# Patient Record
Sex: Female | Born: 1955 | Race: White | Hispanic: No | Marital: Married | Smoking: Never smoker
Health system: Southern US, Community
[De-identification: ages and names within clinical notes are randomized; demographics above are authoritative.]

## PROBLEM LIST (undated history)

## (undated) DIAGNOSIS — M1990 Unspecified osteoarthritis, unspecified site: Secondary | ICD-10-CM

---
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
## 1978-08-08 HISTORY — PX: TONSILLECTOMY: SUR1361

## 1988-08-08 HISTORY — PX: DILATION AND CURETTAGE OF UTERUS: SHX78

## 2008-01-07 ENCOUNTER — Other Ambulatory Visit: Admission: RE | Admit: 2008-01-07 | Discharge: 2008-01-07 | Payer: Self-pay | Admitting: Family Medicine

## 2008-01-07 ENCOUNTER — Encounter: Payer: Self-pay | Admitting: Family Medicine

## 2008-01-07 ENCOUNTER — Ambulatory Visit: Payer: Self-pay | Admitting: Family Medicine

## 2008-01-07 DIAGNOSIS — M48061 Spinal stenosis, lumbar region without neurogenic claudication: Secondary | ICD-10-CM | POA: Insufficient documentation

## 2008-01-07 DIAGNOSIS — M545 Low back pain: Secondary | ICD-10-CM

## 2008-01-07 DIAGNOSIS — M5136 Other intervertebral disc degeneration, lumbar region: Secondary | ICD-10-CM | POA: Insufficient documentation

## 2008-01-10 ENCOUNTER — Encounter: Admission: RE | Admit: 2008-01-10 | Discharge: 2008-01-10 | Payer: Self-pay | Admitting: Family Medicine

## 2008-01-16 ENCOUNTER — Telehealth: Payer: Self-pay | Admitting: Family Medicine

## 2008-01-24 ENCOUNTER — Encounter: Payer: Self-pay | Admitting: Family Medicine

## 2008-01-24 LAB — CONVERTED CEMR LAB
ALT: <8 units/L (ref 0–35)
CO2: 28 meq/L (ref 19–32)
Cholesterol: 161 mg/dL (ref 0–200)
Creatinine, Ser: 0.68 mg/dL (ref 0.40–1.20)
Total Bilirubin: 0.6 mg/dL (ref 0.3–1.2)
Total CHOL/HDL Ratio: 2.3
Triglycerides: 78 mg/dL (ref <150)
VLDL: 16 mg/dL (ref 0–40)

## 2008-01-28 ENCOUNTER — Encounter: Payer: Self-pay | Admitting: Family Medicine

## 2008-12-18 ENCOUNTER — Ambulatory Visit: Payer: Self-pay | Admitting: Family Medicine

## 2009-02-18 ENCOUNTER — Ambulatory Visit: Payer: Self-pay | Admitting: Family Medicine

## 2009-02-18 ENCOUNTER — Encounter: Admission: RE | Admit: 2009-02-18 | Discharge: 2009-02-18 | Payer: Self-pay | Admitting: Family Medicine

## 2009-02-18 ENCOUNTER — Other Ambulatory Visit: Admission: RE | Admit: 2009-02-18 | Discharge: 2009-02-18 | Payer: Self-pay | Admitting: Family Medicine

## 2009-02-18 ENCOUNTER — Encounter: Payer: Self-pay | Admitting: Family Medicine

## 2009-02-19 ENCOUNTER — Encounter: Payer: Self-pay | Admitting: Family Medicine

## 2009-02-22 LAB — CONVERTED CEMR LAB
Alkaline Phosphatase: 77 units/L (ref 39–117)
BUN: 21 mg/dL (ref 6–23)
Creatinine, Ser: 0.72 mg/dL (ref 0.40–1.20)
Glucose, Bld: 95 mg/dL (ref 70–99)
HDL: 65 mg/dL (ref >39)
LDL Cholesterol: 100 mg/dL — ABNORMAL HIGH (ref 0–99)
Sodium: 143 meq/L (ref 135–145)
Total Bilirubin: 0.4 mg/dL (ref 0.3–1.2)
Total CHOL/HDL Ratio: 2.8
Triglycerides: 97 mg/dL (ref <150)
VLDL: 19 mg/dL (ref 0–40)

## 2009-02-23 ENCOUNTER — Encounter: Admission: RE | Admit: 2009-02-23 | Discharge: 2009-02-23 | Payer: Self-pay | Admitting: Family Medicine

## 2009-03-17 ENCOUNTER — Encounter: Payer: Self-pay | Admitting: Family Medicine

## 2009-03-18 ENCOUNTER — Ambulatory Visit: Payer: Self-pay | Admitting: Obstetrics & Gynecology

## 2009-03-19 ENCOUNTER — Encounter: Admission: RE | Admit: 2009-03-19 | Discharge: 2009-03-19 | Payer: Self-pay | Admitting: Obstetrics & Gynecology

## 2009-03-25 ENCOUNTER — Ambulatory Visit: Payer: Self-pay | Admitting: Obstetrics & Gynecology

## 2009-07-12 ENCOUNTER — Ambulatory Visit: Payer: Self-pay | Admitting: Family Medicine

## 2010-03-09 ENCOUNTER — Ambulatory Visit: Payer: Self-pay | Admitting: Obstetrics & Gynecology

## 2010-03-16 ENCOUNTER — Encounter: Admission: RE | Admit: 2010-03-16 | Discharge: 2010-03-16 | Payer: Self-pay | Admitting: Obstetrics & Gynecology

## 2010-05-27 ENCOUNTER — Encounter: Payer: Self-pay | Admitting: Family Medicine

## 2010-06-01 ENCOUNTER — Ambulatory Visit: Payer: Self-pay | Admitting: Family Medicine

## 2010-06-01 ENCOUNTER — Encounter: Admission: RE | Admit: 2010-06-01 | Discharge: 2010-06-01 | Payer: Self-pay | Admitting: Family Medicine

## 2010-06-02 ENCOUNTER — Encounter: Payer: Self-pay | Admitting: Family Medicine

## 2010-06-03 LAB — CONVERTED CEMR LAB
AST: 18 units/L (ref 0–37)
Albumin: 4.4 g/dL (ref 3.5–5.2)
Alkaline Phosphatase: 83 units/L (ref 39–117)
HDL: 56 mg/dL (ref >39)
LDL Cholesterol: 100 mg/dL — ABNORMAL HIGH (ref 0–99)
MCHC: 32 g/dL (ref 30.0–36.0)
Potassium: 4.6 meq/L (ref 3.5–5.3)
RDW: 13.6 % (ref 11.5–15.5)
Sodium: 141 meq/L (ref 135–145)
TSH: 1.154 microintl units/mL (ref 0.350–4.500)
Total Bilirubin: 0.5 mg/dL (ref 0.3–1.2)
Total Protein: 6.6 g/dL (ref 6.0–8.3)
VLDL: 27 mg/dL (ref 0–40)

## 2010-08-12 ENCOUNTER — Ambulatory Visit
Admission: RE | Admit: 2010-08-12 | Discharge: 2010-08-12 | Payer: Self-pay | Source: Home / Self Care | Attending: Family Medicine | Admitting: Family Medicine

## 2010-08-12 DIAGNOSIS — R519 Headache, unspecified: Secondary | ICD-10-CM | POA: Insufficient documentation

## 2010-08-12 DIAGNOSIS — R51 Headache: Secondary | ICD-10-CM | POA: Insufficient documentation

## 2010-09-07 NOTE — Assessment & Plan Note (Signed)
Summary: CPE   Vital Signs:  Patient profile:   55 year old female Height:      64.5 inches Weight:      185 pounds BMI:     31.38 Pulse rate:   98 / minute BP sitting:   126 / 81  (right arm) Cuff size:   regular  Vitals Entered By: Avon Gully CMA, Duncan Dull) (June 01, 2010 2:05 PM) CC: CPE   CC:  CPE.  Current Medications (verified): 1)  One A Day Vitamin .... Once A Day 2)  Alieve .... Once A Day 3)  Calcium + Vitamin D .... Once A Day 4)  Fish Oil .... Once A Day  Allergies (verified): No Known Drug Allergies  Comments:  Nurse/Medical Assistant: The patient's medications and allergies were reviewed with the patient and were updated in the Medication and Allergy Lists. Avon Gully CMA, Duncan Dull) (June 01, 2010 2:05 PM)  Past History:  Past Medical History: Last updated: 12/18/2008 None Unremarkable  Past Surgical History: Last updated: 01/07/2008 Appendectomy 1968 Tonsillectomy 1981 D & C miscarriage 1990  Family History: Reviewed history from 12/18/2008 and no changes required. 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  Married to [employer] with teenage daughter.   Never Smoked Alcohol use-yes Drug use-no Caffeine - occassionally but not daily No regular exercise.   Review of Systems       CP intermittant for the last 8 month.  Get a sense of struggling to breath (like a panic attack) and then feels nervous. Never had panic attacks. Chest will feel uncomfortable but not painful. Has happened twice. Lasted maybe 10 min. Once happened at night and woke her up and once while hiking.  Feels she is overwight and out of shape.   Physical Exam  General:  Well-developed,well-nourished,in no acute distress; alert,appropriate and cooperative throughout examination Head:  Normocephalic and atraumatic without obvious abnormalities.  No apparent alopecia or balding. Eyes:  No corneal or conjunctival inflammation noted. EOMI. Perrla.  Ears:  External ear exam shows no significant lesions or deformities.  Otoscopic examination reveals clear canals, tympanic membranes are intact bilaterally without bulging, retraction, inflammation or discharge. Hearing is grossly normal bilaterally. Nose:  External nasal examination shows no deformity or inflammation. Nasal mucosa are pink and moist without lesions or exudates. Mouth:  Oral mucosa and oropharynx without lesions or exudates.  Teeth in good repair. Neck:  No deformities, masses, or tenderness noted. Chest Wall:  No deformities, masses, or tenderness noted. Lungs:  Normal respiratory effort, chest expands symmetrically. Lungs are clear to auscultation, no crackles or wheezes. Heart:  Normal rate and regular rhythm. S1 and S2 normal without gallop, murmur, click, rub or other extra sounds. Abdomen:  Bowel sounds positive,abdomen soft and non-tender without masses, organomegaly or hernias noted. Msk:  No deformity or scoliosis noted of thoracic or lumbar spine.   Pulses:  R and L carotid,radial,dorsalis pedis and posterior tibial pulses are full and equal bilaterally Extremities:  No clubbing, cyanosis, edema, or deformity noted with normal full range of motion of all joints.   Neurologic:  No cranial nerve deficits noted. Station and gait are normal. DTRs are symmetrical throughout. Sensory, motor and coordinative functions appear intact. Skin:  no rashes.   Cervical Nodes:  No lymphadenopathy noted Psych:  Cognition and judgment appear intact. Alert and cooperative with normal attention span and concentration.  No apparent delusions, illusions, hallucinations   Impression & Recommendations:  Problem # 1:  PHYSICAL EXAMINATION (ICD-V70.0) Exam is normal today. Encouraged more regular exercise Due for screening labs.  Her mammo and pelvic exam are upt to date.   Orders: T-Comprehensive Metabolic Panel [id number]) T-Lipid Profile [id number]) T-TSH [phone])  Problem # 2:  CHEST PAIN, ATYPICAL (ICD-786.59) EKG is very reassuring. Rate of 63 bpm, no acute changes. Likely noncardiac. Does sound more consistant with axiety. Very low risk for heart disease.   Call if occurs again.  Orders: T-Comprehensive Metabolic Panel [id number]) T-Lipid Profile [phone]) T-TSH [id number]) EKG w/ Interpretation (93000) T-CBC No Diff ([id number])  Complete Medication List: 1)  One A Day Vitamin  .... Once a day 2)  Alieve  .... Once a day 3)  Calcium + Vitamin D  .... Once a day 4)  Fish Oil  .... Once a day  Other Orders: T-Dual DXA Bone Density/ Axial ([id number])  Patient Instructions: 1)  It is important that you exercise reguarly at least 20 minutes 5 times a week. If you develop chest pain, have severe difficulty breathing, or feel very tired, stop exercising immediately and seek medical attention.  2)  Continue your daily calcium.   Contraindications/Deferment of Procedures/Staging:    Test/Procedure: FLU VAX    Reason for deferment: patient declined    Orders Added: 1)  Est. Patient age 62-64 [60] 2)  Est. Patient Level II [99212] 3)  T-Comprehensive Metabolic Panel [[id number]] 4)  T-Lipid Profile [[id number]] 5)  T-TSH [[id number]] 6)  EKG w/ Interpretation [93000] 7)  T-Dual DXA Bone Density/ Axial [77080] 8)  T-CBC No Diff [[id number]]    Flex Sig Next Due:  Not Indicated Hemoccult Next Due:  Not Indicated Last Mammogram:  ASSESSMENT: Negative - BI-RADS 1^MM DIGITAL SCREENING (03/16/2010 1:24:00 PM) Mammogram Result Date:  03/16/2010 Mammogram Result:  normal Mammogram Next Due:  1 yr

## 2010-09-09 NOTE — Assessment & Plan Note (Signed)
Summary: Headache, nausea   Vital Signs:  Patient profile:   55 year old female Height:      64.5 inches Weight:      186 pounds Temp:     97.4 degrees F oral Pulse rate:   55 / minute BP sitting:   124 / 79  (right arm) Cuff size:   regular  Vitals Entered By: Avon Gully CMA, Duncan Dull) (August 12, 2010 2:31 PM) CC: HA on left side since tues, neck pain on left side , dizziness, nausea, Headache   Primary Care Provider:  Nani Gasser MD  CC:  HA on left side since tues, neck pain on left side , dizziness, nausea, and Headache.  History of Present Illness: HA on left side since tues, neck pain on left side ,dizziness, nausea. NO vomiting. Pain is more pressure.  No nasal congestion. Pain is over the left eye and ear and into her neck. NO injury.  No fever.  Hx of migraines but hasn't had one in about 25 years.  Yesterday had vertigo, today is some better. Started when she tried ot get out of bed.  Took Advil yesteray and it didn't help. Hx of meneires years ago.  No hearing loss.   Current Medications (verified): 1)  One A Day Vitamin .... Once A Day 2)  Alieve .... Once A Day 3)  Calcium-Vitamin D 500-200 Mg-Unit Tabs (Calcium Carbonate-Vitamin D) .... Take 1 Tablet By Mouth Two Times A Day 4)  Fish Oil .... Once A Day  Allergies (verified): No Known Drug Allergies  Comments:  Nurse/Medical Assistant: The patient's medications and allergies were reviewed with the patient and were updated in the Medication and Allergy Lists. 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  Married to [employer] with  teenage daughter.   Never Smoked Alcohol use-yes Drug use-no Caffeine - occassionally but not daily No regular exercise.   Physical Exam  General:  Well-developed,well-nourished,in no acute distress; alert,appropriate and cooperative throughout examination Head:  Normocephalic and atraumatic without obvious abnormalities. No apparent alopecia or balding. Eyes:  No corneal or conjunctival inflammation noted. EOMI. Perrla. Ears:  External ear exam shows no significant lesions or deformities.  Otoscopic examination reveals clear canals, tympanic membranes are intact bilaterally without bulging, retraction, inflammation or discharge. Hearing is grossly normal bilaterally. Nose:  External nasal examination shows no deformity or inflammation.  Mouth:  Oral mucosa and oropharynx without lesions or exudates.  Teeth in good repair. Neck:  No deformities, masses, or tenderness noted. Lungs:  Normal respiratory effort, chest expands symmetrically. Lungs are clear to auscultation, no crackles or wheezes. Heart:  Normal rate and regular rhythm. S1 and S2 normal without gallop, murmur, click, rub or other extra sounds. Skin:  no rashes.   Cervical Nodes:  No lymphadenopathy noted Psych:  Cognition and judgment appear intact. Alert and cooperative with normal attention span and concentration. No apparent delusions, illusions, hallucinations   Impression & Recommendations:  Problem # 1:  HEADACHE (ICD-784.0) Alsmost sounds like a migraine except has the vertigo with it. Not menieres as she has not had hearing loss with it. Also not just BPV.  Neg  Diks-Hallpike and her vertigo is actually better. I will given her an antibiotic and tx her for sinusitis. If not better by next week please call the office and let me know. If needs somthing stronger for pain then let me know.  Ear appears normal but if starts draining or fever then let me know.  Consider vertiginous migraine.   Problem # 2:  VERTIGO  (ICD-780.4) Patient to call to be seen if no improvement in 10-14 days, sooner if worse.   Complete Medication List: 1)  One A Day Vitamin  .... Once a day 2)  Alieve  .... Once a day 3)  Calcium-vitamin D 500-200 Mg-unit Tabs (Calcium carbonate-vitamin d) .... Take 1 tablet by mouth two times a day 4)  Fish Oil  .... Once a day 5)  Amoxicillin 875 Mg Tabs (Amoxicillin) .... Take 1 tablet by mouth two times a day for 10 day  Patient Instructions: 1)  Possibly vestibulitis 2)  Call if not better in 1-2 weeks.  Prescriptions: AMOXICILLIN 875 MG TABS (AMOXICILLIN) Take 1 tablet by mouth two times a day for 10 day  #20 x 0   Entered and Authorized by:   Nani Gasser MD   Signed by:   Nani Gasser MD on 08/12/2010   Method used:   Electronically to        [facility] [phone]* (retail)       [address] [place], [place]  [address]       Ph: [phone]       Fax: [phone]   RxID:   [id number]    Orders Added: 1)  Est. Patient Level IV [27253]

## 2010-12-21 NOTE — Assessment & Plan Note (Signed)
Becky Bowers, Becky Bowers              ACCOUNT NO.:  [id number]   MEDICAL RECORD NO.:  [id number]          PATIENT TYPE:  POB   LOCATION:  [facility]         FACILITY:  [facility]   PHYSICIAN:  Allie Bossier, MD        DATE OF BIRTH:  [birth year]   DATE OF SERVICE:                                  CLINIC NOTE   HISTORY OF PRESENT ILLNESS:  Becky Bowers is a 55 year old married white  G2, P1, A1.  She has a 31 year old daughter.  She comes in today for  annual exam.  She has no GYN complaints.   PAST MEDICAL HISTORY:  None.   MEDICATIONS:  None.   REVIEW OF SYSTEMS:  She is married for the last 23 years and she and her  husband (who has had 2 heart attacks) no longer have sex.  She says this  is not an issue for their relationship.  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  She was menopausal at age 17 and is  asymptomatic.  Her last mammogram was July of last year and her last Pap  smear was July of last year at which time endometrial cells were seen  and she had a negative endometrial biopsy done here in August 2010.   PAST SURGICAL HISTORY:  She had a D and C after her miscarriage.  She  has had an appendectomy and tonsillectomy.   FAMILY HISTORY:  Negative for breast and colon malignancy but a paternal  grandmother did have colon cancer.   PHYSICAL EXAMINATION:  GENERAL/VITAL SIGNS:  Well-nourished, well-  hydrated pleasant white female, height 5 feet 6 inches, weight 183,  blood pressure 123/78, pulse 67.  HEENT:  Normal.  HEART:  Regular rate and rhythm.  LUNGS:  Clear to auscultation bilaterally.  Breasts exam normal  bilaterally.  ABDOMEN:  No palpable hepatosplenomegaly.  EXTERNAL GENITALIA:  No lesions.  Cervix parous, no lesions.  Uterus  normal size and shape, anteverted.  Adnexa nontender.  No masses.   ASSESSMENT/PLAN:  Annual exam.  Recommended self-breast and self-vulvar  exams monthly.  I am scheduling a mammogram after a Pap smear today.  I  have also recommended weight loss and she agrees.      Allie Bossier, MD    MCD/MEDQ  D:  03/09/2010  T:  03/10/2010  Job:  [id number]

## 2011-03-29 ENCOUNTER — Other Ambulatory Visit: Payer: Self-pay | Admitting: Family Medicine

## 2011-03-29 ENCOUNTER — Other Ambulatory Visit: Payer: Self-pay | Admitting: Obstetrics & Gynecology

## 2011-03-29 DIAGNOSIS — Z1231 Encounter for screening mammogram for malignant neoplasm of breast: Secondary | ICD-10-CM

## 2011-04-06 ENCOUNTER — Ambulatory Visit: Payer: Self-pay

## 2011-04-19 ENCOUNTER — Ambulatory Visit
Admission: RE | Admit: 2011-04-19 | Discharge: 2011-04-19 | Disposition: A | Discharge status: Home / Self Care | Payer: BC Managed Care – PPO | Source: Ambulatory Visit | Attending: Family Medicine | Admitting: Family Medicine

## 2011-04-19 DIAGNOSIS — Z1231 Encounter for screening mammogram for malignant neoplasm of breast: Secondary | ICD-10-CM

## 2011-04-25 ENCOUNTER — Telehealth: Payer: Self-pay | Admitting: *Deleted

## 2011-04-25 NOTE — Telephone Encounter (Signed)
Message copied by Lanae Crumbly on Mon Apr 25, 2011  9:04 AM ------      Message from: Nani Gasser D      Created: Thu Apr 21, 2011  5:05 PM       Please call patient. Normal mammogram.  Repeat in 1 year.

## 2011-04-25 NOTE — Telephone Encounter (Signed)
Pt notified of reults. KJ LPN

## 2011-05-04 ENCOUNTER — Ambulatory Visit (INDEPENDENT_AMBULATORY_CARE_PROVIDER_SITE_OTHER): Payer: BC Managed Care – PPO | Admitting: Obstetrics & Gynecology

## 2011-05-04 ENCOUNTER — Encounter: Payer: Self-pay | Admitting: Obstetrics & Gynecology

## 2011-05-04 VITALS — BP 110/66 | HR 53 | Ht 66.0 in | Wt 135.0 lb

## 2011-05-04 DIAGNOSIS — K629 Disease of anus and rectum, unspecified: Secondary | ICD-10-CM

## 2011-05-04 DIAGNOSIS — Z113 Encounter for screening for infections with a predominantly sexual mode of transmission: Secondary | ICD-10-CM

## 2011-05-04 DIAGNOSIS — Z1272 Encounter for screening for malignant neoplasm of vagina: Secondary | ICD-10-CM

## 2011-05-04 DIAGNOSIS — K6289 Other specified diseases of anus and rectum: Secondary | ICD-10-CM

## 2011-05-04 DIAGNOSIS — Z0000 Encounter for general adult medical examination without abnormal findings: Secondary | ICD-10-CM

## 2011-05-04 NOTE — Progress Notes (Signed)
  Subjective:    Patient ID: Becky Bowers, female    DOB: 1956/05/29, 55 y.o.   MRN: [id number]  HPI    Review of Systems     Objective:   Physical Exam        Assessment & Plan:    Subjective:    Becky Bowers is a 55 y.o. female who presents for annual exam. She complains that a "painful bump"arrived on her perineum this past week. The patient is sexually active. GYN screening history: last pap: was normal. The patient is not taking hormone replacement therapy. Patient denies post-menopausal vaginal bleeding.. The patient wears seatbelts: yes. The patient participates in regular exercise: yes. Has the patient ever been transfused or tattooed?: no. The patient reports that there is not domestic violence in her life.   Menstrual History: OB History    Grav Para Term Preterm Abortions TAB SAB Ect Mult Living                  Menarche age: 69 No LMP recorded. Patient is postmenopausal.    The following portions of the patient's history were reviewed and updated as appropriate: allergies, current medications, past family history, past medical history, past social history, past surgical history and problem list.  Review of Systems A comprehensive review of systems was negative.    Objective:    BP 110/66  Pulse 53  Ht 5\' 6"  (1.676 m)  Wt 135 lb (61.236 kg)  BMI 21.79 kg/m2  General Appearance:    Alert, cooperative, no distress, appears stated age  Head:    Normocephalic, without obvious abnormality, atraumatic  Eyes:    PERRL, conjunctiva/corneas clear, EOM's intact, fundi    benign, both eyes  Ears:    Normal TM's and external ear canals, both ears  Nose:   Nares normal, septum midline, mucosa normal, no drainage    or sinus tenderness  Throat:   Lips, mucosa, and tongue normal; teeth and gums normal  Neck:   Supple, symmetrical, trachea midline, no adenopathy;    thyroid:  no enlargement/tenderness/nodules; no carotid   bruit or JVD  Back:     Symmetric,  no curvature, ROM normal, no CVA tenderness  Lungs:     Clear to auscultation bilaterally, respirations unlabored  Chest Wall:    No tenderness or deformity   Heart:    Regular rate and rhythm, S1 and S2 normal, no murmur, rub   or gallop  Breast Exam:    No tenderness, masses, or nipple abnormality  Abdomen:     Soft, non-tender, bowel sounds active all four quadrants,    no masses, no organomegaly  Genitalia:    Normal female without lesion, discharge or tenderness, moderate atrophy, NSSA, NT, mobile uterus, no adnexal masses  Rectal:    There is a 1 cm area of erythema/ulceration that is tender, located in the anterior perianal area  Extremities:   Extremities normal, atraumatic, no cyanosis or edema  Pulses:   2+ and symmetric all extremities  Skin:   Skin color, texture, turgor normal, no rashes or lesions  Lymph nodes:   Cervical, supraclavicular, and axillary nodes normal  Neurologic:   CNII-XII intact, normal strength, sensation and reflexes    throughout      Assessment:    Normal gyn exam   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  Check viral titres

## 2011-05-05 LAB — HSV 1 ANTIBODY, IGG: HSV 1 Glycoprotein G Ab, IgG: 36.77 IV — ABNORMAL HIGH

## 2011-05-09 ENCOUNTER — Telehealth: Payer: Self-pay | Admitting: *Deleted

## 2011-05-09 MED ORDER — VALACYCLOVIR HCL 1 G PO TABS: 1000.0000 mg | ORAL_TABLET | Freq: Every day | ORAL | Status: DC

## 2011-05-09 NOTE — Telephone Encounter (Signed)
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  Per VO Dr Marice Potter Valtrex 1 GM po x 7 days.  This Rx was escribed to [facility] in [place].

## 2011-06-09 ENCOUNTER — Encounter: Payer: Self-pay | Admitting: Family Medicine

## 2011-06-14 ENCOUNTER — Ambulatory Visit
Admission: RE | Admit: 2011-06-14 | Discharge: 2011-06-14 | Disposition: A | Discharge status: Home / Self Care | Payer: BC Managed Care – PPO | Source: Ambulatory Visit | Attending: Family Medicine | Admitting: Family Medicine

## 2011-06-14 ENCOUNTER — Ambulatory Visit (INDEPENDENT_AMBULATORY_CARE_PROVIDER_SITE_OTHER): Payer: BC Managed Care – PPO | Admitting: Family Medicine

## 2011-06-14 ENCOUNTER — Encounter: Payer: Self-pay | Admitting: Family Medicine

## 2011-06-14 VITALS — BP 88/56 | HR 60 | Wt 137.0 lb

## 2011-06-14 DIAGNOSIS — M545 Low back pain, unspecified: Secondary | ICD-10-CM

## 2011-06-14 DIAGNOSIS — Z0000 Encounter for general adult medical examination without abnormal findings: Secondary | ICD-10-CM

## 2011-06-14 DIAGNOSIS — M25559 Pain in unspecified hip: Secondary | ICD-10-CM

## 2011-06-14 DIAGNOSIS — M25551 Pain in right hip: Secondary | ICD-10-CM

## 2011-06-14 NOTE — Progress Notes (Signed)
Subjective:     Becky Bowers is a 55 y.o. female and is here for a comprehensive physical exam. The patient reports no problems. Occ right hp pain near the hip crease and will occ feel a catch when she works out. That is more recent. Overall pain has been there for years. Has been seeing a chiropracter for this and her low back for the last 2 year.  Has seen her gyn and had her pap and mammo already.   History   Social History  . Marital Status: Married    Spouse Name: N/A    Number of Children: 1  . Years of Education: N/A   Occupational History  . Retired Economist    Social History Main Topics  . Smoking status: Never Smoker   . Smokeless tobacco: Never Used  . Alcohol Use: 1.2 oz/week    2 Glasses of wine per week  . Drug Use: No  . 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   Other Topics Concern  . Not on file   Social History Narrative   No caffeine. 5 das per week exercising.    Health Maintenance  Topic Date Due  . Colonoscopy  12/27/2006  . Influenza Vaccine  05/08/2012  . Mammogram  04/18/2013  . Pap Smear  05/03/2014  . Tetanus/tdap  02/19/2019    The following portions of the patient's history were reviewed and updated as appropriate: allergies, current medications, past family history, past medical history, past social history, past surgical history and problem list.  Review of Systems A comprehensive review of systems was negative.   Objective:    BP 88/56  Pulse 60  Wt 137 lb (62.143 kg) General appearance: alert, cooperative and appears stated age Head: Normocephalic, without obvious abnormality, atraumatic Eyes: conjunctivae/corneas clear. PERRL, EOM's intact. Fundi benign. Ears: normal TM's and external ear canals both ears Nose: Nares normal. Septum midline. Mucosa normal. No drainage or sinus tenderness. Throat: lips, mucosa, and tongue  normal; teeth and gums normal Neck: no adenopathy, no carotid bruit, no JVD, supple, symmetrical, trachea midline and thyroid not enlarged, symmetric, no tenderness/mass/nodules Back: symmetric, no curvature. ROM normal. No CVA tenderness. Lungs: clear to auscultation bilaterally Heart: regular rate and rhythm, S1, S2 normal, no murmur, click, rub or gallop Abdomen: soft, non-tender; bowel sounds normal; no masses,  no organomegaly Pelvic: not performed.  Extremities: extremities normal, atraumatic, no cyanosis or edema Pulses: 2+ and symmetric Skin: Skin color, texture, turgor normal. No rashes or lesions Lymph nodes: Cervical, supraclavicular, and axillary nodes normal. Neurologic: Grossly normal  MSK: right hip with NROM, strength 5/5. Pain with internation rotation in her low back but not in the hip.     Assessment:    Healthy female exam.   Plan:     See After Visit Summary for Counseling Recommendations  Start a regular exercise program and make sure you are eating a healthy diet Try to eat 4 servings of dairy a day or take a calcium supplement (500mg  twice a day). Your vaccines are up to date.  Will call with lab results.   Right Hip Pain-Will get xray to see degree of arthritis. She has hit a standstill with chiropractic care and is not really getting any better. Consider refer to ortho for further eval and possible injection. Thought some of her hip pain may be coming from her back.  Low Back Pain - Will get xray as well.

## 2011-06-14 NOTE — Patient Instructions (Signed)
Start a regular exercise program and make sure you are eating a healthy diet Try to eat 4 servings of dairy a day or take a calcium supplement (500mg twice a day). Your vaccines are up to date.   

## 2011-06-16 LAB — COMPLETE METABOLIC PANEL WITH GFR
ALT: <8 U/L (ref 0–35)
AST: 18 U/L (ref 0–37)
Alkaline Phosphatase: 81 U/L (ref 39–117)
CO2: 30 mEq/L (ref 19–32)
Creat: 0.74 mg/dL (ref 0.50–1.10)
GFR, Est African American: >89 mL/min (ref >89)
Sodium: 142 mEq/L (ref 135–145)
Total Bilirubin: 0.5 mg/dL (ref 0.3–1.2)
Total Protein: 6.7 g/dL (ref 6.0–8.3)

## 2011-06-16 LAB — LIPID PANEL
HDL: 63 mg/dL (ref >39)
LDL Cholesterol: 96 mg/dL (ref 0–99)
VLDL: 11 mg/dL (ref 0–40)

## 2011-06-21 ENCOUNTER — Telehealth: Payer: Self-pay | Admitting: *Deleted

## 2011-06-21 DIAGNOSIS — M25551 Pain in right hip: Secondary | ICD-10-CM

## 2011-06-21 NOTE — Telephone Encounter (Signed)
Order place for hip pain.

## 2011-06-21 NOTE — Telephone Encounter (Signed)
Pt needs ortho referral

## 2011-07-11 ENCOUNTER — Ambulatory Visit: Payer: BC Managed Care – PPO | Attending: Sports Medicine | Admitting: Physical Therapy

## 2011-07-11 DIAGNOSIS — M545 Low back pain, unspecified: Secondary | ICD-10-CM | POA: Insufficient documentation

## 2011-07-11 DIAGNOSIS — M6281 Muscle weakness (generalized): Secondary | ICD-10-CM | POA: Insufficient documentation

## 2011-07-11 DIAGNOSIS — IMO0001 Reserved for inherently not codable concepts without codable children: Secondary | ICD-10-CM | POA: Insufficient documentation

## 2011-07-13 ENCOUNTER — Ambulatory Visit: Payer: BC Managed Care – PPO | Admitting: Physical Therapy

## 2011-07-15 ENCOUNTER — Ambulatory Visit: Payer: BC Managed Care – PPO | Admitting: Physical Therapy

## 2011-07-19 ENCOUNTER — Ambulatory Visit: Payer: BC Managed Care – PPO | Admitting: Physical Therapy

## 2011-07-21 ENCOUNTER — Ambulatory Visit: Payer: BC Managed Care – PPO | Admitting: Physical Therapy

## 2011-07-26 ENCOUNTER — Ambulatory Visit: Payer: BC Managed Care – PPO | Admitting: Physical Therapy

## 2011-07-28 ENCOUNTER — Ambulatory Visit: Payer: BC Managed Care – PPO | Admitting: Physical Therapy

## 2011-08-03 ENCOUNTER — Ambulatory Visit: Payer: BC Managed Care – PPO | Admitting: Physical Therapy

## 2011-08-05 ENCOUNTER — Ambulatory Visit: Payer: BC Managed Care – PPO | Admitting: Physical Therapy

## 2011-08-10 ENCOUNTER — Encounter: Payer: BC Managed Care – PPO | Admitting: Physical Therapy

## 2011-08-11 ENCOUNTER — Ambulatory Visit: Payer: BC Managed Care – PPO | Attending: Sports Medicine | Admitting: Physical Therapy

## 2011-08-11 DIAGNOSIS — IMO0001 Reserved for inherently not codable concepts without codable children: Secondary | ICD-10-CM | POA: Insufficient documentation

## 2011-08-11 DIAGNOSIS — M545 Low back pain, unspecified: Secondary | ICD-10-CM | POA: Insufficient documentation

## 2011-08-11 DIAGNOSIS — M6281 Muscle weakness (generalized): Secondary | ICD-10-CM | POA: Insufficient documentation

## 2011-09-05 ENCOUNTER — Other Ambulatory Visit: Payer: Self-pay

## 2011-09-05 MED ORDER — VALACYCLOVIR HCL 1 G PO TABS: 1000.0000 mg | ORAL_TABLET | Freq: Every day | ORAL | Status: DC

## 2011-09-05 NOTE — Telephone Encounter (Signed)
Pt called with request of RF on Valtrex be sent to [facility] [address].

## 2012-05-17 ENCOUNTER — Other Ambulatory Visit: Payer: Self-pay | Admitting: Family Medicine

## 2012-05-17 DIAGNOSIS — Z1231 Encounter for screening mammogram for malignant neoplasm of breast: Secondary | ICD-10-CM

## 2012-06-14 ENCOUNTER — Ambulatory Visit (INDEPENDENT_AMBULATORY_CARE_PROVIDER_SITE_OTHER): Payer: BC Managed Care – PPO | Admitting: Obstetrics & Gynecology

## 2012-06-14 ENCOUNTER — Encounter: Payer: Self-pay | Admitting: Obstetrics & Gynecology

## 2012-06-14 ENCOUNTER — Ambulatory Visit (INDEPENDENT_AMBULATORY_CARE_PROVIDER_SITE_OTHER): Payer: BC Managed Care – PPO | Admitting: Family Medicine

## 2012-06-14 ENCOUNTER — Ambulatory Visit (INDEPENDENT_AMBULATORY_CARE_PROVIDER_SITE_OTHER): Payer: BC Managed Care – PPO

## 2012-06-14 ENCOUNTER — Encounter: Payer: Self-pay | Admitting: Family Medicine

## 2012-06-14 VITALS — BP 104/58 | HR 53 | Ht 65.0 in | Wt 127.0 lb

## 2012-06-14 VITALS — BP 109/64 | HR 54 | Temp 97.1°F | Resp 16 | Ht 66.0 in | Wt 126.0 lb

## 2012-06-14 DIAGNOSIS — Z1231 Encounter for screening mammogram for malignant neoplasm of breast: Secondary | ICD-10-CM

## 2012-06-14 DIAGNOSIS — Z0000 Encounter for general adult medical examination without abnormal findings: Secondary | ICD-10-CM

## 2012-06-14 DIAGNOSIS — Z01419 Encounter for gynecological examination (general) (routine) without abnormal findings: Secondary | ICD-10-CM

## 2012-06-14 DIAGNOSIS — Z124 Encounter for screening for malignant neoplasm of cervix: Secondary | ICD-10-CM

## 2012-06-14 DIAGNOSIS — N952 Postmenopausal atrophic vaginitis: Secondary | ICD-10-CM

## 2012-06-14 DIAGNOSIS — Z1151 Encounter for screening for human papillomavirus (HPV): Secondary | ICD-10-CM

## 2012-06-14 MED ORDER — ESTRADIOL 10 MCG VA TABS: ORAL_TABLET | VAGINAL | Status: DC

## 2012-06-14 NOTE — Patient Instructions (Signed)
Keep up a regular exercise program and make sure you are eating a healthy diet Try to eat 4 servings of dairy a day, or if you are lactose intolerant take a calcium with vitamin D daily.  Your vaccines are up to date.   

## 2012-06-14 NOTE — Progress Notes (Signed)
Subjective:    Becky Bowers is a 57 y.o. female who presents for an annual exam. She recently returned from a cruise and had more sex than usual. She would like treatment for vaginal atrophy. The patient is sexually active. GYN screening history: last pap: was normal. The patient wears seatbelts: yes. The patient participates in regular exercise: yes. (5 days per week at [employer]) Has the patient ever been transfused or tattooed?: no. The patient reports that there is not domestic violence in her life.   Menstrual History: OB History    Grav Para Term Preterm Abortions TAB SAB Ect Mult Living                  Menarche age: 72 No LMP recorded. Patient is postmenopausal.    The following portions of the patient's history were reviewed and updated as appropriate: allergies, current medications, past family history, past medical history, past social history, past surgical history and problem list.  Review of Systems A comprehensive review of systems was negative. She has been married for 25 years.   Objective:    BP 109/64  Pulse 54  Temp 97.1 F (36.2 C) (Oral)  Resp 16  Ht 5\' 6"  (1.676 m)  Wt 126 lb (57.153 kg)  BMI 20.34 kg/m2  General Appearance:    Alert, cooperative, no distress, appears stated age  Head:    Normocephalic, without obvious abnormality, atraumatic  Eyes:    PERRL, conjunctiva/corneas clear, EOM's intact, fundi    benign, both eyes  Ears:    Normal TM's and external ear canals, both ears  Nose:   Nares normal, septum midline, mucosa normal, no drainage    or sinus tenderness  Throat:   Lips, mucosa, and tongue normal; teeth and gums normal  Neck:   Supple, symmetrical, trachea midline, no adenopathy;    thyroid:  no enlargement/tenderness/nodules; no carotid   bruit or JVD  Back:     Symmetric, no curvature, ROM normal, no CVA tenderness  Lungs:     Clear to auscultation bilaterally, respirations unlabored  Chest Wall:    No tenderness or deformity     Heart:    Regular rate and rhythm, S1 and S2 normal, no murmur, rub   or gallop  Breast Exam:    No tenderness, masses, or nipple abnormality  Abdomen:     Soft, non-tender, bowel sounds active all four quadrants,    no masses, no organomegaly  Genitalia:    Normal female without lesion, discharge or tenderness, moderate to severe vulvovaginal atrophy, stenotic cervix, NSSA, NT, mobile, non-enlarged adnexa     Extremities:   Extremities normal, atraumatic, no cyanosis or edema  Pulses:   2+ and symmetric all extremities  Skin:   Skin color, texture, turgor normal, no rashes or lesions  Lymph nodes:   Cervical, supraclavicular, and axillary nodes normal  Neurologic:   CNII-XII intact, normal strength, sensation and reflexes    throughout  .    Assessment:    Healthy female exam.    Plan:     Mammogram. Pap smear. Vagifem

## 2012-06-14 NOTE — Progress Notes (Signed)
  Subjective:     Becky Bowers is a 56 y.o. female and is here for a comprehensive physical exam. The patient reports no problems.  History   Social History  . Marital Status: Married    Spouse Name: Ramon Dredge.     Number of Children: 1  . Years of Education: N/A   Occupational History  . Retired Economist    Social History Main Topics  . Smoking status: Never Smoker   . Smokeless tobacco: Never Used  . Alcohol Use: 1.2 oz/week    2 Glasses of wine per week  . Drug Use: No  . 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   Other Topics Concern  . Not on file   Social History Narrative   1  Caffeine drink per day. 5 days per week exercising.    Health Maintenance  Topic Date Due  . Influenza Vaccine  04/08/2012  . Colonoscopy  02/02/2014  . Pap Smear  05/03/2014  . Mammogram  06/14/2014  . Tetanus/tdap  01/11/2021    The following portions of the patient's history were reviewed and updated as appropriate: allergies, current medications, past family history, past medical history, past social history, past surgical history and problem list.  Review of Systems A comprehensive review of systems was negative.   Objective:    BP 104/58  Pulse 53  Ht 5\' 5"  (1.651 m)  Wt 127 lb (57.607 kg)  BMI 21.13 kg/m2 General appearance: alert, cooperative and appears stated age Head: Normocephalic, without obvious abnormality, atraumatic Eyes: fonj clear, EOMi, PEERLA Ears: normal TM's and external ear canals both ears Nose: Nares normal. Septum midline. Mucosa normal. No drainage or sinus tenderness. Throat: lips, mucosa, and tongue normal; teeth and gums normal Neck: no adenopathy, no carotid bruit, no JVD, supple, symmetrical, trachea midline and thyroid not enlarged, symmetric, no tenderness/mass/nodules Back: symmetric, no curvature. ROM normal. No CVA tenderness. Lungs:  clear to auscultation bilaterally Heart: regular rate and rhythm, S1, S2 normal, no murmur, click, rub or gallop Abdomen: soft, non-tender; bowel sounds normal; no masses,  no organomegaly Extremities: extremities normal, atraumatic, no cyanosis or edema Pulses: 2+ and symmetric Skin: Skin color, texture, turgor normal. No rashes or lesions Lymph nodes: Cervical adenopathy: none Neurologic: Alert and oriented X 3, normal strength and tone. Normal symmetric reflexes. Normal coordination and gait    Assessment:    Healthy female exam.      Plan:     See After Visit Summary for Counseling Recommendations  Keep up a regular exercise program and make sure you are eating a healthy diet Try to eat 4 servings of dairy a day, or if you are lactose intolerant take a calcium with vitamin D daily.  Your vaccines are up to date.  Eye exam is up-to-date. Her lenses are up-to-date. She has her mammogram scheduled for today at 1:00. She has her pelvic exam scheduled today at 2:00. We did have a discussion about calcium and vitamin D.

## 2012-06-15 LAB — CBC
HCT: 39.2 % (ref 36.0–46.0)
Hemoglobin: 13 g/dL (ref 12.0–15.0)
MCHC: 33.2 g/dL (ref 30.0–36.0)
MCV: 88.1 fL (ref 78.0–100.0)
RDW: 14.2 % (ref 11.5–15.5)
WBC: 5.1 10*3/uL (ref 4.0–10.5)

## 2012-06-15 LAB — COMPLETE METABOLIC PANEL WITH GFR
CO2: 29 mEq/L (ref 19–32)
Creat: 0.82 mg/dL (ref 0.50–1.10)
GFR, Est African American: >89 mL/min
GFR, Est Non African American: 81 mL/min
Glucose, Bld: 79 mg/dL (ref 70–99)
Total Bilirubin: 0.8 mg/dL (ref 0.3–1.2)

## 2012-06-15 LAB — LIPID PANEL
HDL: 84 mg/dL (ref >39)
Triglycerides: 68 mg/dL (ref <150)

## 2012-06-15 NOTE — Progress Notes (Signed)
Quick Note:  All labs are normal. ______ 

## 2012-09-10 ENCOUNTER — Other Ambulatory Visit: Payer: Self-pay | Admitting: *Deleted

## 2012-09-10 DIAGNOSIS — B009 Herpesviral infection, unspecified: Secondary | ICD-10-CM

## 2012-09-10 MED ORDER — VALACYCLOVIR HCL 1 G PO TABS: 1000.0000 mg | ORAL_TABLET | Freq: Every day | ORAL | Status: DC

## 2012-09-10 NOTE — Telephone Encounter (Signed)
Pt called requesting RF on Valtrex that Dr Marice Potter had given her 10/12.  This authorization was sent to [facility] per Dr Marice Potter.

## 2013-06-10 ENCOUNTER — Other Ambulatory Visit: Payer: Self-pay | Admitting: Family Medicine

## 2013-06-10 DIAGNOSIS — Z1231 Encounter for screening mammogram for malignant neoplasm of breast: Secondary | ICD-10-CM

## 2013-06-20 ENCOUNTER — Ambulatory Visit (INDEPENDENT_AMBULATORY_CARE_PROVIDER_SITE_OTHER): Payer: BC Managed Care – PPO | Admitting: Family Medicine

## 2013-06-20 ENCOUNTER — Ambulatory Visit (INDEPENDENT_AMBULATORY_CARE_PROVIDER_SITE_OTHER): Payer: BC Managed Care – PPO | Admitting: Obstetrics & Gynecology

## 2013-06-20 ENCOUNTER — Encounter: Payer: Self-pay | Admitting: Family Medicine

## 2013-06-20 ENCOUNTER — Encounter: Payer: Self-pay | Admitting: Obstetrics & Gynecology

## 2013-06-20 ENCOUNTER — Ambulatory Visit (INDEPENDENT_AMBULATORY_CARE_PROVIDER_SITE_OTHER): Payer: BC Managed Care – PPO

## 2013-06-20 VITALS — BP 99/57 | HR 69 | Temp 97.8°F | Ht 63.0 in | Wt 127.0 lb

## 2013-06-20 VITALS — BP 102/62 | HR 68 | Resp 16 | Ht 63.0 in | Wt 126.0 lb

## 2013-06-20 DIAGNOSIS — Z1151 Encounter for screening for human papillomavirus (HPV): Secondary | ICD-10-CM

## 2013-06-20 DIAGNOSIS — B009 Herpesviral infection, unspecified: Secondary | ICD-10-CM

## 2013-06-20 DIAGNOSIS — M79609 Pain in unspecified limb: Secondary | ICD-10-CM

## 2013-06-20 DIAGNOSIS — Z1231 Encounter for screening mammogram for malignant neoplasm of breast: Secondary | ICD-10-CM

## 2013-06-20 DIAGNOSIS — Z01419 Encounter for gynecological examination (general) (routine) without abnormal findings: Secondary | ICD-10-CM

## 2013-06-20 DIAGNOSIS — Z0000 Encounter for general adult medical examination without abnormal findings: Secondary | ICD-10-CM

## 2013-06-20 DIAGNOSIS — Z124 Encounter for screening for malignant neoplasm of cervix: Secondary | ICD-10-CM

## 2013-06-20 DIAGNOSIS — M79671 Pain in right foot: Secondary | ICD-10-CM

## 2013-06-20 DIAGNOSIS — R252 Cramp and spasm: Secondary | ICD-10-CM

## 2013-06-20 DIAGNOSIS — R29890 Loss of height: Secondary | ICD-10-CM

## 2013-06-20 MED ORDER — ESTRADIOL 10 MCG VA TABS: ORAL_TABLET | VAGINAL | Status: DC

## 2013-06-20 MED ORDER — VALACYCLOVIR HCL 1 G PO TABS: 1000.0000 mg | ORAL_TABLET | Freq: Every day | ORAL | Status: DC

## 2013-06-20 NOTE — Progress Notes (Signed)
Subjective:    Becky Bowers is a 57 y.o. female who presents for an annual exam. She tells me that she has lost 3 inches of height. She needs a refill of her meds. The patient is sexually active. GYN screening history: last pap: was normal. The patient wears seatbelts: yes. The patient participates in regular exercise: yes. ([employer] 5 days per week) Has the patient ever been transfused or tattooed?: no. The patient reports that there is not domestic violence in her life.   Menstrual History: OB History   Grav Para Term Preterm Abortions TAB SAB Ect Mult Living                  Menarche age: 25 Coitarche: 11   No LMP recorded. Patient is postmenopausal.    The following portions of the patient's history were reviewed and updated as appropriate: allergies, current medications, past family history, past medical history, past social history, past surgical history and problem list.  Review of Systems A comprehensive review of systems was negative. She is a retired Education officer, museum. She had a mammogram today and already had the flu vaccine.   Objective:    BP 102/62  Pulse 68  Resp 16  Ht 5\' 3"  (1.6 m)  Wt 126 lb (57.153 kg)  BMI 22.33 kg/m2  General Appearance:    Alert, cooperative, no distress, appears stated age  Head:    Normocephalic, without obvious abnormality, atraumatic  Eyes:    PERRL, conjunctiva/corneas clear, EOM's intact, fundi    benign, both eyes  Ears:    Normal TM's and external ear canals, both ears  Nose:   Nares normal, septum midline, mucosa normal, no drainage    or sinus tenderness  Throat:   Lips, mucosa, and tongue normal; teeth and gums normal  Neck:   Supple, symmetrical, trachea midline, no adenopathy;    thyroid:  no enlargement/tenderness/nodules; no carotid   bruit or JVD  Back:     Symmetric, no curvature, ROM normal, no CVA tenderness  Lungs:     Clear to auscultation bilaterally, respirations unlabored  Chest Wall:    No  tenderness or deformity   Heart:    Regular rate and rhythm, S1 and S2 normal, no murmur, rub   or gallop  Breast Exam:    No tenderness, masses, or nipple abnormality  Abdomen:     Soft, non-tender, bowel sounds active all four quadrants,    no masses, no organomegaly  Genitalia:    Normal female without lesion, discharge or tenderness, NSSA, NT, normal adnexal exam     Extremities:   Extremities normal, atraumatic, no cyanosis or edema  Pulses:   2+ and symmetric all extremities  Skin:   Skin color, texture, turgor normal, no rashes or lesions  Lymph nodes:   Cervical, supraclavicular, and axillary nodes normal  Neurologic:   CNII-XII intact, normal strength, sensation and reflexes    throughout  .    Assessment:    Healthy female exam.  Height loss   Plan:     Breast self exam technique reviewed and patient encouraged to perform self-exam monthly. Thin prep Pap smear. with cotesting Schedule DEXA

## 2013-06-20 NOTE — Progress Notes (Signed)
Subjective:     Becky Bowers is a 57 y.o. female and is here for a comprehensive physical exam. The patient reports pain in right foot. Please see below.  History   Social History  . Marital Status: Married    Spouse Name: Ramon Dredge.     Number of Children: 1  . Years of Education: N/A   Occupational History  . Retired Economist    Social History Main Topics  . Smoking status: Never Smoker   . Smokeless tobacco: Never Used  . Alcohol Use: 1.2 oz/week    2 Glasses of wine per week  . Drug Use: No  . 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   Other Topics Concern  . Not on file   Social History Narrative   1  Caffeine drink per day. 5 days per week exercising.    Health Maintenance  Topic Date Due  . Influenza Vaccine  03/08/2013  . Colonoscopy  02/02/2014  . Mammogram  06/14/2014  . Pap Smear  06/15/2015  . Tetanus/tdap  01/11/2021    The following portions of the patient's history were reviewed and updated as appropriate: allergies, current medications, past family history, past medical history, past social history, past surgical history and problem list.  Review of Systems A comprehensive review of systems was negative.   Objective:    BP 99/57  Pulse 69  Temp(Src) 97.8 F (36.6 C)  Ht 5\' 3"  (1.6 m)  Wt 127 lb (57.607 kg)  BMI 22.50 kg/m2 General appearance: alert and oriented Head: Normocephalic, without obvious abnormality, atraumatic Eyes: conj clear, EOMI, PEERLA Ears: normal TM's and external ear canals both ears Nose: Nares normal. Septum midline. Mucosa normal. No drainage or sinus tenderness. Throat: lips, mucosa, and tongue normal; teeth and gums normal Neck: no adenopathy, no carotid bruit, no JVD, supple, symmetrical, trachea midline and thyroid not enlarged, symmetric, no tenderness/mass/nodules Back: symmetric, no curvature.  ROM normal. No CVA tenderness. Lungs: clear to auscultation bilaterally Heart: regular rate and rhythm, S1, S2 normal, no murmur, click, rub or gallop Abdomen: soft, non-tender; bowel sounds normal; no masses,  no organomegaly Extremities: extremities normal, atraumatic, no cyanosis or edema Pulses: 2+ and symmetric Skin: Skin color, texture, turgor normal. No rashes or lesions Lymph nodes: Cervical adenopathy: normal and Supraclavicular adenopathy: normal Neurologic: Alert and oriented X 3, normal strength and tone. Normal symmetric reflexes. Normal coordination and gait  Right Foot: no pain over the ball of the foot.  Ankle with NROM.  DP pulse 2+.     Assessment:    Healthy female exam.     Plan:     See After Visit Summary for Counseling Recommendations   Keep up a regular exercise program and make sure you are eating a healthy diet Try to eat 4 servings of dairy a day, or if you are lactose intolerant take a calcium with vitamin D daily.  Your vaccines are up to date.   Pain, right foot- ball of foot.  She has had pain at the ball of the foot for several weeks at this point. No injury or trauma. 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 She says is very sore in the mornings when she first gets out of bed but still hurts all  day. She says she has a sensation of a lump being underneath her toes but says does not actually feel a palpable lump. Activity does make it worse. Better with rest. She's not taking medications for. Discussed getting her metatarsal pads to wear for the next couple weeks to see if this makes a difference. I think hoping to recreate her distal arch would be helpful. If she's not improving then would like her to see my partner Dr. Rodney Langton, or sports medicine physician for further evaluation and treatment.  Leg cramps-she's also been experiencing  leg cramps. She wants and what could be potentially causing this. She feels overall she took a diet and gets enough potassium regularly. We discussed that oftentimes activity level change can trigger this. She we're can also trigger leg cramps. Wearing very flat shoes will sometimes cause cramps. We will certainly check her potassium and magnesium on her blood work. Ms. likely mechanical. Recommend gentle stretches throughout the day to help the muscle tissue.

## 2013-06-20 NOTE — Patient Instructions (Signed)
Keep up a regular exercise program and make sure you are eating a healthy diet Try to eat 4 servings of dairy a day, or if you are lactose intolerant take a calcium with vitamin D daily.  Your vaccines are up to date.   

## 2013-06-21 LAB — COMPLETE METABOLIC PANEL WITH GFR
AST: 23 U/L (ref 0–37)
Albumin: 4.3 g/dL (ref 3.5–5.2)
Alkaline Phosphatase: 85 U/L (ref 39–117)
Chloride: 102 mEq/L (ref 96–112)
Creat: 0.82 mg/dL (ref 0.50–1.10)
GFR, Est African American: >89 mL/min
Glucose, Bld: 90 mg/dL (ref 70–99)
Potassium: 4.5 mEq/L (ref 3.5–5.3)
Sodium: 138 mEq/L (ref 135–145)
Total Protein: 6.6 g/dL (ref 6.0–8.3)

## 2013-06-21 LAB — LIPID PANEL
Cholesterol: 176 mg/dL (ref 0–200)
HDL: 73 mg/dL (ref >39)
Total CHOL/HDL Ratio: 2.4 Ratio
VLDL: 14 mg/dL (ref 0–40)

## 2013-06-21 LAB — MAGNESIUM: Magnesium: 2.2 mg/dL (ref 1.5–2.5)

## 2013-06-24 NOTE — Progress Notes (Signed)
Quick Note:  All labs are normal. ______ 

## 2013-07-02 ENCOUNTER — Ambulatory Visit (INDEPENDENT_AMBULATORY_CARE_PROVIDER_SITE_OTHER): Payer: BC Managed Care – PPO

## 2013-07-02 DIAGNOSIS — M899 Disorder of bone, unspecified: Secondary | ICD-10-CM

## 2013-07-02 DIAGNOSIS — R29890 Loss of height: Secondary | ICD-10-CM

## 2013-07-08 ENCOUNTER — Encounter: Payer: Self-pay | Admitting: *Deleted

## 2013-07-18 ENCOUNTER — Ambulatory Visit (INDEPENDENT_AMBULATORY_CARE_PROVIDER_SITE_OTHER): Payer: BC Managed Care – PPO | Admitting: Obstetrics & Gynecology

## 2013-07-18 ENCOUNTER — Encounter: Payer: Self-pay | Admitting: Obstetrics & Gynecology

## 2013-07-18 VITALS — BP 98/63 | HR 78 | Resp 16 | Ht 63.0 in | Wt 126.0 lb

## 2013-07-18 DIAGNOSIS — M8580 Other specified disorders of bone density and structure, unspecified site: Secondary | ICD-10-CM

## 2013-07-18 DIAGNOSIS — M948X9 Other specified disorders of cartilage, unspecified sites: Secondary | ICD-10-CM

## 2013-07-18 MED ORDER — IBANDRONATE SODIUM 150 MG PO TABS: 150.0000 mg | ORAL_TABLET | ORAL | Status: DC

## 2013-07-18 NOTE — Progress Notes (Signed)
   Subjective:    Patient ID: Becky Bowers, female    DOB: 12-28-1955, 57 y.o.   MRN: [id number]  HPI  She is here to discuss low bone density. She already does weight bearing exercise at 4 times per week. She takes ca with Vit d daily.  Review of Systems     Objective:   Physical Exam        Assessment & Plan:  As above.  We discussed general categories of meds inc biphosponates and combination HRT. She opts for Boniva 150 mg monthly I rec'd increase ca/vit d to BID

## 2013-11-05 ENCOUNTER — Other Ambulatory Visit: Payer: Self-pay | Admitting: Cardiology

## 2014-05-20 ENCOUNTER — Other Ambulatory Visit: Payer: Self-pay | Admitting: Obstetrics & Gynecology

## 2014-05-20 DIAGNOSIS — Z1231 Encounter for screening mammogram for malignant neoplasm of breast: Secondary | ICD-10-CM

## 2014-06-23 ENCOUNTER — Ambulatory Visit: Payer: BC Managed Care – PPO | Admitting: Obstetrics & Gynecology

## 2014-06-25 ENCOUNTER — Ambulatory Visit (INDEPENDENT_AMBULATORY_CARE_PROVIDER_SITE_OTHER): Payer: BC Managed Care – PPO | Admitting: Obstetrics & Gynecology

## 2014-06-25 ENCOUNTER — Other Ambulatory Visit: Payer: Self-pay | Admitting: *Deleted

## 2014-06-25 ENCOUNTER — Encounter: Payer: Self-pay | Admitting: Obstetrics & Gynecology

## 2014-06-25 ENCOUNTER — Ambulatory Visit (INDEPENDENT_AMBULATORY_CARE_PROVIDER_SITE_OTHER): Payer: BC Managed Care – PPO | Admitting: Family Medicine

## 2014-06-25 ENCOUNTER — Ambulatory Visit (INDEPENDENT_AMBULATORY_CARE_PROVIDER_SITE_OTHER): Payer: BC Managed Care – PPO

## 2014-06-25 ENCOUNTER — Encounter: Payer: Self-pay | Admitting: Family Medicine

## 2014-06-25 VITALS — BP 123/72 | HR 51 | Resp 16 | Ht 63.0 in | Wt 126.0 lb

## 2014-06-25 VITALS — BP 106/65 | HR 50 | Temp 98.0°F | Ht 63.0 in | Wt 126.0 lb

## 2014-06-25 DIAGNOSIS — Z1151 Encounter for screening for human papillomavirus (HPV): Secondary | ICD-10-CM

## 2014-06-25 DIAGNOSIS — Z1211 Encounter for screening for malignant neoplasm of colon: Secondary | ICD-10-CM | POA: Diagnosis not present

## 2014-06-25 DIAGNOSIS — Z124 Encounter for screening for malignant neoplasm of cervix: Secondary | ICD-10-CM

## 2014-06-25 DIAGNOSIS — Z0000 Encounter for general adult medical examination without abnormal findings: Secondary | ICD-10-CM

## 2014-06-25 DIAGNOSIS — M25511 Pain in right shoulder: Secondary | ICD-10-CM

## 2014-06-25 DIAGNOSIS — M19011 Primary osteoarthritis, right shoulder: Secondary | ICD-10-CM

## 2014-06-25 DIAGNOSIS — Z1231 Encounter for screening mammogram for malignant neoplasm of breast: Secondary | ICD-10-CM

## 2014-06-25 LAB — LIPID PANEL
Cholesterol: 183 mg/dL (ref 0–200)
HDL: 74 mg/dL (ref >39)
LDL CALC: 91 mg/dL (ref 0–99)
TRIGLYCERIDES: 89 mg/dL (ref <150)
Total CHOL/HDL Ratio: 2.5 Ratio
VLDL: 18 mg/dL (ref 0–40)

## 2014-06-25 LAB — COMPLETE METABOLIC PANEL WITH GFR
ALT: 8 U/L (ref 0–35)
AST: 25 U/L (ref 0–37)
Albumin: 4.6 g/dL (ref 3.5–5.2)
Alkaline Phosphatase: 43 U/L (ref 39–117)
BUN: 16 mg/dL (ref 6–23)
CALCIUM: 9.4 mg/dL (ref 8.4–10.5)
CHLORIDE: 103 meq/L (ref 96–112)
CO2: 27 mEq/L (ref 19–32)
CREATININE: 0.84 mg/dL (ref 0.50–1.10)
GFR, Est African American: 89 mL/min
GFR, Est Non African American: 77 mL/min
Glucose, Bld: 95 mg/dL (ref 70–99)
Potassium: 4.4 mEq/L (ref 3.5–5.3)
Sodium: 141 mEq/L (ref 135–145)
Total Bilirubin: 0.6 mg/dL (ref 0.2–1.2)
Total Protein: 6.9 g/dL (ref 6.0–8.3)

## 2014-06-25 MED ORDER — ESTRADIOL 10 MCG VA TABS: ORAL_TABLET | VAGINAL | Status: DC

## 2014-06-25 MED ORDER — IBANDRONATE SODIUM 150 MG PO TABS: 150.0000 mg | ORAL_TABLET | ORAL | Status: DC

## 2014-06-25 NOTE — Progress Notes (Signed)
Subjective:     Becky Bowers is a 58 y.o. female and is here for a comprehensive physical exam. The patient reports no problems.she has her mammograms scheduled for later today and has an appointment scheduled with Dr. Marice Potterove, her OB/GYN for her pelvic and breast exam.  Right shoulder has been bothering for about 2 months.  No recent trauma.  Did dislocated the shoulder 20 years ago.  Had backed off at the gym. Can sleep on that shoulder. Pain on the outside off the shoulder and radiates down toward the elbow.   History   Social History  . Marital Status: Married    Spouse Name: Ramon Dredgedward.     Number of Children: 1  . Years of Education: N/A   Occupational History  . Retired EconomistBand Teacher    Social History Main Topics  . Smoking status: Never Smoker   . Smokeless tobacco: Never Used  . Alcohol Use: 1.2 - 2.4 oz/week    2-4 Glasses of wine per week  . Drug Use: No  . 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   Other Topics Concern  . Not on file   Social History Narrative   1  Caffeine drink per day. 5 days per week exercising.    Health Maintenance  Topic Date Due  . COLONOSCOPY  02/02/2014  . INFLUENZA VACCINE  03/08/2014  . MAMMOGRAM  06/21/2015  . PAP SMEAR  06/20/2016  . TETANUS/TDAP  01/11/2021    The following portions of the patient's history were reviewed and updated as appropriate: allergies, current medications, past family history, past medical history, past social history, past surgical history and problem list.  Review of Systems A comprehensive review of systems was negative.   Objective:    BP 106/65 mmHg  Pulse 50  Temp(Src) 98 F (36.7 C)  Ht 5\' 3"  (1.6 m)  Wt 126 lb (57.153 kg)  BMI 22.33 kg/m2  SpO2 99% General appearance: alert, cooperative and appears older than stated age Head: Normocephalic, without obvious abnormality,  atraumatic Eyes: conj clear, EOMI, PEERLA Ears: normal TM's and external ear canals both ears Nose: Nares normal. Septum midline. Mucosa normal. No drainage or sinus tenderness. Throat: lips, mucosa, and tongue normal; teeth and gums normal Neck: no adenopathy, no carotid bruit, no JVD, supple, symmetrical, trachea midline and thyroid not enlarged, symmetric, no tenderness/mass/nodules Back: symmetric, no curvature. ROM normal. No CVA tenderness. Lungs: clear to auscultation bilaterally Heart: regular rate and rhythm, S1, S2 normal, no murmur, click, rub or gallop Abdomen: soft, non-tender; bowel sounds normal; no masses,  no organomegaly Extremities: extremities normal, atraumatic, no cyanosis or edema Pulses: 2+ and symmetric Skin: Skin color, texture, turgor normal. No rashes or lesions Lymph nodes: Cervical adenopathy: nl and Axillary adenopathy: nl Neurologic: Alert and oriented X 3, normal strength and tone. Normal symmetric reflexes. Normal coordination and gait     Right shoulder with normal range of motion. Nontender on exam. Negative entity can test. No significant discomfort with internal or external rotation. Strength is 5 out of 5 at the shoulder, elbow and wrist. Assessment:    Healthy female exam.     Plan:     See After Visit Summary for Counseling Recommendations  complete physical examination Keep up a regular exercise program and make sure you are eating a healthy diet Try to eat 4 servings of dairy a day, or  if you are lactose intolerant take a calcium with vitamin D daily.  Your vaccines are up to date.   Right shoulder pain-will get x-ray today. Based on exam today less likely to be bursitis or rotator cuff injury. Suspect arthritis with her prior dislocation 20 years ago. Given a handout with stretches and recommend conservative therapy. We will get an x-ray to evaluate for arthritis per her preference.We will call with results once available.

## 2014-06-25 NOTE — Progress Notes (Signed)
Subjective:    Becky Bowers is a 58 y.o. MW P1 34(57 yo daughter) female who presents for an annual exam. The patient has no complaints today. The patient is sexually active. GYN screening history: last pap: was normal. The patient wears seatbelts: yes. The patient participates in regular exercise: yes. Has the patient ever been transfused or tattooed?: no. The patient reports that there is not domestic violence in her life.   Menstrual History: OB History    No data available      Menarche age: [age]  No LMP recorded. Patient is postmenopausal.    The following portions of the patient's history were reviewed and updated as appropriate: allergies, current medications, past family history, past medical history, past social history, past surgical history and problem list.  Review of Systems A comprehensive review of systems was negative. Retired Runner, broadcasting/film/videoteacher. Married for 27 years, denies dyspareunia. She will get colonoscopy this year. Had mammogram today, had flu vaccine.   Objective:    BP 123/72 mmHg  Pulse 51  Resp 16  Ht 5\' 3"  (1.6 m)  Wt 126 lb (57.153 kg)  BMI 22.33 kg/m2  General Appearance:    Alert, cooperative, no distress, appears stated age  Head:    Normocephalic, without obvious abnormality, atraumatic  Eyes:    PERRL, conjunctiva/corneas clear, EOM's intact, fundi    benign, both eyes  Ears:    Normal TM's and external ear canals, both ears  Nose:   Nares normal, septum midline, mucosa normal, no drainage    or sinus tenderness  Throat:   Lips, mucosa, and tongue normal; teeth and gums normal  Neck:   Supple, symmetrical, trachea midline, no adenopathy;    thyroid:  no enlargement/tenderness/nodules; no carotid   bruit or JVD  Back:     Symmetric, no curvature, ROM normal, no CVA tenderness  Lungs:     Clear to auscultation bilaterally, respirations unlabored  Chest Wall:    No tenderness or deformity   Heart:    Regular rate and rhythm, S1 and S2 normal, no murmur,  rub   or gallop  Breast Exam:    No tenderness, masses, or nipple abnormality  Abdomen:     Soft, non-tender, bowel sounds active all four quadrants,    no masses, no organomegaly  Genitalia:    Normal female without lesion, discharge or tenderness     Extremities:   Extremities normal, atraumatic, no cyanosis or edema  Pulses:   2+ and symmetric all extremities  Skin:   Skin color, texture, turgor normal, no rashes or lesions  Lymph nodes:   Cervical, supraclavicular, and axillary nodes normal  Neurologic:   CNII-XII intact, normal strength, sensation and reflexes    throughout  .    Assessment:    Healthy female exam.    Plan:     Breast self exam technique reviewed and patient encouraged to perform self-exam monthly. Mammogram. Thin prep Pap smear. with cotesting

## 2014-06-25 NOTE — Progress Notes (Signed)
Quick Note:  All labs are normal. ______ 

## 2014-06-25 NOTE — Patient Instructions (Signed)
complete physical examination Keep up a regular exercise program and make sure you are eating a healthy diet Try to eat 4 servings of dairy a day, or if you are lactose intolerant take a calcium with vitamin D daily.  Your vaccines are up to date.   

## 2014-06-26 LAB — CYTOLOGY - PAP

## 2014-06-26 LAB — TSH: TSH: 0.684 u[IU]/mL (ref 0.350–4.500)

## 2014-08-13 IMAGING — DX DG DXA BONE DENSITY STUDY HL7
3 series · 3 of 3 positions shown · non-contrast
Comparison: [DATE]% decrease in bone density over the lumbar spine
compared to 06/01/2010 which is statistically significant.

CLINICAL DATA: Postmenopausal osteoporosis screening.

EXAM:
DUAL X-RAY ABSORPTIOMETRY (DXA) FOR BONE MINERAL DENSITY

[view not recorded (1 of 3)]
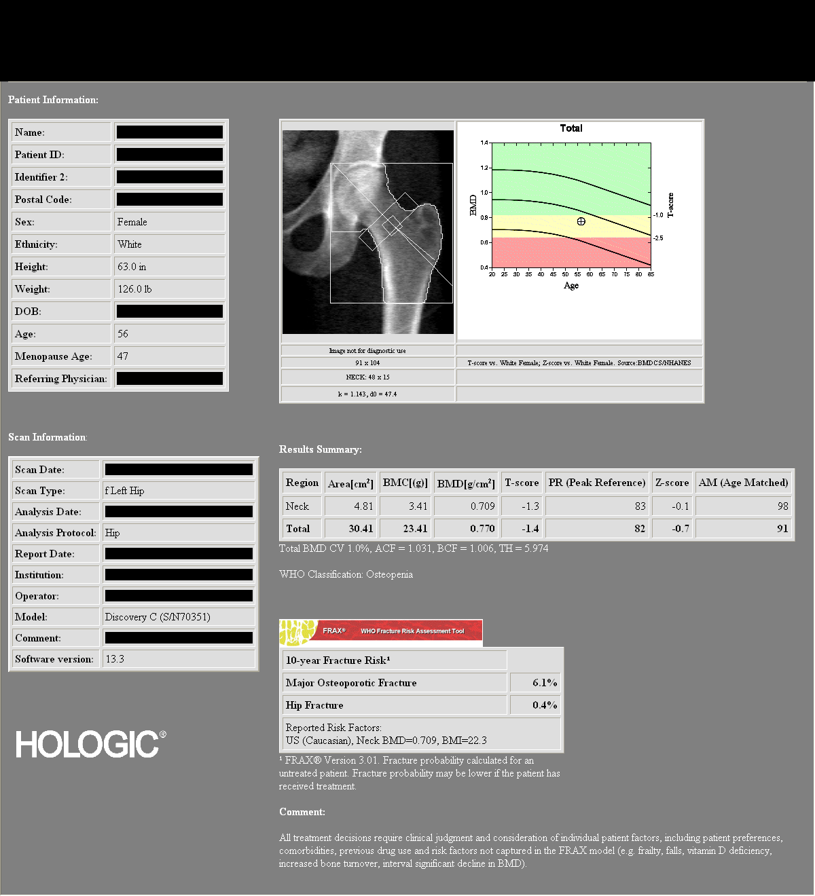

[view not recorded (2 of 3)]
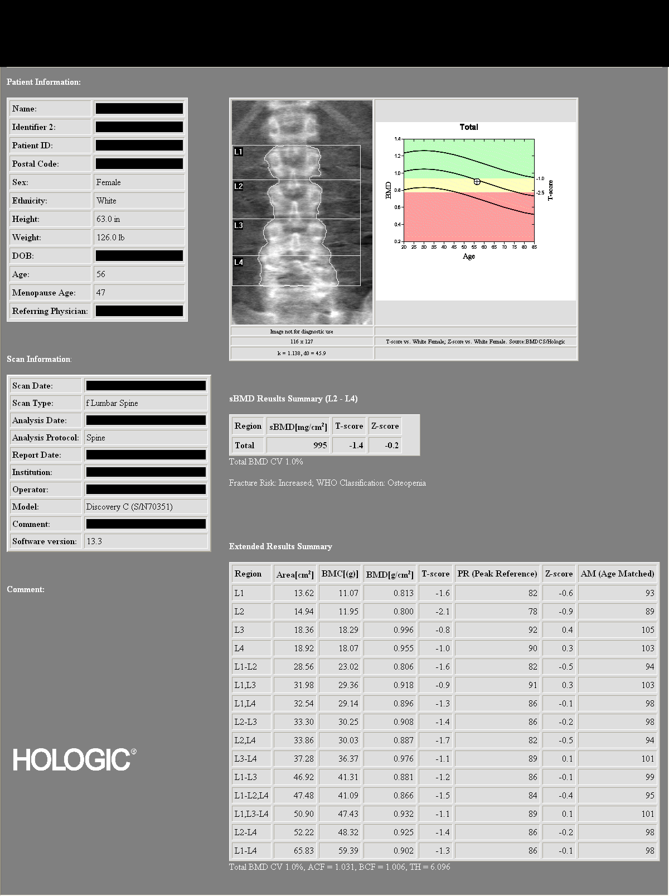

[view not recorded (3 of 3)]
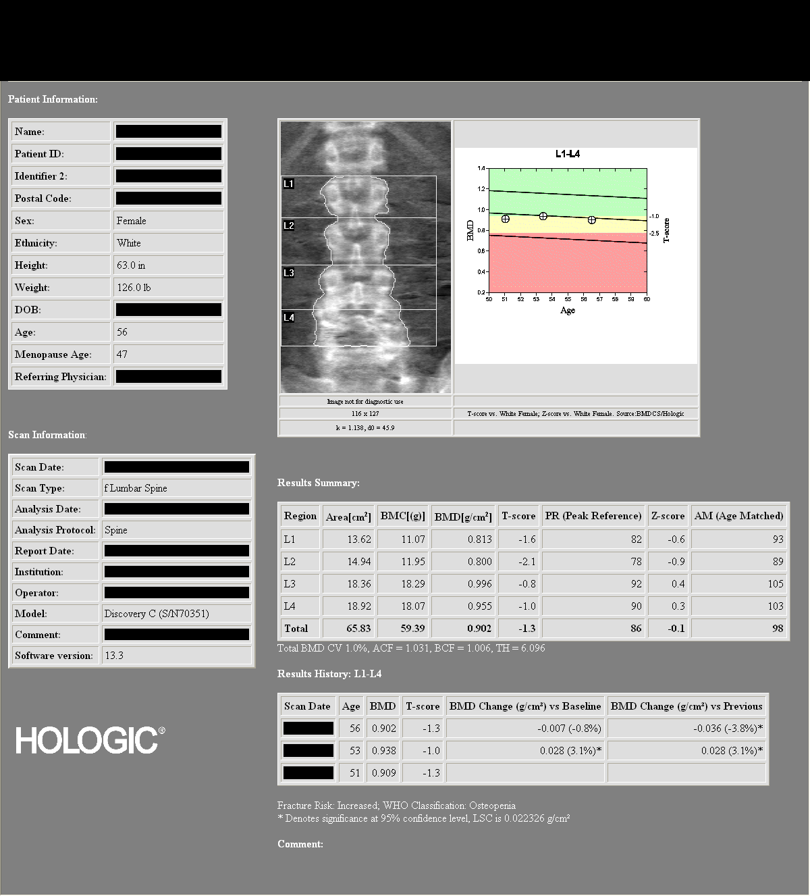

[3 of 3 positions shown; findings below may reference images not displayed]

12.7%
decrease in bone density over the total left hip compared to 0766
which is statistically significant.
FINDINGS: AP LUMBAR SPINE:  L1 and L2

Bone Mineral Density (BMD):  0.806 g/cm2

Young Adult T Score:  -1.6

Z Score:  -0.5

Left FEMUR total

Bone Mineral Density (BMD):  0.770 g/cm2

Young Adult T Score: -1.4

Z Score:  -0.7

ASSESSMENT: Patient's diagnostic category is LOW BONE MASS by WHO
Criteria.

FRACTURE RISK:  INCREASED

FRAX:

Based on the World Health Organization FRAX model, the 10 year
probability of a major osteoporotic fracture is 6.1%. The 10 year
probability of a hip fracture is 0.4%.

RECOMMENDATIONS:
Effective therapies are available in the form of bisphosphonates,
selective estrogen receptor modulators, biologic agents, and hormone
replacement therapy (for women). All patients should ensure an
adequate intake of dietary calcium (1200mg daily) and vitamin D (800
Rodrick Jumper) unless contraindicated.

All treatment decisions require clinical judgement and consideration
of individual patient factors, including patient preferences,
co-morbidities, previous drug use, risk factors not captured in the
FRAX model (e.g., frailty, falls, vitamin D deficiency, increased
bone turnover, interval significant decline in bone density) and
possible under-or over-estimation of fracture risk by FRAX.

The National Osteoporosis Foundation recommends that FDA-approved
medical

therapies be considered in postmenopausal women and mean age 50 or
older with a:

Effective therapies are available in the form of bisphosphonates,
selective estrogen receptor modulators, biologic agents, and hormone
replacement therapy (for women). All patients should ensure an
adequate intake of dietary calcium (1200mg daily) and vitamin D (800
Rodrick Jumper) unless contraindicated.

All treatment decisions require clinical judgement and consideration
of individual patient factors, including patient preferences,
co-morbidities, previous drug use, risk factors not captured in the
FRAX model (e.g., frailty, falls, vitamin D deficiency, increased
bone turnover, interval significant decline in bone density) and
possible under-or over-estimation of fracture risk by FRAX.

The National Osteoporosis Foundation recommends that FDA-approved
medical

therapies be considered in postmenopausal women and mean age 50 or
older with a:

1. Hip or vertebral (clinical or morphometric) fracture.

2. T-score of -2.5 or lower at the spine or hip.

3. Ten-year fracture probability by FRAX of 3% or greater for hip
fracture or 20% or greater for major osteoporotic fracture.

FOLLOW UP:  People with diagnosed cases of osteoporosis or at high
risk for fracture should have regular bone mineral density tests.
For patients eligible for Medicare, routine testing is allowed once
every 2 years. The testing frequency can be increased to one year
for patients who have rapidly progressing disease, those who are
receiving or discontinuing medical therapy to restore bone mass, or
have additional risk factors.

World Health Organization (WHO) Criteria:

Normal: T scores from +1.0 to -1.0

Low Bone Mass (Osteopenia): T scores between -1.0 and -2.5

Osteoporosis: T scores -2.5 and below

Comparison to Reference Population:

T score is the key measure used in the diagnosis of osteoporosis and
relative risk determination for fracture. It provides a value for
bone mass relative to the mean bone mass of a young adult reference
population expressed in terms of standard deviation (SD).

Z score is the age-matched score showing the patient's values
compared to a population matched for age, sex, and race. This is
also expressed in terms of standard deviation. The patient may have
values that compare favorably to the age-matched values and still be
at increased risk for fracture.

## 2014-08-15 LAB — HM COLONOSCOPY

## 2014-08-25 ENCOUNTER — Encounter: Payer: Self-pay | Admitting: Family Medicine

## 2014-09-16 ENCOUNTER — Other Ambulatory Visit: Payer: Self-pay | Admitting: *Deleted

## 2014-09-16 DIAGNOSIS — B009 Herpesviral infection, unspecified: Secondary | ICD-10-CM

## 2014-09-16 MED ORDER — VALACYCLOVIR HCL 1 G PO TABS: 1000.0000 mg | ORAL_TABLET | ORAL | Status: DC | PRN

## 2014-09-16 NOTE — Telephone Encounter (Signed)
Pharmacy sent over authorization for Valtrex per VO Dr Marice Potterove.

## 2014-11-06 ENCOUNTER — Encounter: Payer: Self-pay | Admitting: Family Medicine

## 2014-11-18 ENCOUNTER — Encounter: Payer: Self-pay | Admitting: Family Medicine

## 2015-02-02 ENCOUNTER — Encounter: Payer: Self-pay | Admitting: Emergency Medicine

## 2015-02-02 ENCOUNTER — Emergency Department
Admission: EM | Admit: 2015-02-02 | Discharge: 2015-02-02 | Disposition: A | Discharge status: Home / Self Care | Payer: BC Managed Care – PPO | Source: Home / Self Care | Attending: Emergency Medicine | Admitting: Emergency Medicine

## 2015-02-02 DIAGNOSIS — J0120 Acute ethmoidal sinusitis, unspecified: Secondary | ICD-10-CM

## 2015-02-02 MED ORDER — AMOXICILLIN 500 MG PO CAPS: 500.0000 mg | ORAL_CAPSULE | Freq: Three times a day (TID) | ORAL | Status: DC

## 2015-02-02 NOTE — ED Provider Notes (Signed)
CSN: [id number]     Arrival date & time 02/02/15  6440 History   First MD Initiated Contact with Patient 02/02/15 [phone]     Chief Complaint  Patient presents with  . URI   (Consider location/radiation/quality/duration/timing/severity/associated sxs/prior Treatment) Patient is a 59 y.o. female presenting with cough. The history is provided by the patient. No language interpreter was used.  Cough Cough characteristics:  Productive Sputum characteristics:  Nondescript Severity:  Moderate Onset quality:  Gradual Duration:  1 week Timing:  Constant Progression:  Worsening Chronicity:  New Smoker: no   Context: upper respiratory infection   Relieved by:  Nothing Ineffective treatments:  None tried Associated symptoms: ear fullness, ear pain, rhinorrhea, sinus congestion and sore throat   Associated symptoms: no fever     History reviewed. No pertinent past medical history. Past Surgical History  Procedure Laterality Date  . Appendectomy  1968  . Tonsillectomy  1980  . Dilation and curettage of uterus  1990   Family History  Problem Relation Age of Onset  . Diabetes Father   . Hypertension Father   . Colon cancer Paternal Grandmother   . Alzheimer's disease Paternal Grandmother   . Squamous cell carcinoma Father     skin    History  Substance Use Topics  . Smoking status: Never Smoker   . Smokeless tobacco: Never Used  . Alcohol Use: 1.2 - 2.4 oz/week    2-4 Glasses of wine per week   OB History    No data available     Review of Systems  Constitutional: Negative for fever.  HENT: Positive for ear pain, rhinorrhea and sore throat.   Respiratory: Positive for cough.   All other systems reviewed and are negative.   Allergies  Review of patient's allergies indicates not on file.  Home Medications   Prior to Admission medications   Medication Sig Start Date End Date Taking? Authorizing Provider  ammonium lactate (LAC-HYDRIN) 12 % lotion Apply 1 application  topically as needed for dry skin.    Historical Provider, MD  Azelaic Acid (FINACEA) 15 % cream Apply topically at bedtime as needed. After skin is thoroughly washed and patted dry, gently but thoroughly massage a thin film of azelaic acid cream into the affected area twice daily, in the morning and evening.    Historical Provider, MD  Calcium Carbonate-Vitamin D (CALCIUM + D) 600-200 MG-UNIT TABS Take 1 tablet by mouth 2 (two) times daily.     Historical Provider, MD  Estradiol 10 MCG TABS vaginal tablet 1 tablet qhs for a week and then 2 times per week 06/25/14   Allie Bossier, MD  ibandronate (BONIVA) 150 MG tablet Take 1 tablet (150 mg total) by mouth every 30 (thirty) days. Take in the morning with a full glass of water, on an empty stomach, and do not take anything else by mouth or lie down for the next 30 min. 06/25/14   Allie Bossier, MD  minocycline (MINOCIN,DYNACIN) 50 MG capsule Take 1 capsule by mouth daily.  04/09/14   Historical Provider, MD  Multiple Vitamins-Minerals (ONE-A-DAY WOMENS 50 PLUS PO) Take 1 capsule by mouth.    Historical Provider, MD  Omega-3 Fatty Acids (FISH OIL) 1000 MG CAPS Take 1 capsule by mouth daily.      Historical Provider, MD  Pediatric Multivitamins-Iron (FLINTSTONES PLUS IRON PO) Take by mouth.    Historical Provider, MD  valACYclovir (VALTREX) 1000 MG tablet Take 1 tablet (1,000 mg total) by mouth  as needed. 09/16/14   Allie BossierMyra C Dove, MD   BP 118/75 mmHg  Pulse 63  Temp(Src) 98.3 F (36.8 C) (Oral)  Ht 5\' 3"  (1.6 m)  Wt 119 lb (53.978 kg)  BMI 21.09 kg/m2  SpO2 99% Physical Exam  Constitutional: She is oriented to person, place, and time. She appears well-developed and well-nourished.  HENT:  Head: Normocephalic.  Right Ear: External ear normal.  Left Ear: External ear normal.  Nose: Nose normal.  Mouth/Throat: Oropharynx is clear and moist.  Tender maxillary sinus area  Eyes: Conjunctivae and EOM are normal. Pupils are equal, round, and reactive to light.   Neck: Normal range of motion.  Cardiovascular: Normal rate and regular rhythm.   Pulmonary/Chest: Effort normal.  Abdominal: Soft. She exhibits no distension.  Musculoskeletal: Normal range of motion.  Neurological: She is alert and oriented to person, place, and time.  Psychiatric: She has a normal mood and affect.  Nursing note and vitals reviewed.   ED Course  Procedures (including critical care time) Labs Review Labs Reviewed - No data to display  Imaging Review No results found.   MDM   1. Acute ethmoidal sinusitis, recurrence not specified    amoxicilian See your physicain for recheck if symptoms persist AVS    Becky AreasLeslie K Sofia, PA-C 02/02/15 1301

## 2015-02-02 NOTE — Discharge Instructions (Signed)

## 2015-02-02 NOTE — ED Notes (Signed)
1 Week, dry cough, sore throat, ear pain, chills, congestion

## 2015-02-12 ENCOUNTER — Encounter: Payer: Self-pay | Admitting: Sports Medicine

## 2015-02-12 ENCOUNTER — Telehealth: Payer: Self-pay | Admitting: Family Medicine

## 2015-02-12 ENCOUNTER — Ambulatory Visit (INDEPENDENT_AMBULATORY_CARE_PROVIDER_SITE_OTHER): Payer: BC Managed Care – PPO | Admitting: Sports Medicine

## 2015-02-12 VITALS — BP 99/63 | HR 56 | Temp 97.8°F | Wt 123.0 lb

## 2015-02-12 DIAGNOSIS — H6993 Unspecified Eustachian tube disorder, bilateral: Secondary | ICD-10-CM

## 2015-02-12 DIAGNOSIS — H6983 Other specified disorders of Eustachian tube, bilateral: Secondary | ICD-10-CM | POA: Diagnosis not present

## 2015-02-12 DIAGNOSIS — H6990 Unspecified Eustachian tube disorder, unspecified ear: Secondary | ICD-10-CM | POA: Insufficient documentation

## 2015-02-12 DIAGNOSIS — H6980 Other specified disorders of Eustachian tube, unspecified ear: Secondary | ICD-10-CM | POA: Insufficient documentation

## 2015-02-12 MED ORDER — AZELASTINE-FLUTICASONE 137-50 MCG/ACT NA SUSP: NASAL | Status: DC

## 2015-02-12 MED ORDER — PHENYLEPHRINE HCL 10 MG PO TABS: 10.0000 mg | ORAL_TABLET | Freq: Three times a day (TID) | ORAL | Status: DC | PRN

## 2015-02-12 NOTE — Progress Notes (Signed)
  Subjective:    CC: Ears stopped up  HPI: This is a pleasant 59 year old female, last week she was treated for what was thought to be a sinus infection with amoxicillin, upper afterward symptoms improved however she continued to have stuffiness in her ears, bilateral, left worse than right. She tried some decongestants with did not help but she's never done a nasal steroids. She does have some tinnitus in the left ear, hearing is slightly distorted. No vertigo. Symptoms are moderate, persistent.  Past medical history, Surgical history, Family history not pertinant except as noted below, Social history, Allergies, and medications have been entered into the medical record, reviewed, and no changes needed.   Review of Systems: No fevers, chills, night sweats, weight loss, chest pain, or shortness of breath.   Objective:    General: Well Developed, well nourished, and in no acute distress.  Neuro: Alert and oriented x3, extra-ocular muscles intact, sensation grossly intact.  HEENT: Normocephalic, atraumatic, pupils equal round reactive to light, neck supple, no masses, no lymphadenopathy, thyroid nonpalpable. Oropharynx, nasopharynx, ear canals unremarkable. Skin: Warm and dry, no rashes. Cardiac: Regular rate and rhythm, no murmurs rubs or gallops, no lower extremity edema.  Respiratory: Clear to auscultation bilaterally. Not using accessory muscles, speaking in full sentences.  Impression and Recommendations:

## 2015-02-12 NOTE — Assessment & Plan Note (Signed)
Bilateral, right worse than left eustachian tube dysfunction, oral decongestants and intranasal steroids. Return if no better one to 2 weeks.

## 2015-02-12 NOTE — Patient Instructions (Signed)
Barotitis Media Barotitis media is inflammation of your middle ear. This occurs when the auditory tube (eustachian tube) leading from the back of your nose (nasopharynx) to your eardrum is blocked. This blockage may result from a cold, environmental allergies, or an upper respiratory infection. Unresolved barotitis media may lead to damage or hearing loss (barotrauma), which may become permanent. HOME CARE INSTRUCTIONS   Use medicines as recommended by your health care provider. Over-the-counter medicines will help unblock the canal and can help during times of air travel.  Do not put anything into your ears to clean or unplug them. Eardrops will not be helpful.  Do not swim, dive, or fly until your health care provider says it is all right to do so. If these activities are necessary, chewing gum with frequent, forceful swallowing may help. It is also helpful to hold your nose and gently blow to pop your ears for equalizing pressure changes. This forces air into the eustachian tube.  Only take over-the-counter or prescription medicines for pain, discomfort, or fever as directed by your health care provider.  A decongestant may be helpful in decongesting the middle ear and make pressure equalization easier. SEEK MEDICAL CARE IF:  You experience a serious form of dizziness in which you feel as if the room is spinning and you feel nauseated (vertigo).  Your symptoms only involve one ear. SEEK IMMEDIATE MEDICAL CARE IF:   You develop a severe headache, dizziness, or severe ear pain.  You have bloody or pus-like drainage from your ears.  You develop a fever.  Your problems do not improve or become worse. MAKE SURE YOU:   Understand these instructions.  Will watch your condition.  Will get help right away if you are not doing well or get worse. Document Released: 07/22/2000 Document Revised: 05/15/2013 Document Reviewed: 02/19/2013 ExitCare Patient Information 2015 [employer]. This  information is not intended to replace advice given to you by your health care provider. Make sure you discuss any questions you have with your health care provider.  

## 2015-02-12 NOTE — Telephone Encounter (Signed)
Received fax from pharmacy for prior authorization on Dymista sent through cover my meds waiting on authorization. - CF

## 2015-02-17 NOTE — Telephone Encounter (Signed)
Called [facility] about Dymista they have denied coverage due to patient has not tried Nasonex, Generic Fluticasone, Generic Flunisolide, or Generic Triamcinolone Acetonide. Case ID [id number]. - CF

## 2015-02-19 ENCOUNTER — Other Ambulatory Visit: Payer: Self-pay | Admitting: Sports Medicine

## 2015-02-19 MED ORDER — FLUTICASONE PROPIONATE 50 MCG/ACT NA SUSP: 2.0000 | Freq: Two times a day (BID) | NASAL | Status: DC

## 2015-02-19 NOTE — Telephone Encounter (Signed)
Denied Dymista coverage due to patient has not tried Nasonex, Generic Fluticasone, Generic Flunisolide, or Generic Triamcinolone Acetonide. Case ID [id number]. - CF  Pharmacy request Rx be filled separately as one Rx for Azelastine and one Rx for Fluticasone. Will route to ordering provider.

## 2015-03-03 ENCOUNTER — Ambulatory Visit (INDEPENDENT_AMBULATORY_CARE_PROVIDER_SITE_OTHER): Payer: BC Managed Care – PPO | Admitting: Sports Medicine

## 2015-03-03 VITALS — BP 110/65 | HR 47 | Ht 63.0 in | Wt 122.0 lb

## 2015-03-03 DIAGNOSIS — H6992 Unspecified Eustachian tube disorder, left ear: Secondary | ICD-10-CM

## 2015-03-03 DIAGNOSIS — H6982 Other specified disorders of Eustachian tube, left ear: Secondary | ICD-10-CM

## 2015-03-03 NOTE — Progress Notes (Signed)
  Subjective:    CC: Follow-up  HPI: Bilateral eustachian tube dysfunction: I have been treating Becky Bowers with intranasal steroids, as well as decongestants. She reports that her symptoms are probably 80% improved. Unfortunately she continues to have a bit of left-sided ear symptoms persistently.  Past medical history, Surgical history, Family history not pertinant except as noted below, Social history, Allergies, and medications have been entered into the medical record, reviewed, and no changes needed.   Review of Systems: No fevers, chills, night sweats, weight loss, chest pain, or shortness of breath.   Objective:    General: Well Developed, well nourished, and in no acute distress.  Neuro: Alert and oriented x3, extra-ocular muscles intact, sensation grossly intact.  HEENT: Normocephalic, atraumatic, pupils equal round reactive to light, neck supple, no masses, no lymphadenopathy, thyroid nonpalpable.  Oropharynx, nasopharynx unremarkable, right ear canals unremarkable, left side does show a bit of wax and possibly scab over the tympanic membrane. Skin: Warm and dry, no rashes. Cardiac: Regular rate and rhythm, no murmurs rubs or gallops, no lower extremity edema.  Respiratory: Clear to auscultation bilaterally. Not using accessory muscles, speaking in full sentences.  Impression and Recommendations:

## 2015-03-03 NOTE — Assessment & Plan Note (Addendum)
Significant improvement with Dymista, still has some symptoms on the left side. There is a bit of wax and scab over the left tympanic membrane, this was irrigated today.

## 2015-03-31 ENCOUNTER — Ambulatory Visit (INDEPENDENT_AMBULATORY_CARE_PROVIDER_SITE_OTHER): Payer: BC Managed Care – PPO | Admitting: Family Medicine

## 2015-03-31 ENCOUNTER — Encounter: Payer: Self-pay | Admitting: Family Medicine

## 2015-03-31 VITALS — BP 93/61 | HR 62 | Wt 127.0 lb

## 2015-03-31 DIAGNOSIS — H6982 Other specified disorders of Eustachian tube, left ear: Secondary | ICD-10-CM

## 2015-03-31 DIAGNOSIS — H9202 Otalgia, left ear: Secondary | ICD-10-CM | POA: Diagnosis not present

## 2015-03-31 MED ORDER — PREDNISONE 20 MG PO TABS: 40.0000 mg | ORAL_TABLET | Freq: Every day | ORAL | Status: DC

## 2015-03-31 NOTE — Patient Instructions (Signed)
Call if not better in one week.  

## 2015-03-31 NOTE — Progress Notes (Signed)
   Subjective:    Patient ID: Becky Bowers, female    DOB: 31-Jan-1956, 59 y.o.   MRN: [id number]  HPI Was seen about a month ago for eustachian tube dysfunciton.  Still has some sinus drainage.   Sudafed didn't help.  Saw Dr. Karie Schwalbe and tried to put her on dymista.  She took it for a month. Says helped initially but not sure if toward the end if was helpful.  Left ear still doesn't feel right.  No fever, chills or sweats. No ST or other sinus congetion or cough. She wanted to come back in today to get it checked out. She's very concerned because she's going to be flying to [place] and a couple weeks and says typically she has a lot of palms with her ears when she flies. Review of Systems     Objective:   Physical Exam  Constitutional: She is oriented to person, place, and time. She appears well-developed and well-nourished.  HENT:  Head: Normocephalic and atraumatic.  Right Ear: External ear normal.  Left Ear: External ear normal.  Nose: Nose normal.  Mouth/Throat: Oropharynx is clear and moist.  TMs and canals are clear.   Eyes: Conjunctivae and EOM are normal. Pupils are equal, round, and reactive to light.  Neck: Neck supple. No thyromegaly present.  Lymphadenopathy:    She has no cervical adenopathy.  Neurological: She is alert and oriented to person, place, and time.  Skin: Skin is warm and dry.  Psychiatric: She has a normal mood and affect.          Assessment & Plan:  Left otalgia - Hearing was nromal.  Tympanometry shows normal peak in the left ear. Normal tympanogram. Gave reassurance. Most consistent with eustachian tube dysfunction. She started feeling a nasal steroid and nasal and a histamine. At this point we'll go ahead and try a five-day course of prednisone. If no significant improvement in one week then we could consider ipratropium nasal spray to help dry up some of the persistent nasal/sinus drainage which really is her only other symptom versus referral to ear nose  and throat specialist. She will call and let us know.

## 2015-05-19 ENCOUNTER — Other Ambulatory Visit (HOSPITAL_COMMUNITY): Payer: Self-pay | Admitting: Obstetrics & Gynecology

## 2015-05-19 ENCOUNTER — Telehealth: Payer: Self-pay | Admitting: *Deleted

## 2015-05-19 DIAGNOSIS — Z780 Asymptomatic menopausal state: Secondary | ICD-10-CM

## 2015-05-19 DIAGNOSIS — Z1231 Encounter for screening mammogram for malignant neoplasm of breast: Secondary | ICD-10-CM

## 2015-05-19 NOTE — Telephone Encounter (Signed)
BMD scheduled per Dr Marice Potter.

## 2015-05-29 ENCOUNTER — Telehealth: Payer: Self-pay | Admitting: *Deleted

## 2015-05-29 DIAGNOSIS — B009 Herpesviral infection, unspecified: Secondary | ICD-10-CM

## 2015-05-29 MED ORDER — VALACYCLOVIR HCL 1 G PO TABS
1000.0000 mg | ORAL_TABLET | ORAL | Status: DC | PRN
Start: 2015-05-29 — End: 2016-09-29

## 2015-05-29 NOTE — Telephone Encounter (Signed)
Refilled medication

## 2015-07-06 ENCOUNTER — Ambulatory Visit (HOSPITAL_BASED_OUTPATIENT_CLINIC_OR_DEPARTMENT_OTHER)
Admission: RE | Admit: 2015-07-06 | Discharge: 2015-07-06 | Disposition: A | Discharge status: Home / Self Care | Payer: BC Managed Care – PPO | Source: Ambulatory Visit | Attending: Obstetrics & Gynecology | Admitting: Obstetrics & Gynecology

## 2015-07-06 ENCOUNTER — Encounter: Payer: Self-pay | Admitting: Family Medicine

## 2015-07-06 ENCOUNTER — Encounter: Payer: Self-pay | Admitting: Obstetrics & Gynecology

## 2015-07-06 ENCOUNTER — Ambulatory Visit (INDEPENDENT_AMBULATORY_CARE_PROVIDER_SITE_OTHER): Payer: BC Managed Care – PPO | Admitting: Obstetrics & Gynecology

## 2015-07-06 ENCOUNTER — Ambulatory Visit (INDEPENDENT_AMBULATORY_CARE_PROVIDER_SITE_OTHER): Payer: BC Managed Care – PPO | Admitting: Family Medicine

## 2015-07-06 VITALS — BP 112/67 | HR 51 | Temp 98.2°F | Resp 16 | Wt 128.4 lb

## 2015-07-06 VITALS — BP 107/63 | HR 53 | Resp 16 | Ht 63.0 in | Wt 129.0 lb

## 2015-07-06 DIAGNOSIS — Z124 Encounter for screening for malignant neoplasm of cervix: Secondary | ICD-10-CM

## 2015-07-06 DIAGNOSIS — Z0189 Encounter for other specified special examinations: Secondary | ICD-10-CM

## 2015-07-06 DIAGNOSIS — M8580 Other specified disorders of bone density and structure, unspecified site: Secondary | ICD-10-CM | POA: Diagnosis not present

## 2015-07-06 DIAGNOSIS — Z1159 Encounter for screening for other viral diseases: Secondary | ICD-10-CM | POA: Diagnosis not present

## 2015-07-06 DIAGNOSIS — Z114 Encounter for screening for human immunodeficiency virus [HIV]: Secondary | ICD-10-CM | POA: Diagnosis not present

## 2015-07-06 DIAGNOSIS — Z0000 Encounter for general adult medical examination without abnormal findings: Secondary | ICD-10-CM

## 2015-07-06 DIAGNOSIS — Z01419 Encounter for gynecological examination (general) (routine) without abnormal findings: Secondary | ICD-10-CM

## 2015-07-06 DIAGNOSIS — Z780 Asymptomatic menopausal state: Secondary | ICD-10-CM | POA: Diagnosis present

## 2015-07-06 DIAGNOSIS — Z1151 Encounter for screening for human papillomavirus (HPV): Secondary | ICD-10-CM | POA: Diagnosis not present

## 2015-07-06 MED ORDER — ESTRADIOL 10 MCG VA TABS: ORAL_TABLET | VAGINAL | Status: DC

## 2015-07-06 MED ORDER — AZELASTINE HCL 0.1 % NA SOLN: 1.0000 | Freq: Two times a day (BID) | NASAL | Status: DC

## 2015-07-06 MED ORDER — IBANDRONATE SODIUM 150 MG PO TABS: 150.0000 mg | ORAL_TABLET | ORAL | Status: DC

## 2015-07-06 MED ORDER — FLUTICASONE PROPIONATE 50 MCG/ACT NA SUSP: 1.0000 | Freq: Every day | NASAL | Status: DC

## 2015-07-06 NOTE — Progress Notes (Signed)
Subjective:    Becky Bowers is a 59 y.o. MW P1 21(58 yo daughter)  female who presents for an annual exam. The patient has no complaints today. The patient is sexually active. GYN screening history: last pap: was normal. The patient wears seatbelts: yes. The patient participates in regular exercise: yes. Has the patient ever been transfused or tattooed?: no. The patient reports that there is not domestic violence in her life.   Menstrual History: OB History    No data available      Menarche age: [age] No LMP recorded. Patient is postmenopausal.    The following portions of the patient's history were reviewed and updated as appropriate: allergies, current medications, past family history, past medical history, past social history, past surgical history and problem list.  Review of Systems Pertinent items noted in HPI and remainder of comprehensive ROS otherwise negative. Monogamous for 31 years. S/P colonoscopy 1/16. Mammogram this week. DEXA today. She is a retired Runner, broadcasting/film/videoteacher.   Objective:    BP 107/63 mmHg  Pulse 53  Resp 16  Ht 5\' 3"  (1.6 m)  Wt 129 lb (58.514 kg)  BMI 22.86 kg/m2  General Appearance:    Alert, cooperative, no distress, appears stated age  Head:    Normocephalic, without obvious abnormality, atraumatic  Eyes:    PERRL, conjunctiva/corneas clear, EOM's intact, fundi    benign, both eyes  Ears:    Normal TM's and external ear canals, both ears  Nose:   Nares normal, septum midline, mucosa normal, no drainage    or sinus tenderness  Throat:   Lips, mucosa, and tongue normal; teeth and gums normal  Neck:   Supple, symmetrical, trachea midline, no adenopathy;    thyroid:  no enlargement/tenderness/nodules; no carotid   bruit or JVD  Back:     Symmetric, no curvature, ROM normal, no CVA tenderness  Lungs:     Clear to auscultation bilaterally, respirations unlabored  Chest Wall:    No tenderness or deformity   Heart:    Regular rate and rhythm, S1 and S2 normal, no  murmur, rub   or gallop  Breast Exam:    No tenderness, masses, or nipple abnormality  Abdomen:     Soft, non-tender, bowel sounds active all four quadrants,    no masses, no organomegaly  Genitalia:    Normal female without lesion, discharge or tenderness, NSSA, NT, mobile, normal adnexal exam     Extremities:   Extremities normal, atraumatic, no cyanosis or edema  Pulses:   2+ and symmetric all extremities  Skin:   Skin color, texture, turgor normal, no rashes or lesions  Lymph nodes:   Cervical, supraclavicular, and axillary nodes normal  Neurologic:   CNII-XII intact, normal strength, sensation and reflexes    throughout  .    Assessment:    Healthy female exam.    Plan:     Breast self exam technique reviewed and patient encouraged to perform self-exam monthly. Mammogram. Thin prep Pap smear.  (She is aware of ACOG recs) Refill Boniva, generic Vagifem

## 2015-07-06 NOTE — Progress Notes (Signed)
  Subjective:     Becky Bowers is a 59 y.o. female and is here for a comprehensive physical exam. The patient reports no problems.  Social History   Social History  . Marital Status: Married    Spouse Name: Ramon Dredgedward.   . Number of Children: 1  . Years of Education: N/A   Occupational History  . Retired EconomistBand Teacher    Social History Main Topics  . Smoking status: Never Smoker   . Smokeless tobacco: Never Used  . Alcohol Use: 1.2 - 2.4 oz/week    2-4 Glasses of wine per week  . Drug Use: No  . 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   Other Topics Concern  . Not on file   Social History Narrative   1 Caffeine drink per day. 5 days per week exercising. Retired.     Health Maintenance  Topic Date Due  . Hepatitis C Screening  1955-09-20  . HIV Screening  12/27/1971  . INFLUENZA VACCINE  03/08/2016  . MAMMOGRAM  06/25/2016  . PAP SMEAR  06/25/2017  . TETANUS/TDAP  01/11/2021  . COLONOSCOPY  08/15/2024    The following portions of the patient's history were reviewed and updated as appropriate: allergies, current medications, past family history, past medical history, past social history, past surgical history and problem list.  Review of Systems Pertinent items noted in HPI and remainder of comprehensive ROS otherwise negative.   Objective:    BP 112/67 mmHg  Pulse 51  Temp(Src) 98.2 F (36.8 C)  Resp 16  Wt 128 lb 6.4 oz (58.242 kg)  SpO2 100% General appearance: alert, cooperative and appears stated age Head: Normocephalic, without obvious abnormality, atraumatic Eyes: conj clear, EOMI< PEERLA Ears: normal TM's and external ear canals both ears Nose: Nares normal. Septum midline. Mucosa normal. No drainage or sinus tenderness. Throat: lips, mucosa, and tongue normal; teeth and gums normal Neck: no  adenopathy, no carotid bruit, no JVD, supple, symmetrical, trachea midline and thyroid not enlarged, symmetric, no tenderness/mass/nodules Back: symmetric, no curvature. ROM normal. No CVA tenderness. Lungs: clear to auscultation bilaterally Heart: regular rate and rhythm, S1, S2 normal, no murmur, click, rub or gallop Abdomen: soft, non-tender; bowel sounds normal; no masses,  no organomegaly Extremities: extremities normal, atraumatic, no cyanosis or edema Pulses: 2+ and symmetric Skin: Skin color, texture, turgor normal. No rashes or lesions Lymph nodes: Cervical, supraclavicular, and axillary nodes normal. Neurologic: Alert and oriented X 3, normal strength and tone. Normal symmetric reflexes. Normal coordination and gait    Assessment:    Healthy female exam.      Plan:     See After Visit Summary for Counseling Recommendations  Keep up a regular exercise program and make sure you are eating a healthy diet Try to eat 4 servings of dairy a day, or if you are lactose intolerant take a calcium with vitamin D daily.  Your vaccines are up to date.  Due for labs.

## 2015-07-06 NOTE — Patient Instructions (Signed)
Keep up a regular exercise program and make sure you are eating a healthy diet Try to eat 4 servings of dairy a day, or if you are lactose intolerant take a calcium with vitamin D daily.  Your vaccines are up to date.   

## 2015-07-07 ENCOUNTER — Other Ambulatory Visit (HOSPITAL_BASED_OUTPATIENT_CLINIC_OR_DEPARTMENT_OTHER): Payer: BC Managed Care – PPO

## 2015-07-07 LAB — LIPID PANEL
CHOL/HDL RATIO: 2.1 ratio (ref <=5.0)
Cholesterol: 170 mg/dL (ref 125–200)
HDL: 82 mg/dL (ref >=46)
LDL CALC: 75 mg/dL (ref <130)
Triglycerides: 67 mg/dL (ref <150)
VLDL: 13 mg/dL (ref <30)

## 2015-07-07 LAB — COMPLETE METABOLIC PANEL WITH GFR
ALT: 8 U/L (ref 6–29)
AST: 24 U/L (ref 10–35)
Albumin: 4.1 g/dL (ref 3.6–5.1)
Alkaline Phosphatase: 47 U/L (ref 33–130)
BUN: 14 mg/dL (ref 7–25)
CO2: 29 mmol/L (ref 20–31)
Calcium: 9 mg/dL (ref 8.6–10.4)
Chloride: 103 mmol/L (ref 98–110)
Creat: 0.72 mg/dL (ref 0.50–1.05)
GFR, Est African American: >89 mL/min (ref >=60)
GLUCOSE: 89 mg/dL (ref 65–99)
POTASSIUM: 4 mmol/L (ref 3.5–5.3)
SODIUM: 139 mmol/L (ref 135–146)
TOTAL PROTEIN: 6.1 g/dL (ref 6.1–8.1)
Total Bilirubin: 0.7 mg/dL (ref 0.2–1.2)

## 2015-07-08 ENCOUNTER — Ambulatory Visit (INDEPENDENT_AMBULATORY_CARE_PROVIDER_SITE_OTHER): Payer: BC Managed Care – PPO

## 2015-07-08 ENCOUNTER — Other Ambulatory Visit: Payer: BC Managed Care – PPO

## 2015-07-08 DIAGNOSIS — Z1231 Encounter for screening mammogram for malignant neoplasm of breast: Secondary | ICD-10-CM

## 2015-07-08 LAB — HIV ANTIBODY (ROUTINE TESTING W REFLEX): HIV 1&2 Ab, 4th Generation: NONREACTIVE

## 2015-07-08 LAB — HEPATITIS C ANTIBODY: HCV Ab: NEGATIVE

## 2015-07-09 LAB — CYTOLOGY - PAP

## 2015-12-08 ENCOUNTER — Encounter: Payer: Self-pay | Admitting: Family Medicine

## 2015-12-08 ENCOUNTER — Ambulatory Visit (INDEPENDENT_AMBULATORY_CARE_PROVIDER_SITE_OTHER): Payer: BC Managed Care – PPO | Admitting: Family Medicine

## 2015-12-08 VITALS — BP 103/68 | HR 57 | Ht 63.0 in | Wt 126.0 lb

## 2015-12-08 DIAGNOSIS — M7071 Other bursitis of hip, right hip: Secondary | ICD-10-CM | POA: Diagnosis not present

## 2015-12-08 DIAGNOSIS — M160 Bilateral primary osteoarthritis of hip: Secondary | ICD-10-CM | POA: Insufficient documentation

## 2015-12-08 NOTE — Progress Notes (Signed)
   Subjective:    I'm seeing this patient as a consultation for:  Dr. Linford ArnoldMetheney  CC: Right hip pain  HPI: Patient notes a several week history of right lateral hip pain. Pain is worse with standing from a seated position and when she rolls over in bed at night. She denies any radiating pain weakness or numbness. She denies any injury. She walks daily and does yoga weekly. She has not really tried much medicines for this. No fevers or chills.  Past medical history, Surgical history, Family history not pertinant except as noted below, Social history, Allergies, and medications have been entered into the medical record, reviewed, and no changes needed.   Review of Systems: No headache, visual changes, nausea, vomiting, diarrhea, constipation, dizziness, abdominal pain, skin rash, fevers, chills, night sweats, weight loss, swollen lymph nodes, body aches, joint swelling, muscle aches, chest pain, shortness of breath, mood changes, visual or auditory hallucinations.   Objective:    Filed Vitals:   12/08/15 1131  BP: 103/68  Pulse: 57   General: Well Developed, well nourished, and in no acute distress.  Neuro/Psych: Alert and oriented x3, extra-ocular muscles intact, able to move all 4 extremities, sensation grossly intact. Skin: Warm and dry, no rashes noted.  Respiratory: Not using accessory muscles, speaking in full sentences, trachea midline.  Cardiovascular: Pulses palpable, no extremity edema. Abdomen: Does not appear distended. MSK: Back nontender to midline. Right Hip normal appearing normal motion bilaterally. Tender palpation overlying the greater trochanter and at the iliac crest laterally. Hip abduction strength 4/5 right Equal leg length Normal gait.  No results found for this or any previous visit (from the past 24 hour(s)). No results found.  Impression and Recommendations:   This case required medical decision making of moderate complexity.

## 2015-12-08 NOTE — Patient Instructions (Signed)
Thank you for coming in today. Attend PT.  Return in 4 weeks.   Trochanteric Bursitis You have hip pain due to trochanteric bursitis. Bursitis means that the sack near the outside of the hip is filled with fluid and inflamed. This sack is made up of protective soft tissue. The pain from trochanteric bursitis can be severe and keep you from sleep. It can radiate to the buttocks or down the outside of the thigh to the knee. The pain is almost always worse when rising from the seated or lying position and with walking. Pain can improve after you take a few steps. It happens more often in people with hip joint and lumbar spine problems, such as arthritis or previous surgery. Very rarely the trochanteric bursa can become infected, and antibiotics and/or surgery may be needed. Treatment often includes an injection of local anesthetic mixed with cortisone medicine. This medicine is injected into the area where it is most tender over the hip. Repeat injections may be necessary if the response to treatment is slow. You can apply ice packs over the tender area for 30 minutes every 2 hours for the next few days. Anti-inflammatory and/or narcotic pain medicine may also be helpful. Limit your activity for the next few days if the pain continues. See your caregiver in 5-10 days if you are not greatly improved.  SEEK IMMEDIATE MEDICAL CARE IF:  You develop severe pain, fever, or increased redness.  You have pain that radiates below the knee. EXERCISES STRETCHING EXERCISES - Trochanteric Bursitis  These exercises may help you when beginning to rehabilitate your injury. Your symptoms may resolve with or without further involvement from your physician, physical therapist, or athletic trainer. While completing these exercises, remember:   Restoring tissue flexibility helps normal motion to return to the joints. This allows healthier, less painful movement and activity.  An effective stretch should be held for at  least 30 seconds.  A stretch should never be painful. You should only feel a gentle lengthening or release in the stretched tissue. STRETCH - Iliotibial Band  On the floor or bed, lie on your side so your injured leg is on top. Bend your knee and grab your ankle.  Slowly bring your knee back so that your thigh is in line with your trunk. Keep your heel at your buttocks and gently arch your back so your head, shoulders and hips line up.  Slowly lower your leg so that your knee approaches the floor/bed until you feel a gentle stretch on the outside of your thigh. If you do not feel a stretch and your knee will not fall farther, place the heel of your opposite foot on top of your knee and pull your thigh down farther.  Hold this stretch for __________ seconds.  Repeat __________ times. Complete this exercise __________ times per day. STRETCH - Hamstrings, Supine   Lie on your back. Loop a belt or towel over the ball of your foot as shown.  Straighten your knee and slowly pull on the belt to raise your injured leg. Do not allow the knee to bend. Keep your opposite leg flat on the floor.  Raise the leg until you feel a gentle stretch behind your knee or thigh. Hold this position for __________ seconds.  Repeat __________ times. Complete this stretch __________ times per day. STRETCH - Quadriceps, Prone   Lie on your stomach on a firm surface, such as a bed or padded floor.  Bend your knee and grasp your  ankle. If you are unable to reach your ankle or pant leg, use a belt around your foot to lengthen your reach.  Gently pull your heel toward your buttocks. Your knee should not slide out to the side. You should feel a stretch in the front of your thigh and/or knee.  Hold this position for __________ seconds.  Repeat __________ times. Complete this stretch __________ times per day. STRETCHING - Hip Flexors, Lunge Half kneel with your knee on the floor and your opposite knee bent and  directly over your ankle.  Keep good posture with your head over your shoulders. Tighten your buttocks to point your tailbone downward; this will prevent your back from arching too much.  You should feel a gentle stretch in the front of your thigh and/or hip. If you do not feel any resistance, slightly slide your opposite foot forward and then slowly lunge forward so your knee once again lines up over your ankle. Be sure your tailbone remains pointed downward.  Hold this stretch for __________ seconds.  Repeat __________ times. Complete this stretch __________ times per day. STRETCH - Adductors, Lunge  While standing, spread your legs.  Lean away from your injured leg by bending your opposite knee. You may rest your hands on your thigh for balance.  You should feel a stretch in your inner thigh. Hold for __________ seconds.  Repeat __________ times. Complete this exercise __________ times per day.   This information is not intended to replace advice given to you by your health care provider. Make sure you discuss any questions you have with your health care provider.   Document Released: 09/01/2004 Document Revised: 12/09/2014 Document Reviewed: 11/06/2008 Elsevier Interactive Patient Education [employer] Elsevier Inc.

## 2015-12-08 NOTE — Assessment & Plan Note (Signed)
Patient has greater trochanteric bursitis versus insertional tendinitis in the region of the greater trochanter. Plan for physical therapy and home exercise program. Recheck in 4 weeks.

## 2015-12-10 ENCOUNTER — Ambulatory Visit (INDEPENDENT_AMBULATORY_CARE_PROVIDER_SITE_OTHER): Payer: BC Managed Care – PPO | Admitting: Physical Therapy

## 2015-12-10 ENCOUNTER — Encounter: Payer: Self-pay | Admitting: Physical Therapy

## 2015-12-10 DIAGNOSIS — M6281 Muscle weakness (generalized): Secondary | ICD-10-CM | POA: Diagnosis not present

## 2015-12-10 DIAGNOSIS — M25651 Stiffness of right hip, not elsewhere classified: Secondary | ICD-10-CM

## 2015-12-10 DIAGNOSIS — M25551 Pain in right hip: Secondary | ICD-10-CM

## 2015-12-10 NOTE — Therapy (Signed)
[facility] [address] [place], [place], [address] Phone: [phone]   Fax:  [phone]  Physical Therapy Evaluation  Patient Details  Name: Becky Bowers MRN: [id number] Date of Birth: 06-02-1956 Referring Provider: Dr Denyse Amass  Encounter Date: 12/10/2015      PT End of Session - 12/10/15 1633    Visit Number 1   Number of Visits 8   Date for PT Re-Evaluation 01/07/16   PT Start Time 1543   PT Stop Time 1633   PT Time Calculation (min) 50 min      History reviewed. No pertinent past medical history.  Past Surgical History  Procedure Laterality Date  . Appendectomy  1968  . Tonsillectomy  1980  . Dilation and curettage of uterus  1990    There were no vitals filed for this visit.       Subjective Assessment - 12/10/15 1543    Subjective Pt reports she developed hip pain about 2 weeks ago, the pain is worse first thing in the AM She had a couple nights where the pain woke her.  She participates in yoga 1-2 times a week and walks about 3 miles a day 5 times a week, resistance once a week.    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 has pain with transition to stand   How long can you walk comfortably? walks through the pain, increased pain with light jog   Diagnostic tests none   Patient Stated Goals get pain as minimal as possible    Currently in Pain? No/denies  mainly with transitiing to stand            [facility] PT Assessment - 12/10/15 0001    Assessment   Medical Diagnosis Rt hip bursitis   Referring Provider Dr Denyse Amass   Onset Date/Surgical Date 11/26/15   Next MD Visit 01/06/16   Prior Therapy none   Precautions   Precautions None   Balance Screen   Has the patient fallen in the past 6 months No   Has the patient had a decrease in activity level because of a fear of falling?  No   Is the patient reluctant to leave their home because of  a fear of falling?  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    Observation/Other Assessments   Focus on Therapeutic Outcomes (FOTO)  31% limited   Posture/Postural Control   Posture/Postural Control No significant limitations   ROM / Strength   AROM / PROM / Strength AROM;Strength   AROM   AROM Assessment Site Lumbar;Hip   Right/Left Hip --  WNL   Lumbar Flexion NA due to dx   Lumbar Extension WNl   Lumbar - Right Side Bend WNL   Lumbar - Left Side Bend WNL   Lumbar - Right Rotation WNL   Lumbar - Left Rotation WNL   Strength   Strength Assessment Site Hip;Knee;Ankle;Lumbar   Right/Left Hip Right;Left   Right Hip Flexion 5/5   Right Hip Extension 4+/5  with pain in low back and buttocks.    Right Hip ABduction --  5-/5   Left Hip Flexion 5/5   Right/Left Knee  Right  Lt WNl   Right Knee Flexion 4+/5  with pain   Right Knee Extension 5/5   Right/Left Ankle --  bilat WNL   Lumbar Flexion --  TA good   Lumbar Extension --  multifidi good strong contraction of multifidi   Flexibility   Soft Tissue Assessment /Muscle Length yes   ITB tight bilat   Piriformis tight Rt    Quadratus Lumborum tight Rt    Palpation   Palpation comment tight and tender in Rt low back, QL, gluts and piriformis                    [facility] Adult PT Treatment/Exercise - 12/10/15 0001    Exercises   Exercises Knee/Hip   Knee/Hip Exercises: Stretches   Hip Flexor Stretch Both;2 reps;30 seconds  high kneel   ITB Stretch Both;2 reps;30 seconds  cross body with strap   Piriformis Stretch Both;2 reps;30 seconds   Modalities   Modalities Iontophoresis   Iontophoresis   Type of Iontophoresis Dexamethasone   Location Rt gluts    Dose 1.0cc   Time 8  hr stat patch                     PT Long Term Goals - 12/10/15 1645    PT LONG TERM GOAL #1   Title i with advanced HEP to include stretching ( 12/31/15)   Time 4   Period Weeks   Status New   PT LONG TERM GOAL #2   Title report =/> 75% reduction of pain with transitioning sit to stand ( 12/31/15)    Time 4   Period Weeks   Status New   PT LONG TERM GOAL #3   Title improve FOTO =/< 20% limited ( 12/31/15)    Time 4   Period Weeks   Status New   PT LONG TERM GOAL #4   Title demo strong contraction of TA muscle and Rt hip extension 5/5 ( 12/31/15)    Time 4   Period Weeks   Status New               Plan - 12/10/15 1639    Clinical Impression Statement 60 yo female with 2 wk h/o Rt hip pain, she also has a h/o low back pain.  She is osteopenic in her back and penia in her Lt hip. She has tightness in the Rt hip complex and low back, with some weakness in the hip and abdominal muscles.    Rehab Potential Good   PT Frequency 2x / week   PT Duration 4 weeks   PT Treatment/Interventions Ultrasound;Neuromuscular re-education;Patient/family education;Cryotherapy;Electrical Stimulation;Moist Heat;Therapeutic exercise;Manual techniques;Taping;Iontophoresis 4mg /ml Dexamethasone;Dry needling   PT Next Visit Plan TDN if pt is open to it, abdominal strengthening   Consulted and Agree with Plan of Care Patient      Patient will benefit from skilled therapeutic intervention in order to improve the following deficits and impairments:  Decreased strength, Pain, Increased muscle spasms, Hypomobility  Visit Diagnosis: Pain in right hip - Plan: PT plan of care cert/re-cert  Stiffness of right hip, not elsewhere classified - Plan: PT plan of care cert/re-cert  Muscle weakness (generalized) - Plan: PT plan of care cert/re-cert     Problem List Patient Active Problem List   Diagnosis Date Noted  . Bursitis of right hip 12/08/2015  . Eustachian tube dysfunction  02/12/2015  . HEADACHE 08/12/2010  . LUMBAGO 01/07/2008  Roderic Scarce PT 12/10/2015, 4:48 PM  [facility] [address] [place], [place], [address] Phone: [phone]   Fax:  [phone]  Name: Becky Bowers MRN: [id number] Date of Birth: [birth year]

## 2015-12-10 NOTE — Patient Instructions (Addendum)
Piriformis Stretch, Supine      [place] [phone]    Lie supine, one ankle crossed onto opposite knee. Holding bottom leg behind knee, gently pull legs toward chest until stretch is felt in buttock of top leg. Hold _10 breaths or 30-45__ seconds. For deeper stretch gently push top knee away from body.  Repeat _2__ times per session. Do _1__ sessions per day.  Outer Hip Stretch: Reclined IT Band Stretch (Strap)   ( stretch out strap-  [employer])     Strap around opposite foot, pull across only as far as possible with shoulders on mat. Hold for __10__ breaths, 30-45 sec. Repeat 2 times on each leg.   Hip Flexor Stretch: Proposal Pose can take hand on the same side that knee is down and reach around the outside of opposite knee for deeper stretch.     Maintain pelvic tilt, lift pubic bone toward navel. Engage posterior hip muscles (firm glute muscles of leg in back position). To increase stretch, maintain balance and ease hips forward. Hold for __10__ breaths, 30-45 sec. Repeat _2___ times each leg.  Copyright  VHI. All rights reserved.     Trigger Point Dry Needling  . What is Trigger Point Dry Needling (DN)? o DN is a physical therapy technique used to treat muscle pain and dysfunction. Specifically, DN helps deactivate muscle trigger points (muscle knots).  o A thin filiform needle is used to penetrate the skin and stimulate the underlying trigger point. The goal is for a local twitch response (LTR) to occur and for the trigger point to relax. No medication of any kind is injected during the procedure.   . What Does Trigger Point Dry Needling Feel Like?  o The procedure feels different for each individual patient. Some patients report that they do not actually feel the needle enter the skin and overall the process is not painful. Very mild bleeding may occur. However, many patients feel a deep cramping in the muscle in which the needle was inserted. This is the local twitch  response.   [name]. How Will I feel after the treatment? o Soreness is normal, and the onset of soreness may not occur for a few hours. Typically this soreness does not last longer than two days.  o Bruising is uncommon, however; ice can be used to decrease any possible bruising.  o In rare cases feeling tired or nauseous after the treatment is normal. In addition, your symptoms may get worse before they get better, this period will typically not last longer than 24 hours.   . What Can I do After My Treatment? o Increase your hydration by drinking more water for the next 24 hours. o You may place ice or heat on the areas treated that have become sore, however, do not use heat on inflamed or bruised areas. Heat often brings more relief post needling. o You can continue your regular activities, but vigorous activity is not recommended initially after the treatment for 24 hours. o DN is best combined with other physical therapy such as strengthening, stretching, and other therapies.

## 2015-12-15 ENCOUNTER — Ambulatory Visit (INDEPENDENT_AMBULATORY_CARE_PROVIDER_SITE_OTHER): Payer: BC Managed Care – PPO | Admitting: Physical Therapy

## 2015-12-15 ENCOUNTER — Encounter: Payer: Self-pay | Admitting: Physical Therapy

## 2015-12-15 DIAGNOSIS — M25551 Pain in right hip: Secondary | ICD-10-CM

## 2015-12-15 DIAGNOSIS — M6281 Muscle weakness (generalized): Secondary | ICD-10-CM

## 2015-12-15 DIAGNOSIS — M25651 Stiffness of right hip, not elsewhere classified: Secondary | ICD-10-CM | POA: Diagnosis not present

## 2015-12-15 NOTE — Patient Instructions (Signed)
  Abdominal Bracing With Pelvic Floor (Hook-Lying)   With neutral spine, tighten pelvic floor and abdominals. Hold 5 seconds. Repeat __10_ times. Do _1__ times a day. Can use buzzing sound to help.   Knee to Chest: Transverse Plane Stability   Bring one knee up, then return. Be sure pelvis does not roll side to side. Keep pelvis still. Lift knee __10_ times each leg. Restabilize pelvis. Repeat with other leg. Do _1-2__ sets, _1__ times per day.  Hip External Rotation: Transverse Plane Stability   One knee bent, one leg straight. Slowly roll bent knee out. Be sure pelvis does not rotate. Do _10__ times. Restabilize pelvis. Repeat with other leg. Do _1-2__ sets, _1__ times per day.  Heel Slide: 4-10 Inches - Transverse Plane Stability   Slide heel 4 inches down. Be sure pelvis does not rotate. Do _10__ times. Restabilize pelvis. Repeat with other leg. Do __1_ sets, _1__ times per day.   [facility]  [address] [place], [place] [address]  [phone] (office) [phone] (fax)

## 2015-12-15 NOTE — Therapy (Signed)
[place], [place], [address] Phone: [phone]   Fax:  [phone]  Physical Therapy Treatment  Patient Details  Name: Becky Bowers Bowers MRN: [id number] Date of Birth: 06-07-1956 Referring Provider: Dr Becky Bowers Bowers  Encounter Date: 12/15/2015      PT End of Session - 12/15/15 0802    Visit Number 2   Number of Visits 8   Date for PT Re-Evaluation 01/07/16   PT Start Time 0802   PT Stop Time 0910   PT Time Calculation (min) 68 min   Activity Tolerance Patient tolerated treatment well      History reviewed. No pertinent past medical history.  Past Surgical History  Procedure Laterality Date  . Appendectomy  1968  . Tonsillectomy  1980  . Dilation and curettage of uterus  1990    There were no vitals filed for this visit.      Subjective Assessment - 12/15/15 0802    Subjective Pt reports the patch helped on Friday, Saturday the pain returned.  Not sure if the stretches have helped, maybe to soon. Went to yoga and the instructor was concerned about some of the poses.    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 No/denies            [facility] PT Assessment - 12/15/15 0001    Assessment   Medical Diagnosis Rt hip bursitis   Referring Provider Dr Becky Bowers Bowers   Onset Date/Surgical Date 11/26/15   Flexibility   ITB continued tightness   Piriformis better after needling   Quadratus Lumborum better after needling                     [facility] Adult PT Treatment/Exercise - 12/15/15 0001    Self-Care   Self-Care Other Self-Care Comments  reviewed poses in her yoga class and discussed safe poses    Exercises   Exercises Lumbar   Lumbar Exercises: Stretches   Lower Trunk Rotation 5 reps   ITB Stretch 30 seconds  cross body with strap   Piriformis Stretch 30 seconds   Lumbar Exercises: Supine   Ab Set 10 reps  5 sec holds with kegels   Clam 10  reps   Heel Slides 10 reps   Bent Knee Raise 10 reps   Modalities   Modalities Electrical Stimulation;Iontophoresis;Moist Heat   Moist Heat Therapy   Number Minutes Moist Heat 15 Minutes   Moist Heat Location Lumbar Spine   Electrical Stimulation   Electrical Stimulation Location lumbar   Electrical Stimulation Action IFC    Electrical Stimulation Parameters to tolerance   Electrical Stimulation Goals Pain;Tone   Iontophoresis   Type of Iontophoresis Dexamethasone   Location Rt gluts    Dose 1.0cc   Time 8 hr stat patch   Manual Therapy   Manual Therapy Soft tissue mobilization   Soft tissue mobilization R QL, lumbar parsapinals and gluts, TPR          Trigger Point Dry Needling - 12/15/15 0820    Consent Given? Yes   Education Handout Provided Yes   Muscles Treated Upper Body Longissimus;Quadratus Lumborum;Piriformis  glut med   Longissimus Response Palpable increased muscle length;Twitch response elicited              PT Education - 12/15/15 0815    Education provided Yes   Education Details TA work    [employer]) Educated Patient  Methods Explanation;Handout   Comprehension Returned demonstration;Verbalized understanding             PT Long Term Goals - 12/15/15 9179    PT LONG TERM GOAL #1   Title i with advanced HEP to include stretching ( 12/31/15)   Time 4   Status On-going   PT LONG TERM GOAL #2   Title report =/> 75% reduction of pain with transitioning sit to stand ( 12/31/15)    Time 4   Period Weeks   Status On-going   PT LONG TERM GOAL #3   Title improve FOTO =/< 20% limited ( 12/31/15)    Time 4   Period Weeks   Status On-going   PT LONG TERM GOAL #4   Title demo strong contraction of TA muscle and Rt hip extension 5/5 ( 12/31/15)    Time 4   Period Weeks   Status On-going               Plan - 12/15/15 1505    Clinical Impression Statement This is Becky Bowers's second visit, no goals met as of today.  She tolerated new HEP well  and had good questions about her yoga poses. good response to TDN   Rehab Potential Good   PT Frequency 2x / week   PT Duration 4 weeks   PT Treatment/Interventions Ultrasound;Neuromuscular re-education;Patient/family education;Cryotherapy;Electrical Stimulation;Moist Heat;Therapeutic exercise;Manual techniques;Taping;Iontophoresis 27m/ml Dexamethasone;Dry needling   PT Next Visit Plan assess reponse to TDN, abdominal stab ex.    Consulted and Agree with Plan of Care Patient      Patient will benefit from skilled therapeutic intervention in order to improve the following deficits and impairments:  Decreased strength, Pain, Increased muscle spasms, Hypomobility  Visit Diagnosis: Muscle weakness (generalized)  Stiffness of right hip, not elsewhere classified  Pain in right hip     Problem List Patient Active Problem List   Diagnosis Date Noted  . Bursitis of right hip 12/08/2015  . Eustachian tube dysfunction 02/12/2015  . HEADACHE 08/12/2010  . LUMBAGO 01/07/2008    Becky Bowers PinchPT 12/15/2015, 8:49 AM  [facility] [address]Phone: [phone]  Fax:  [phone] Name: Becky Bowers BARTEEMRN: [id number] of Birth: 5May 12, 1957

## 2015-12-17 ENCOUNTER — Ambulatory Visit (INDEPENDENT_AMBULATORY_CARE_PROVIDER_SITE_OTHER): Payer: BC Managed Care – PPO | Admitting: Physical Therapy

## 2015-12-17 DIAGNOSIS — M25551 Pain in right hip: Secondary | ICD-10-CM | POA: Diagnosis not present

## 2015-12-17 DIAGNOSIS — M6281 Muscle weakness (generalized): Secondary | ICD-10-CM

## 2015-12-17 DIAGNOSIS — M25651 Stiffness of right hip, not elsewhere classified: Secondary | ICD-10-CM

## 2015-12-17 NOTE — Therapy (Signed)
[facility] [address] [place], [place], [address] Phone: [phone]   Fax:  [phone]  Physical Therapy Treatment  Patient Details  Name: Becky Bowers MRN: [id number] Date of Birth: [birth year] Referring Provider: Dr. Denyse Amassorey  Encounter Date: 12/17/2015      PT End of Session - 12/17/15 0847    Visit Number 3   Number of Visits 8   Date for PT Re-Evaluation 01/07/16   PT Start Time 0847   PT Stop Time 0948   PT Time Calculation (min) 61 min      No past medical history on file.  Past Surgical History  Procedure Laterality Date  . Appendectomy  1968  . Tonsillectomy  1980  . Dilation and curettage of uterus  1990    There were no vitals filed for this visit.      Subjective Assessment - 12/17/15 0851    Subjective Pt reports she feels pretty good today; thinks it is due to dry needling last session and massage yesterday.    Pertinent History osteopenia in spine, osteoporosis in femur    Patient Stated Goals get pain as minimal as possible    Currently in Pain? Yes   Pain Score 1    Pain Location Hip   Pain Orientation Right   Pain Descriptors / Indicators Dull  stiffness    Pain Frequency Intermittent   Aggravating Factors  sitting still too long   Pain Relieving Factors walking             [facility] PT Assessment - 12/17/15 0001    Assessment   Medical Diagnosis Rt hip bursitis   Referring Provider Dr. Denyse Amassorey   Onset Date/Surgical Date 11/26/15   Next MD Visit 01/06/16   Prior Therapy none            [facility] Adult PT Treatment/Exercise - 12/17/15 0001    Lumbar Exercises: Stretches   Passive Hamstring Stretch 1 rep;30 seconds   Lower Trunk Rotation 5 reps   ITB Stretch 30 seconds  cross body with strap, adductor stretch x 30 sec    Piriformis Stretch 30 seconds;3 reps  each leg   Piriformis Stretch Limitations (pt shown to draw knee different angles towards chest)    Lumbar  Exercises: Aerobic   Stationary Bike NuStep L5: 4   Lumbar Exercises: Supine   Ab Set 10 reps;5 seconds   Clam 10 reps  with ab set   Heel Slides 10 reps  with ab set   Bent Knee Raise 10 reps  with ab set   Bent Knee Raise Limitations (then with added opp arm/leg)   Moist Heat Therapy   Number Minutes Moist Heat 15 Minutes   Moist Heat Location Lumbar Spine;Hip   Electrical Stimulation   Electrical Stimulation Location Rt posterior hip    Electrical Stimulation Action IFC   Electrical Stimulation Parameters  to tolerance    Electrical Stimulation Goals Pain;Tone   Iontophoresis   Type of Iontophoresis Dexamethasone   Location Rt glute med    Dose 1.0cc    Time 8 hr stat patch            PT Long Term Goals - 12/15/15 [id number]    PT LONG TERM GOAL #1   Title i with advanced HEP to include stretching ( 12/31/15)   Time 4   Status On-going   PT LONG TERM GOAL #2   Title report =/> 75% reduction of pain with  transitioning sit to stand ( 12/31/15)    Time 4   Period Weeks   Status On-going   PT LONG TERM GOAL #3   Title improve FOTO =/< 20% limited ( 12/31/15)    Time 4   Period Weeks   Status On-going   PT LONG TERM GOAL #4   Title demo strong contraction of TA muscle and Rt hip extension 5/5 ( 12/31/15)    Time 4   Period Weeks   Status On-going               Plan - 12/17/15 1610    Clinical Impression Statement Pt tolerated all exercises, reporting reduction in stiffness with ther ex.  Pt required some visual and tactile cues to improve form of supine core exercises. Progressing towards goals.    Rehab Potential Good   PT Frequency 2x / week   PT Duration 4 weeks   PT Treatment/Interventions Ultrasound;Neuromuscular re-education;Patient/family education;Cryotherapy;Electrical Stimulation;Moist Heat;Therapeutic exercise;Manual techniques;Taping;Iontophoresis /ml Dexamethasone;Dry needling   PT Next Visit Plan Continue progressive core strengthening exercise,  adding hip ext strengthening; manual therapy and modalities as indicated.    Consulted and Agree with Plan of Care Patient      Patient will benefit from skilled therapeutic intervention in order to improve the following deficits and impairments:  Decreased strength, Pain, Increased muscle spasms, Hypomobility  Visit Diagnosis: Muscle weakness (generalized)  Stiffness of right hip, not elsewhere classified  Pain in right hip     Problem List Patient Active Problem List   Diagnosis Date Noted  . Bursitis of right hip 12/08/2015  . Eustachian tube dysfunction 02/12/2015  . HEADACHE 08/12/2010  . LUMBAGO 01/07/2008   Mayer Camel, PTA 12/17/2015 9:40 AM   [facility] [address] [place], [place], [address] Phone: [phone]   Fax:  [phone]  Name: Becky Bowers MRN: [id number] Date of Birth: [birth year]

## 2015-12-22 ENCOUNTER — Encounter: Payer: Self-pay | Admitting: Physical Therapy

## 2015-12-22 ENCOUNTER — Ambulatory Visit (INDEPENDENT_AMBULATORY_CARE_PROVIDER_SITE_OTHER): Payer: BC Managed Care – PPO | Admitting: Physical Therapy

## 2015-12-22 DIAGNOSIS — M25551 Pain in right hip: Secondary | ICD-10-CM

## 2015-12-22 DIAGNOSIS — M25651 Stiffness of right hip, not elsewhere classified: Secondary | ICD-10-CM | POA: Diagnosis not present

## 2015-12-22 DIAGNOSIS — M6281 Muscle weakness (generalized): Secondary | ICD-10-CM

## 2015-12-22 NOTE — Patient Instructions (Addendum)
Toe Touch    [place] [phone]  Stretch out strap, on [employer]    Lie on back, legs folded to chest, arms by sides. Exhale, lowering leg to just touch toes to mat. Inhale, returning knee to chest. Keep abdominals flat, navel to spine. Repeat _10-20___ times, alternating legs. Do _1___ sessions per day.  Scissor -  If you feel this in your back , make smaller scissors.     Lie on back, legs straight up, hands under lower hips. Exhale, splitting legs with a pulse. Inhale, changing legs. Keep hips flat. Do not let bottom leg go below 45. Repeat _10-20___ times. Do __1__ sessions per day. NOTE: Keep navel to spine, back flat.  Single Leg Circle  - keep knee straight, non-working leg can be straight to make it harder    Lie on back, one leg bent, other leg straight up. Inhale, circling leg across body, and exhale while circling down and around to beginning. Maintain still pelvis; avoid rocking. Keep circle small. Repeat __10-20__ times clockwise, then counterclockwise. Repeat with other leg. Do __1__ sessions per day.

## 2015-12-22 NOTE — Therapy (Signed)
[place], [place], [address] Phone: [phone]   Fax:  [phone]  Physical Therapy Treatment  Patient Details  Name: Becky Bowers MRN: [id number] Date of Birth: 1955-09-29 Referring Provider: Dr Georgina Snell  Encounter Date: 12/22/2015      PT End of Session - 12/22/15 0807    Visit Number 4   Number of Visits 8   Date for PT Re-Evaluation 01/07/16   PT Start Time 0805   PT Stop Time 0903   PT Time Calculation (min) 58 min   Activity Tolerance Patient tolerated treatment well      History reviewed. No pertinent past medical history.  Past Surgical History  Procedure Laterality Date  . Appendectomy  1968  . Tonsillectomy  1980  . Dilation and curettage of uterus  1990    There were no vitals filed for this visit.      Subjective Assessment - 12/22/15 0805    Subjective Becky Bowers said she felt better until last night. She was on the floor working and leaning forward, when she attempted to stand the pain was back. Before that she wasn't having pain.    Currently in Pain? Yes   Pain Score 3    Pain Location Back   Pain Orientation Right   Pain Descriptors / Indicators Aching;Dull   Pain Type Acute pain   Pain Radiating Towards into the Rt groin.    Pain Frequency Intermittent   Aggravating Factors  sitting and leaning  forward   Pain Relieving Factors walking and moving around            [facility] PT Assessment - 12/22/15 0001    Assessment   Medical Diagnosis Rt hip bursitis   Referring Provider Dr Georgina Snell   Onset Date/Surgical Date 11/26/15   Next MD Visit 01/06/16   Strength   Right/Left Hip Right;Left   Right Hip Extension 5/5   Left Hip Extension 5/5                     [facility] Adult PT Treatment/Exercise - 12/22/15 0001    Lumbar Exercises: Stretches   ITB Stretch 30 seconds  hip abductors and adductor stretch   Lumbar Exercises: Aerobic   Stationary Bike Nustep  L4x5'   Lumbar Exercises: Supine   Ab Set 5 reps   Isometric Hip Flexion 10 reps  ispolateral arm and contralateral    Other Supine Lumbar Exercises pilates toe touches, scissors, single leg circles.    Modalities   Modalities Electrical Stimulation;Iontophoresis;Moist Heat   Moist Heat Therapy   Number Minutes Moist Heat 15 Minutes   Moist Heat Location Lumbar Spine;Hip   Electrical Stimulation   Electrical Stimulation Location Rt posterior hip    Electrical Stimulation Action IFC   Electrical Stimulation Parameters to tolerance   Electrical Stimulation Goals Pain;Tone   Iontophoresis   Type of Iontophoresis Dexamethasone   Location Rt glute med    Dose 1.0cc    Time 8 hr stat patch                 PT Education - 12/22/15 0827    Education provided Yes   Education Details pilates abdominal work   [employer]) Educated Patient   Methods Explanation;Handout   Comprehension Returned demonstration;Verbalized understanding             PT Long Term Goals - 12/22/15 0808    PT LONG TERM GOAL #1  Title i with advanced HEP to include stretching ( 12/31/15)   Status Achieved   PT LONG TERM GOAL #2   Title report =/> 75% reduction of pain with transitioning sit to stand ( 12/31/15)    Time 4   Period Weeks   Status On-going  was 95% until last night sitting on the floor, now 70-75%   PT LONG TERM GOAL #3   Title improve FOTO =/< 20% limited ( 12/31/15)    Time 4   Period Weeks   Status On-going   PT LONG TERM GOAL #4   Title demo strong contraction of TA muscle and Rt hip extension 5/5 ( 12/31/15)    Time 4   Period Weeks   Status Partially Met  hip extension 5/5               Plan - 12/22/15 0828    Clinical Impression Statement Becky Bowers has been doing very well, even with her small set back last night.  She has met one goal, partailly met another and progressing the others.  Her hips are stronger and her abdominal muscles are improving as well however  fatigue quickly.    Rehab Potential Good   PT Frequency 2x / week   PT Duration 4 weeks   PT Treatment/Interventions Ultrasound;Neuromuscular re-education;Patient/family education;Cryotherapy;Electrical Stimulation;Moist Heat;Therapeutic exercise;Manual techniques;Taping;Iontophoresis 28m/ml Dexamethasone;Dry needling   PT Next Visit Plan Continue progressive core strengthening exercise,  manual therapy and modalities as indicated.    Consulted and Agree with Plan of Care Patient      Patient will benefit from skilled therapeutic intervention in order to improve the following deficits and impairments:  Decreased strength, Pain, Increased muscle spasms, Hypomobility  Visit Diagnosis: Pain in right hip  Stiffness of right hip, not elsewhere classified  Muscle weakness (generalized)     Problem List Patient Active Problem List   Diagnosis Date Noted  . Bursitis of right hip 12/08/2015  . Eustachian tube dysfunction 02/12/2015  . HEADACHE 08/12/2010  . LUMBAGO 01/07/2008    SJeral PinchPT 12/22/2015, 8:51 AM  [facility] [place] [address]Phone: [phone]  Fax:  [phone] Name: Becky JOSHIMRN: [id number] of Birth: [birth year]

## 2015-12-25 ENCOUNTER — Ambulatory Visit (INDEPENDENT_AMBULATORY_CARE_PROVIDER_SITE_OTHER): Payer: BC Managed Care – PPO | Admitting: Physical Therapy

## 2015-12-25 DIAGNOSIS — M6281 Muscle weakness (generalized): Secondary | ICD-10-CM

## 2015-12-25 DIAGNOSIS — M25551 Pain in right hip: Secondary | ICD-10-CM

## 2015-12-25 DIAGNOSIS — M25651 Stiffness of right hip, not elsewhere classified: Secondary | ICD-10-CM | POA: Diagnosis not present

## 2015-12-25 NOTE — Therapy (Signed)
[place], [place], [address] Phone: [phone]   Fax:  [phone]  Physical Therapy Treatment  Patient Details  Name: Becky Bowers MRN: [id number] Date of Birth: 1956/06/29 Referring Provider: Dr. Georgina Snell  Encounter Date: 12/25/2015      PT End of Session - 12/25/15 0809    Visit Number 5   Number of Visits 8   Date for PT Re-Evaluation 01/07/16   PT Start Time 0804   PT Stop Time 8003   PT Time Calculation (min) 53 min   Activity Tolerance Patient tolerated treatment well;No increased pain      No past medical history on file.  Past Surgical History  Procedure Laterality Date  . Appendectomy  1968  . Tonsillectomy  1980  . Dilation and curettage of uterus  1990    There were no vitals filed for this visit.      Subjective Assessment - 12/25/15 0809    Subjective Pt reports she is noticing new areas of discomfort since beginning therapy, ie: midback and shoulders.    Pertinent History osteopenia in spine, osteoporosis in femur    Patient Stated Goals get pain as minimal as possible    Currently in Pain? Yes   Pain Score 2    Pain Location Back   Pain Orientation Right;Lower   Aggravating Factors  leaning forward   Pain Relieving Factors walking and moving around   Multiple Pain Sites Yes   Pain Score 4   Pain Location Back   Pain Orientation Upper   Pain Descriptors / Indicators Sharp   Aggravating Factors  reaching    Pain Relieving Factors avoiding certain motions            [facility] PT Assessment - 12/25/15 0001    Assessment   Medical Diagnosis Rt hip bursitis   Referring Provider Dr. Georgina Snell   Onset Date/Surgical Date 11/26/15   Next MD Visit 01/06/16           [place] Adult PT Treatment/Exercise - 12/25/15 0001    Lumbar Exercises: Stretches   Lower Trunk Rotation 5 reps   Piriformis Stretch 30 seconds;3 reps  each leg   Lumbar Exercises: Aerobic   Stationary Bike Nustep arms and legs: L5x5'   Lumbar Exercises: Supine   Other Supine Lumbar Exercises pilates toe touches, scissors, single leg circles. tactile cues and demonstration to improve form.     Lumbar Exercises: Prone   Opposite Arm/Leg Raise Right arm/Left leg;Left arm/Right leg;10 reps - VC to slow down and engage core.   Lumbar Exercises: Quadruped   Opposite Arm/Leg Raise Right arm/Left leg;Left arm/Right leg;5 reps;5 seconds  on green therapy ball.    Knee/Hip Exercises: Seated   Other Seated Knee/Hip Exercises Seated on green therapy ball:  gentle pelvic tilts and circles in all directions (some pain with tilt to Rt);  seated marching on ball with core engaged. posterior lean with arm flexion to 90, marching with arms flexion to 90 deg x 10    Moist Heat Therapy   Number Minutes Moist Heat 15 Minutes   Moist Heat Location Lumbar Spine  thoracic spine   Electrical Stimulation   Electrical Stimulation Location Rt posterior hip and midback paraspinals   Electrical Stimulation Action pre mod to each area   Electrical Stimulation Parameters to tolerance    Electrical Stimulation Goals Pain;Tone           PT Long Term Goals - 12/22/15  0808    PT LONG TERM GOAL #1   Title i with advanced HEP to include stretching ( 12/31/15)   Status Achieved   PT LONG TERM GOAL #2   Title report =/> 75% reduction of pain with transitioning sit to stand ( 12/31/15)    Time 4   Period Weeks   Status On-going  was 95% until last night sitting on the floor, now 70-75%   PT LONG TERM GOAL #3   Title improve FOTO =/< 20% limited ( 12/31/15)    Time 4   Period Weeks   Status On-going   PT LONG TERM GOAL #4   Title demo strong contraction of TA muscle and Rt hip extension 5/5 ( 12/31/15)    Time 4   Period Weeks   Status Partially Met  hip extension 5/5               Plan - 12/25/15 1143    Clinical Impression Statement Pt required some VC and tactile cues for improved form  with core and spinal stabilization exercises today.  No increase in symptoms with ther ex.   Pain decreased at end of session with use of estim and MHP.  Progressing towards goals.    Rehab Potential Good   PT Frequency 2x / week   PT Duration 4 weeks   PT Treatment/Interventions Ultrasound;Neuromuscular re-education;Patient/family education;Cryotherapy;Electrical Stimulation;Moist Heat;Therapeutic exercise;Manual techniques;Taping;Iontophoresis 77m/ml Dexamethasone;Dry needling   PT Next Visit Plan Continue progressive core strengthening exercise,  manual therapy and modalities as indicated.    Consulted and Agree with Plan of Care Patient      Patient will benefit from skilled therapeutic intervention in order to improve the following deficits and impairments:  Decreased strength, Pain, Increased muscle spasms, Hypomobility  Visit Diagnosis: Pain in right hip  Stiffness of right hip, not elsewhere classified  Muscle weakness (generalized)     Problem List Patient Active Problem List   Diagnosis Date Noted  . Bursitis of right hip 12/08/2015  . Eustachian tube dysfunction 02/12/2015  . HEADACHE 08/12/2010  . LUMBAGO 01/07/2008   JKerin Perna PTA 12/25/2015 11:45 AM   [facility] [address]Phone: [phone]  Fax:  [phone] Name: PGABBIE MARZOMRN: [id number] of Birth: 506-07-1956

## 2015-12-28 ENCOUNTER — Ambulatory Visit (INDEPENDENT_AMBULATORY_CARE_PROVIDER_SITE_OTHER): Payer: BC Managed Care – PPO | Admitting: Physical Therapy

## 2015-12-28 DIAGNOSIS — M25551 Pain in right hip: Secondary | ICD-10-CM

## 2015-12-28 DIAGNOSIS — M25651 Stiffness of right hip, not elsewhere classified: Secondary | ICD-10-CM | POA: Diagnosis not present

## 2015-12-28 DIAGNOSIS — M6281 Muscle weakness (generalized): Secondary | ICD-10-CM | POA: Diagnosis not present

## 2015-12-28 NOTE — Therapy (Signed)
[facility] [address] [place], [place], [address] Phone: [phone]   Fax:  [phone]  Physical Therapy Treatment  Patient Details  Name: Becky Bowers MRN: [id number] Date of Birth: 08/19/1955 Referring Provider: Dr. Corey  Encounter Date: 12/28/2015      PT End of Session - 12/28/15 0806    Visit Number 6   Number of Visits 8   Date for PT Re-Evaluation 01/07/16   PT Start Time 0801   PT Stop Time 0904   PT Time Calculation (min) 63 min   Activity Tolerance Patient limited by pain      No past medical history on file.  Past Surgical History  Procedure Laterality Date  . Appendectomy  1968  . Tonsillectomy  1980  . Dilation and curettage of uterus  1990    There were no vitals filed for this visit.      Subjective Assessment - 12/28/15 0807    Subjective Pt reports her neck is now bothering her. "I think the pain is moving around".  Her hip and back are calming down.     Currently in Pain? Yes   Pain Score 1    Pain Location Back   Pain Orientation Right;Lower   Pain Descriptors / Indicators Dull   Pain Radiating Towards into Rt hip    Aggravating Factors  jogging?   Pain Relieving Factors walking and moving around    Multiple Pain Sites Yes   Pain Score 5   Pain Location Shoulder   Pain Orientation Left;Upper   Pain Descriptors / Indicators Sharp   Aggravating Factors  reaching    Pain Relieving Factors avoiding certain motions             [facility] PT Assessment - 12/28/15 0001    Assessment   Medical Diagnosis Rt hip bursitis   Referring Provider Dr. Corey   Onset Date/Surgical Date 11/26/15   Next MD Visit 01/06/16          [facility] Adult PT Treatment/Exercise - 12/28/15 0001    Lumbar Exercises: Aerobic   Stationary Bike Nustep arms and legs: L4 x 5'   Lumbar Exercises: Supine   Isometric Hip Flexion 10 reps  Contralateral holding 3-5 sec each side.    Other Supine Lumbar  Exercises Hooklying on 1/2 foam roller:  snow angels x 10 reps; then shoulder flexion (alternating) to 180 deg, then opp arm/ hip flexion x 10 with core engaged.  D2 shoulder flexion with red band and core engaged x 5 reps each side x 2 sets;  shoulder horiz abd with red band x 10 reps    Moist Heat Therapy   Number Minutes Moist Heat 15 Minutes   Moist Heat Location Lumbar Spine;Cervical  thoracic spine   Electrical Stimulation   Electrical Stimulation Location Rt posterior hip and Lt midback paraspinals   Electrical Stimulation Action pre mod to each area   Electrical Stimulation Parameters to tolerance    Electrical Stimulation Goals Tone;Pain   Manual Therapy   Manual Therapy Soft tissue mobilization   Soft tissue mobilization Rt QL, glute, Lt/Rt thoracic paraspinals - TPR and cross fiber friction            PT Long Term Goals - 12/22/15 0808    PT LONG TERM GOAL #1   Title i with advanced HEP to include stretching ( 12/31/15)   Status Achieved   PT LONG TERM GOAL #2   Title report =/>   75% reduction of pain with transitioning sit to stand ( 12/31/15)    Time 4   Period Weeks   Status On-going  50% reduction reported on 12/28/15   PT LONG TERM GOAL #3   Title improve FOTO =/< 20% limited ( 12/31/15)    Time 4   Period Weeks   Status On-going   PT LONG TERM GOAL #4   Title demo strong contraction of TA muscle and Rt hip extension 5/5 ( 12/31/15)    Time 4   Period Weeks   Status Partially Met  hip extension 5/5               Plan - 12/28/15 8338    Clinical Impression Statement Pt presents with decreased hip pain, but increased upper back/neck pain. This limited positions for ther ex due to pain.  Held off prone positions as this may have flared up her back pain last session.  Pt reported decreased pain with use of estim and MHP at end of session.   Pt progressing towards established goals.    Rehab Potential Good   PT Frequency 2x / week   PT Duration 4 weeks   PT  Treatment/Interventions Ultrasound;Neuromuscular re-education;Patient/family education;Cryotherapy;Electrical Stimulation;Moist Heat;Therapeutic exercise;Manual techniques;Taping;Iontophoresis 8m/ml Dexamethasone;Dry needling   PT Next Visit Plan Continue progressive core strengthening exercise,  manual therapy and modalities as indicated.    Consulted and Agree with Plan of Care Patient      Patient will benefit from skilled therapeutic intervention in order to improve the following deficits and impairments:  Decreased strength, Pain, Increased muscle spasms, Hypomobility  Visit Diagnosis: Pain in right hip  Stiffness of right hip, not elsewhere classified  Muscle weakness (generalized)     Problem List Patient Active Problem List   Diagnosis Date Noted  . Bursitis of right hip 12/08/2015  . Eustachian tube dysfunction 02/12/2015  . HEADACHE 08/12/2010  . LUMBAGO 01/07/2008   JKerin Perna PTA 12/28/2015 9:16 AM  [facility] [place] [address]Phone: [phone]  Fax:  [phone] Name: PTASMINE HIPWELLMRN: [id number] of Birth: 529-Nov-1957

## 2015-12-31 ENCOUNTER — Ambulatory Visit (INDEPENDENT_AMBULATORY_CARE_PROVIDER_SITE_OTHER): Payer: BC Managed Care – PPO | Admitting: Physical Therapy

## 2015-12-31 DIAGNOSIS — M25551 Pain in right hip: Secondary | ICD-10-CM | POA: Diagnosis not present

## 2015-12-31 DIAGNOSIS — M6281 Muscle weakness (generalized): Secondary | ICD-10-CM

## 2015-12-31 DIAGNOSIS — M25651 Stiffness of right hip, not elsewhere classified: Secondary | ICD-10-CM

## 2015-12-31 NOTE — Patient Instructions (Signed)
Over Head Pull: Narrow Grip     [place] [id number], perform 4-5 times a week    On back, knees bent, feet flat, band across thighs, elbows straight but relaxed. Pull hands apart (start). Keeping elbows straight, bring arms up and over head, hands toward floor. Keep pull steady on band. Hold momentarily. Return slowly, keeping pull steady, back to start. Repeat _2-3x10__ times. Band color __red____   Side Pull: Double Arm   On back, knees bent, feet flat. Arms perpendicular to body, shoulder level, elbows straight but relaxed. Pull arms out to sides, elbows straight. Resistance band comes across collarbones, hands toward floor. Hold momentarily. Slowly return to starting position. Repeat 2-3x10___ times. Band color __red___   Elisabeth CaraSash   On back, knees bent, feet flat, left hand on left hip, right hand above left. Pull right arm DIAGONALLY (hip to shoulder) across chest. Bring right arm along head toward floor. Hold momentarily. Slowly return to starting position. Repeat _2-3x10__ times. Do with left arm. Band color __red____   Shoulder Rotation: Double Arm   On back, knees bent, feet flat, elbows tucked at sides, bent 90, hands palms up. Pull hands apart and down toward floor, keeping elbows near sides. Hold momentarily. Slowly return to starting position. Repeat _2-3x10__ times. Band color __red____

## 2015-12-31 NOTE — Therapy (Signed)
[place], [place], [address] Phone: [phone]   Fax:  [phone]  Physical Therapy Treatment  Patient Details  Name: Becky Bowers MRN: [id number] Date of Birth: Apr 06, 1956 Referring Provider: Dr. Georgina Snell  Encounter Date: 12/31/2015      PT End of Session - 12/31/15 0805    Visit Number 7   Number of Visits 8   Date for PT Re-Evaluation 01/07/16   PT Start Time 0805   PT Stop Time 0905   PT Time Calculation (min) 60 min   Activity Tolerance Patient tolerated treatment well      No past medical history on file.  Past Surgical History  Procedure Laterality Date  . Appendectomy  1968  . Tonsillectomy  1980  . Dilation and curettage of uterus  1990    There were no vitals filed for this visit.      Subjective Assessment - 12/31/15 0806    Subjective Becky Bowers reports that the hip is pretty much all gone 0/10, the low back is 0.50/10, the Rt shoulder is gone and the Lt shoulder is sore now in shoulder blade area. She is going out of town today for a long weekend.  Bought a home TENs unit and wishes to learn about this.    Currently in Pain? Yes   Pain Score 2    Pain Location Shoulder   Pain Orientation Left   Pain Descriptors / Indicators Aching   Pain Type Acute pain   Pain Frequency Intermittent   Aggravating Factors  reaching overhead and transitionint to lying down   Pain Relieving Factors TENs                         [facility] Adult PT Treatment/Exercise - 12/31/15 0001    Self-Care   Self-Care --  home TENs   Lumbar Exercises: Aerobic   Stationary Bike Nustep arms and legs: L4 x 5'   Lumbar Exercises: Prone   Other Prone Lumbar Exercises 2x10 reps OH pull, horizontal abduct, SASH, ER , red band for upper back.    Modalities   Modalities Electrical Stimulation;Moist Heat   Moist Heat Therapy   Number Minutes Moist Heat 15 Minutes   Moist Heat Location --   Designer, jewellery Location Lt thoracic /scapular area                PT Education - 12/31/15 [phone]    Education provided Yes   Education Details HEP and home TENs unit use   Person(s) Educated Patient   Methods Explanation;Demonstration;Handout   Comprehension Returned demonstration;Verbalized understanding             PT Long Term Goals - 12/31/15 0809    PT LONG TERM GOAL #1   Title i with advanced HEP to include stretching ( 12/31/15)   Status New   PT LONG TERM GOAL #2   Title report =/> 75% reduction of pain with transitioning sit to stand ( 12/31/15)    Status Achieved   PT LONG TERM GOAL #3   Title improve FOTO =/< 20% limited ( 12/31/15)    Status On-going   PT LONG TERM GOAL #4   Title demo strong contraction of TA muscle and Rt hip extension 5/5 ( 12/31/15)    Status Partially Met               Plan -  12/31/15 1253    Clinical Impression Statement Becky Bowers states her hip and low back pain are pretty much resolved.  She is worried as she has a 7 hr car ride this weekend to her daughters graduation.  Making great progress to her goals.    Rehab Potential Good   PT Frequency 2x / week   PT Duration 4 weeks   PT Treatment/Interventions Ultrasound;Neuromuscular re-education;Patient/family education;Cryotherapy;Electrical Stimulation;Moist Heat;Therapeutic exercise;Manual techniques;Taping;Iontophoresis 68m/ml Dexamethasone;Dry needling   PT Next Visit Plan reassess       Patient will benefit from skilled therapeutic intervention in order to improve the following deficits and impairments:  Decreased strength, Pain, Increased muscle spasms, Hypomobility  Visit Diagnosis: Muscle weakness (generalized)  Stiffness of right hip, not elsewhere classified  Pain in right hip     Problem List Patient Active Problem List   Diagnosis Date Noted  . Bursitis of right hip 12/08/2015  . Eustachian tube dysfunction  02/12/2015  . HEADACHE 08/12/2010  . LUMBAGO 01/07/2008    SJeral PinchPT 12/31/2015, 12:55 PM  [facility] [address]Phone: [phone]  Fax:  [phone] Name: PKARELYN BRISBYMRN: [id number] of Birth: 503-28-1957

## 2016-01-06 ENCOUNTER — Encounter: Payer: Self-pay | Admitting: Physical Therapy

## 2016-01-06 ENCOUNTER — Encounter: Payer: Self-pay | Admitting: Family Medicine

## 2016-01-06 ENCOUNTER — Ambulatory Visit (INDEPENDENT_AMBULATORY_CARE_PROVIDER_SITE_OTHER): Payer: BC Managed Care – PPO | Admitting: Physical Therapy

## 2016-01-06 ENCOUNTER — Ambulatory Visit (INDEPENDENT_AMBULATORY_CARE_PROVIDER_SITE_OTHER): Payer: BC Managed Care – PPO | Admitting: Family Medicine

## 2016-01-06 VITALS — BP 111/70 | HR 54 | Wt 127.0 lb

## 2016-01-06 DIAGNOSIS — M7071 Other bursitis of hip, right hip: Secondary | ICD-10-CM | POA: Diagnosis not present

## 2016-01-06 DIAGNOSIS — M6281 Muscle weakness (generalized): Secondary | ICD-10-CM | POA: Diagnosis not present

## 2016-01-06 DIAGNOSIS — M25551 Pain in right hip: Secondary | ICD-10-CM | POA: Diagnosis not present

## 2016-01-06 DIAGNOSIS — M25651 Stiffness of right hip, not elsewhere classified: Secondary | ICD-10-CM

## 2016-01-06 NOTE — Progress Notes (Signed)
       Becky Bowers is a 60 y.o. female [facility] today for follow-up trochanteric bursitis. Patient was recently seen on May 2 for right lateral hip pain thought to be due to trochanteric bursitis versus insertional tendinitis. She's had multiple visits with physical therapy and is feeling much better.    No past medical history on file. Past Surgical History  Procedure Laterality Date  . Appendectomy  1968  . Tonsillectomy  1980  . Dilation and curettage of uterus  1990   Social History  Substance Use Topics  . Smoking status: Never Smoker   . Smokeless tobacco: Never Used  . Alcohol Use: 1.2 - 2.4 oz/week    2-4 Glasses of wine per week   family history includes Alzheimer's disease in her paternal grandmother; Colon cancer in her paternal grandmother; Diabetes in her father; Hypertension in her father; Squamous cell carcinoma in her father.  ROS as above:  Medications: Current Outpatient Prescriptions  Medication Sig Dispense Refill  . azelastine (ASTELIN) 0.1 % nasal spray Place 1 spray into both nostrils 2 (two) times daily. Use in each nostril as directed 30 mL 12  . Calcium Carbonate-Vitamin D (CALCIUM + D) 600-200 MG-UNIT TABS Take 1 tablet by mouth 2 (two) times daily.     . Estradiol 10 MCG TABS vaginal tablet 1 tablet qhs for a week and then 2 times per week 30 tablet 12  . fluticasone (FLONASE) 50 MCG/ACT nasal spray Place 1-2 sprays into both nostrils daily. 16 g 11  . ibandronate (BONIVA) 150 MG tablet Take 1 tablet (150 mg total) by mouth every 30 (thirty) days. Take in the morning with a full glass of water, on an empty stomach, and do not take anything else by mouth or lie down for the next 30 min. 3 tablet 6  . Omega-3 Fatty Acids (FISH OIL) 1000 MG CAPS Take 1 capsule by mouth daily.      . Pediatric Multivitamins-Iron  (FLINTSTONES PLUS IRON PO) Take by mouth.    . valACYclovir (VALTREX) 1000 MG tablet Take 1 tablet (1,000 mg total) by mouth as needed. 7 tablet 3   No current facility-administered medications for this visit.   No Known Allergies   Exam:  BP 111/70 mmHg  Pulse 54  Wt 127 lb (57.607 kg) Gen: Well NAD MSK: Minimally tender right greater trochanter. Normal hip motion. Hip abduction strength is 4+/5 bilaterally. Normal gait.  No results found for this or any previous visit (from the past 24 hour(s)). No results found.    Assessment and Plan: 60 y.o. female with nearly resolved greater trochanteric bursitis of the right hip. Continue home physical therapy exercises. Return as needed. I will be happy to reauthorize physical therapy as patient requests.  Discussed warning signs or symptoms. Please see discharge instructions. Patient expresses understanding.

## 2016-01-06 NOTE — Patient Instructions (Signed)
Thank you for coming in today. Return as needed.  Request a PT referral if the symptoms return.

## 2016-01-06 NOTE — Therapy (Signed)
[place], [place], [address] Phone: [phone]   Fax:  [phone]  Physical Therapy Treatment  Patient Details  Name: Becky Bowers MRN: [id number] Date of Birth: 1956-06-12 Referring Provider: Dr Georgina Snell  Encounter Date: 01/06/2016      PT End of Session - 01/06/16 0801    Visit Number 8   Number of Visits 8   Date for PT Re-Evaluation 01/07/16   PT Start Time 0803   PT Stop Time 7989   PT Time Calculation (min) 54 min   Activity Tolerance Patient tolerated treatment well      History reviewed. No pertinent past medical history.  Past Surgical History  Procedure Laterality Date  . Appendectomy  1968  . Tonsillectomy  1980  . Dilation and curettage of uterus  1990    There were no vitals filed for this visit.      Subjective Assessment - 01/06/16 0805    Subjective Doing well, no problems with what she came in with initially.  Did stiffen up with the long drive over the weekend. Once she starts moving she feels much better.    Currently in Pain? Yes   Pain Score 2    Pain Location Thoracic   Pain Orientation Left   Pain Descriptors / Indicators Aching   Pain Type Acute pain   Aggravating Factors  reaching over head.   Pain Relieving Factors TENs and exercise            [facility] PT Assessment - 01/06/16 0001    Assessment   Medical Diagnosis Rt hip bursitis   Referring Provider Dr Georgina Snell   Onset Date/Surgical Date 11/26/15   Next MD Visit 01/06/16   Observation/Other Assessments   Focus on Therapeutic Outcomes (FOTO)  22% limited                     [facility] Adult PT Treatment/Exercise - 01/06/16 0001    Lumbar Exercises: Stretches   Passive Hamstring Stretch 2 reps;30 seconds  each leg with strap   Quadruped Mid Back Stretch 5 reps   ITB Stretch 2 reps;30 seconds  cross body with strap   Lumbar Exercises: Aerobic   Stationary Bike Nustep arms and legs: L5  x 5'   Lumbar Exercises: Supine   Bridge 10 reps;Non-compliant  with 3 reps shoulder horiz abd, green band   Lumbar Exercises: Sidelying   Other Sidelying Lumbar Exercises upper back rotations with green band. 2x10   Lumbar Exercises: Prone   Other Prone Lumbar Exercises upper body lifts with arms reaching overhead, 10 reps lift, each time 3 arms   Modalities   Modalities Electrical Stimulation;Moist Heat   Moist Heat Therapy   Number Minutes Moist Heat 15 Minutes   Moist Heat Location --  thoracic   Electrical Stimulation   Electrical Stimulation Location Lt thoracic /scapular area   Printmaker Action IFC    Electrical Stimulation Parameters to tolerance   Electrical Stimulation Goals Pain                PT Education - 01/06/16 0827    Education provided Yes   Education Details HEP progression using physioball   Person(s) Educated Patient   Methods Explanation;Demonstration   Comprehension Returned demonstration;Verbalized understanding             PT Long Term Goals - 01/06/16 0800    PT LONG TERM GOAL #1   Title i  with advanced HEP to include stretching ( 12/31/15)   Status Achieved   PT LONG TERM GOAL #2   Title report =/> 75% reduction of pain with transitioning sit to stand ( 12/31/15)    Status Achieved   PT LONG TERM GOAL #3   Title improve FOTO =/< 20% limited ( 12/31/15)    Status Not Met  scored 22%, 31% at initial eval.    PT LONG TERM GOAL #4   Title demo strong contraction of TA muscle and Rt hip extension 5/5 ( 12/31/15)    Status Achieved               Plan - 01/06/16 0828    Clinical Impression Statement Becky Bowers continues to do very well, she is ready for discharge to HEP for her low back and hip. She has some discomfort in her upper back area however hopefully this will resolve with HEP    Rehab Potential Good   PT Next Visit Plan D/C to HEP    Consulted and Agree with Plan of Care Patient      Patient will benefit from  skilled therapeutic intervention in order to improve the following deficits and impairments:  Decreased strength, Pain, Increased muscle spasms, Hypomobility  Visit Diagnosis: Muscle weakness (generalized)  Stiffness of right hip, not elsewhere classified  Pain in right hip     Problem List Patient Active Problem List   Diagnosis Date Noted  . Bursitis of right hip 12/08/2015  . Eustachian tube dysfunction 02/12/2015  . HEADACHE 08/12/2010  . LUMBAGO 01/07/2008    Becky Bowers,Becky Bowers 01/06/2016, 8:41 AM  [facility] [place], [place], [address] Phone: [phone]   Fax:  [phone]  Name: Becky Bowers MRN: [id number] Date of Birth: 1955/10/20    PHYSICAL THERAPY DISCHARGE SUMMARY  Visits from Start of Care: 8  Current functional level related to goals / functional outcomes: See above   Remaining deficits: None form what she was referred to [place] for.  Currently has some upper back Rt thoracic pain.  She has been issued there ex to work on this.    Education / Equipment: HEP  Plan: Patient agrees to discharge.  Patient goals were partially met. Patient is being discharged due to being pleased with the current functional level.  ?????     Jeral Pinch, PT 01/06/2016 8:41 AM

## 2016-06-13 ENCOUNTER — Other Ambulatory Visit (HOSPITAL_COMMUNITY): Payer: Self-pay | Admitting: Obstetrics & Gynecology

## 2016-06-13 DIAGNOSIS — Z1231 Encounter for screening mammogram for malignant neoplasm of breast: Secondary | ICD-10-CM

## 2016-07-06 ENCOUNTER — Encounter: Payer: Self-pay | Admitting: Family Medicine

## 2016-07-06 ENCOUNTER — Ambulatory Visit (INDEPENDENT_AMBULATORY_CARE_PROVIDER_SITE_OTHER): Payer: BC Managed Care – PPO | Admitting: Family Medicine

## 2016-07-06 VITALS — BP 97/59 | HR 68 | Ht 63.0 in | Wt 126.0 lb

## 2016-07-06 DIAGNOSIS — Z0000 Encounter for general adult medical examination without abnormal findings: Secondary | ICD-10-CM

## 2016-07-06 DIAGNOSIS — J309 Allergic rhinitis, unspecified: Secondary | ICD-10-CM | POA: Diagnosis not present

## 2016-07-06 LAB — COMPLETE METABOLIC PANEL WITH GFR
ALBUMIN: 4.4 g/dL (ref 3.6–5.1)
ALK PHOS: 51 U/L (ref 33–130)
ALT: 9 U/L (ref 6–29)
AST: 28 U/L (ref 10–35)
BILIRUBIN TOTAL: 0.7 mg/dL (ref 0.2–1.2)
BUN: 12 mg/dL (ref 7–25)
CO2: 29 mmol/L (ref 20–31)
Calcium: 9.7 mg/dL (ref 8.6–10.4)
Chloride: 103 mmol/L (ref 98–110)
Creat: 0.8 mg/dL (ref 0.50–1.05)
GFR, EST NON AFRICAN AMERICAN: 81 mL/min (ref >=60)
GFR, Est African American: >89 mL/min (ref >=60)
GLUCOSE: 95 mg/dL (ref 65–99)
Potassium: 4.9 mmol/L (ref 3.5–5.3)
SODIUM: 141 mmol/L (ref 135–146)
TOTAL PROTEIN: 6.8 g/dL (ref 6.1–8.1)

## 2016-07-06 LAB — LIPID PANEL
Cholesterol: 179 mg/dL (ref <200)
HDL: 82 mg/dL (ref >50)
LDL CALC: 82 mg/dL (ref <100)
Total CHOL/HDL Ratio: 2.2 Ratio (ref <5.0)
Triglycerides: 75 mg/dL (ref <150)
VLDL: 15 mg/dL (ref <30)

## 2016-07-06 MED ORDER — AZELASTINE HCL 0.1 % NA SOLN: 1.0000 | Freq: Two times a day (BID) | NASAL | 12 refills | Status: DC

## 2016-07-06 MED ORDER — FLUTICASONE PROPIONATE 50 MCG/ACT NA SUSP: 1.0000 | Freq: Every day | NASAL | 12 refills | Status: DC

## 2016-07-06 NOTE — Patient Instructions (Signed)
Keep up a regular exercise program and make sure you are eating a healthy diet Try to eat 4 servings of dairy a day, or if you are lactose intolerant take a calcium with vitamin D daily.  Your vaccines are up to date.   

## 2016-07-06 NOTE — Progress Notes (Signed)
Subjective:     Becky Bowers is a 60 y.o. female and is here for a comprehensive physical exam. The patient reports no problems.  Social History   Social History  . Marital status: Married    Spouse name: Ramon Dredgedward.   . Number of children: 1  . Years of education: N/A   Occupational History  . Retired [employer] Teacher [employer]   Social History Main Topics  . Smoking status: Never Smoker  . Smokeless tobacco: Never Used  . Alcohol use 1.2 - 2.4 oz/week    2 - 4 Glasses of wine per week  . Drug use: No  . 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   Other Topics Concern  . Not on file   Social History Narrative   1 Caffeine drink per day. 5 days per week exercising. Retired.     Health Maintenance  Topic Date Due  . INFLUENZA VACCINE  03/08/2016  . MAMMOGRAM  07/07/2017  . PAP SMEAR  07/05/2018  . TETANUS/TDAP  01/11/2021  . COLONOSCOPY  08/15/2024  . Hepatitis C Screening  Completed  . HIV Screening  Completed    The following portions of the patient's history were reviewed and updated as appropriate: allergies, current medications, past family history, past medical history, past social history, past surgical history and problem list.  Review of Systems A comprehensive review of systems was negative.   Objective:    BP (!) 97/59   Pulse 68   Ht 5\' 3"  (1.6 m)   Wt 126 lb (57.2 kg)   SpO2 100%   BMI 22.32 kg/m  General appearance: alert, cooperative and appears stated age Head: Normocephalic, without obvious abnormality, atraumatic Eyes: conj clear, EOMi, PEERLA Ears: normal TM's and external ear canals both ears Nose: Nares normal. Septum midline. Mucosa normal. No drainage or sinus tenderness. Throat: lips, mucosa, and tongue normal; teeth and gums normal Neck: no adenopathy, no carotid bruit, no  JVD, supple, symmetrical, trachea midline and thyroid not enlarged, symmetric, no tenderness/mass/nodules Back: symmetric, no curvature. ROM normal. No CVA tenderness. Lungs: clear to auscultation bilaterally Heart: regular rate and rhythm, S1, S2 normal, no murmur, click, rub or gallop Abdomen: soft, non-tender; bowel sounds normal; no masses,  no organomegaly Extremities: extremities normal, atraumatic, no cyanosis or edema Pulses: 2+ and symmetric Skin: Skin color, texture, turgor normal. No rashes or lesions Lymph nodes: Cervical, supraclavicular, and axillary nodes normal. Neurologic: Alert and oriented X 3, normal strength and tone. Normal symmetric reflexes. Normal coordination and gait    Assessment:    Healthy female exam.      Plan:     See After Visit Summary for Counseling Recommendations   Keep up a regular exercise program and make sure you are eating a healthy diet Try to eat 4 servings of dairy a day, or if you are lactose intolerant take a calcium with vitamin D daily.  Your vaccines are up to date.   Seborrheic keratosis-likely benign. Certainly we could remove with cryotherapy or a shave biopsy. Will come back and schedule appointment.

## 2016-07-07 ENCOUNTER — Encounter: Payer: Self-pay | Admitting: Obstetrics & Gynecology

## 2016-07-07 ENCOUNTER — Ambulatory Visit (INDEPENDENT_AMBULATORY_CARE_PROVIDER_SITE_OTHER): Payer: BC Managed Care – PPO | Admitting: Obstetrics & Gynecology

## 2016-07-07 ENCOUNTER — Other Ambulatory Visit: Payer: Self-pay | Admitting: Obstetrics & Gynecology

## 2016-07-07 VITALS — BP 104/67 | HR 60 | Ht 63.0 in | Wt 128.0 lb

## 2016-07-07 DIAGNOSIS — Z124 Encounter for screening for malignant neoplasm of cervix: Secondary | ICD-10-CM | POA: Diagnosis not present

## 2016-07-07 DIAGNOSIS — Z1151 Encounter for screening for human papillomavirus (HPV): Secondary | ICD-10-CM

## 2016-07-07 DIAGNOSIS — Z01419 Encounter for gynecological examination (general) (routine) without abnormal findings: Secondary | ICD-10-CM

## 2016-07-07 MED ORDER — ESTRADIOL 10 MCG VA TABS: ORAL_TABLET | VAGINAL | 12 refills | Status: DC

## 2016-07-07 MED ORDER — IBANDRONATE SODIUM 150 MG PO TABS: 150.0000 mg | ORAL_TABLET | ORAL | 6 refills | Status: DC

## 2016-07-07 NOTE — Progress Notes (Signed)
Subjective:    Becky Bowers is a 60 y.o. MW female who presents for an annual exam. The patient has no complaints today. The patient is sexually active. GYN screening history: last pap: was normal. The patient wears seatbelts: yes. The patient participates in regular exercise: yes. Has the patient ever been transfused or tattooed?: no. The patient reports that there is not domestic violence in her life.   Menstrual History: OB History    Gravida Para Term Preterm AB Living   2 1     1 1    SAB TAB Ectopic Multiple Live Births   1              Menarche age: [age] No LMP recorded. Patient is postmenopausal.    The following portions of the patient's history were reviewed and updated as appropriate: allergies, current medications, past family history, past medical history, past social history, past surgical history and problem list.  Review of Systems Pertinent items are noted in HPI.   Retired Runner, broadcasting/film/videoteacher Denies dyspareunia FH- no breast/gyn cancer + colon cancer - pgm Mammogram next week Colonoscopy about 2 years ago   Objective:    BP 104/67   Pulse 60   Ht 5\' 3"  (1.6 m)   Wt 128 lb (58.1 kg)   BMI 22.67 kg/m   General Appearance:    Alert, cooperative, no distress, appears stated age  Head:    Normocephalic, without obvious abnormality, atraumatic  Eyes:    PERRL, conjunctiva/corneas clear, EOM's intact, fundi    benign, both eyes  Ears:    Normal TM's and external ear canals, both ears  Nose:   Nares normal, septum midline, mucosa normal, no drainage    or sinus tenderness  Throat:   Lips, mucosa, and tongue normal; teeth and gums normal  Neck:   Supple, symmetrical, trachea midline, no adenopathy;    thyroid:  no enlargement/tenderness/nodules; no carotid   bruit or JVD  Back:     Symmetric, no curvature, ROM normal, no CVA tenderness  Lungs:     Clear to auscultation bilaterally, respirations unlabored  Chest Wall:    No tenderness or deformity   Heart:    Regular  rate and rhythm, S1 and S2 normal, no murmur, rub   or gallop  Breast Exam:    No tenderness, masses, or nipple abnormality  Abdomen:     Soft, non-tender, bowel sounds active all four quadrants,    no masses, no organomegaly  Genitalia:    Normal female without lesion, discharge or tenderness, NSSA, NT, mobile, no adnexal masses     Extremities:   Extremities normal, atraumatic, no cyanosis or edema  Pulses:   2+ and symmetric all extremities  Skin:   Skin color, texture, turgor normal, no rashes or lesions  Lymph nodes:   Cervical, supraclavicular, and axillary nodes normal  Neurologic:   CNII-XII intact, normal strength, sensation and reflexes    throughout   .    Assessment:    Healthy female exam.    Plan:     Thin prep Pap smear. with cotesting (discussed ACOG recs) Continue healthy living  Refill generic vagifem and boniva

## 2016-07-08 LAB — VITAMIN D 25 HYDROXY (VIT D DEFICIENCY, FRACTURES): VIT D 25 HYDROXY: 61 ng/mL (ref 30–100)

## 2016-07-08 LAB — TSH: TSH: 0.64 m[IU]/L

## 2016-07-11 LAB — CYTOLOGY - PAP
Diagnosis: NEGATIVE
HPV: NOT DETECTED

## 2016-07-12 ENCOUNTER — Ambulatory Visit (INDEPENDENT_AMBULATORY_CARE_PROVIDER_SITE_OTHER): Payer: BC Managed Care – PPO

## 2016-07-12 DIAGNOSIS — Z1231 Encounter for screening mammogram for malignant neoplasm of breast: Secondary | ICD-10-CM | POA: Diagnosis not present

## 2016-08-18 IMAGING — MG MM DIGITAL SCREENING
5 series · 5 of 5 positions shown · non-contrast
Comparison: Previous exam(s).

CLINICAL DATA: Screening.

EXAM:
DIGITAL SCREENING BILATERAL MAMMOGRAM WITH CAD

[R CC]
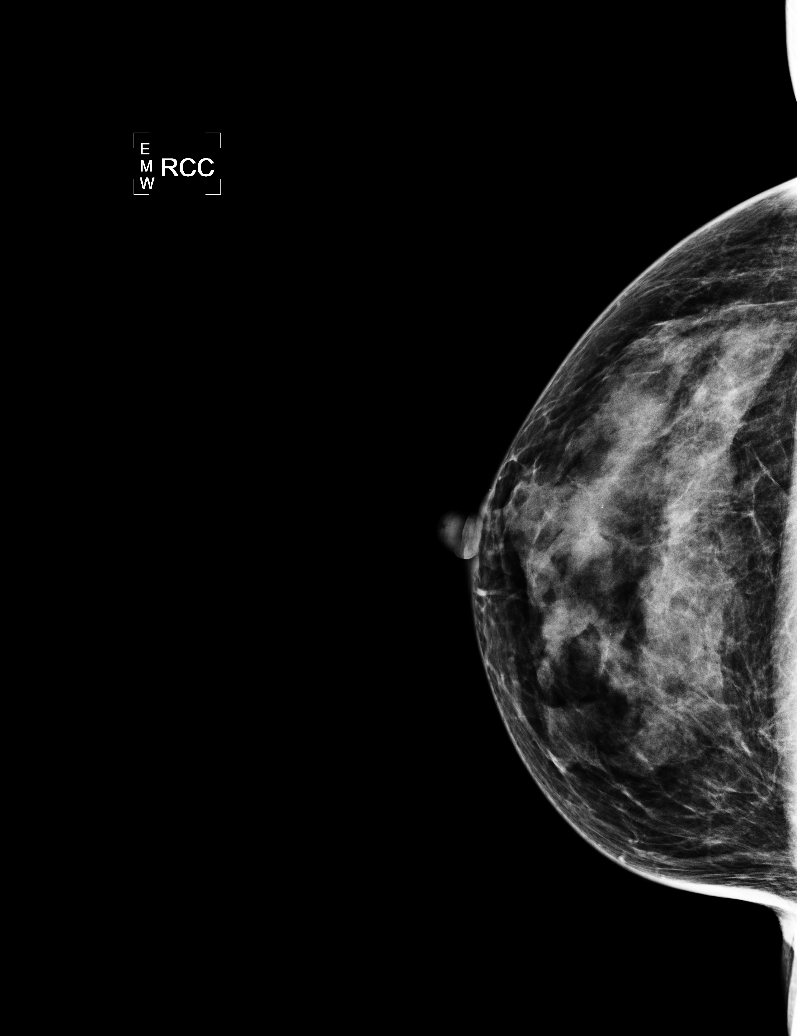

[L CC]
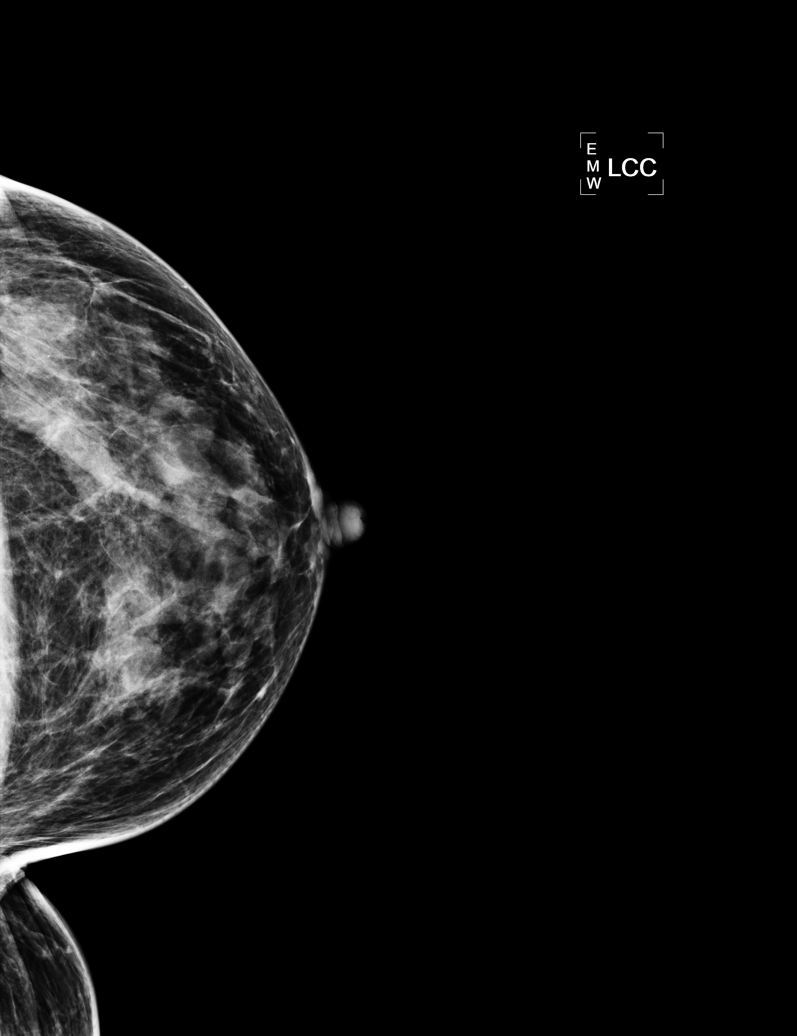

[L MLO]
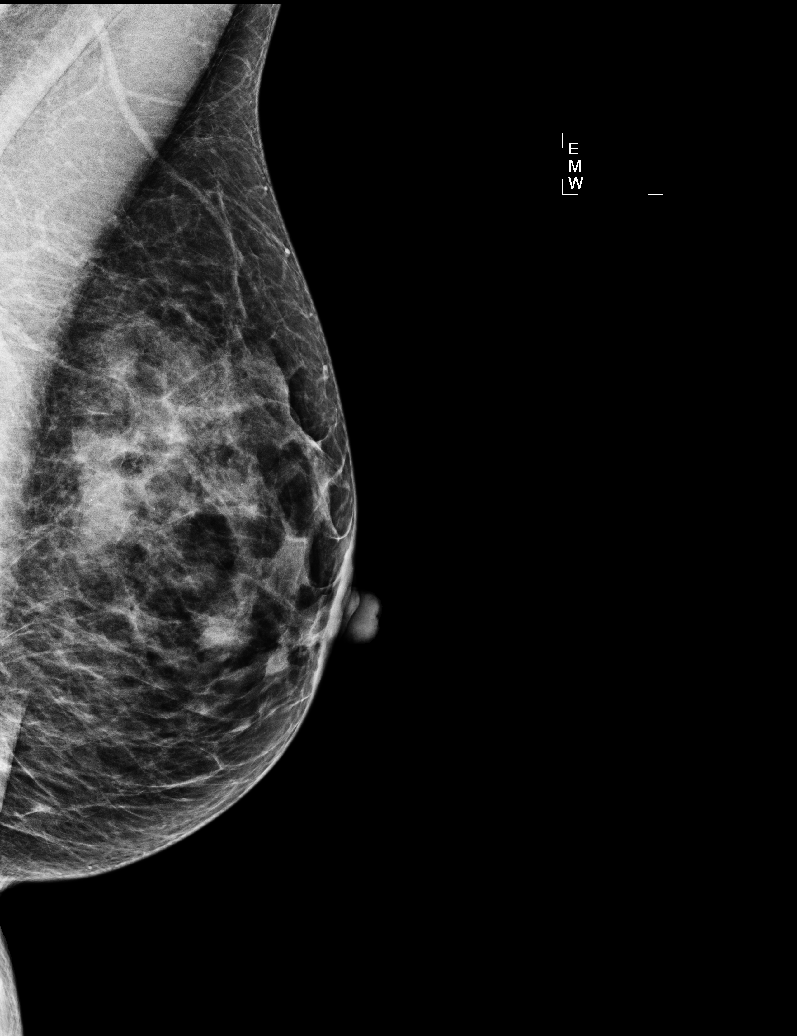

[R MLO]
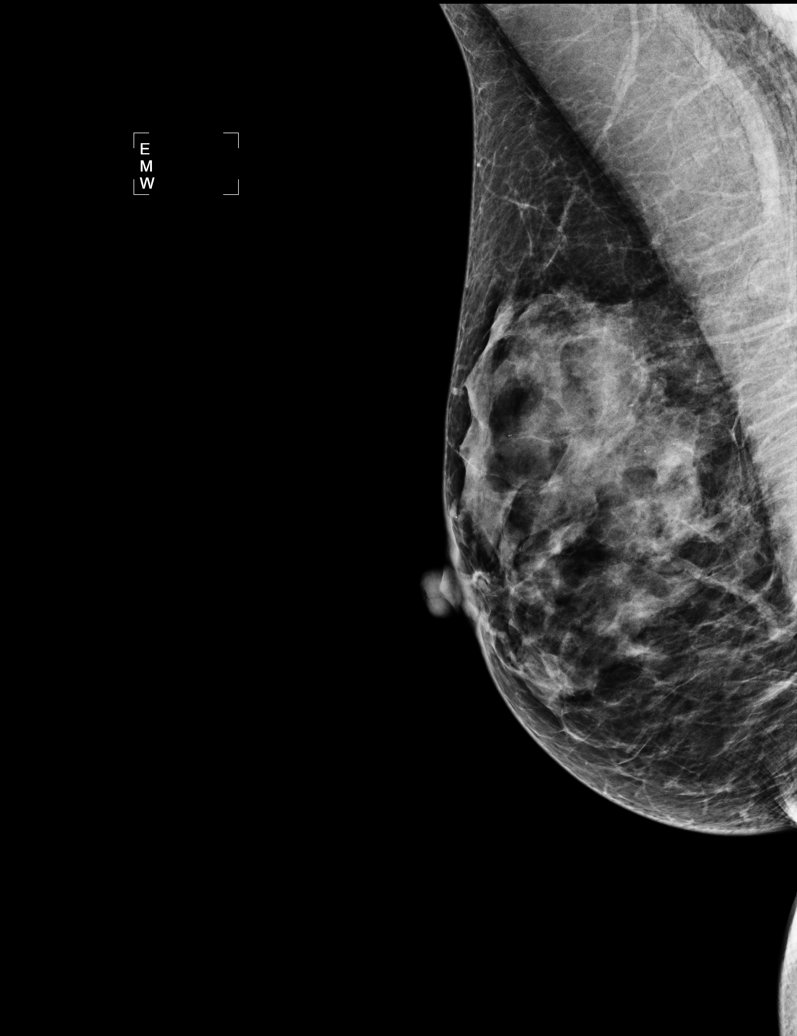

[L XCCL]
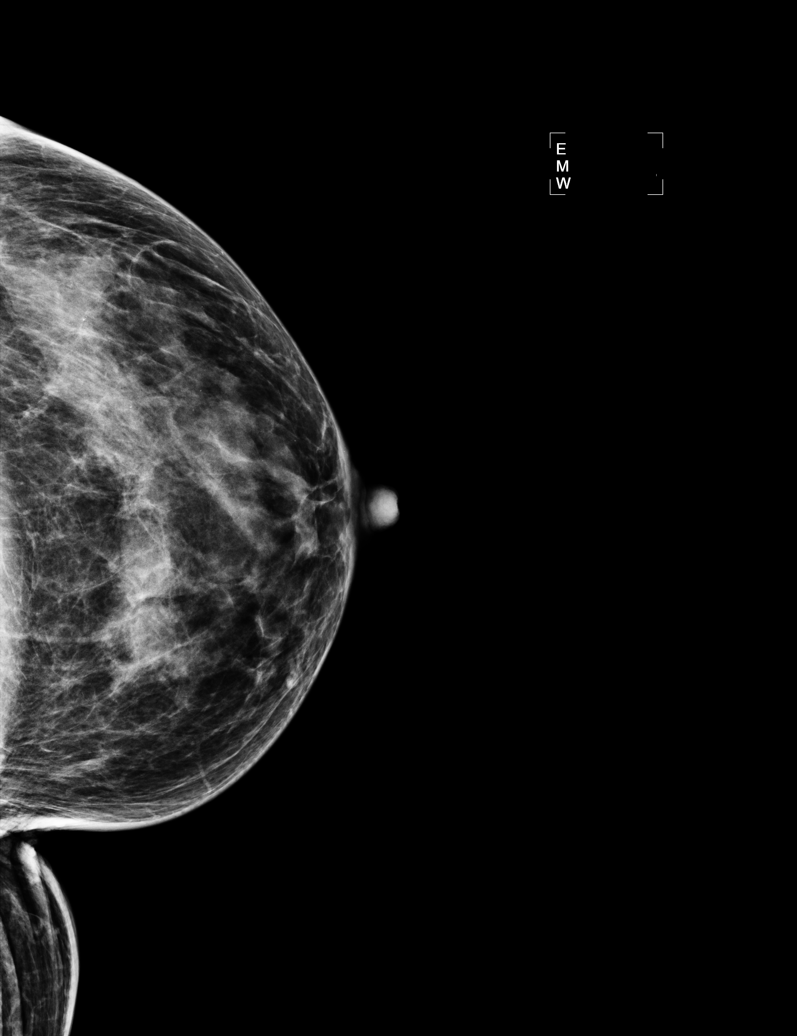

[5 of 5 positions shown; findings below may reference images not displayed]

ACR Breast Density Category c: The breast tissue is heterogeneously
dense, which may obscure small masses.
FINDINGS: There are no findings suspicious for malignancy. Images were
processed with CAD.
IMPRESSION: No mammographic evidence of malignancy. A result letter of this
screening mammogram will be mailed directly to the patient.

RECOMMENDATION:
Screening mammogram in one year. (Code:YJ-2-FEZ)

BI-RADS CATEGORY  1: Negative.

## 2016-09-29 ENCOUNTER — Other Ambulatory Visit: Payer: Self-pay | Admitting: *Deleted

## 2016-09-29 DIAGNOSIS — B009 Herpesviral infection, unspecified: Secondary | ICD-10-CM

## 2016-09-29 MED ORDER — VALACYCLOVIR HCL 1 G PO TABS
1000.0000 mg | ORAL_TABLET | ORAL | 3 refills | Status: DC | PRN
Start: 2016-09-29 — End: 2016-09-29

## 2016-09-29 MED ORDER — VALACYCLOVIR HCL 1 G PO TABS: 1000.0000 mg | ORAL_TABLET | ORAL | 3 refills | Status: DC | PRN

## 2016-09-29 NOTE — Telephone Encounter (Signed)
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  She was recently in for her annual so RF was granted and [facility] [place] per Dr Marice Potterove.

## 2016-10-24 ENCOUNTER — Ambulatory Visit (INDEPENDENT_AMBULATORY_CARE_PROVIDER_SITE_OTHER): Payer: BC Managed Care – PPO | Admitting: Family Medicine

## 2016-10-24 ENCOUNTER — Encounter: Payer: Self-pay | Admitting: Family Medicine

## 2016-10-24 VITALS — BP 115/68 | HR 56 | Ht 63.0 in | Wt 126.0 lb

## 2016-10-24 DIAGNOSIS — M1811 Unilateral primary osteoarthritis of first carpometacarpal joint, right hand: Secondary | ICD-10-CM | POA: Diagnosis not present

## 2016-10-24 DIAGNOSIS — J301 Allergic rhinitis due to pollen: Secondary | ICD-10-CM | POA: Diagnosis not present

## 2016-10-24 DIAGNOSIS — J309 Allergic rhinitis, unspecified: Secondary | ICD-10-CM

## 2016-10-24 MED ORDER — AZELASTINE HCL 0.1 % NA SOLN: 1.0000 | Freq: Two times a day (BID) | NASAL | 12 refills | Status: DC

## 2016-10-24 MED ORDER — FLUTICASONE PROPIONATE 50 MCG/ACT NA SUSP: 1.0000 | Freq: Every day | NASAL | 12 refills | Status: DC

## 2016-10-24 NOTE — Patient Instructions (Signed)
Call if want to do the xray later on.

## 2016-10-24 NOTE — Progress Notes (Signed)
Subjective:    Patient ID: Becky RamusPamela C Bowers, female    DOB: 05/14/1956, 61 y.o.   MRN: [id number]  HPI 61 yo female - Right wrist and hand pain - R hand and wrist. she reports that this has gotten worse over the past mo. no injury or trauma, she states that the pain will go from her hand up to her forearm midway.She says it's definitely worse when she is more active. And then other days it doesn't bother her at all. She has not had any swelling in her hands. No family history of autoimmune disorder.  Allergic rhinitis-she does need a refill on her Astelin and her Flonase today. Spring allergies have started and she wants to keep on her regimen.   Review of Systems  BP 115/68   Pulse (!) 56   Ht 5\' 3"  (1.6 m)   Wt 126 lb (57.2 kg)   SpO2 100%   BMI 22.32 kg/m     No Known Allergies  No past medical history on file.  Past Surgical History:  Procedure Laterality Date  . APPENDECTOMY  1968  . DILATION AND CURETTAGE OF UTERUS  1990  . TONSILLECTOMY  1980    Social History   Social History  . Marital status: Married    Spouse name: Ramon Dredgedward.   . Number of children: 1  . Years of education: N/A   Occupational History  . Retired [employer] Teacher [employer]   Social History Main Topics  . Smoking status: Never Smoker  . Smokeless tobacco: Never Used  . Alcohol use 1.2 - 2.4 oz/week    2 - 4 Glasses of wine per week     Comment: Occ  . Drug use: No  . 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   Other Topics Concern  . Not on file   Social History Narrative   1 Caffeine drink per day. 5 days per week exercising. Retired.      Family History  Problem Relation Age of Onset  . Diabetes Father   . Hypertension Father   . Squamous cell carcinoma Father     skin   . Colon cancer Paternal Grandmother   .  Alzheimer's disease Paternal Grandmother     Outpatient Encounter Prescriptions as of 10/24/2016  Medication Sig  . azelastine (ASTELIN) 0.1 % nasal spray Place 1 spray into both nostrils 2 (two) times daily. Use in each nostril as directed  . Calcium Carbonate-Vitamin D (CALCIUM + D) 600-200 MG-UNIT TABS Take 1 tablet by mouth 2 (two) times daily.   . Estradiol 10 MCG TABS vaginal tablet 1 tablet qhs for a week and then 2 times per week  . fluticasone (FLONASE) 50 MCG/ACT nasal spray Place 1-2 sprays into both nostrils daily.  [name]. ibandronate (BONIVA) 150 MG tablet Take 1 tablet (150 mg total) by mouth every 30 (thirty) days. Take in the morning with a full glass of water, on an empty stomach, and do not take anything else by mouth or lie down for the next 30 min.  [name]. L-Lysine 1000 MG TABS Take 1 tablet by mouth daily.  . Multiple Vitamins-Minerals (ONE-A-DAY WOMENS 50+ ADVANTAGE PO) Take 1 capsule by mouth daily.  . Omega-3 Fatty Acids (FISH OIL) 1000 MG CAPS Take 1 capsule by mouth daily.    . Pediatric Multivitamins-Iron (FLINTSTONES PLUS  IRON PO) Take by mouth.  . valACYclovir (VALTREX) 1000 MG tablet Take 1 tablet (1,000 mg total) by mouth as needed.  . [DISCONTINUED] azelastine (ASTELIN) 0.1 % nasal spray Place 1 spray into both nostrils 2 (two) times daily. Use in each nostril as directed  . [DISCONTINUED] fluticasone (FLONASE) 50 MCG/ACT nasal spray Place 1-2 sprays into both nostrils daily.   No facility-administered encounter medications on file as of 10/24/2016.           Objective:   Physical Exam  Constitutional: She is oriented to person, place, and time. She appears well-developed and well-nourished.  HENT:  Head: Normocephalic and atraumatic.  Eyes: Conjunctivae and EOM are normal.  Cardiovascular: Normal rate.   Pulmonary/Chest: Effort normal.  Musculoskeletal:  Right wrist and hand with normal range of motion. She has no redness swelling or bogginess of the joints on  exam. She does have some tenderness at the base of the thumb close to the wrist. Strength is 5 out of 5 in all directions. No significant deformity of the joints.  Neurological: She is alert and oriented to person, place, and time.  Skin: Skin is dry. No pallor.  Psychiatric: She has a normal mood and affect. Her behavior is normal.  Vitals reviewed.      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 Discussed working on some hand stretches to strengthen with a stress ball. She can use Tylenol or Aleve as needed for pain control. And continues heat or ice for pain relief. There really is no other further or specific treatments. Certainly if she feels like it's getting worse or she is noticing actual swelling of the joint and can get an x-ray and possible lab work. Next  Allergic rhinitis-refilled medications today.

## 2017-05-30 ENCOUNTER — Other Ambulatory Visit (HOSPITAL_COMMUNITY): Payer: Self-pay | Admitting: Obstetrics & Gynecology

## 2017-05-30 DIAGNOSIS — Z1231 Encounter for screening mammogram for malignant neoplasm of breast: Secondary | ICD-10-CM

## 2017-07-12 ENCOUNTER — Encounter: Payer: Self-pay | Admitting: Obstetrics & Gynecology

## 2017-07-12 ENCOUNTER — Ambulatory Visit (INDEPENDENT_AMBULATORY_CARE_PROVIDER_SITE_OTHER): Payer: BC Managed Care – PPO | Admitting: Obstetrics & Gynecology

## 2017-07-12 VITALS — BP 107/71 | HR 58 | Ht 64.0 in | Wt 124.0 lb

## 2017-07-12 DIAGNOSIS — Z124 Encounter for screening for malignant neoplasm of cervix: Secondary | ICD-10-CM | POA: Diagnosis not present

## 2017-07-12 DIAGNOSIS — B009 Herpesviral infection, unspecified: Secondary | ICD-10-CM

## 2017-07-12 DIAGNOSIS — Z1151 Encounter for screening for human papillomavirus (HPV): Secondary | ICD-10-CM | POA: Diagnosis not present

## 2017-07-12 DIAGNOSIS — Z01419 Encounter for gynecological examination (general) (routine) without abnormal findings: Secondary | ICD-10-CM

## 2017-07-12 MED ORDER — IBANDRONATE SODIUM 150 MG PO TABS: 150.0000 mg | ORAL_TABLET | ORAL | 6 refills | Status: DC

## 2017-07-12 MED ORDER — ESTRADIOL 10 MCG VA TABS: ORAL_TABLET | VAGINAL | 12 refills | Status: DC

## 2017-07-12 MED ORDER — PRAMOXINE HCL 1 % RE FOAM: 1.0000 "application " | Freq: Three times a day (TID) | RECTAL | 0 refills | Status: DC | PRN

## 2017-07-12 MED ORDER — VALACYCLOVIR HCL 1 G PO TABS: 1000.0000 mg | ORAL_TABLET | ORAL | 3 refills | Status: DC | PRN

## 2017-07-12 NOTE — Progress Notes (Signed)
Subjective:    Becky Bowers is a 61 y.o. married White P1 (60 yo [place], [place]  female who presents for an annual exam. The patient has no complaints today. The patient is sexually active. GYN screening history: last pap: was normal. The patient wears seatbelts: yes. The patient participates in regular exercise: yes. Has the patient ever been transfused or tattooed?: no. The patient reports that there is not domestic violence in her life.   Menstrual History: OB History    Gravida Para Term Preterm AB Living   2 1     1 1    SAB TAB Ectopic Multiple Live Births   1              Menarche age: [age] No LMP recorded. Patient is postmenopausal.    The following portions of the patient's history were reviewed and updated as appropriate: allergies, current medications, past family history, past medical history, past social history, past surgical history and problem list.  Review of Systems Pertinent items are noted in HPI.   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, broadcasting/film/videoteacher + colon cancer in paternal GM No gyn or breast cancer Mammogram tomorrow Colonoscopy UTD   Objective:    BP 107/71   Pulse (!) 58   Ht 5\' 4"  (1.626 m)   Wt 124 lb (56.2 kg)   BMI 21.28 kg/m   General Appearance:    Alert, cooperative, no distress, appears stated age  Head:    Normocephalic, without obvious abnormality, atraumatic  Eyes:    PERRL, conjunctiva/corneas clear, EOM's intact, fundi    benign, both eyes  Ears:    Normal TM's and external ear canals, both ears  Nose:   Nares normal, septum midline, mucosa normal, no drainage    or sinus tenderness  Throat:   Lips, mucosa, and tongue normal; teeth and gums normal  Neck:   Supple, symmetrical, trachea midline, no adenopathy;    thyroid:  no enlargement/tenderness/nodules; no carotid   bruit or JVD  Back:     Symmetric, no curvature, ROM normal, no CVA tenderness  Lungs:     Clear to auscultation bilaterally, respirations unlabored  Chest Wall:     No tenderness or deformity   Heart:    Regular rate and rhythm, S1 and S2 normal, no murmur, rub   or gallop  Breast Exam:    No tenderness, masses, or nipple abnormality  Abdomen:     Soft, non-tender, bowel sounds active all four quadrants,    no masses, no organomegaly  Genitalia:    Normal female without lesion, discharge or tenderness, normal size and shape, non-tender, normal adnexal exam  Thrombosed hemorrhoid noted.     Extremities:   Extremities normal, atraumatic, no cyanosis or edema  Pulses:   2+ and symmetric all extremities  Skin:   Skin color, texture, turgor normal, no rashes or lesions  Lymph nodes:   Cervical, supraclavicular, and axillary nodes normal  Neurologic:   CNII-XII intact, normal strength, sensation and reflexes    throughout  .    Assessment:    Healthy female exam.   hemorrhoid   Plan:     Thin prep Pap smear. with cotesting Proctofoam prescribed, gen surg as necessary

## 2017-07-13 ENCOUNTER — Encounter: Payer: Self-pay | Admitting: Family Medicine

## 2017-07-13 ENCOUNTER — Ambulatory Visit (INDEPENDENT_AMBULATORY_CARE_PROVIDER_SITE_OTHER): Payer: BC Managed Care – PPO

## 2017-07-13 ENCOUNTER — Ambulatory Visit (INDEPENDENT_AMBULATORY_CARE_PROVIDER_SITE_OTHER): Payer: BC Managed Care – PPO | Admitting: Family Medicine

## 2017-07-13 ENCOUNTER — Other Ambulatory Visit (HOSPITAL_COMMUNITY): Payer: Self-pay | Admitting: Obstetrics & Gynecology

## 2017-07-13 VITALS — BP 117/58 | HR 50 | Ht 63.0 in | Wt 124.0 lb

## 2017-07-13 DIAGNOSIS — Z1231 Encounter for screening mammogram for malignant neoplasm of breast: Secondary | ICD-10-CM | POA: Diagnosis not present

## 2017-07-13 DIAGNOSIS — Z0000 Encounter for general adult medical examination without abnormal findings: Secondary | ICD-10-CM

## 2017-07-13 DIAGNOSIS — Z23 Encounter for immunization: Secondary | ICD-10-CM

## 2017-07-13 NOTE — Progress Notes (Signed)
Subjective:     Becky Bowers is a 61 y.o. female and is here for a comprehensive physical exam. The patient reports no problems.  He just had her GYN exam with her OB/GYN yesterday.  Had her mammogram completed yesterday.  Social History   Socioeconomic History  . Marital status: Married    Spouse name: Ramon Dredgedward.   . Number of children: 1  . Years of education: Not on file  . Highest education level: Not on file  Social Needs  . Financial resource strain: Not on file  . Food insecurity - worry: Not on file  . Food insecurity - inability: Not on file  . Transportation needs - medical: Not on file  . Transportation needs - non-medical: Not on file  Occupational History  . Occupation: Retired Designer, television/film setBand Teacher    Employer: [employer]  Tobacco Use  . Smoking status: Never Smoker  . Smokeless tobacco: Never Used  Substance and Sexual Activity  . Alcohol use: Yes    Alcohol/week: 1.2 - 2.4 oz    Types: 2 - 4 Glasses of wine per week    Comment: Occ  . Drug use: No  . 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  Other Topics Concern  . Not on file  Social History Narrative   1 Caffeine drink per day. 5 days per week exercising. Retired.     Health Maintenance  Topic Date Due  . MAMMOGRAM  07/12/2018  . PAP SMEAR  07/08/2019  . TETANUS/TDAP  02/05/2021  . COLONOSCOPY  08/15/2024  . INFLUENZA VACCINE  Completed  . Hepatitis C Screening  Completed  . HIV Screening  Completed    The following portions of the patient's history were reviewed and updated as appropriate: allergies, current medications, past family history, past medical history, past social history, past surgical history and problem list.  Review of Systems   A comprehensive review of systems was negative.   Objective:    BP (!) 117/58   Pulse (!) 50    Ht 5\' 3"  (1.6 m)   Wt 124 lb (56.2 kg)   SpO2 100%   BMI 21.97 kg/m  General appearance: alert, cooperative and appears stated age Head: Normocephalic, without obvious abnormality, atraumatic Eyes: conj claer, EOMi, PEERLA Ears: normal TM's and external ear canals both ears Nose: Nares normal. Septum midline. Mucosa normal. No drainage or sinus tenderness. Throat: lips, mucosa, and tongue normal; teeth and gums normal Neck: no adenopathy, no carotid bruit, no JVD, supple, symmetrical, trachea midline and thyroid not enlarged, symmetric, no tenderness/mass/nodules Back: symmetric, no curvature. ROM normal. No CVA tenderness. Lungs: clear to auscultation bilaterally Heart: regular rate and rhythm, S1, S2 normal, no murmur, click, rub or gallop Abdomen: soft, non-tender; bowel sounds normal; no masses,  no organomegaly Extremities: extremities normal, atraumatic, no cyanosis or edema Pulses: 2+ and symmetric Skin: Skin color, texture, turgor normal. No rashes or lesions Lymph nodes: Cervical adenopathy: nl and Axillary adenopathy: nl Neurologic: Alert and oriented X 3, normal strength and tone. Normal symmetric reflexes. Normal coordination and gait    Assessment:    Healthy female exam.      Plan:     See After Visit Summary for Counseling Recommendations   Keep up a regular exercise program and make sure you are eating a healthy diet Try to eat 4 servings of  dairy a day, or if you are lactose intolerant take a calcium with vitamin D daily.  Your vaccines are up to date.

## 2017-07-13 NOTE — Addendum Note (Signed)
Addended by: Deno EtienneBARKLEY, Ocia Simek L on: 07/13/2017 11:36 AM   Modules accepted: Orders

## 2017-07-13 NOTE — Patient Instructions (Addendum)

## 2017-07-14 LAB — COMPLETE METABOLIC PANEL WITH GFR
AG Ratio: 1.8 (calc) (ref 1.0–2.5)
ALBUMIN MSPROF: 4.2 g/dL (ref 3.6–5.1)
ALKALINE PHOSPHATASE (APISO): 52 U/L (ref 33–130)
ALT: 8 U/L (ref 6–29)
AST: 24 U/L (ref 10–35)
BILIRUBIN TOTAL: 0.5 mg/dL (ref 0.2–1.2)
BUN: 14 mg/dL (ref 7–25)
CHLORIDE: 103 mmol/L (ref 98–110)
CO2: 27 mmol/L (ref 20–32)
Calcium: 9.3 mg/dL (ref 8.6–10.4)
Creat: 0.77 mg/dL (ref 0.50–0.99)
GFR, Est African American: 97 mL/min/{1.73_m2} (ref >=60)
GFR, Est Non African American: 84 mL/min/{1.73_m2} (ref >=60)
GLUCOSE: 114 mg/dL — AB (ref 65–99)
Globulin: 2.3 g/dL (calc) (ref 1.9–3.7)
Potassium: 4.5 mmol/L (ref 3.5–5.3)
Sodium: 139 mmol/L (ref 135–146)
Total Protein: 6.5 g/dL (ref 6.1–8.1)

## 2017-07-14 LAB — LIPID PANEL
CHOLESTEROL: 177 mg/dL (ref <200)
HDL: 82 mg/dL (ref >50)
LDL CHOLESTEROL (CALC): 78 mg/dL
Non-HDL Cholesterol (Calc): 95 mg/dL (calc) (ref <130)
Total CHOL/HDL Ratio: 2.2 (calc) (ref <5.0)
Triglycerides: 83 mg/dL (ref <150)

## 2017-07-14 LAB — CYTOLOGY - PAP
Diagnosis: NEGATIVE
HPV: NOT DETECTED

## 2017-07-14 LAB — CBC
HEMATOCRIT: 39.1 % (ref 35.0–45.0)
HEMOGLOBIN: 13.1 g/dL (ref 11.7–15.5)
MCH: 29.8 pg (ref 27.0–33.0)
MCHC: 33.5 g/dL (ref 32.0–36.0)
MCV: 89.1 fL (ref 80.0–100.0)
MPV: 11.7 fL (ref 7.5–12.5)
Platelets: 184 10*3/uL (ref 140–400)
RBC: 4.39 10*6/uL (ref 3.80–5.10)
RDW: 12.2 % (ref 11.0–15.0)
WBC: 7.2 10*3/uL (ref 3.8–10.8)

## 2017-07-14 LAB — TSH: TSH: 0.67 mIU/L (ref 0.40–4.50)

## 2017-08-21 ENCOUNTER — Ambulatory Visit (INDEPENDENT_AMBULATORY_CARE_PROVIDER_SITE_OTHER): Payer: BC Managed Care – PPO | Admitting: Family Medicine

## 2017-08-21 ENCOUNTER — Encounter: Payer: Self-pay | Admitting: Family Medicine

## 2017-08-21 ENCOUNTER — Ambulatory Visit (INDEPENDENT_AMBULATORY_CARE_PROVIDER_SITE_OTHER): Payer: BC Managed Care – PPO

## 2017-08-21 VITALS — BP 101/50 | HR 57 | Ht 63.0 in | Wt 128.0 lb

## 2017-08-21 DIAGNOSIS — M778 Other enthesopathies, not elsewhere classified: Secondary | ICD-10-CM

## 2017-08-21 DIAGNOSIS — M79641 Pain in right hand: Secondary | ICD-10-CM

## 2017-08-21 DIAGNOSIS — M2540 Effusion, unspecified joint: Secondary | ICD-10-CM

## 2017-08-21 DIAGNOSIS — M533 Sacrococcygeal disorders, not elsewhere classified: Secondary | ICD-10-CM | POA: Diagnosis not present

## 2017-08-21 DIAGNOSIS — M2550 Pain in unspecified joint: Secondary | ICD-10-CM

## 2017-08-21 DIAGNOSIS — M7702 Medial epicondylitis, left elbow: Secondary | ICD-10-CM

## 2017-08-21 DIAGNOSIS — M7072 Other bursitis of hip, left hip: Secondary | ICD-10-CM | POA: Diagnosis not present

## 2017-08-21 NOTE — Patient Instructions (Signed)
If not improving consider consultation with our sports medicine doc here.

## 2017-08-21 NOTE — Progress Notes (Signed)
Subjective:    Patient ID: Becky Bowers, female    DOB: [birth year], 62 y.o.   MRN: [id number]  HPI 62 year old female comes in today complaining of joint pain specifically in her wrist, elbow and tailbone.  10 months ago she cames in c/o right wrist and hand.  Now feels the pain is moving into her right finger.  She walks daily and does pilates. No family hx of autoimmune d/o. She reports she has noticed some swelling over these joint. Takes IBU only at night.       She is also have pain in her left elbow for about 1 month. She says it is mostly in the inner left elbow.   No trauma or injury.    She is having pain in her tailbone x 2-3 weeks. She denies any trauma or fall. Also have pain in the left buttock over the bone. She has been trying to keep pressure off the area.     Review of Systems  BP (!) 101/50   Pulse (!) 57   Ht 5\' 3"  (1.6 m)   Wt 128 lb (58.1 kg)   SpO2 100%   BMI 22.67 kg/m     No Known Allergies  History reviewed. No pertinent past medical history.  Past Surgical History:  Procedure Laterality Date  . APPENDECTOMY  1968  . DILATION AND CURETTAGE OF UTERUS  1990  . TONSILLECTOMY  1980    Social History   Socioeconomic History  . Marital status: Married    Spouse name: Ramon Dredge.   . Number of children: 1  . Years of education: Not on file  . Highest education level: Not on file  Social Needs  . Financial resource strain: Not on file  . Food insecurity - worry: Not on file  . Food insecurity - inability: Not on file  . Transportation needs - medical: Not on file  . Transportation needs - non-medical: Not on file  Occupational History  . Occupation: Retired Designer, television/film set: [employer]  Tobacco Use  . Smoking status: Never Smoker  . Smokeless tobacco: Never Used  Substance and Sexual Activity  . Alcohol use: Yes    Alcohol/week: 1.2 - 2.4 oz    Types: 2 - 4 Glasses of wine per week    Comment: Occ  . Drug use: No  . 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  Other Topics Concern  . Not on file  Social History Narrative   1 Caffeine drink per day. 5 days per week exercising. Retired.      Family History  Problem Relation Age of Onset  . Diabetes Father   . Hypertension Father   . Squamous cell carcinoma Father        skin   . Colon cancer Paternal Grandmother   . Alzheimer's disease Paternal Grandmother     Outpatient Encounter Medications as of 08/21/2017  Medication Sig  . azelastine (ASTELIN) 0.1 % nasal spray Place 1 spray into both nostrils 2 (two) times daily. Use in each nostril as directed  . Calcium Carbonate-Vitamin D (CALCIUM + D) 600-200 MG-UNIT TABS Take 1 tablet by mouth 2 (two) times daily.   . Estradiol 10 MCG TABS vaginal tablet 1 tablet per vagina daily  . fluticasone (FLONASE) 50 MCG/ACT nasal spray Place 1-2 sprays into both nostrils  daily.  . ibandronate (BONIVA) 150 MG tablet Take 1 tablet (150 mg total) by mouth every 30 (thirty) days. Take in the morning with a full glass of water, on an empty stomach, and do not take anything else by mouth or lie down for the next 30 min.  [name]. L-Lysine 1000 MG TABS Take 1 tablet by mouth daily.  . Multiple Vitamins-Minerals (ONE-A-DAY WOMENS 50+ ADVANTAGE PO) Take 1 capsule by mouth daily.  . Omega-3 Fatty Acids (FISH OIL) 1000 MG CAPS Take 1 capsule by mouth daily.    . Pediatric Multivitamins-Iron (FLINTSTONES PLUS IRON PO) Take by mouth.  . valACYclovir (VALTREX) 1000 MG tablet Take 1 tablet (1,000 mg total) by mouth as needed.   No facility-administered encounter medications on file as of 08/21/2017.          Objective:   Physical Exam  Musculoskeletal:  There is some swelling over the [facility] of the thumb and some swelling over the PIP on the index finger.  NROM. No redness.  Tender  over the medial left epicondyle. Pain with pronation against resistance.  Tender over the tailbone and over the left ischial bursa.  Left hip with normal flexion, extension. Normal internal and external rotation.         Assessment & Plan:  Right hand/thumb pain and pain over the PIP and MCP on the right hand - eval for RA vs OA.  Labs ordered and xray ordered.    Left medial epicondylitis -continue anti-inflammatory.  Trial of exercises to do on her own at home.  Plus trying to rest the joint as much as possible.  She may need to change certain positions while doing her Pilates exercises.  Tailbone pain- recommend donut cushion.    Left ischial bursitis - recommend  a donut cushion to help take pressure off the area. Consider injection if not improving.

## 2017-08-22 ENCOUNTER — Encounter: Payer: Self-pay | Admitting: Family Medicine

## 2017-08-22 LAB — CBC
HCT: 36.4 % (ref 35.0–45.0)
Hemoglobin: 12.4 g/dL (ref 11.7–15.5)
MCH: 30.2 pg (ref 27.0–33.0)
MCHC: 34.1 g/dL (ref 32.0–36.0)
MCV: 88.8 fL (ref 80.0–100.0)
MPV: 12.1 fL (ref 7.5–12.5)
Platelets: 246 Thousand/uL (ref 140–400)
RBC: 4.1 Million/uL (ref 3.80–5.10)
RDW: 12 % (ref 11.0–15.0)
WBC: 6.5 Thousand/uL (ref 3.8–10.8)

## 2017-08-22 LAB — CYCLIC CITRUL PEPTIDE ANTIBODY, IGG

## 2017-08-22 LAB — RHEUMATOID FACTOR: RHEUMATOID FACTOR: 16 [IU]/mL — AB (ref <14)

## 2017-08-22 LAB — ANA: Anti Nuclear Antibody(ANA): NEGATIVE

## 2017-08-22 LAB — TSH: TSH: 0.55 m[IU]/L (ref 0.40–4.50)

## 2017-08-25 ENCOUNTER — Other Ambulatory Visit: Payer: Self-pay | Admitting: Family Medicine

## 2017-08-25 MED ORDER — CELECOXIB 200 MG PO CAPS: 200.0000 mg | ORAL_CAPSULE | Freq: Two times a day (BID) | ORAL | 2 refills | Status: DC | PRN

## 2017-10-02 ENCOUNTER — Ambulatory Visit: Payer: BC Managed Care – PPO

## 2017-10-23 ENCOUNTER — Ambulatory Visit (INDEPENDENT_AMBULATORY_CARE_PROVIDER_SITE_OTHER): Payer: BC Managed Care – PPO | Admitting: Family Medicine

## 2017-10-23 VITALS — BP 125/62 | HR 51 | Temp 97.6°F | Resp 16 | Wt 128.3 lb

## 2017-10-23 DIAGNOSIS — Z23 Encounter for immunization: Secondary | ICD-10-CM | POA: Diagnosis not present

## 2017-10-23 NOTE — Progress Notes (Signed)
HPI: Patient is here for second Shingrix vaccination. Patient stated she did feel aching, sleepy and tired after receiving the first vaccination. Advised those are common side affects of the vaccine.   Assessment and Plan: Patient tolerated vaccine/injection - Left deltoid - well without complications. VIS sheet given to patient as well.

## 2017-10-23 NOTE — Progress Notes (Signed)
Agree with documentation as above.   Karion Cudd, MD  

## 2017-11-24 ENCOUNTER — Other Ambulatory Visit: Payer: Self-pay | Admitting: Family Medicine

## 2017-11-24 ENCOUNTER — Encounter: Payer: Self-pay | Admitting: Family Medicine

## 2017-11-24 DIAGNOSIS — J309 Allergic rhinitis, unspecified: Secondary | ICD-10-CM

## 2017-11-28 MED ORDER — AZELASTINE-FLUTICASONE 137-50 MCG/ACT NA SUSP: 1.0000 | Freq: Two times a day (BID) | NASAL | 99 refills | Status: DC

## 2017-12-01 ENCOUNTER — Other Ambulatory Visit: Payer: Self-pay | Admitting: Family Medicine

## 2018-03-19 ENCOUNTER — Ambulatory Visit (INDEPENDENT_AMBULATORY_CARE_PROVIDER_SITE_OTHER): Payer: BC Managed Care – PPO | Admitting: Sports Medicine

## 2018-03-19 ENCOUNTER — Encounter: Payer: Self-pay | Admitting: Sports Medicine

## 2018-03-19 ENCOUNTER — Ambulatory Visit (INDEPENDENT_AMBULATORY_CARE_PROVIDER_SITE_OTHER): Payer: BC Managed Care – PPO

## 2018-03-19 DIAGNOSIS — M79671 Pain in right foot: Secondary | ICD-10-CM | POA: Diagnosis not present

## 2018-03-19 DIAGNOSIS — R200 Anesthesia of skin: Secondary | ICD-10-CM

## 2018-03-19 MED ORDER — MELOXICAM 15 MG PO TABS: ORAL_TABLET | ORAL | 3 refills | Status: DC

## 2018-03-19 NOTE — Patient Instructions (Signed)
Morton Neuralgia Morton neuralgia is a type of foot pain in the area closest to your toes. This area is sometimes called the ball of your foot. Morton neuralgia occurs when a branch of a nerve in your foot (digital nerve) becomes compressed. When this happens over a long period of time, the nerve can thicken (neuroma) and cause pain. This usually occurs between the third and fourth toe. Morton neuralgia can come and go but may get worse over time. What are the causes? Your digital nerve can become compressed and stretched at a point where it passes under a thick band of tissue that connects your toes (intermetatarsal ligament). Morton neuralgia can be caused by mild repetitive damage in this area. This type of damage can result from:  Activities such as running or jumping.  Wearing shoes that are too tight.  What increases the risk? You may be at risk for Morton neuralgia if you:  Are female.  Wear high heels.  Wear shoes that are narrow or tight.  Participate in activities that stretch your toes. These include: ? Running. ? Ballet. ? Long-distance walking.  What are the signs or symptoms? The first symptom of Morton neuralgia is pain that spreads from the ball of your foot to your toes. It may feel like you are walking on a marble. Pain usually gets worse with walking and goes away at night. Other symptoms may include numbness and cramping of your toes. How is this diagnosed? Your health care provider will do a physical exam. When doing the exam, your health care provider may:  Squeeze your foot just behind your toe.  Ask you to move your toes to check for pain.  You may also have tests on your foot to confirm the diagnosis. These may include:  An X-ray.  An MRI.  How is this treated? Treatment for Morton neuralgia may be as simple as changing the kind of shoes you wear. Other treatments may include:  Wearing a supportive pad (orthosis) under the front of your foot. This  lifts your toe bones and takes pressure off the nerve.  Getting injections of numbing medicine and anti-inflammatory medicine (steroid) in the nerve.  Having surgery to remove part of the thickened nerve.  Follow these instructions at home:  Take medicine only as directed by your health care provider.  Wear soft-soled shoes with a wide toe area.  Stop activities that may be causing pain.  Elevate your foot when resting.  Massage your foot.  Apply ice to the injured area: ? Put ice in a plastic bag. ? Place a towel between your skin and the bag. ? Leave the ice on for 20 minutes, 2-3 times a day.  Keep all follow-up visits as directed by your health care provider. This is important. Contact a health care provider if:  Home care instructions are not helping you get better.  Your symptoms change or get worse. This information is not intended to replace advice given to you by your health care provider. Make sure you discuss any questions you have with your health care provider. Document Released: 10/31/2000 Document Revised: 12/31/2015 Document Reviewed: 09/25/2013 Elsevier Interactive Patient Education  2018 Elsevier Inc.  

## 2018-03-19 NOTE — Assessment & Plan Note (Addendum)
Suspect Morton's neuroma, between the third and fourth toes. Meloxicam, x-rays, return to see me for custom molded orthotics with a metatarsal pad. If this fails we will do an injection into the 3/4 intermetatarsal bursa/neuroma. If this fails we will then do an MRI with IV contrast.

## 2018-03-19 NOTE — Progress Notes (Signed)
Subjective:    I'm seeing this patient as a consultation for: Dr. Nani Gasseratherine Metheney  CC: Right foot numbness and tingling  HPI: For weeks this pleasant 62 year old female has been doing a lot of walking, she started to note increasing numbness between the third and fourth toes on the right foot.  Mild pain between the third and fourth metatarsal heads.  Symptoms are moderate, persistent, no radiation elsewhere, no trauma, nothing radiating from the back down the leg.  I reviewed the past medical history, family history, social history, surgical history, and allergies today and no changes were needed.  Please see the problem list section below in epic for further details.  Past Medical History: No past medical history on file. Past Surgical History: Past Surgical History:  Procedure Laterality Date  . APPENDECTOMY  1968  . DILATION AND CURETTAGE OF UTERUS  1990  . TONSILLECTOMY  1980   Social History: Social History   Socioeconomic History  . Marital status: Married    Spouse name: Ramon Dredgedward.   . Number of children: 1  . Years of education: Not on file  . Highest education level: Not on file  Occupational History  . Occupation: Retired Designer, television/film setBand Teacher    Employer: [employer]  Social Needs  . Financial resource strain: Not on file  . Food insecurity:    Worry: Not on file    Inability: Not on file  . Transportation needs:    Medical: Not on file    Non-medical: Not on file  Tobacco Use  . Smoking status: Never Smoker  . Smokeless tobacco: Never Used  Substance and Sexual Activity  . Alcohol use: Yes    Alcohol/week: 2.0 - 4.0 standard drinks    Types: 2 - 4 Glasses of wine per week    Comment: Occ  . Drug use: No  . 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  Lifestyle  . Physical activity:    Days per  week: Not on file    Minutes per session: Not on file  . Stress: Not on file  Relationships  . Social connections:    Talks on phone: Not on file    Gets together: Not on file    Attends religious service: Not on file    Active member of club or organization: Not on file    Attends meetings of clubs or organizations: Not on file    Relationship status: Not on file  Other Topics Concern  . Not on file  Social History Narrative   1 Caffeine drink per day. 5 days per week exercising. Retired.     Family History: Family History  Problem Relation Age of Onset  . Diabetes Father   . Hypertension Father   . Squamous cell carcinoma Father        skin   . Colon cancer Paternal Grandmother   . Alzheimer's disease Paternal Grandmother    Allergies: No Known Allergies Medications: See med rec.  Review of Systems: No headache, visual changes, nausea, vomiting, diarrhea, constipation, dizziness, abdominal pain, skin rash, fevers, chills, night sweats, weight loss, swollen lymph nodes, body aches, joint swelling, muscle aches, chest pain, shortness of breath, mood changes, visual or auditory hallucinations.   Objective:   General: Well Developed, well nourished, and in no acute distress.  Neuro:  Extra-ocular muscles intact, able to move all  4 extremities, sensation grossly intact.  Deep tendon reflexes tested were normal. Psych: Alert and oriented, mood congruent with affect. ENT:  Ears and nose appear unremarkable.  Hearing grossly normal. Neck: Unremarkable overall appearance, trachea midline.  No visible thyroid enlargement. Eyes: Conjunctivae and lids appear unremarkable.  Pupils equal and round. Skin: Warm and dry, no rashes noted.  Cardiovascular: Pulses palpable, no extremity edema. Right foot:  Foot inspection and palpation reveals breakdown of the transverse arch and a drop of MT heads. Abnormal callus is present between the second, third, and fourth metatarsal heads. Hammer  toes are present. Tender to palpation between the third and fourth metatarsal heads with reproduction of pain on squeezing these 2 metatarsal heads together.  Impression and Recommendations:   This case required medical decision making of moderate complexity.  Numbness of right third and fourth toes Suspect Morton's neuroma, between the third and fourth toes. Meloxicam, x-rays, return to see me for custom molded orthotics with a metatarsal pad. If this fails we will do an injection into the 3/4 intermetatarsal bursa/neuroma. If this fails we will then do an MRI with IV contrast.  ___________________________________________ Ihor Austinhomas J. Benjamin Stainhekkekandam, M.D., ABFM., CAQSM. Primary Care and Sports Medicine [facility]  Adjunct Instructor of Family Medicine  [employer]

## 2018-03-27 ENCOUNTER — Ambulatory Visit (INDEPENDENT_AMBULATORY_CARE_PROVIDER_SITE_OTHER): Payer: BC Managed Care – PPO | Admitting: Sports Medicine

## 2018-03-27 ENCOUNTER — Encounter: Payer: Self-pay | Admitting: Sports Medicine

## 2018-03-27 DIAGNOSIS — R200 Anesthesia of skin: Secondary | ICD-10-CM | POA: Diagnosis not present

## 2018-03-27 NOTE — Progress Notes (Signed)
    Patient was fitted for a : standard, cushioned, semi-rigid orthotic. The orthotic was heated and afterward the patient stood on the orthotic blank positioned on the orthotic stand. The patient was positioned in subtalar neutral position and 10 degrees of ankle dorsiflexion in a weight bearing stance. After completion of molding, a stable base was applied to the orthotic blank. The blank was ground to a stable position for weight bearing. Size: 8 Base: [facility] Additional Posting and Padding: Right-sided metatarsal pad, small between the third and fourth metatarsal heads The patient ambulated these, and they were very comfortable.  I spent 40 minutes with this patient, greater than 50% was face-to-face time counseling regarding the below diagnosis.  ___________________________________________ Ihor Austinhomas J. Benjamin Stainhekkekandam, M.D., ABFM., CAQSM. Primary Care and Sports Medicine [facility]  Adjunct Instructor of Family Medicine  [employer]

## 2018-03-27 NOTE — Assessment & Plan Note (Signed)
Suspect Morton's neuroma between third and fourth toes. Locks came has not made much of a difference. Custom orthotics today with a metatarsal pad between the right third and fourth metatarsal heads. If this fails we will do a 3/4 intermetatarsal bursa injection around the neuroma. If still no improvement we will do an MRI with IV contrast. Return in 1 month.

## 2018-04-24 ENCOUNTER — Encounter: Payer: Self-pay | Admitting: Sports Medicine

## 2018-04-24 ENCOUNTER — Ambulatory Visit: Payer: BC Managed Care – PPO | Admitting: Sports Medicine

## 2018-04-24 DIAGNOSIS — R200 Anesthesia of skin: Secondary | ICD-10-CM

## 2018-04-24 DIAGNOSIS — M1811 Unilateral primary osteoarthritis of first carpometacarpal joint, right hand: Secondary | ICD-10-CM

## 2018-04-24 NOTE — Assessment & Plan Note (Signed)
Suspect Morton's neuroma between third and fourth toes. Meloxicam did not help, custom orthotics with a metatarsal pad did not help. 3/4 intermetatarsal bursa injection today, return in a month, MR with IV contrast if no better.

## 2018-04-24 NOTE — Progress Notes (Signed)
Subjective:    CC: Right foot pain, hand pain  HPI: This is a pleasant 62 year old female, we have been treating her for 3/4 intermetatarsal bursitis versus Morton's neuroma.  At this point she is failed NSAIDs, activity modification, custom orthotics with metatarsal pad.  Symptoms are moderate, intermittent.  Right hand pain: Pain at the base of the thumb, failed NSAIDs, activity modification, localized without radiation, moderate, persistent.  Has never had injections.  I reviewed the past medical history, family history, social history, surgical history, and allergies today and no changes were needed.  Please see the problem list section below in epic for further details.  Past Medical History: No past medical history on file. Past Surgical History: Past Surgical History:  Procedure Laterality Date  . APPENDECTOMY  1968  . DILATION AND CURETTAGE OF UTERUS  1990  . TONSILLECTOMY  1980   Social History: Social History   Socioeconomic History  . Marital status: Married    Spouse name: Ramon Dredgedward.   . Number of children: 1  . Years of education: Not on file  . Highest education level: Not on file  Occupational History  . Occupation: Retired Designer, television/film setBand Teacher    Employer: [employer]  Social Needs  . Financial resource strain: Not on file  . Food insecurity:    Worry: Not on file    Inability: Not on file  . Transportation needs:    Medical: Not on file    Non-medical: Not on file  Tobacco Use  . Smoking status: Never Smoker  . Smokeless tobacco: Never Used  Substance and Sexual Activity  . Alcohol use: Yes    Alcohol/week: 2.0 - 4.0 standard drinks    Types: 2 - 4 Glasses of wine per week    Comment: Occ  . Drug use: No  . 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  Lifestyle  . Physical activity:    Days per  week: Not on file    Minutes per session: Not on file  . Stress: Not on file  Relationships  . Social connections:    Talks on phone: Not on file    Gets together: Not on file    Attends religious service: Not on file    Active member of club or organization: Not on file    Attends meetings of clubs or organizations: Not on file    Relationship status: Not on file  Other Topics Concern  . Not on file  Social History Narrative   1 Caffeine drink per day. 5 days per week exercising. Retired.     Family History: Family History  Problem Relation Age of Onset  . Diabetes Father   . Hypertension Father   . Squamous cell carcinoma Father        skin   . Colon cancer Paternal Grandmother   . Alzheimer's disease Paternal Grandmother    Allergies: No Known Allergies Medications: See med rec.  Review of Systems: No fevers, chills, night sweats, weight loss, chest pain, or shortness of breath.   Objective:    General: Well Developed, well nourished, and in no acute distress.  Neuro: Alert and oriented x3, extra-ocular muscles intact, sensation grossly intact.  HEENT: Normocephalic, atraumatic, pupils equal round reactive to light, neck supple, no masses, no lymphadenopathy, thyroid nonpalpable.  Skin: Warm and dry, no rashes. Cardiac: Regular rate and rhythm,  no murmurs rubs or gallops, no lower extremity edema.  Respiratory: Clear to auscultation bilaterally. Not using accessory muscles, speaking in full sentences. Right foot: No visible erythema or swelling. Range of motion is full in all directions. Strength is 5/5 in all directions. No hallux valgus. No pes cavus or pes planus. No abnormal callus noted. No pain over the navicular prominence, or base of fifth metatarsal. No tenderness to palpation of the calcaneal insertion of plantar fascia. No pain at the Achilles insertion. No pain over the calcaneal bursa. No pain of the retrocalcaneal bursa. No tenderness to palpation  over the tarsals, metatarsals, or phalanges. No hallux rigidus or limitus. No tenderness palpation over interphalangeal joints. Pain with compression of the third and fourth metatarsal heads with tenderness between the third and fourth metatarsals as well as the heads. Neurovascularly intact distally. Right hand: Tender to palpation at the thumb basal joint.  Procedure: Real-time Ultrasound Guided Injection of right third/fourth intermetatarsal bursa Device: GE Logiq E  Verbal informed consent obtained.  Time-out conducted.  Noted no overlying erythema, induration, or other signs of local infection.  Skin prepped in a sterile fashion.  Local anesthesia: Topical Ethyl chloride.  With sterile technique and under real time ultrasound guidance: 25-gauge needle advanced between the metatarsal heads, I then injected 1/2 cc lidocaine, 1 cc kenalog 40 Completed without difficulty  Pain immediately resolved suggesting accurate placement of the medication.  Advised to call if fevers/chills, erythema, induration, drainage, or persistent bleeding.  Images permanently stored and available for review in the ultrasound unit.  Impression: Technically successful ultrasound guided injection.  Procedure: Real-time Ultrasound Guided Injection of right trapeziometacarpal joint Device: GE Logiq E  Verbal informed consent obtained.  Time-out conducted.  Noted no overlying erythema, induration, or other signs of local infection.  Skin prepped in a sterile fashion.  Local anesthesia: Topical Ethyl chloride.  With sterile technique and under real time ultrasound guidance: 1/2 cc kenalog 40, 1/2 cc lidocaine injected easily. Completed without difficulty  Pain immediately resolved suggesting accurate placement of the medication.  Advised to call if fevers/chills, erythema, induration, drainage, or persistent bleeding.  Images permanently stored and available for review in the ultrasound unit.  Impression:  Technically successful ultrasound guided injection.  Impression and Recommendations:    Numbness of right third and fourth toes Suspect Morton's neuroma between third and fourth toes. Meloxicam did not help, custom orthotics with a metatarsal pad did not help. 3/4 intermetatarsal bursa injection today, return in a month, MR with IV contrast if no better.  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 ___________________________________________ Ihor Austin. Benjamin Stain, M.D., ABFM., CAQSM. Primary Care and Sports Medicine [facility]  Adjunct Instructor of Family Medicine  [employer]

## 2018-04-24 NOTE — Assessment & Plan Note (Signed)
Failed conservative measures including NSAIDs, first CMC injection today, return in 1 month.

## 2018-04-25 ENCOUNTER — Encounter: Payer: Self-pay | Admitting: Sports Medicine

## 2018-05-22 ENCOUNTER — Ambulatory Visit (INDEPENDENT_AMBULATORY_CARE_PROVIDER_SITE_OTHER): Payer: BC Managed Care – PPO | Admitting: Sports Medicine

## 2018-05-22 ENCOUNTER — Encounter: Payer: Self-pay | Admitting: Sports Medicine

## 2018-05-22 DIAGNOSIS — M1811 Unilateral primary osteoarthritis of first carpometacarpal joint, right hand: Secondary | ICD-10-CM | POA: Diagnosis not present

## 2018-05-22 DIAGNOSIS — R200 Anesthesia of skin: Secondary | ICD-10-CM

## 2018-05-22 NOTE — Assessment & Plan Note (Signed)
3/4 intermetatarsal bursitis, pain-free after the injection at the last visit with persistent numbness. I did explain to her that the numbness could last for several months. She will wear her orthotics as much as possible. I did place a metatarsal pad in her right shoe. Return to see me in a couple of months. She feels as though she can live with it now.

## 2018-05-22 NOTE — Progress Notes (Signed)
Subjective:    CC: Follow-up  HPI: Right trapeziometacarpal osteoarthritis: Now pain-free after injection of the last visit.  Right foot pain: With numbness between the third and fourth toes, initially suspected 3/4 intermetatarsal bursitis which was injected under ultrasound guidance.  Her pain is now gone, she still has a bit of numbness to the third and fourth toes.  She does wear her orthotics occasionally with a metatarsal pad, but was unable to fit it in her current shoes.  I reviewed the past medical history, family history, social history, surgical history, and allergies today and no changes were needed.  Please see the problem list section below in epic for further details.  Past Medical History: No past medical history on file. Past Surgical History: Past Surgical History:  Procedure Laterality Date  . APPENDECTOMY  1968  . DILATION AND CURETTAGE OF UTERUS  1990  . TONSILLECTOMY  1980   Social History: Social History   Socioeconomic History  . Marital status: Married    Spouse name: Ramon Dredge.   . Number of children: 1  . Years of education: Not on file  . Highest education level: Not on file  Occupational History  . Occupation: Retired Designer, television/film set: [employer]  Social Needs  . Financial resource strain: Not on file  . Food insecurity:    Worry: Not on file    Inability: Not on file  . Transportation needs:    Medical: Not on file    Non-medical: Not on file  Tobacco Use  . Smoking status: Never Smoker  . Smokeless tobacco: Never Used  Substance and Sexual Activity  . Alcohol use: Yes    Alcohol/week: 2.0 - 4.0 standard drinks    Types: 2 - 4 Glasses of wine per week    Comment: Occ  . Drug use: No  . 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  Lifestyle  . Physical activity:    Days  per week: Not on file    Minutes per session: Not on file  . Stress: Not on file  Relationships  . Social connections:    Talks on phone: Not on file    Gets together: Not on file    Attends religious service: Not on file    Active member of club or organization: Not on file    Attends meetings of clubs or organizations: Not on file    Relationship status: Not on file  Other Topics Concern  . Not on file  Social History Narrative   1 Caffeine drink per day. 5 days per week exercising. Retired.     Family History: Family History  Problem Relation Age of Onset  . Diabetes Father   . Hypertension Father   . Squamous cell carcinoma Father        skin   . Colon cancer Paternal Grandmother   . Alzheimer's disease Paternal Grandmother    Allergies: No Known Allergies Medications: See med rec.  Review of Systems: No fevers, chills, night sweats, weight loss, chest pain, or shortness of breath.   Objective:    General: Well Developed, well nourished, and in no acute distress.  Neuro: Alert and oriented x3, extra-ocular muscles intact, sensation grossly intact.  HEENT: Normocephalic, atraumatic, pupils equal round reactive to light, neck supple, no masses, no lymphadenopathy, thyroid nonpalpable.  Skin: Warm and dry,  no rashes. Cardiac: Regular rate and rhythm, no murmurs rubs or gallops, no lower extremity edema.  Respiratory: Clear to auscultation bilaterally. Not using accessory muscles, speaking in full sentences. Right foot:  Inspection and palpation reveals breakdown of the transverse arch and a drop of MT heads. Abnormal callus is present between the second, third, and fourth metatarsal heads. Hammer toes are present. TTP under the metatarsal heads.  Metatarsal pad placed in her right shoe in the sock liner.  She ambulated and this was comfortable.  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 I did explain to her that the numbness could last for several months. She will wear her orthotics as much as possible. I did place a metatarsal pad in her right shoe. Return to see me in a couple of months. She feels as though she can live with it now.  I spent 25 minutes with this patient, greater than 50% was face-to-face time counseling regarding the above diagnoses, specifically further diagnosis and treatment options of metatarsalgia and Morton's neuroma ___________________________________________ Ihor Austin. Benjamin Stain, M.D., ABFM., CAQSM. Primary Care and Sports Medicine [facility]  Adjunct Professor of Family Medicine  [employer]

## 2018-05-22 NOTE — Assessment & Plan Note (Signed)
Pain-free after injection at the last visit, return as needed

## 2018-06-01 ENCOUNTER — Other Ambulatory Visit (HOSPITAL_COMMUNITY): Payer: Self-pay | Admitting: Obstetrics & Gynecology

## 2018-06-01 DIAGNOSIS — Z1231 Encounter for screening mammogram for malignant neoplasm of breast: Secondary | ICD-10-CM

## 2018-07-19 ENCOUNTER — Encounter: Payer: Self-pay | Admitting: Family Medicine

## 2018-07-19 ENCOUNTER — Ambulatory Visit (INDEPENDENT_AMBULATORY_CARE_PROVIDER_SITE_OTHER): Payer: BC Managed Care – PPO

## 2018-07-19 ENCOUNTER — Encounter: Payer: Self-pay | Admitting: Obstetrics & Gynecology

## 2018-07-19 ENCOUNTER — Ambulatory Visit (INDEPENDENT_AMBULATORY_CARE_PROVIDER_SITE_OTHER): Payer: BC Managed Care – PPO | Admitting: Obstetrics & Gynecology

## 2018-07-19 ENCOUNTER — Ambulatory Visit (INDEPENDENT_AMBULATORY_CARE_PROVIDER_SITE_OTHER): Payer: BC Managed Care – PPO | Admitting: Family Medicine

## 2018-07-19 VITALS — BP 131/60 | HR 64 | Temp 97.6°F | Ht 64.5 in | Wt 119.0 lb

## 2018-07-19 VITALS — BP 115/65 | HR 69 | Ht 64.0 in | Wt 119.0 lb

## 2018-07-19 DIAGNOSIS — Z01419 Encounter for gynecological examination (general) (routine) without abnormal findings: Secondary | ICD-10-CM

## 2018-07-19 DIAGNOSIS — Z0000 Encounter for general adult medical examination without abnormal findings: Secondary | ICD-10-CM

## 2018-07-19 DIAGNOSIS — Z808 Family history of malignant neoplasm of other organs or systems: Secondary | ICD-10-CM

## 2018-07-19 DIAGNOSIS — Z1231 Encounter for screening mammogram for malignant neoplasm of breast: Secondary | ICD-10-CM

## 2018-07-19 NOTE — Patient Instructions (Signed)

## 2018-07-19 NOTE — Progress Notes (Signed)
Subjective:     Becky Bowers is a 62 y.o. female and is here for a comprehensive physical exam. The patient reports no problems.  Her father was just diagnosed with metastatic melanoma. She is upset by this news. She just found out today.    Social History   Socioeconomic History  . Marital status: Married    Spouse name: Ramon Dredge.   . Number of children: 1  . Years of education: Not on file  . Highest education level: Not on file  Occupational History  . Occupation: Retired Designer, television/film set: [employer]  Social Needs  . Financial resource strain: Not on file  . Food insecurity:    Worry: Not on file    Inability: Not on file  . Transportation needs:    Medical: Not on file    Non-medical: Not on file  Tobacco Use  . Smoking status: Never Smoker  . Smokeless tobacco: Never Used  Substance and Sexual Activity  . Alcohol use: Yes    Alcohol/week: 2.0 - 4.0 standard drinks    Types: 2 - 4 Glasses of wine per week    Comment: Occ  . Drug use: No  . 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  Lifestyle  . Physical activity:    Days per week: Not on file    Minutes per session: Not on file  . Stress: Not on file  Relationships  . Social connections:    Talks on phone: Not on file    Gets together: Not on file    Attends religious service: Not on file    Active member of club or organization: Not on file    Attends meetings of clubs or organizations: Not on file    Relationship status: Not on file  . Intimate partner violence:    Fear of current or ex partner: Not on file    Emotionally abused: Not on file    Physically abused: Not on file    Forced sexual activity: Not on file  Other Topics Concern  . Not on file  Social History Narrative   1 Caffeine drink per day. 5 days per week exercising.  Retired.     Health Maintenance  Topic Date Due  . MAMMOGRAM  07/14/2019  . PAP SMEAR-Modifier  07/12/2020  . TETANUS/TDAP  02/05/2021  . COLONOSCOPY  08/15/2024  . INFLUENZA VACCINE  Completed  . Hepatitis C Screening  Completed  . HIV Screening  Completed    The following portions of the patient's history were reviewed and updated as appropriate: allergies, current medications, past family history, past medical history, past social history, past surgical history and problem list.  Review of Systems A comprehensive review of systems was negative.   Objective:    BP 131/60   Pulse 64   Temp 97.6 F (36.4 C) (Oral)   Ht 5' 4.5" (1.638 m)   Wt 119 lb (54 kg)   SpO2 100%   BMI 20.11 kg/m  General appearance: alert, cooperative and appears stated age Head: Normocephalic, without obvious abnormality, atraumatic Eyes: conj clear, EOMI, PEERLA Ears: normal TM's and external ear canals both ears Nose: Nares normal. Septum midline. Mucosa normal. No drainage or sinus tenderness. Throat: lips, mucosa, and tongue normal; teeth and gums normal Neck: no adenopathy, no carotid bruit, no JVD, supple, symmetrical,  trachea midline and thyroid not enlarged, symmetric, no tenderness/mass/nodules Back: symmetric, no curvature. ROM normal. No CVA tenderness. Lungs: clear to auscultation bilaterally Heart: regular rate and rhythm, S1, S2 normal, no murmur, click, rub or gallop Abdomen: soft, non-tender; bowel sounds normal; no masses,  no organomegaly Extremities: extremities normal, atraumatic, no cyanosis or edema Pulses: 2+ and symmetric Skin: Skin color, texture, turgor normal. No rashes or lesions Lymph nodes: Cervical, supraclavicular, and axillary nodes normal. Neurologic: Alert and oriented X 3, normal strength and tone. Normal symmetric reflexes. Normal coordination and gait    Assessment:    Healthy female exam.     Plan:     See After Visit Summary for Counseling  Recommendations   Keep up a regular exercise program and make sure you are eating a healthy diet Try to eat 4 servings of dairy a day, or if you are lactose intolerant take a calcium with vitamin D daily.  Your vaccines are up to date.  Labs ordered.    Family history of melanoma-did encourage her to seek yearly consultation with dermatology.

## 2018-07-19 NOTE — Progress Notes (Signed)
Subjective:    Becky Bowers is a 62 y.o. married P1 67(61 yo daughter) female who presents for an annual exam. The patient has no complaints today. The patient is sexually active. GYN screening history: last pap: was normal. The patient wears seatbelts: yes. The patient participates in regular exercise: yes. Has the patient ever been transfused or tattooed?: no. The patient reports that there is not domestic violence in her life.   Menstrual History: OB History    Gravida  2   Para  1   Term      Preterm      AB  1   Living  1     SAB  1   TAB      Ectopic      Multiple      Live Births              Menarche age: [age] No LMP recorded. Patient is postmenopausal.    The following portions of the patient's history were reviewed and updated as appropriate: allergies, current medications, past family history, past medical history, past social history, past surgical history and problem list.  Review of Systems Pertinent items are noted in HPI.   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, melanoma in her father No gyn or breast cancer Mammogram today Colonoscopy UTD   Objective:    BP 115/65   Pulse 69   Ht 5\' 4"  (1.626 m)   Wt 119 lb (54 kg)   BMI 20.43 kg/m   General Appearance:    Alert, cooperative, no distress, appears stated age  Head:    Normocephalic, without obvious abnormality, atraumatic  Eyes:    PERRL, conjunctiva/corneas clear, EOM's intact, fundi    benign, both eyes  Ears:    Normal TM's and external ear canals, both ears  Nose:   Nares normal, septum midline, mucosa normal, no drainage    or sinus tenderness  Throat:   Lips, mucosa, and tongue normal; teeth and gums normal  Neck:   Supple, symmetrical, trachea midline, no adenopathy;    thyroid:  no enlargement/tenderness/nodules; no carotid   bruit or JVD  Back:     Symmetric, no curvature, ROM normal, no CVA tenderness  Lungs:     Clear to auscultation  bilaterally, respirations unlabored  Chest Wall:    No tenderness or deformity   Heart:    Regular rate and rhythm, S1 and S2 normal, no murmur, rub   or gallop  Breast Exam:    No tenderness, masses, or nipple abnormality  Abdomen:     Soft, non-tender, bowel sounds active all four quadrants,    no masses, no organomegaly  Genitalia:    Normal female without lesion, discharge or tenderness, mild vulvovaginal atrophy, normal speculum exam, normal size and shape, anteverted, mobile, non-tender, non-palpable adnexa      Extremities:   Extremities normal, atraumatic, no cyanosis or edema  Pulses:   2+ and symmetric all extremities  Skin:   Skin color, texture, turgor normal, no rashes or lesions  Lymph nodes:   Cervical, supraclavicular, and axillary nodes normal  Neurologic:   CNII-XII intact, normal strength, sensation and reflexes    throughout  .    Assessment:    Healthy female exam.    Plan:   pap not due this year  Invitae testing   Rec increase Vagifem from 2 times per week to 3 times per week.

## 2018-07-23 ENCOUNTER — Other Ambulatory Visit: Payer: Self-pay | Admitting: Family Medicine

## 2018-07-24 LAB — COMPLETE METABOLIC PANEL WITH GFR
AG RATIO: 2.3 (calc) (ref 1.0–2.5)
ALBUMIN MSPROF: 4.3 g/dL (ref 3.6–5.1)
ALT: 8 U/L (ref 6–29)
AST: 24 U/L (ref 10–35)
Alkaline phosphatase (APISO): 44 U/L (ref 33–130)
BILIRUBIN TOTAL: 0.6 mg/dL (ref 0.2–1.2)
BUN: 15 mg/dL (ref 7–25)
CHLORIDE: 103 mmol/L (ref 98–110)
CO2: 32 mmol/L (ref 20–32)
Calcium: 9.5 mg/dL (ref 8.6–10.4)
Creat: 0.75 mg/dL (ref 0.50–0.99)
GFR, EST AFRICAN AMERICAN: 100 mL/min/{1.73_m2} (ref >=60)
GFR, Est Non African American: 86 mL/min/{1.73_m2} (ref >=60)
GLOBULIN: 1.9 g/dL (ref 1.9–3.7)
GLUCOSE: 103 mg/dL — AB (ref 65–99)
Potassium: 4.5 mmol/L (ref 3.5–5.3)
SODIUM: 141 mmol/L (ref 135–146)
Total Protein: 6.2 g/dL (ref 6.1–8.1)

## 2018-07-24 LAB — LIPID PANEL
CHOLESTEROL: 176 mg/dL (ref <200)
HDL: 82 mg/dL (ref >50)
LDL CHOLESTEROL (CALC): 80 mg/dL
Non-HDL Cholesterol (Calc): 94 mg/dL (calc) (ref <130)
TRIGLYCERIDES: 68 mg/dL (ref <150)
Total CHOL/HDL Ratio: 2.1 (calc) (ref <5.0)

## 2018-08-07 ENCOUNTER — Other Ambulatory Visit: Payer: Self-pay | Admitting: Obstetrics & Gynecology

## 2018-08-07 DIAGNOSIS — B009 Herpesviral infection, unspecified: Secondary | ICD-10-CM

## 2018-09-26 ENCOUNTER — Other Ambulatory Visit: Payer: Self-pay | Admitting: Obstetrics & Gynecology

## 2018-10-09 ENCOUNTER — Ambulatory Visit (INDEPENDENT_AMBULATORY_CARE_PROVIDER_SITE_OTHER): Payer: BC Managed Care – PPO | Admitting: Family Medicine

## 2018-10-09 ENCOUNTER — Telehealth: Payer: Self-pay

## 2018-10-09 ENCOUNTER — Encounter: Payer: Self-pay | Admitting: Family Medicine

## 2018-10-09 VITALS — BP 115/73 | HR 82 | Temp 98.7°F | Ht 64.0 in | Wt 119.0 lb

## 2018-10-09 DIAGNOSIS — R0789 Other chest pain: Secondary | ICD-10-CM

## 2018-10-09 DIAGNOSIS — M810 Age-related osteoporosis without current pathological fracture: Secondary | ICD-10-CM

## 2018-10-09 DIAGNOSIS — R300 Dysuria: Secondary | ICD-10-CM | POA: Diagnosis not present

## 2018-10-09 DIAGNOSIS — R21 Rash and other nonspecific skin eruption: Secondary | ICD-10-CM

## 2018-10-09 LAB — POCT URINALYSIS DIPSTICK
Bilirubin, UA: NEGATIVE
Blood, UA: NEGATIVE
GLUCOSE UA: NEGATIVE
Leukocytes, UA: NEGATIVE
Nitrite, UA: NEGATIVE
Protein, UA: NEGATIVE
Spec Grav, UA: 1.025 (ref 1.010–1.025)
Urobilinogen, UA: 0.2 E.U./dL
pH, UA: 6 (ref 5.0–8.0)

## 2018-10-09 MED ORDER — IBANDRONATE SODIUM 150 MG PO TABS: 150.0000 mg | ORAL_TABLET | ORAL | 6 refills | Status: DC

## 2018-10-09 MED ORDER — PREDNISONE 20 MG PO TABS: ORAL_TABLET | ORAL | 0 refills | Status: DC

## 2018-10-09 NOTE — Addendum Note (Signed)
Addended by: Kathie Dike on: 10/09/2018 09:58 AM   Modules accepted: Orders

## 2018-10-09 NOTE — Telephone Encounter (Signed)
Becky Bowers called about a rash head to toe. The rash is burning and itching. She wanted to know if there is anything she could take before her 11 am appointment. I did advise Benadryl as long as she is not going to drive or a non sedating allergy medication. She was also advised of Eucerin cream.

## 2018-10-09 NOTE — Telephone Encounter (Signed)
Pt needed refill of Boniva. Refill sent in per Dr.Dove

## 2018-10-09 NOTE — Progress Notes (Signed)
Acute Office Visit  Subjective:    Patient ID: Becky Bowers, female    DOB: [birth year], 63 y.o.   MRN: [id number]  Chief Complaint  Patient presents with  . Rash    HPI Patient is in today for rash that started yesterday.  There is a little red itchy patch underneath her left breast.  And by the time she woke up this morning she had a diffuse rash across her abdomen and her low back.  Some of it is flat some of it is raised is not painful but it is itchy.  She is not taking anything for it.  She actually called her this morning and we had recommended Benadryl but she said she actually did not have any at home.  She was at [facility] with her father yesterday.  3-4 days prior to the rash she felt like a lump in her chest. Felt like her food was hanging there.  In fact over the weekend she said she woke up feeling some chest discomfort that was radiating into her back to the point where she almost went to the emergency department but decided not to.  That has gradually gotten better but has not completely resolved.  She did try some Tums and it did not really make a difference.  She is also noticed some burning with urination today.  But she is not sure if that is related to the rash as the rash is affecting her groin area.  She has not had any other cold or respiratory symptoms but did notice a little drainage in the corner of her right eye when she got up this morning.  No fevers chills or sweats.  She denies any new soaps, lotion, detergents, fabric softener, medications, supplements.  She denies eating anything unusual recently.  No recent seafood intake.  No past medical history on file.  Past Surgical History:  Procedure Laterality Date  . APPENDECTOMY  1968  . DILATION AND CURETTAGE OF UTERUS  1990  . TONSILLECTOMY  1980    Family History  Problem Relation Age of Onset  . Diabetes Father   . Hypertension Father   . Squamous cell carcinoma Father        skin   .  Colon cancer Paternal Grandmother   . Alzheimer's disease Paternal Grandmother     Social History   Socioeconomic History  . Marital status: Married    Spouse name: Ramon Dredge.   . Number of children: 1  . Years of education: Not on file  . Highest education level: Not on file  Occupational History  . Occupation: Retired Designer, television/film set: [employer]  Social Needs  . Financial resource strain: Not on file  . Food insecurity:    Worry: Not on file    Inability: Not on file  . Transportation needs:    Medical: Not on file    Non-medical: Not on file  Tobacco Use  . Smoking status: Never Smoker  . Smokeless tobacco: Never Used  Substance and Sexual Activity  . Alcohol use: Yes    Alcohol/week: 2.0 - 4.0 standard drinks    Types: 2 - 4 Glasses of wine per week    Comment: Occ  . Drug use: No  . 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  Lifestyle  . Physical activity:  Days per week: Not on file    Minutes per session: Not on file  . Stress: Not on file  Relationships  . Social connections:    Talks on phone: Not on file    Gets together: Not on file    Attends religious service: Not on file    Active member of club or organization: Not on file    Attends meetings of clubs or organizations: Not on file    Relationship status: Not on file  . Intimate partner violence:    Fear of current or ex partner: Not on file    Emotionally abused: Not on file    Physically abused: Not on file    Forced sexual activity: Not on file  Other Topics Concern  . Not on file  Social History Narrative   1 Caffeine drink per day. 5 days per week exercising. Retired.      Outpatient Medications Prior to Visit  Medication Sig Dispense Refill  . azelastine (ASTELIN) 0.1 % nasal spray INSTILL 1 SPRAY INTO EACH NOSTRIL  TWICE A DAY 30 mL 0  . Azelastine-Fluticasone 137-50 MCG/ACT SUSP Place 1 Act into the nose 2 (two) times daily. 23 g PRN  . Calcium Carbonate-Vitamin D (CALCIUM + D) 600-200 MG-UNIT TABS Take 1 tablet by mouth 2 (two) times daily.     . Estradiol 10 MCG TABS vaginal tablet 1 tablet per vagina daily 30 tablet 12  . fluticasone (FLONASE) 50 MCG/ACT nasal spray INSTILL 1 TO 2 SPRAYS INTO EACH NOSTRIL DAILY 16 g 0  . ibandronate (BONIVA) 150 MG tablet Take 1 tablet (150 mg total) by mouth every 30 (thirty) days. Take in the morning with a full glass of water, on an empty stomach, and do not take anything else by mouth or lie down for the next 30 min. 3 tablet 6  . L-Lysine 1000 MG TABS Take 1 tablet by mouth daily.    . Multiple Vitamins-Minerals (ONE-A-DAY WOMENS 50+ ADVANTAGE PO) Take 1 capsule by mouth daily.    . Omega-3 Fatty Acids (FISH OIL) 1000 MG CAPS Take 1 capsule by mouth daily.      . Pediatric Multivitamins-Iron (FLINTSTONES PLUS IRON PO) Take by mouth.    . valACYclovir (VALTREX) 1000 MG tablet TAKE 1 TABLET BY MOUTH AS NEEDED 30 tablet 3   No facility-administered medications prior to visit.     No Known Allergies  ROS     Objective:    Physical Exam  Constitutional: She is oriented to person, place, and time. She appears well-developed and well-nourished.  HENT:  Head: Normocephalic and atraumatic.  Right Ear: External ear normal.  Left Ear: External ear normal.  Nose: Nose normal.  Mouth/Throat: Oropharynx is clear and moist.  TMs and canals are clear.   Eyes: Pupils are equal, round, and reactive to light. Conjunctivae and EOM are normal.  Neck: Neck supple. No thyromegaly present.  Cardiovascular: Normal rate, regular rhythm and normal heart sounds.  Pulmonary/Chest: Effort normal and breath sounds normal. She has no wheezes.  Lymphadenopathy:    She has no cervical adenopathy.  Neurological: She is alert and oriented to person, place, and time.  Skin: Skin is  warm and dry.  Erythematous maculopapular rash that is quite large over her abdomen she has a few more discrete circular lesions approximately 1/2 to 1 cm in size on her forearms.  She has several coalesced lesions on her lower back.  No vesicles.  It extends down her  legs.  Psychiatric: She has a normal mood and affect.    BP 115/73   Pulse 82   Temp 98.7 F (37.1 C) (Oral)   Ht  (1.626 m)   Wt 119 lb (54 kg)   SpO2 100%   BMI 20.43 kg/m  Wt Readings from Last 3 Encounters:  10/09/18 119 lb (54 kg)  07/19/18 119 lb (54 kg)  07/19/18 119 lb (54 kg)    There are no preventive care reminders to display for this patient.  There are no preventive care reminders to display for this patient.   Lab Results  Component Value Date   TSH 0.55 08/21/2017   Lab Results  Component Value Date   WBC 6.5 08/21/2017   HGB 12.4 08/21/2017   HCT 36.4 08/21/2017   MCV 88.8 08/21/2017   PLT 246 08/21/2017   Lab Results  Component Value Date   NA 141 07/23/2018   K 4.5 07/23/2018   CO2 32 07/23/2018   GLUCOSE 103 (H) 07/23/2018   BUN 15 07/23/2018   CREATININE 0.75 07/23/2018   BILITOT 0.6 07/23/2018   ALKPHOS 51 07/06/2016   AST 24 07/23/2018   ALT 8 07/23/2018   PROT 6.2 07/23/2018   ALBUMIN 4.4 07/06/2016   CALCIUM 9.5 07/23/2018   Lab Results  Component Value Date   CHOL 176 07/23/2018   Lab Results  Component Value Date   HDL 82 07/23/2018   Lab Results  Component Value Date   LDLCALC 80 07/23/2018   Lab Results  Component Value Date   TRIG 68 07/23/2018   Lab Results  Component Value Date   CHOLHDL 2.1 07/23/2018   No results found for: HGBA1C     Assessment & Plan:   Problem List Items Addressed This Visit    None    Visit Diagnoses    Rash    -  Primary   Relevant Orders   Antistreptolysin O titer   COMPLETE METABOLIC PANEL WITH GFR   CBC with Differential/Platelet   TSH   Atypical chest pain       Relevant Orders   Antistreptolysin O  titer   COMPLETE METABOLIC PANEL WITH GFR   CBC with Differential/Platelet   TSH   Dysuria       Relevant Orders   POCT Urinalysis Dipstick (Completed)     Rash-unclear etiology.  No specific or known triggers.  We will do some additional work-up including labs.  Will treat with a round of oral prednisone.  If not better she will let us know.  Recommend treat with oral Benadryl 25 to 50 mg every 6 hours as needed for itching.  Consider viral as well specially with some mild conjunctivitis in the right eye.  But no other respiratory symptoms or fever.  Atypical chest pain-it seems like it is actually gotten better in the last couple days since the rash broke out.  But she is still having a little discomfort that she notices after eating or drinking.  If it continues consider esophageal stricture and consider GI referral.  Dysuria-urinalysis negative so likely just discomfort from the rash itself.   Meds ordered this encounter  Medications  . predniSONE (DELTASONE) 20 MG tablet    Sig: Take 2 tablets (40 mg total) by mouth daily with breakfast for 5 days, THEN 1 tablet (20 mg total) daily with breakfast for 5 days.    Dispense:  10 tablet    Refill:  0  Beatrice Lecher, MD

## 2018-10-10 ENCOUNTER — Other Ambulatory Visit: Payer: Self-pay

## 2018-10-10 LAB — CBC WITH DIFFERENTIAL/PLATELET
Absolute Monocytes: 689 cells/uL (ref 200–950)
BASOS ABS: 19 {cells}/uL (ref 0–200)
Basophils Relative: 0.2 %
Eosinophils Absolute: 19 cells/uL (ref 15–500)
Eosinophils Relative: 0.2 %
HCT: 43.9 % (ref 35.0–45.0)
Hemoglobin: 15.1 g/dL (ref 11.7–15.5)
Lymphs Abs: 1571 cells/uL (ref 850–3900)
MCH: 31.2 pg (ref 27.0–33.0)
MCHC: 34.4 g/dL (ref 32.0–36.0)
MCV: 90.7 fL (ref 80.0–100.0)
MPV: 12.6 fL — ABNORMAL HIGH (ref 7.5–12.5)
Monocytes Relative: 7.1 %
Neutro Abs: 7401 cells/uL (ref 1500–7800)
Neutrophils Relative %: 76.3 %
Platelets: 214 10*3/uL (ref 140–400)
RBC: 4.84 10*6/uL (ref 3.80–5.10)
RDW: 12.1 % (ref 11.0–15.0)
Total Lymphocyte: 16.2 %
WBC: 9.7 10*3/uL (ref 3.8–10.8)

## 2018-10-10 LAB — ANTISTREPTOLYSIN O TITER: ASO: <50 IU/mL (ref <200)

## 2018-10-10 LAB — COMPLETE METABOLIC PANEL WITH GFR
AG Ratio: 2.5 (calc) (ref 1.0–2.5)
ALT: 8 U/L (ref 6–29)
AST: 20 U/L (ref 10–35)
Albumin: 4.8 g/dL (ref 3.6–5.1)
Alkaline phosphatase (APISO): 48 U/L (ref 37–153)
BUN: 19 mg/dL (ref 7–25)
CO2: 31 mmol/L (ref 20–32)
Calcium: 10.1 mg/dL (ref 8.6–10.4)
Chloride: 97 mmol/L — ABNORMAL LOW (ref 98–110)
Creat: 0.9 mg/dL (ref 0.50–0.99)
GFR, Est African American: 80 mL/min/{1.73_m2} (ref >=60)
GFR, Est Non African American: 69 mL/min/{1.73_m2} (ref >=60)
Globulin: 1.9 g/dL (calc) (ref 1.9–3.7)
Glucose, Bld: 114 mg/dL (ref 65–139)
Potassium: 4.7 mmol/L (ref 3.5–5.3)
Sodium: 137 mmol/L (ref 135–146)
Total Bilirubin: 0.6 mg/dL (ref 0.2–1.2)
Total Protein: 6.7 g/dL (ref 6.1–8.1)

## 2018-10-10 LAB — TSH: TSH: 0.81 m[IU]/L (ref 0.40–4.50)

## 2018-10-10 MED ORDER — PREDNISONE 20 MG PO TABS: ORAL_TABLET | ORAL | 0 refills | Status: AC

## 2018-10-10 NOTE — Telephone Encounter (Signed)
Sent a message to Dr Linford Arnold about the quantity problem.

## 2018-10-28 ENCOUNTER — Other Ambulatory Visit: Payer: Self-pay | Admitting: Family Medicine

## 2018-10-28 DIAGNOSIS — J309 Allergic rhinitis, unspecified: Secondary | ICD-10-CM

## 2018-10-29 ENCOUNTER — Other Ambulatory Visit: Payer: Self-pay

## 2018-10-29 ENCOUNTER — Telehealth: Payer: Self-pay

## 2018-10-29 DIAGNOSIS — N951 Menopausal and female climacteric states: Secondary | ICD-10-CM

## 2018-10-29 DIAGNOSIS — J309 Allergic rhinitis, unspecified: Secondary | ICD-10-CM

## 2018-10-29 MED ORDER — ESTRADIOL 10 MCG VA TABS: ORAL_TABLET | VAGINAL | 3 refills | Status: DC

## 2018-10-29 MED ORDER — FLUTICASONE PROPIONATE 50 MCG/ACT NA SUSP: 2.0000 | Freq: Every day | NASAL | 6 refills | Status: DC

## 2018-10-29 MED ORDER — AZELASTINE HCL 0.1 % NA SOLN: 2.0000 | Freq: Two times a day (BID) | NASAL | 11 refills | Status: DC

## 2018-10-29 NOTE — Telephone Encounter (Signed)
Pt needs refill of Estradiol. Due to limiting pt appt because of COVID-19 3 refills have been sent.

## 2019-01-21 ENCOUNTER — Encounter: Payer: Self-pay | Admitting: Family Medicine

## 2019-01-21 MED ORDER — CELECOXIB 200 MG PO CAPS: 200.0000 mg | ORAL_CAPSULE | Freq: Two times a day (BID) | ORAL | 0 refills | Status: DC | PRN

## 2019-02-07 ENCOUNTER — Encounter: Payer: Self-pay | Admitting: Family Medicine

## 2019-02-07 NOTE — Telephone Encounter (Signed)
Okay to provide letter saying patient is safe to participate and that it is recommended that she be able to attend sessions for therapeutic reasons.

## 2019-03-22 ENCOUNTER — Other Ambulatory Visit: Payer: Self-pay

## 2019-03-22 ENCOUNTER — Ambulatory Visit (INDEPENDENT_AMBULATORY_CARE_PROVIDER_SITE_OTHER): Payer: BC Managed Care – PPO | Admitting: Sports Medicine

## 2019-03-22 DIAGNOSIS — M1811 Unilateral primary osteoarthritis of first carpometacarpal joint, right hand: Secondary | ICD-10-CM | POA: Diagnosis not present

## 2019-03-22 NOTE — Assessment & Plan Note (Signed)
Repeat first [place] injection, she has a bit of pain in the second MCP as well, we can inject this in the future if persistently painful. Last CMC injection was in September 2019.

## 2019-03-22 NOTE — Progress Notes (Signed)
Subjective:    CC: Right hand pain  HPI:  This is quite a pleasant 63 year old female, she is starting to have recurrence of pain in her right hand, base of the thumb, she has a history of CMC osteoarthritis last injected in September 2019.  She also has a bit of discomfort at the second MCP, mild, persistent, localized without radiation.  I reviewed the past medical history, family history, social history, surgical history, and allergies today and no changes were needed.  Please see the problem list section below in epic for further details.  Past Medical History: No past medical history on file. Past Surgical History: Past Surgical History:  Procedure Laterality Date  . APPENDECTOMY  1968  . DILATION AND CURETTAGE OF UTERUS  1990  . TONSILLECTOMY  1980   Social History: Social History   Socioeconomic History  . Marital status: Married    Spouse name: Percell Miller.   . Number of children: 1  . Years of education: Not on file  . Highest education level: Not on file  Occupational History  . 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 Financial resource strain: Not on file  . Food insecurity    Worry: Not on file    Inability: Not on file  . Transportation needs    Medical: Not on file    Non-medical: Not on file  Tobacco Use  . Smoking status: Never Smoker  . Smokeless tobacco: Never Used  Substance and Sexual Activity  . Alcohol use: Yes    Alcohol/week: 2.0 - 4.0 standard drinks    Types: 2 - 4 Glasses of wine per week    Comment: Occ  . Drug use: No  . 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  Lifestyle  . Physical activity    Days per week: Not on file    Minutes per session: Not on file  . Stress: Not on file  Relationships  . Social Herbalist on phone: Not  on file    Gets together: Not on file    Attends religious service: Not on file    Active member of club or organization: Not on file    Attends meetings of clubs or organizations: Not on file    Relationship status: Not on file  Other Topics Concern  . Not on file  Social History Narrative   1 Caffeine drink per day. 5 days per week exercising. Retired.     Family History: Family History  Problem Relation Age of Onset  . Diabetes Father   . Hypertension Father   . Squamous cell carcinoma Father        skin   . Colon cancer Paternal Grandmother   . Alzheimer's disease Paternal Grandmother    Allergies: No Known Allergies Medications: See med rec.  Review of Systems: No fevers, chills, night sweats, weight loss, chest pain, or shortness of breath.   Objective:    General: Well Developed, well nourished, and in no acute distress.  Neuro: Alert and oriented x3, extra-ocular muscles intact, sensation grossly intact.  HEENT: Normocephalic, atraumatic, pupils equal round reactive to light, neck supple, no masses, no lymphadenopathy, thyroid nonpalpable.  Skin: Warm and dry, no rashes. Cardiac: Regular rate and rhythm, no murmurs rubs or gallops, no lower extremity edema.  Respiratory: Clear  to auscultation bilaterally. Not using accessory muscles, speaking in full sentences.  Procedure: Real-time Ultrasound Guided injection of the right [facility] Device: GE Logiq E  Verbal informed consent obtained.  Time-out conducted.  Noted no overlying erythema, induration, or other signs of local infection.  Skin prepped in a sterile fashion.  Local anesthesia: Topical Ethyl chloride.  With sterile technique and under real time ultrasound guidance:  1/2 cc Kenalog 40, 1/2 cc lidocaine injected easily Completed without difficulty  Pain immediately resolved suggesting accurate placement of the medication.  Advised to call if fevers/chills, erythema, induration, drainage, or persistent  bleeding.  Images permanently stored and available for review in the ultrasound unit.  Impression: Technically successful ultrasound guided injection.  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 Last CMC injection was in September 2019.   ___________________________________________ Ihor Austinhomas J. Benjamin Stainhekkekandam, M.D., ABFM., CAQSM. Primary Care and Sports Medicine [facility]  Adjunct Professor of Family Medicine  [employer]

## 2019-03-28 ENCOUNTER — Encounter: Payer: Self-pay | Admitting: *Deleted

## 2019-03-28 ENCOUNTER — Other Ambulatory Visit: Payer: Self-pay | Admitting: Obstetrics & Gynecology

## 2019-03-28 MED ORDER — ADDYI 100 MG PO TABS: 100.0000 mg | ORAL_TABLET | Freq: Every day | ORAL | 12 refills | Status: DC

## 2019-03-28 NOTE — Progress Notes (Signed)
addyi prescribed per patient request

## 2019-03-29 ENCOUNTER — Ambulatory Visit: Payer: BC Managed Care – PPO | Admitting: Sports Medicine

## 2019-04-01 ENCOUNTER — Ambulatory Visit (INDEPENDENT_AMBULATORY_CARE_PROVIDER_SITE_OTHER): Payer: BC Managed Care – PPO | Admitting: Sports Medicine

## 2019-04-01 ENCOUNTER — Other Ambulatory Visit: Payer: Self-pay

## 2019-04-01 ENCOUNTER — Encounter: Payer: Self-pay | Admitting: Sports Medicine

## 2019-04-01 DIAGNOSIS — M1811 Unilateral primary osteoarthritis of first carpometacarpal joint, right hand: Secondary | ICD-10-CM | POA: Diagnosis not present

## 2019-04-01 NOTE — Progress Notes (Signed)
Subjective:    CC: Right hand pain  HPI: Right first CMC: Pain now resolved after injection at the last visit.  Right second MCP pain: Slight improvement with systemic effect from her CMC injection but still has significant pain, moderate, persistent, localized without radiation, moderate tightness, morning stiffness.  I reviewed the past medical history, family history, social history, surgical history, and allergies today and no changes were needed.  Please see the problem list section below in epic for further details.  Past Medical History: No past medical history on file. Past Surgical History: Past Surgical History:  Procedure Laterality Date  . APPENDECTOMY  1968  . DILATION AND CURETTAGE OF UTERUS  1990  . TONSILLECTOMY  1980   Social History: Social History   Socioeconomic History  . Marital status: Married    Spouse name: Ramon Dredgedward.   . Number of children: 1  . Years of education: Not on file  . Highest education level: Not on file  Occupational History  . Occupation: Retired Designer, television/film setBand Teacher    Employer: [employer]  Social Needs  . Financial resource strain: Not on file  . Food insecurity    Worry: Not on file    Inability: Not on file  . Transportation needs    Medical: Not on file    Non-medical: Not on file  Tobacco Use  . Smoking status: Never Smoker  . Smokeless tobacco: Never Used  Substance and Sexual Activity  . Alcohol use: Yes    Alcohol/week: 2.0 - 4.0 standard drinks    Types: 2 - 4 Glasses of wine per week    Comment: Occ  . Drug use: No  . 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  Lifestyle  . Physical activity    Days per week: Not on file    Minutes per session: Not on file  . Stress: Not on file  Relationships  . Social Musicianconnections    Talks on phone: Not on file    Gets  together: Not on file    Attends religious service: Not on file    Active member of club or organization: Not on file    Attends meetings of clubs or organizations: Not on file    Relationship status: Not on file  Other Topics Concern  . Not on file  Social History Narrative   1 Caffeine drink per day. 5 days per week exercising. Retired.     Family History: Family History  Problem Relation Age of Onset  . Diabetes Father   . Hypertension Father   . Squamous cell carcinoma Father        skin   . Colon cancer Paternal Grandmother   . Alzheimer's disease Paternal Grandmother    Allergies: No Known Allergies Medications: See med rec.  Review of Systems: No fevers, chills, night sweats, weight loss, chest pain, or shortness of breath.   Objective:    General: Well Developed, well nourished, and in no acute distress.  Neuro: Alert and oriented x3, extra-ocular muscles intact, sensation grossly intact.  HEENT: Normocephalic, atraumatic, pupils equal round reactive to light, neck supple, no masses, no lymphadenopathy, thyroid nonpalpable.  Skin: Warm and dry, no rashes. Cardiac: Regular rate and rhythm, no murmurs rubs or gallops, no lower extremity edema.  Respiratory: Clear to auscultation bilaterally. Not using accessory muscles, speaking in full sentences. Right hand:  Tender to palpation at the second MCP.  Procedure: Real-time Ultrasound Guided injection of the right second MCP Device: GE Logiq E  Verbal informed consent obtained.  Time-out conducted.  Noted no overlying erythema, induration, or other signs of local infection.  Skin prepped in a sterile fashion.  Local anesthesia: Topical Ethyl chloride.  With sterile technique and under real time ultrasound guidance:  1/2 cc Kenalog 40, 1/2 cc lidocaine injected easily Completed without difficulty  Pain immediately resolved suggesting accurate placement of the medication.  Advised to call if fevers/chills, erythema,  induration, drainage, or persistent bleeding.  Images permanently stored and available for review in the ultrasound unit.  Impression: Technically successful ultrasound guided injection.  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 Return to see me in 1 month. Previous CMC injection was in September 2019.   ___________________________________________ Gwen Her. Dianah Field, M.D., ABFM., CAQSM. Primary Care and Sports Medicine [facility]  Adjunct Professor of [place] [employer] [facility] [employer] [facility] [employer]

## 2019-04-01 NOTE — Assessment & Plan Note (Signed)
Right first [facility] is doing well, second MCP is still hurting so we injected this today. Return to see me in 1 month. Previous CMC injection was in September 2019.

## 2019-04-29 ENCOUNTER — Ambulatory Visit: Payer: BC Managed Care – PPO | Admitting: Sports Medicine

## 2019-05-01 ENCOUNTER — Other Ambulatory Visit: Payer: Self-pay

## 2019-05-01 ENCOUNTER — Ambulatory Visit (INDEPENDENT_AMBULATORY_CARE_PROVIDER_SITE_OTHER): Payer: BC Managed Care – PPO | Admitting: Sports Medicine

## 2019-05-01 ENCOUNTER — Encounter: Payer: Self-pay | Admitting: Sports Medicine

## 2019-05-01 DIAGNOSIS — M1811 Unilateral primary osteoarthritis of first carpometacarpal joint, right hand: Secondary | ICD-10-CM | POA: Diagnosis not present

## 2019-05-01 NOTE — Assessment & Plan Note (Signed)
Right first [place] continues to do well, I injected her second MCP at the last visit so this is pain-free. Return as needed.

## 2019-05-01 NOTE — Progress Notes (Signed)
Subjective:    CC: Follow-up  HPI: Stephonie returns, I injected her right second metacarpal phalangeal joint at the last visit she returns today pain-free.  I reviewed the past medical history, family history, social history, surgical history, and allergies today and no changes were needed.  Please see the problem list section below in epic for further details.  Past Medical History: No past medical history on file. Past Surgical History: Past Surgical History:  Procedure Laterality Date  . APPENDECTOMY  1968  . DILATION AND CURETTAGE OF UTERUS  1990  . TONSILLECTOMY  1980   Social History: Social History   Socioeconomic History  . Marital status: Married    Spouse name: Ramon Dredge.   . Number of children: 1  . Years of education: Not on file  . Highest education level: Not on file  Occupational History  . Occupation: Retired Designer, television/film set: [employer]  Social Needs  . Financial resource strain: Not on file  . Food insecurity    Worry: Not on file    Inability: Not on file  . Transportation needs    Medical: Not on file    Non-medical: Not on file  Tobacco Use  . Smoking status: Never Smoker  . Smokeless tobacco: Never Used  Substance and Sexual Activity  . Alcohol use: Yes    Alcohol/week: 2.0 - 4.0 standard drinks    Types: 2 - 4 Glasses of wine per week    Comment: Occ  . Drug use: No  . 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  Lifestyle  . Physical activity    Days per week: Not on file    Minutes per session: Not on file  . Stress: Not on file  Relationships  . Social Musician on phone: Not on file    Gets together: Not on file    Attends religious service: Not on file    Active member of club or organization: Not on file    Attends meetings of clubs or organizations: Not  on file    Relationship status: Not on file  Other Topics Concern  . Not on file  Social History Narrative   1 Caffeine drink per day. 5 days per week exercising. Retired.     Family History: Family History  Problem Relation Age of Onset  . Diabetes Father   . Hypertension Father   . Squamous cell carcinoma Father        skin   . Colon cancer Paternal Grandmother   . Alzheimer's disease Paternal Grandmother    Allergies: No Known Allergies Medications: See med rec.  Review of Systems: No fevers, chills, night sweats, weight loss, chest pain, or shortness of breath.   Objective:    General: Well Developed, well nourished, and in no acute distress.  Neuro: Alert and oriented x3, extra-ocular muscles intact, sensation grossly intact.  HEENT: Normocephalic, atraumatic, pupils equal round reactive to light, neck supple, no masses, no lymphadenopathy, thyroid nonpalpable.  Skin: Warm and dry, no rashes. Cardiac: Regular rate and rhythm, no murmurs rubs or gallops, no lower extremity edema.  Respiratory: Clear to auscultation bilaterally. Not using accessory muscles, speaking in full sentences.  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 Return as needed.   ___________________________________________ Gwen Her. Dianah Field, M.D., ABFM., CAQSM. Primary Care and Sports Medicine [facility]  Adjunct Professor of [place] [employer] [facility] [employer]

## 2019-06-12 ENCOUNTER — Other Ambulatory Visit: Payer: Self-pay | Admitting: Obstetrics & Gynecology

## 2019-06-12 DIAGNOSIS — Z1231 Encounter for screening mammogram for malignant neoplasm of breast: Secondary | ICD-10-CM

## 2019-07-21 ENCOUNTER — Encounter: Payer: Self-pay | Admitting: Family Medicine

## 2019-07-21 DIAGNOSIS — Z0000 Encounter for general adult medical examination without abnormal findings: Secondary | ICD-10-CM

## 2019-07-24 ENCOUNTER — Other Ambulatory Visit: Payer: Self-pay

## 2019-07-24 ENCOUNTER — Ambulatory Visit (INDEPENDENT_AMBULATORY_CARE_PROVIDER_SITE_OTHER): Payer: BC Managed Care – PPO | Admitting: Obstetrics & Gynecology

## 2019-07-24 ENCOUNTER — Encounter: Payer: Self-pay | Admitting: Obstetrics & Gynecology

## 2019-07-24 ENCOUNTER — Ambulatory Visit (INDEPENDENT_AMBULATORY_CARE_PROVIDER_SITE_OTHER): Payer: BC Managed Care – PPO

## 2019-07-24 VITALS — BP 119/70 | HR 70 | Temp 98.3°F | Ht 64.0 in | Wt 121.0 lb

## 2019-07-24 DIAGNOSIS — N951 Menopausal and female climacteric states: Secondary | ICD-10-CM

## 2019-07-24 DIAGNOSIS — Z01419 Encounter for gynecological examination (general) (routine) without abnormal findings: Secondary | ICD-10-CM | POA: Diagnosis not present

## 2019-07-24 DIAGNOSIS — Z124 Encounter for screening for malignant neoplasm of cervix: Secondary | ICD-10-CM

## 2019-07-24 DIAGNOSIS — Z1151 Encounter for screening for human papillomavirus (HPV): Secondary | ICD-10-CM

## 2019-07-24 DIAGNOSIS — Z1231 Encounter for screening mammogram for malignant neoplasm of breast: Secondary | ICD-10-CM | POA: Diagnosis not present

## 2019-07-24 MED ORDER — ESTRADIOL 10 MCG VA TABS: ORAL_TABLET | VAGINAL | 6 refills | Status: DC

## 2019-07-24 NOTE — Progress Notes (Signed)
Last pap- 07/12/17- negative Had mammogram today

## 2019-07-24 NOTE — Progress Notes (Signed)
Subjective:    Becky Bowers is a 63 y.o. married P1 (32 yo daughter)  who presents for an annual exam. The patient has no complaints today. She has a decreased libido, doesn't want Addyi. The patient is sexually active. GYN screening history: last pap: was normal. The patient wears seatbelts: yes. The patient participates in regular exercise: yes. Has the patient ever been transfused or tattooed?: no. The patient reports that there is not domestic violence in her life.   Menstrual History: OB History    Gravida  2   Para  1   Term      Preterm      AB  1   Living  1     SAB  1   TAB      Ectopic      Multiple      Live Births              Menarche age: 37 No LMP recorded. Patient is postmenopausal.    The following portions of the patient's history were reviewed and updated as appropriate: allergies, current medications, past family history, past medical history, past social history, past surgical history and problem list.  Review of Systems Pertinent items are noted in HPI.   Married for 63 years Happy with estradiol FH- no breast/gyn cancer, + melanoma in father and colon cancer in paternal GM Retired Water quality scientist in middle school   Objective:    BP 119/70   Pulse 70   Temp 98.3 F (36.8 C)   Ht 5\' 4"  (1.626 m)   Wt 121 lb (54.9 kg)   BMI 20.77 kg/m   General Appearance:    Alert, cooperative, no distress, appears stated age  Head:    Normocephalic, without obvious abnormality, atraumatic  Eyes:    PERRL, conjunctiva/corneas clear, EOM's intact, fundi    benign, both eyes  Ears:    Normal TM's and external ear canals, both ears  Nose:   Nares normal, septum midline, mucosa normal, no drainage    or sinus tenderness  Throat:   Lips, mucosa, and tongue normal; teeth and gums normal  Neck:   Supple, symmetrical, trachea midline, no adenopathy;    thyroid:  no enlargement/tenderness/nodules; no carotid   bruit or JVD  Back:     Symmetric, no  curvature, ROM normal, no CVA tenderness  Lungs:     Clear to auscultation bilaterally, respirations unlabored  Chest Wall:    No tenderness or deformity   Heart:    Regular rate and rhythm, S1 and S2 normal, no murmur, rub   or gallop  Breast Exam:    No tenderness, masses, or nipple abnormality  Abdomen:     Soft, non-tender, bowel sounds active all four quadrants,    no masses, no organomegaly  Genitalia:    Normal female without lesion, discharge or tenderness     Extremities:   Extremities normal, atraumatic, no cyanosis or edema  Pulses:   2+ and symmetric all extremities  Skin:   Skin color, texture, turgor normal, no rashes or lesions  Lymph nodes:   Cervical, supraclavicular, and axillary nodes normal  Neurologic:   CNII-XII intact, normal strength, sensation and reflexes    throughout  .    Assessment:    Healthy female exam.    Plan:     Thin prep Pap smear. with cotesting Refill of estradiol Trial of test ointment

## 2019-07-25 LAB — CYTOLOGY - PAP
Comment: NEGATIVE
Diagnosis: NEGATIVE
High risk HPV: NEGATIVE

## 2019-07-26 ENCOUNTER — Other Ambulatory Visit: Payer: Self-pay | Admitting: Obstetrics & Gynecology

## 2019-07-29 ENCOUNTER — Telehealth: Payer: Self-pay

## 2019-07-29 NOTE — Telephone Encounter (Addendum)
Rx placed with [facility]. ----- Message from Emily Filbert, MD sent at 07/26/2019 11:23 AM EST ----- Can you please call into her pharmacy and prescribe testosterone ointment 2% Apply small amount 3 times per week to wrist. #5 grams and 6 refills. Thanks

## 2019-07-29 NOTE — Telephone Encounter (Addendum)
Called pt's pharmacy on file ([facility]) and they cannot fill this prescription. [facility]ist [facility]. MyChart message sent to pt to see what [facility] other [facility] she would like [facility] sent to.   ----- Message from Emily Filbert, MD sent at 07/26/2019 11:23 AM EST ----- Can you please call into her pharmacy and prescribe testosterone ointment 2% Apply small amount 3 times per week to wrist. #5 grams and 6 refills. Thanks

## 2019-08-13 ENCOUNTER — Encounter: Payer: Self-pay | Admitting: Family Medicine

## 2019-08-13 ENCOUNTER — Ambulatory Visit (INDEPENDENT_AMBULATORY_CARE_PROVIDER_SITE_OTHER): Payer: BC Managed Care – PPO | Admitting: Family Medicine

## 2019-08-13 ENCOUNTER — Other Ambulatory Visit: Payer: Self-pay

## 2019-08-13 VITALS — BP 99/52 | HR 57 | Ht 64.0 in | Wt 118.0 lb

## 2019-08-13 DIAGNOSIS — M8580 Other specified disorders of bone density and structure, unspecified site: Secondary | ICD-10-CM | POA: Diagnosis not present

## 2019-08-13 DIAGNOSIS — Z0000 Encounter for general adult medical examination without abnormal findings: Secondary | ICD-10-CM

## 2019-08-13 DIAGNOSIS — R42 Dizziness and giddiness: Secondary | ICD-10-CM

## 2019-08-13 NOTE — Progress Notes (Signed)
Subjective:     Becky Bowers is a 64 y.o. female and is here for a comprehensive physical exam. The patient reports problems - lightheaded with change in position.  She is just noticed that occasionally when she stands up quickly she will notice she feels lightheadedness.  She says it does not happen frequently, just occasionally.  She says it happened a little bit this morning when she was getting ready.  Blood pressure was actually quite low today.  Still exercising 5 to 6 days a week.  Walks 3 miles and also doing weekly Pilates classes.  Social History   Socioeconomic History  . Marital status: Married    Spouse name: Ramon Dredge.   . Number of children: 1  . Years of education: Not on file  . Highest education level: Not on file  Occupational History  . Occupation: Retired Designer, television/film set: [employer]  Tobacco Use  . Smoking status: Never Smoker  . Smokeless tobacco: Never Used  Substance and Sexual Activity  . Alcohol use: Yes    Alcohol/week: 2.0 - 4.0 standard drinks    Types: 2 - 4 Glasses of wine per week    Comment: Occ  . Drug use: No  . 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  Other Topics Concern  . Not on file  Social History Narrative   1 Caffeine drink per day. 5 days per week exercising. Retired.     Social Determinants of Health   Financial Resource Strain:   . Difficulty of Paying Living Expenses: Not on file  Food Insecurity:   . Worried About Programme researcher, broadcasting/film/video in the Last Year: Not on file  . Ran Out of Food in the Last Year: Not on file  Transportation Needs:   . Lack of Transportation (Medical): Not on file  . Lack of Transportation (Non-Medical): Not on file  Physical Activity:   . Days of Exercise per Week: Not on file  . Minutes of Exercise per Session: Not on file  Stress:    . Feeling of Stress : Not on file  Social Connections:   . Frequency of Communication with Friends and Family: Not on file  . Frequency of Social Gatherings with Friends and Family: Not on file  . Attends Religious Services: Not on file  . Active Member of Clubs or Organizations: Not on file  . Attends Banker Meetings: Not on file  . Marital Status: Not on file  Intimate Partner Violence:   . Fear of Current or Ex-Partner: Not on file  . Emotionally Abused: Not on file  . Physically Abused: Not on file  . Sexually Abused: Not on file   Health Maintenance  Topic Date Due  . TETANUS/TDAP  02/05/2021  . MAMMOGRAM  07/23/2021  . PAP SMEAR-Modifier  07/23/2022  . COLONOSCOPY  08/15/2024  . INFLUENZA VACCINE  Completed  . Hepatitis C Screening  Completed  . HIV Screening  Completed    The following portions of the patient's history were reviewed and updated as appropriate: allergies, current medications, past family history, past medical history, past social history, past surgical history and problem list.  Review of Systems A comprehensive review of systems was negative. , except + dizziness occ with position change.   Objective:    BP (!) 99/52   Pulse [name])  57   Ht 5\' 4"  (1.626 m)   Wt 118 lb (53.5 kg)   SpO2 100%   BMI 20.25 kg/m  General appearance: alert, cooperative and appears stated age Head: Normocephalic, without obvious abnormality, atraumatic Eyes: conj clear, EOMI, PEERLA Ears: normal TM's and external ear canals both ears Nose: Nares normal. Septum midline. Mucosa normal. No drainage or sinus tenderness. Throat: lips, mucosa, and tongue normal; teeth and gums normal Neck: no adenopathy, no carotid bruit, no JVD, supple, symmetrical, trachea midline and thyroid not enlarged, symmetric, no tenderness/mass/nodules Back: symmetric, no curvature. ROM normal. No CVA tenderness. Lungs: clear to auscultation bilaterally Heart: regular rate and rhythm,  S1, S2 normal, no murmur, click, rub or gallop Abdomen: soft, non-tender; bowel sounds normal; no masses,  no organomegaly Extremities: extremities normal, atraumatic, no cyanosis or edema Pulses: 2+ and symmetric Skin: Skin color, texture, turgor normal. No rashes or lesions Lymph nodes: Cervical, supraclavicular, and axillary nodes normal. Neurologic: Alert and oriented X 3, normal strength and tone. Normal symmetric reflexes. Normal coordination and gait    Assessment:    Healthy female exam.      Plan:     See After Visit Summary for Counseling Recommendations   Keep up a regular exercise program and make sure you are eating a healthy diet Try to eat 4 servings of dairy a day, or if you are lactose intolerant take a calcium with vitamin D daily.  Your vaccines are up to date.   Lightheadedness with position change-she is not currently on any blood pressure medications and tends to have low blood pressure anyway but today it is particularly low with a systolic less than 960 to though she was fasting.  We discussed maybe getting a home blood pressure cuff and trying to track it on those days where she feels lightheaded to see if maybe it is running a little bit lower.  She says she hydrates consistently drinking about 64 ounces of fluid daily.  We discussed liberalizing salt intake which can help as well.  If any point she is noticing chest pain shortness of breath or low pulse rate then to please let me know and we can work this up further.  Osteopenia-due for repeat DEXA.  Order placed today.

## 2019-08-13 NOTE — Patient Instructions (Signed)
Health Maintenance, Female Adopting a healthy lifestyle and getting preventive care are important in promoting health and wellness. Ask your health care provider about:  The right schedule for you to have regular tests and exams.  Things you can do on your own to prevent diseases and keep yourself healthy. What should I know about diet, weight, and exercise? Eat a healthy diet   Eat a diet that includes plenty of vegetables, fruits, low-fat dairy products, and lean protein.  Do not eat a lot of foods that are high in solid fats, added sugars, or sodium. Maintain a healthy weight Body mass index (BMI) is used to identify weight problems. It estimates body fat based on height and weight. Your health care provider can help determine your BMI and help you achieve or maintain a healthy weight. Get regular exercise Get regular exercise. This is one of the most important things you can do for your health. Most adults should:  Exercise for at least 150 minutes each week. The exercise should increase your heart rate and make you sweat (moderate-intensity exercise).  Do strengthening exercises at least twice a week. This is in addition to the moderate-intensity exercise.  Spend less time sitting. Even light physical activity can be beneficial. Watch cholesterol and blood lipids Have your blood tested for lipids and cholesterol at 63 years of age, then have this test every 5 years. Have your cholesterol levels checked more often if:  Your lipid or cholesterol levels are high.  You are older than 63 years of age.  You are at high risk for heart disease. What should I know about cancer screening? Depending on your health history and family history, you may need to have cancer screening at various ages. This may include screening for:  Breast cancer.  Cervical cancer.  Colorectal cancer.  Skin cancer.  Lung cancer. What should I know about heart disease, diabetes, and high blood  pressure? Blood pressure and heart disease  High blood pressure causes heart disease and increases the risk of stroke. This is more likely to develop in people who have high blood pressure readings, are of African descent, or are overweight.  Have your blood pressure checked: ? Every 3-5 years if you are 18-39 years of age. ? Every year if you are 40 years old or older. Diabetes Have regular diabetes screenings. This checks your fasting blood sugar level. Have the screening done:  Once every three years after age 40 if you are at a normal weight and have a low risk for diabetes.  More often and at a younger age if you are overweight or have a high risk for diabetes. What should I know about preventing infection? Hepatitis B If you have a higher risk for hepatitis B, you should be screened for this virus. Talk with your health care provider to find out if you are at risk for hepatitis B infection. Hepatitis C Testing is recommended for:  Everyone born from 1945 through 1965.  Anyone with known risk factors for hepatitis C. Sexually transmitted infections (STIs)  Get screened for STIs, including gonorrhea and chlamydia, if: ? You are sexually active and are younger than 63 years of age. ? You are older than 63 years of age and your health care provider tells you that you are at risk for this type of infection. ? Your sexual activity has changed since you were last screened, and you are at increased risk for chlamydia or gonorrhea. Ask your health care provider if   you are at risk.  Ask your health care provider about whether you are at high risk for HIV. Your health care provider may recommend a prescription medicine to help prevent HIV infection. If you choose to take medicine to prevent HIV, you should first get tested for HIV. You should then be tested every 3 months for as long as you are taking the medicine. Pregnancy  If you are about to stop having your period (premenopausal) and  you may become pregnant, seek counseling before you get pregnant.  Take 400 to 800 micrograms (mcg) of folic acid every day if you become pregnant.  Ask for birth control (contraception) if you want to prevent pregnancy. Osteoporosis and menopause Osteoporosis is a disease in which the bones lose minerals and strength with aging. This can result in bone fractures. If you are 65 years old or older, or if you are at risk for osteoporosis and fractures, ask your health care provider if you should:  Be screened for bone loss.  Take a calcium or vitamin D supplement to lower your risk of fractures.  Be given hormone replacement therapy (HRT) to treat symptoms of menopause. Follow these instructions at home: Lifestyle  Do not use any products that contain nicotine or tobacco, such as cigarettes, e-cigarettes, and chewing tobacco. If you need help quitting, ask your health care provider.  Do not use street drugs.  Do not share needles.  Ask your health care provider for help if you need support or information about quitting drugs. Alcohol use  Do not drink alcohol if: ? Your health care provider tells you not to drink. ? You are pregnant, may be pregnant, or are planning to become pregnant.  If you drink alcohol: ? Limit how much you use to 0-1 drink a day. ? Limit intake if you are breastfeeding.  Be aware of how much alcohol is in your drink. In the [place] one drink equals one 12 oz bottle of beer (355 mL), one 5 oz glass of wine (148 mL), or one 1 oz glass of hard liquor (44 mL). General instructions  Schedule regular health, dental, and eye exams.  Stay current with your vaccines.  Tell your health care provider if: ? You often feel depressed. ? You have ever been abused or do not feel safe at home. Summary  Adopting a healthy lifestyle and getting preventive care are important in promoting health and wellness.  Follow your health care provider's instructions about healthy  diet, exercising, and getting tested or screened for diseases.  Follow your health care provider's instructions on monitoring your cholesterol and blood pressure. This information is not intended to replace advice given to you by your health care provider. Make sure you discuss any questions you have with your health care provider. Document Revised: 07/18/2018 Document Reviewed: 07/18/2018 Elsevier Patient Education  2020 Elsevier Inc.  

## 2019-08-14 ENCOUNTER — Ambulatory Visit (INDEPENDENT_AMBULATORY_CARE_PROVIDER_SITE_OTHER): Payer: BC Managed Care – PPO

## 2019-08-14 ENCOUNTER — Other Ambulatory Visit: Payer: Self-pay

## 2019-08-14 DIAGNOSIS — M8580 Other specified disorders of bone density and structure, unspecified site: Secondary | ICD-10-CM | POA: Diagnosis not present

## 2019-08-14 LAB — LIPID PANEL
Cholesterol: 197 mg/dL (ref <200)
HDL: 87 mg/dL (ref >=50)
LDL Cholesterol (Calc): 93 mg/dL (calc)
Non-HDL Cholesterol (Calc): 110 mg/dL (calc) (ref <130)
Total CHOL/HDL Ratio: 2.3 (calc) (ref <5.0)
Triglycerides: 77 mg/dL (ref <150)

## 2019-08-14 LAB — COMPLETE METABOLIC PANEL WITH GFR
AG Ratio: 2.1 (calc) (ref 1.0–2.5)
ALT: 7 U/L (ref 6–29)
AST: 23 U/L (ref 10–35)
Albumin: 4.7 g/dL (ref 3.6–5.1)
Alkaline phosphatase (APISO): 62 U/L (ref 37–153)
BUN/Creatinine Ratio: 35 (calc) — ABNORMAL HIGH (ref 6–22)
BUN: 28 mg/dL — ABNORMAL HIGH (ref 7–25)
CO2: 27 mmol/L (ref 20–32)
Calcium: 9.5 mg/dL (ref 8.6–10.4)
Chloride: 103 mmol/L (ref 98–110)
Creat: 0.8 mg/dL (ref 0.50–0.99)
GFR, Est African American: 92 mL/min/{1.73_m2} (ref >=60)
GFR, Est Non African American: 79 mL/min/{1.73_m2} (ref >=60)
Globulin: 2.2 g/dL (calc) (ref 1.9–3.7)
Glucose, Bld: 97 mg/dL (ref 65–99)
Potassium: 4.8 mmol/L (ref 3.5–5.3)
Sodium: 139 mmol/L (ref 135–146)
Total Bilirubin: 0.3 mg/dL (ref 0.2–1.2)
Total Protein: 6.9 g/dL (ref 6.1–8.1)

## 2019-08-14 LAB — CBC
HCT: 42.1 % (ref 35.0–45.0)
Hemoglobin: 14 g/dL (ref 11.7–15.5)
MCH: 30.7 pg (ref 27.0–33.0)
MCHC: 33.3 g/dL (ref 32.0–36.0)
MCV: 92.3 fL (ref 80.0–100.0)
MPV: 11.9 fL (ref 7.5–12.5)
Platelets: 198 10*3/uL (ref 140–400)
RBC: 4.56 10*6/uL (ref 3.80–5.10)
RDW: 12 % (ref 11.0–15.0)
WBC: 5.7 10*3/uL (ref 3.8–10.8)

## 2019-08-16 LAB — HM COLONOSCOPY

## 2019-09-12 ENCOUNTER — Ambulatory Visit (INDEPENDENT_AMBULATORY_CARE_PROVIDER_SITE_OTHER): Payer: BC Managed Care – PPO | Admitting: Family Medicine

## 2019-09-12 ENCOUNTER — Encounter: Payer: Self-pay | Admitting: Family Medicine

## 2019-09-12 ENCOUNTER — Other Ambulatory Visit: Payer: Self-pay

## 2019-09-12 DIAGNOSIS — S060X9A Concussion with loss of consciousness of unspecified duration, initial encounter: Secondary | ICD-10-CM | POA: Insufficient documentation

## 2019-09-12 DIAGNOSIS — S060X0A Concussion without loss of consciousness, initial encounter: Secondary | ICD-10-CM

## 2019-09-12 DIAGNOSIS — S060XAA Concussion with loss of consciousness status unknown, initial encounter: Secondary | ICD-10-CM | POA: Insufficient documentation

## 2019-09-12 HISTORY — DX: Concussion with loss of consciousness status unknown, initial encounter: S06.0XAA

## 2019-09-12 NOTE — Progress Notes (Signed)
Becky Bowers - 64 y.o. female MRN [id number]  Date of birth: Dec 25, 1955  Subjective Chief Complaint  Patient presents with  . Nausea    hit head on corner of cabinet Monday- knot, sore, nausea intermittent, H/A until yesterday, dizziness if turns too quickly    HPI Becky Bowers is a 64 y.o. female here today for acute visit with complaint of head injury.  She reports that she was tying her shoe 4 days ago and hit her head on the corner of a cabinet when sitting up. She denies LOC.   She had mild headache that day followed by some dizziness that evening and following day.  Dizziness is worse with movement of her head.  More prominent when going from lying to seated position, really noticed at her dentist earlier today.  She does have a small know on the back of her scalp.  She has some mild nausea associated with her dizziness.  She also has felt a little more fatigued.  She denies tinnitus, vision change, syncope.   ROS:  A comprehensive ROS was completed and negative except as noted per HPI  No Known Allergies  No past medical history on file.  Past Surgical History:  Procedure Laterality Date  . APPENDECTOMY  1968  . DILATION AND CURETTAGE OF UTERUS  1990  . TONSILLECTOMY  1980    Social History   Socioeconomic History  . Marital status: Married    Spouse name: Ramon Dredge.   . Number of children: 1  . Years of education: Not on file  . Highest education level: Not on file  Occupational History  . Occupation: Retired Designer, television/film set: [employer]  Tobacco Use  . Smoking status: Never Smoker  . Smokeless tobacco: Never Used  Substance and Sexual Activity  . Alcohol use: Yes    Alcohol/week: 2.0 - 4.0 standard drinks    Types: 2 - 4 Glasses of wine per week    Comment: Occ  . Drug use: No  . 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  Other Topics Concern  . Not on file  Social History Narrative   1 Caffeine drink per day. 5 days per week exercising. Retired.     Social Determinants of Health   Financial Resource Strain:   . Difficulty of Paying Living Expenses: Not on file  Food Insecurity:   . Worried About Programme researcher, broadcasting/film/video in the Last Year: Not on file  . Ran Out of Food in the Last Year: Not on file  Transportation Needs:   . Lack of Transportation (Medical): Not on file  . Lack of Transportation (Non-Medical): Not on file  Physical Activity:   . Days of Exercise per Week: Not on file  . Minutes of Exercise per Session: Not on file  Stress:   . Feeling of Stress : Not on file  Social Connections:   . Frequency of Communication with Friends and Family: Not on file  . Frequency of Social Gatherings with Friends and Family: Not on file  . Attends Religious Services: Not on file  . Active Member of Clubs or Organizations: Not on file  . Attends Banker Meetings: Not on file  . Marital Status: Not on file    Family History  Problem Relation Age of Onset  . Diabetes Father   .  Hypertension Father   . Squamous cell carcinoma Father        skin   . Colon cancer Paternal Grandmother   . Alzheimer's disease Paternal Grandmother     Health Maintenance  Topic Date Due  . TETANUS/TDAP  02/05/2021  . MAMMOGRAM  07/23/2021  . PAP SMEAR-Modifier  07/23/2022  . COLONOSCOPY  08/15/2024  . INFLUENZA VACCINE  Completed  . Hepatitis C Screening  Completed  . HIV Screening  Completed    ----------------------------------------------------------------------------------------------------------------------------------------------------------------------------------------------------------------- Physical Exam BP 117/65   Pulse 76   Wt 123 lb (55.8 kg)   SpO2 100%   BMI 21.11 kg/m   Physical Exam Constitutional:      Appearance: Normal appearance.  HENT:      Head:     Comments: Small nodule/hematoma to posterior R scalp.  No laceration noted.  TTP.     Right Ear: Tympanic membrane and ear canal normal.     Left Ear: Tympanic membrane and ear canal normal.     Mouth/Throat:     Mouth: Mucous membranes are moist.  Cardiovascular:     Rate and Rhythm: Normal rate and regular rhythm.  Pulmonary:     Effort: Pulmonary effort is normal.     Breath sounds: Normal breath sounds.  Musculoskeletal:     Cervical back: Normal range of motion.  Skin:    General: Skin is warm and dry.  Neurological:     General: No focal deficit present.     Mental Status: She is alert and oriented to person, place, and time. Mental status is at baseline.     Cranial Nerves: No cranial nerve deficit.     Sensory: No sensory deficit.     Motor: No weakness.     Gait: Gait normal.     Comments: Negative dix hallpike. Mild dizziness with sitting up.   Psychiatric:        Mood and Affect: Mood normal.        Behavior: Behavior normal.     ------------------------------------------------------------------------------------------------------------------------------------------------------------------------------------------------------------------- Assessment and Plan  Concussion Discussed management of concussion is often supportive including relative rest, limiting time on electronic screens, maintaining adequate hydration and nutrition.    We discussed adding a Omega-3 DHA supplement which may help with recovery.  Discussed that symptoms may last from a few days to a few weeks but on average she could expect recovery within a few days to a week.   I asked that she let us know if she has persistent symptoms or develops any new or worsening symptoms.   35 minutes spent on encounter including time for previsit planning, face to face time, counseling and coordination of care and same day documentation as noted above.     This visit occurred during the SARS-CoV-2  public health emergency.  Safety protocols were in place, including screening questions prior to the visit, additional usage of staff PPE, and extensive cleaning of exam room while observing appropriate contact time as indicated for disinfecting solutions.

## 2019-09-12 NOTE — Patient Instructions (Signed)
Try adding ~1000mg  of Omega 3 DHA each day  Concussion, Adult  A concussion is a brain injury from a hard, direct hit (trauma) to the head or body. This direct hit causes the brain to shake quickly back and forth inside the skull. This can damage brain cells and cause chemical changes in the brain. A concussion may also be known as a mild traumatic brain injury (TBI). Concussions are usually not life-threatening, but the effects of a concussion can be serious. If you have a concussion, you should be very careful to avoid having a second concussion. What are the causes? This condition is caused by:  A direct hit to your head, such as: ? Running into another player during a game. ? Being hit in a fight. ? Hitting your head on a hard surface.  Sudden movement of your body that causes your brain to move back and forth inside the skull, such as in a car crash. What are the signs or symptoms? The signs of a concussion can be hard to notice. Early on, they may be missed by you, family members, and health care providers. You may look fine on the outside but may act or feel differently. Symptoms are usually temporary and most often improve in 7-10 days. Some symptoms appear right away, but other symptoms may not show up for hours or days. If your symptoms last longer than normal, you may have post-concussion syndrome. Every head injury is different. Physical symptoms  Headaches. This can include a feeling of pressure in the head or migraine-like symptoms.  Tiredness (fatigue).  Dizziness.  Problems with coordination or balance.  Vision or hearing problems.  Sensitivity to light or noise.  Nausea or vomiting.  Changes in eating or sleeping patterns.  Numbness or tingling.  Seizure. Mental and emotional symptoms  Memory problems.  Trouble concentrating, organizing, or making decisions.  Slowness in thinking, acting or reacting, speaking, or reading.  Irritability or mood changes.   Anxiety or depression. How is this diagnosed? This condition is diagnosed based on:  Your symptoms.  A description of your injury. You may also have tests, including:  Imaging tests, such as a CT scan or MRI.  Neuropsychological tests. These measure your thinking, understanding, learning, and remembering abilities. How is this treated? Treatment for this condition includes:  Stopping sports or activity if you are injured. If you hit your head or show signs of concussion: ? Do not return to sports or activities the same day. ? Get checked by a health care provider before you return to your activities.  Physical and mental rest and careful observation, usually at home. Gradually return to your normal activities.  Medicines to help with symptoms such as headaches, nausea, or difficulty sleeping. ? Avoid taking opioid pain medicine while recovering from a concussion.  Avoiding alcohol and drugs. These may slow your recovery and can put you at risk of further injury.  Referral to a concussion clinic or rehabilitation center. Recovery from a concussion can take time. How fast you recover depends on many factors. Return to activities only when:  Your symptoms are completely gone.  Your health care provider says that it is safe. Follow these instructions at home: Activity  Limit activities that require a lot of thought or concentration, such as: ? Doing homework or job-related work. ? Watching TV. ? Working on the computer or phone. ? Playing memory games and puzzles.  Rest. Rest helps your brain heal. Make sure you: ? Get plenty  of sleep. Most adults should get 7-9 hours of sleep each night. ? Rest during the day. Take naps or rest breaks when you feel tired.  Avoid physical activity like exercise until your health care provider says it is safe. Stop any activity that worsens symptoms.  Do not do high-risk activities that could cause a second concussion, such as riding a  bike or playing sports.  Ask your health care provider when you can return to your normal activities, such as school, work, athletics, and driving. Your ability to react may be slower after a brain injury. Never do these activities if you are dizzy. Your health care provider will likely give you a plan for gradually returning to activities. General instructions   Take over-the-counter and prescription medicines only as told by your health care provider. Some medicines, such as blood thinners (anticoagulants) and aspirin, may increase the risk for complications, such as bleeding.  Do not drink alcohol until your health care provider says you can.  Watch your symptoms and tell others around you to do the same. Complications sometimes occur after a concussion. Older adults with a brain injury may have a higher risk of serious complications.  Tell your work Production designer, theatre/television/film, teachers, Tax adviser, school counselor, coach, or Event organiser about your injury, symptoms, and restrictions.  Keep all follow-up visits as told by your health care provider. This is important. How is this prevented? Avoiding another brain injury is very important. In rare cases, another injury can lead to permanent brain damage, brain swelling, or death. The risk of this is greatest during the first 7-10 days after a head injury. Avoid injuries by:  Stopping activities that could lead to a second concussion, such as contact or recreational sports, until your health care provider says it is okay.  Taking these actions once you have returned to sports or activities: ? Avoiding plays or moves that can cause you to crash into another person. This is how most concussions occur. ? Following the rules and being respectful of other players. Do not engage in violent or illegal plays.  Getting regular exercise that includes strength and balance training.  Wearing a properly fitting helmet during sports, biking, or other activities.  Helmets can help protect you from serious skull and brain injuries, but they do not protect you from a concussion. Even when wearing a helmet, you should avoid being hit in the head. Contact a health care provider if:  Your symptoms get worse or they do not improve.  You have new symptoms.  You have another injury. Get help right away if:  You have severe or worsening headaches.  You have weakness or numbness in any part of your body.  You are confused.  Your coordination gets worse.  You vomit repeatedly.  You are sleepier than normal.  Your speech is slurred.  You cannot recognize people or places.  You have a seizure.  It is difficult to wake you up.  You have unusual behavior changes.  You have changes in your vision.  You lose consciousness. Summary  A concussion is a brain injury that results from a hard, direct hit (trauma) to your head or body.  You may have imaging tests and neuropsychological tests to diagnose a concussion.  Treatment for this condition includes physical and mental rest and careful observation.  Ask your health care provider when you can return to your normal activities, such as school, work, athletics, and driving.  Get help right away if you  have a severe headache, weakness on one side of the body, seizures, behavior changes, changes in vision, or if you are confused or sleepier than normal. This information is not intended to replace advice given to you by your health care provider. Make sure you discuss any questions you have with your health care provider. Document Revised: 03/15/2018 Document Reviewed: 03/15/2018 Elsevier Patient Education  2020 [employer].

## 2019-09-12 NOTE — Assessment & Plan Note (Signed)
Discussed management of concussion is often supportive including relative rest, limiting time on electronic screens, maintaining adequate hydration and nutrition.    We discussed adding a Omega-3 DHA supplement which may help with recovery.  Discussed that symptoms may last from a few days to a few weeks but on average she could expect recovery within a few days to a week.   I asked that she let us know if she has persistent symptoms or develops any new or worsening symptoms.

## 2019-10-11 ENCOUNTER — Encounter: Payer: Self-pay | Admitting: Family Medicine

## 2019-10-25 ENCOUNTER — Encounter: Payer: Self-pay | Admitting: Family Medicine

## 2019-10-25 ENCOUNTER — Other Ambulatory Visit: Payer: Self-pay | Admitting: Family Medicine

## 2019-10-28 MED ORDER — CELECOXIB 200 MG PO CAPS: 200.0000 mg | ORAL_CAPSULE | Freq: Two times a day (BID) | ORAL | 0 refills | Status: DC | PRN

## 2019-11-13 ENCOUNTER — Encounter: Payer: Self-pay | Admitting: Osteopathic Medicine

## 2019-11-13 ENCOUNTER — Ambulatory Visit (INDEPENDENT_AMBULATORY_CARE_PROVIDER_SITE_OTHER): Payer: BC Managed Care – PPO | Admitting: Osteopathic Medicine

## 2019-11-13 ENCOUNTER — Other Ambulatory Visit: Payer: Self-pay

## 2019-11-13 VITALS — BP 125/82 | HR 51 | Temp 98.1°F | Wt 124.0 lb

## 2019-11-13 DIAGNOSIS — K120 Recurrent oral aphthae: Secondary | ICD-10-CM

## 2019-11-13 MED ORDER — DEXAMETHASONE 0.5 MG/5ML PO SOLN: 0.5000 mg | Freq: Four times a day (QID) | ORAL | 0 refills | Status: DC

## 2019-11-13 NOTE — Patient Instructions (Signed)
Canker Sores  Canker sores are small, painful sores that develop inside your mouth. You can get one or more canker sores on the inside of your lips or cheeks, on your tongue, or anywhere inside your mouth. Canker sores cannot be passed from person to person (are not contagious). These sores are different from the sores that you may get on the outside of your lips (cold sores or fever blisters). What are the causes? The cause of this condition is not known. The condition may be passed down from a parent (genetic). What increases the risk? This condition is more likely to develop in:  Women.  People in their teens or 20s.  Women who are having their menstrual period.  People who are under a lot of emotional stress.  People who do not get enough iron or B vitamins.  People who do not take care of their mouth and teeth (have poor oral hygiene).  People who have an injury inside the mouth, such as after having dental work or from chewing something hard. What are the signs or symptoms? Canker sores usually start as painful red bumps. Then they turn into small white, yellow, or gray sores that have red borders. The sores may be painful, and the pain may get worse when you eat or drink. Along with the canker sore, symptoms may also include:  Fever.  Fatigue.  Swollen lymph nodes in your neck. How is this diagnosed? This condition may be diagnosed based on your symptoms and an exam of the inside of your mouth. If you get canker sores often or if they are very bad, you may have tests, such as:  Blood tests to rule out possible causes.  Swabbing a fluid sample from the sore to be tested for infection.  Removing a small tissue sample from the sore (biopsy) to test it for cancer. How is this treated? Most canker sores go away without treatment in about 1 week. Home care is usually the only treatment that you will need. Over-the-counter medicines can relieve discomfort. If you have severe  canker sores, your health care provider may prescribe:  Numbing ointment to relieve pain. ? Do not use numbing gels or products containing benzocaine in children who are 2 years of age or younger.  Vitamins.  Steroid medicines. These may be given as pills, mouth rinses, or gels.  Antibiotic mouth rinse. Follow these instructions at home:   Apply, take, or use over-the-counter and prescription medicines only as told by your health care provider. These include vitamins and ointments.  If you were prescribed an antibiotic mouth rinse, use it as told by your health care provider. Do not stop using the antibiotic even if your condition improves.  Until the sores are healed: ? Do not drink coffee or citrus juices. ? Do not eat spicy or salty foods.  Use a mild, over-the-counter mouth rinse as recommended by your health care provider.  Practice good oral hygiene by: ? Flossing your teeth every day. ? Brushing your teeth with a soft toothbrush twice each day. Contact a health care provider if:  Your symptoms do not get better after 2 weeks.  You also have a fever or swollen glands in your neck.  You get canker sores often.  You have a canker sore that is getting larger.  You cannot eat or drink due to your canker sores. Summary  Canker sores are small, painful sores that develop inside your mouth.  Canker sores usually start as painful   red bumps that turn into small white, yellow, or gray sores that have red borders.  The sores may be quite painful, and the pain may get worse when you eat or drink.  Most canker sores clear up without treatment in about 1 week. Home care is usually the only treatment that you will need. Over-the-counter medicines can relieve discomfort. This information is not intended to replace advice given to you by your health care provider. Make sure you discuss any questions you have with your health care provider. Document Revised: 07/07/2017 Document  Reviewed: 05/01/2017 Elsevier Patient Education  2020 [employer]  

## 2019-11-14 LAB — IRON,TIBC AND FERRITIN PANEL
%SAT: 32 % (calc) (ref 16–45)
Ferritin: 112 ng/mL (ref 16–288)
Iron: 102 ug/dL (ref 45–160)
TIBC: 323 mcg/dL (calc) (ref 250–450)

## 2019-11-14 LAB — B12 AND FOLATE PANEL
Folate: >24 ng/mL
Vitamin B-12: 685 pg/mL (ref 200–1100)

## 2019-11-14 NOTE — Progress Notes (Signed)
Becky Bowers is a 64 y.o. female who presents to  [facility]  today, 11/14/19, seeking care for the following: [name] Mouth sores ongoing a few weeks, better but not gone      ASSESSMENT & PLAN with other pertinent history/findings:  The encounter diagnosis was Aphthous ulcer of mouth.  On exam, not severe  Mucosa largely intact Normal dentition  Treat as below R/o more common deficiencies   Orders Placed This Encounter  Procedures  . B12 and Folate Panel  . Fe+TIBC+Fer    Meds ordered this encounter  Medications  . dexamethasone (DECADRON) 0.5 MG/5ML solution    Sig: Take 5 mLs (0.5 mg total) by mouth in the morning, at noon, in the evening, and at bedtime for 10 days. Swish and spit    Dispense:  240 mL    Refill:  0       Follow-up instructions: Return if symptoms worsen or fail to improve.                                         BP 125/82 (BP Location: Left Arm, Patient Position: Sitting, Cuff Size: Normal)   Pulse (!) 51   Temp 98.1 F (36.7 C) (Oral)   Wt 124 lb 0.6 oz (56.3 kg)   BMI 21.29 kg/m   Current Meds  Medication Sig  . AMBULATORY NON FORMULARY MEDICATION Testosterone 2% cream  . azelastine (ASTELIN) 0.1 % nasal spray Place 2 sprays into both nostrils 2 (two) times daily. Use in each nostril as directed  . Azelastine-Fluticasone 137-50 MCG/ACT SUSP Place 1 Act into the nose 2 (two) times daily.  . Calcium Carbonate-Vitamin D (CALCIUM + D) 600-200 MG-UNIT TABS Take 1 tablet by mouth 2 (two) times daily.   . celecoxib (CELEBREX) 200 MG capsule Take 1 capsule (200 mg total) by mouth 2 (two) times daily as needed.  . Estradiol 10 MCG TABS vaginal tablet 1 tablet per vagina daily  . fluticasone (FLONASE) 50 MCG/ACT nasal spray Place 2 sprays into both nostrils daily.  [name] ibandronate (BONIVA) 150 MG tablet Take 1 tablet (150 mg total) by mouth every 30  (thirty) days. Take in the morning with a full glass of water, on an empty stomach, and do not take anything else by mouth or lie down for the next 30 min.  [name] L-Lysine 1000 MG TABS Take 1 tablet by mouth daily.  . Multiple Vitamins-Minerals (ONE-A-DAY WOMENS 50+ ADVANTAGE PO) Take 1 capsule by mouth daily.  . Pediatric Multivitamins-Iron (FLINTSTONES PLUS IRON PO) Take by mouth.  . valACYclovir (VALTREX) 1000 MG tablet TAKE 1 TABLET BY MOUTH AS NEEDED    Results for orders placed or performed in visit on 11/13/19 (from the past 72 hour(s))  B12 and Folate Panel     Status: None   Collection Time: 11/13/19  3:44 PM  Result Value Ref Range   Vitamin B-12 685 200 - 1,100 pg/mL   Folate >24.0 ng/mL    Comment:                            Reference Range                            Low:           <  3.4                            Borderline:    3.4-5.4                            Normal:        >5.4 .   Fe+TIBC+Fer     Status: None   Collection Time: 11/13/19  3:44 PM  Result Value Ref Range   Iron 102 45 - 160 mcg/dL   TIBC 323 250 - 450 mcg/dL (calc)   %SAT 32 16 - 45 % (calc)   Ferritin 112 16 - 288 ng/mL    No results found.  Depression screen [facility] 2/9 08/13/2019 07/13/2017 10/24/2016  Decreased Interest 0 0 0  Down, Depressed, Hopeless 0 0 0  PHQ - 2 Score 0 0 0    No flowsheet data found.    All questions at time of visit were answered - patient instructed to contact office with any additional concerns or updates.  ER/RTC precautions were reviewed with the patient.  Please note: voice recognition software was used to produce this document, and typos may escape review. Please contact Dr. Sheppard Coil for any needed clarifications.

## 2019-12-30 ENCOUNTER — Encounter: Payer: Self-pay | Admitting: *Deleted

## 2019-12-31 ENCOUNTER — Encounter: Payer: Self-pay | Admitting: Family Medicine

## 2019-12-31 DIAGNOSIS — M810 Age-related osteoporosis without current pathological fracture: Secondary | ICD-10-CM

## 2019-12-31 MED ORDER — IBANDRONATE SODIUM 150 MG PO TABS: 150.0000 mg | ORAL_TABLET | ORAL | 4 refills | Status: DC

## 2020-02-06 ENCOUNTER — Encounter: Payer: Self-pay | Admitting: Nurse Practitioner

## 2020-02-06 ENCOUNTER — Encounter: Payer: Self-pay | Admitting: Family Medicine

## 2020-02-06 ENCOUNTER — Ambulatory Visit (INDEPENDENT_AMBULATORY_CARE_PROVIDER_SITE_OTHER): Payer: BC Managed Care – PPO | Admitting: Nurse Practitioner

## 2020-02-06 VITALS — BP 103/70 | HR 57 | Temp 98.1°F | Ht 64.0 in | Wt 125.5 lb

## 2020-02-06 DIAGNOSIS — K120 Recurrent oral aphthae: Secondary | ICD-10-CM | POA: Diagnosis not present

## 2020-02-06 DIAGNOSIS — K130 Diseases of lips: Secondary | ICD-10-CM | POA: Diagnosis not present

## 2020-02-06 MED ORDER — LIDOCAINE VISCOUS HCL 2 % MT SOLN: 15.0000 mL | OROMUCOSAL | 1 refills | Status: DC | PRN

## 2020-02-06 MED ORDER — DEXAMETHASONE 0.5 MG/5ML PO SOLN: 0.5000 mg | Freq: Four times a day (QID) | ORAL | 1 refills | Status: AC

## 2020-02-06 NOTE — Progress Notes (Signed)
Acute Office Visit  Subjective:    Patient ID: Becky Bowers, female    DOB: [birth year], 64 y.o.   MRN: [id number]  Chief Complaint  Patient presents with   Mouth Lesions    seen on 11/13/2019 for same reason, has returned, mouth is tender, woke up yesterday and her lips were swollen, couple of sores in her mouth    HPI Patient is in today for oral mucosa and tongue tenderness, ulcerations, and edema of the lips that started yesterday morning. This is a repeat of symptoms that she experienced approximately 3 months ago, however, she reports that she has sought care earlier this time and that the ulcerations are not as numerous or deep. She states that her oral mucosa and tongue feel raw. Foods, drinks, toothpaste, and mouthwash are painful when in contact with the area.   She reports that in March of this year she developed oral ulcerations, pain, and lip edema for no known reason. She was prescribed an oral steroid rinse, which cleared the symptoms. She is unsure what is causing the problem.   She denies changes to medications, food, drink, toothpaste, mouthwash, make-up, lip coatings, lotions, creams, soaps, dish detergents, or nail polish. She denies fever, chills, throat swelling, tongue swelling, rash, joint pain, or upper respiratory symptoms.   She has no history of STI and is married in a monogamous relationship. Her partner does not have similar symptoms. She endorses the only similarities in the two incidents is that she had been to the beach both times when the lesions erupted, but nothing different from other times she has been to the beach. She does report she received her COVID vaccine in April.   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 She also endorses chronic chapped lips with daily Vaseline use.   History reviewed. No pertinent past medical history.  Past Surgical History:  Procedure Laterality Date   APPENDECTOMY   1968   DILATION AND CURETTAGE OF UTERUS  1990   TONSILLECTOMY  1980    Family History  Problem Relation Age of Onset   Diabetes Father    Hypertension Father    Squamous cell carcinoma Father        skin    Colon cancer Paternal Grandmother    Alzheimer's disease Paternal Grandmother     Social History   Socioeconomic History   Marital status: Married    Spouse name: Ramon Dredge.    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  Other Topics Concern   Not on file  Social History Narrative   1 Caffeine drink per day. 5 days per week exercising. Retired.     Social Determinants of Health   Financial Resource Strain:    Difficulty of Paying Living Expenses:   Food Insecurity:  Worried About Programme researcher, broadcasting/film/video in the Last Year:    Barista in the Last Year:   Transportation Needs:    Freight forwarder (Medical):    Lack of Transportation (Non-Medical):   Physical Activity:    Days of Exercise per Week:    Minutes of Exercise per Session:   Stress:    Feeling of Stress :   Social Connections:    Frequency of Communication with Friends and Family:    Frequency of Social Gatherings with Friends and Family:    Attends Religious Services:    Active Member of Clubs or Organizations:    Attends Engineer, structural:    Marital Status:   Intimate  Partner Violence:    Fear of Current or Ex-Partner:    Emotionally Abused:    Physically Abused:    Sexually Abused:     Outpatient Medications Prior to Visit  Medication Sig Dispense Refill   azelastine (ASTELIN) 0.1 % nasal spray Place 2 sprays into both nostrils 2 (two) times daily. Use in each nostril as directed 30 mL 11   Azelastine-Fluticasone 137-50 MCG/ACT SUSP Place 1 Act into the nose 2 (two) times daily. 23 g PRN   Calcium Carbonate-Vitamin D (CALCIUM + D) 600-200 MG-UNIT TABS Take 1 tablet by mouth 2 (two) times daily.      celecoxib (CELEBREX) 200 MG capsule Take 1 capsule (200 mg total) by mouth 2 (two) times daily as needed. 180 capsule 0   Estradiol 10 MCG TABS vaginal tablet 1 tablet per vagina daily 30 tablet 6   fluticasone (FLONASE) 50 MCG/ACT nasal spray Place 2 sprays into both nostrils daily. 16 g 6   ibandronate (BONIVA) 150 MG tablet Take 1 tablet (150 mg total) by mouth every 30 (thirty) days. Take in the morning with a full glass of water, on an empty stomach, and do not take anything else by mouth or lie down for the next 30 min. 3 tablet 4   L-Lysine 1000 MG TABS Take 1 tablet by mouth daily.     Multiple Vitamins-Minerals (ONE-A-DAY WOMENS 50+ ADVANTAGE PO) Take 1 capsule by mouth daily.     valACYclovir (VALTREX) 1000 MG tablet TAKE 1 TABLET BY MOUTH AS NEEDED 30 tablet 3   AMBULATORY NON FORMULARY MEDICATION Testosterone 2% cream     Pediatric Multivitamins-Iron (FLINTSTONES PLUS IRON PO) Take by mouth.     No facility-administered medications prior to visit.    No Known Allergies    Objective:    Physical Exam Vitals and nursing note reviewed.  Constitutional:      Appearance: Normal appearance. She is normal weight.  HENT:     Head: Normocephalic.     Nose: Nose normal.     Mouth/Throat:     Lips: Lesions present.     Mouth: Mucous membranes are moist. Oral lesions present.     Dentition: Normal dentition. No dental tenderness,  gingival swelling, dental caries or dental abscesses.     Tongue: No lesions. Tongue does not deviate from midline.     Palate: No mass and lesions.     Pharynx: Oropharynx is clear. Posterior oropharyngeal erythema present. No pharyngeal swelling, oropharyngeal exudate or uvula swelling.     Tonsils: No tonsillar exudate or tonsillar abscesses.      Comments: Erythema to oral mucosa and lips. No visible ulcerations present, but areas of erythematous lesions are scattered along the oral mucosa inside the bottom lip. Angular  cheilitis present bilaterally. No visible edema to lips.  Eyes:     Extraocular Movements: Extraocular movements intact.     Conjunctiva/sclera: Conjunctivae normal.     Pupils: Pupils are equal, round, and reactive to light.  Cardiovascular:     Rate and Rhythm: Normal rate and regular rhythm.     Pulses: Normal pulses.  Pulmonary:     Effort: Pulmonary effort is normal.     Breath sounds: Normal breath sounds.  Musculoskeletal:        General: Normal range of motion.     Cervical back: Normal range of motion.  Lymphadenopathy:     Cervical: No cervical adenopathy.  Skin:    General: Skin is warm and dry.     Capillary Refill: Capillary refill takes less than 2 seconds.  Neurological:     General: No focal deficit present.     Mental Status: She is alert and oriented to person, place, and time.  Psychiatric:        Mood and Affect: Mood normal.        Behavior: Behavior normal.        Thought Content: Thought content normal.        Judgment: Judgment normal.     BP 103/70    Pulse (!) 57    Temp 98.1 F (36.7 C) (Oral)    Ht 5\' 4"  (1.626 m)    Wt 125 lb 8 oz (56.9 kg)    SpO2 99%    BMI 21.54 kg/m  Wt Readings from Last 3 Encounters:  02/06/20 125 lb 8 oz (56.9 kg)  11/13/19 124 lb 0.6 oz (56.3 kg)  09/12/19 123 lb (55.8 kg)    There are no preventive care reminders to display for this patient.  There are no preventive care reminders to display for  this patient.   Lab Results  Component Value Date   TSH 0.81 10/09/2018   Lab Results  Component Value Date   WBC 5.7 08/13/2019   HGB 14.0 08/13/2019   HCT 42.1 08/13/2019   MCV 92.3 08/13/2019   PLT 198 08/13/2019   Lab Results  Component Value Date   NA 139 08/13/2019   K 4.8 08/13/2019   CO2 27 08/13/2019   GLUCOSE 97 08/13/2019   BUN 28 (H) 08/13/2019   CREATININE 0.80 08/13/2019   BILITOT 0.3 08/13/2019   ALKPHOS 51 07/06/2016   AST 23 08/13/2019   ALT 7 08/13/2019   PROT 6.9 08/13/2019   ALBUMIN 4.4 07/06/2016   CALCIUM 9.5 08/13/2019   Lab Results  Component Value Date   CHOL 197 08/13/2019   Lab Results  Component Value Date   HDL 87 08/13/2019   Lab Results  Component Value Date   LDLCALC 93 08/13/2019   Lab Results  Component Value Date   TRIG 77 08/13/2019   Lab Results  Component Value Date   CHOLHDL 2.3 08/13/2019   No results found for: HGBA1C     Assessment & Plan:  1. Aphthous ulcer of mouth Symptoms and presentation consistent with Pattye Meda aphthous ulcerations of the oral mucosa and lips. It is unclear the etiology of this second eruption in 3 months time. Her current symptoms are not as severe as when she sought care the first time, but she reports the initial symptoms are consistent with her last outbreak. This does not appear to be an outbreak of HSV1. Considering the possibility of a form of cheilitis, but feel that further evaluation is  necessary to make a specific diagnosis. Labs performed for B12, folate, and iron panel were normal with last outbreak. She does not have any history or signs of autoimmune disorder that would lead me to believe this could be from that source. Will plan to treat today with oral dexamethasone rinse and viscous lidocaine for symptom relief. We will plan to refer to ENT for further evaluation. Discussed emergency symptoms of angioedema, shortness of breath, chest pain, or palpitations that would warrant  immediate evaluation.   PLAN: - Dexamethasone and viscous lidocaine oral rinses - Referral to ENT for evaluation -  Follow-up if symptoms worsen or fail to improve.   - dexamethasone (DECADRON) 0.5 MG/5ML solution; Take 5 mLs (0.5 mg total) by mouth in the morning, at noon, in the evening, and at bedtime for 10 days. Swish and spit  Dispense: 240 mL; Refill: 1 - lidocaine (XYLOCAINE) 2 % solution; Use as directed 15 mLs in the mouth or throat every 3 (three) hours as needed (mouth/throat pain - gargle and spit).  Dispense: 100 mL; Refill: 1   Tollie EthSara E. Ravindra Baranek, NP

## 2020-02-06 NOTE — Patient Instructions (Signed)
Oral Ulcers Oral ulcers are small sores inside the mouth or near the mouth. They may occur on or inside the lips, inside the cheeks, on the tongue, or anywhere else inside or near the mouth. They may be called canker sores or cold sores, which are two types of oral ulcers. Many oral ulcers are harmless and go away on their own. In some cases, oral ulcers may require medical care to determine the cause and proper treatment. What are the causes? Common causes of this condition include:  Infections caused by viruses, bacteria, or fungi.  Emotional stress.  Foods or chemicals that irritate the mouth.  Injury or physical irritation of the mouth.  Medicines.  Allergies.  Tobacco use. Less common causes include:  Skin disease.  A type of herpes virus infection (herpes simplexor herpes zoster).  Oral cancer. In some cases, the cause may not be known. What increases the risk? You are more likely to develop this condition if:  You wear dental braces, dentures, or retainers.  You have poor oral hygiene.  You have sensitive skin.  You have a condition that affects the entire body (systemic condition), such as an immune disorder. What are the signs or symptoms? The main symptom of this condition is having one or more oval-shaped or round ulcers that have red borders. Symptoms may vary depending on the cause. This includes:  Location of the ulcers. Ulcers may be found inside the mouth, on the gums, or on the insides of the lips or cheeks. They may also be found on the lips or on skin that is near the mouth, such as the cheeks or chin.  Pain. Ulcers can be painful and uncomfortable, or they can be painless.  Appearance of the ulcers. They may look like red blisters and be filled with fluid, or they may be white or yellow patches.  Frequency of outbreaks. Ulcers may go away permanently after one outbreak, or they may come back (recur) often or rarely. How is this diagnosed? This  condition is diagnosed with a physical exam. Your health care provider may ask you questions about your lifestyle and your medical history. You may have tests, including:  Blood tests.  Removal of a small number of cells from an ulcer to be examined under a microscope (biopsy). How is this treated? Treatment depends on the severity and cause of the condition. Oral ulcers often go away on their own in 1-2 weeks. Treatment may include medicines, such as:  Medicines to treat a viral infection (antivirals), a bacterial infection (antibiotics), or a fungal infection (antifungals).  Medicines to help control pain. This may include: ? Over-the-counter pain medicines. ? Gel, cream, or spray to numb the area (topical anesthetic) if you have severe pain. ? Other medicines to coat or numb your mouth. Follow these instructions at home: Medicines  Take or use over-the-counter and prescription medicines only as told by your health care provider.  If you were prescribed an antibiotic medicine, take it as told by your health care provider. Do not stop taking the antibiotic even if you start to feel better.  Do not use products that contain benzocaine (including numbing gels) to treat teething or mouth pain in children who are younger than 2 years. These products may cause a rare but serious blood condition. Eating and drinking  Eat a balanced diet. Do not eat: ? Spicy foods. ? Citrus, such as oranges. ? Other foods and drinks that you think may cause or irritate your ulcers.    Drink enough fluid to keep your urine pale yellow.  Avoid drinking alcohol. Lifestyle   Practice good oral hygiene: ? Gently brush your teeth with a soft toothbrush two times a day. ? Floss your teeth every day. ? Get regular dental cleanings and checkups.  Do not use any products that contain nicotine or tobacco, such as cigarettes and e-cigarettes. If you need help quitting, ask your health care provider. Managing  pain  If directed, put ice on your face in the affected area to help reduce pain. ? Put ice in a plastic bag. ? Place a towel between your skin and the bag. ? Leave the ice on for 20 minutes, 2-3 times a day.  Avoid physical or chemical irritants that may have caused the ulcers or made them worse, such as mouthwashes that contain alcohol (ethanol). If you wear dental braces, dentures, or retainers, work with your health care provider to make sure these devices are fitted correctly.  If you were prescribed a prescription mouthwash to help reduce pain in your mouth, use it as told by your health care provider. General instructions  Rinse with a salt-water mixture 3-4 times a day or as told by your health care provider. To make a salt-water mixture, completely dissolve -1 tsp (3-6 g) of salt in 1 cup (237 mL) of warm water.  Keep all follow-up visits as told by your health care provider. This is important. Contact a health care provider if:  You have: ? Pain that gets worse or does not get better with medicine. ? Four or more ulcers at one time. ? A fever. ? New ulcers that look or feel different from other ulcers you have. ? Inflammation in one eye or both eyes. ? Ulcers that do not go away after 10 days.  You develop new symptoms in your mouth, such as: ? Bleeding or crusting around your lips or gums. ? Tooth pain. ? Difficulty swallowing.  You develop symptoms on your skin or genitals, such as: ? A rash or blisters. ? Burning or itching sensations.  Your ulcers begin or get worse after you start a new medicine. Get help right away if you have:  Difficulty breathing.  Swelling in your face or neck.  Excessive bleeding from your mouth.  Severe pain. Summary  Oral ulcers may occur anywhere inside or near the mouth.  They can be caused by many things, such as infections, stress, injury or irritation, or tobacco use.  Oral ulcers can be painful or painless.  Treatment  may include medicines to relieve pain or to treat an infection (if appropriate).  Most oral ulcers go away in 1-2 weeks. This information is not intended to replace advice given to you by your health care provider. Make sure you discuss any questions you have with your health care provider. Document Revised: 12/07/2017 Document Reviewed: 12/07/2017 Elsevier Patient Education  2020 [employer]  

## 2020-02-06 NOTE — Progress Notes (Signed)
Negative for intraepithelial lesion or malignancy.  

## 2020-06-09 ENCOUNTER — Encounter: Payer: Self-pay | Admitting: Family Medicine

## 2020-06-16 ENCOUNTER — Telehealth: Payer: Self-pay | Admitting: *Deleted

## 2020-06-16 NOTE — Telephone Encounter (Signed)
Returned call from 1:52 PM. Will call patient to schedule January appointment/annual with Dr. Earlene Plater after schedule comes out.

## 2020-06-17 ENCOUNTER — Other Ambulatory Visit: Payer: Self-pay | Admitting: Obstetrics and Gynecology

## 2020-06-17 ENCOUNTER — Other Ambulatory Visit: Payer: Self-pay | Admitting: Family Medicine

## 2020-06-17 DIAGNOSIS — Z1231 Encounter for screening mammogram for malignant neoplasm of breast: Secondary | ICD-10-CM

## 2020-08-13 ENCOUNTER — Ambulatory Visit: Payer: BC Managed Care – PPO | Admitting: Obstetrics and Gynecology

## 2020-08-13 ENCOUNTER — Encounter: Payer: BC Managed Care – PPO | Admitting: Family Medicine

## 2020-08-13 ENCOUNTER — Ambulatory Visit: Payer: BC Managed Care – PPO

## 2020-08-16 ENCOUNTER — Encounter: Payer: Self-pay | Admitting: Family Medicine

## 2020-08-17 ENCOUNTER — Telehealth (INDEPENDENT_AMBULATORY_CARE_PROVIDER_SITE_OTHER): Payer: BC Managed Care – PPO | Admitting: Medical-Surgical

## 2020-08-17 ENCOUNTER — Encounter: Payer: Self-pay | Admitting: Medical-Surgical

## 2020-08-17 VITALS — BP 108/63 | HR 63 | Temp 98.8°F

## 2020-08-17 DIAGNOSIS — J0100 Acute maxillary sinusitis, unspecified: Secondary | ICD-10-CM | POA: Diagnosis not present

## 2020-08-17 DIAGNOSIS — H6983 Other specified disorders of Eustachian tube, bilateral: Secondary | ICD-10-CM | POA: Diagnosis not present

## 2020-08-17 MED ORDER — AZELASTINE-FLUTICASONE 137-50 MCG/ACT NA SUSP: 1.0000 | Freq: Two times a day (BID) | NASAL | 1 refills | Status: DC | PRN

## 2020-08-17 MED ORDER — AMOXICILLIN-POT CLAVULANATE 875-125 MG PO TABS: 1.0000 | ORAL_TABLET | Freq: Two times a day (BID) | ORAL | 0 refills | Status: DC

## 2020-08-17 NOTE — Progress Notes (Signed)
Virtual Visit via Video Note  I connected with Becky Bowers on 08/17/20 at  3:00 PM EST by a video enabled telemedicine application and verified that I am speaking with the correct person using two identifiers.   I discussed the limitations of evaluation and management by telemedicine and the availability of in person appointments. The patient expressed understanding and agreed to proceed.  Patient location: home Provider locations: office  Subjective:    CC: upper respiratory s/s  HPI: Pleasant 65 year old female presenting today with reports of upper respiratory symptoms that started 1 week ago. She originally started with head cold symptoms including dry cough, sneezing, rhinorrhea, fever, and headache. Now she reports most of those symptoms have resolved but she has congestion that has consolidated in her left maxillary sinus area and in her bilateral ears. Notes that her ears do not hurt all the time but she does have pain when she blows her nose. Notes that her ears are also popping a lot. Reports feeling like her ears have cotton in them but no significant hearing loss or tinnitus. She is having dizziness/lightheadedness with quick movements of her head but this does resolve quickly. She has tried Tylenol, Coricidin, and Zicam with little relief.  Past medical history, Surgical history, Family history not pertinant except as noted below, Social history, Allergies, and medications have been entered into the medical record, reviewed, and corrections made.   Review of Systems: See HPI for pertinent positives and negatives.   Objective:    General: Speaking clearly in complete sentences without any shortness of breath.  Alert and oriented x3.  Normal judgment. No apparent acute distress.  Impression and Recommendations:    1. Acute non-recurrent maxillary sinusitis With her maxillary tenderness, we will go ahead and treat her empirically with Augmentin twice daily x7 days. Recommend  increasing p.o. fluids, using a coolmist humidifier, saline nasal spray, and Mucinex as needed.  2. Dysfunction of both eustachian tubes Discussed eustachian tube dysfunction and middle ear effusion that can last for several weeks after an upper respiratory illness. Recommend restarting azelastine-Flonase nasal spray, refill sent.  I discussed the assessment and treatment plan with the patient. The patient was provided an opportunity to ask questions and all were answered. The patient agreed with the plan and demonstrated an understanding of the instructions.   The patient was advised to call back or seek an in-person evaluation if the symptoms worsen or if the condition fails to improve as anticipated.  20 minutes of non-face-to-face time was provided during this encounter.  Return if symptoms worsen or fail to improve.  Thayer Ohm, DNP, APRN, FNP-BC [facility]

## 2020-09-03 ENCOUNTER — Other Ambulatory Visit: Payer: Self-pay | Admitting: Family Medicine

## 2020-09-03 ENCOUNTER — Encounter: Payer: Self-pay | Admitting: Family Medicine

## 2020-09-03 ENCOUNTER — Other Ambulatory Visit: Payer: Self-pay

## 2020-09-03 ENCOUNTER — Ambulatory Visit (INDEPENDENT_AMBULATORY_CARE_PROVIDER_SITE_OTHER): Payer: BC Managed Care – PPO | Admitting: Family Medicine

## 2020-09-03 ENCOUNTER — Encounter: Payer: Self-pay | Admitting: Obstetrics and Gynecology

## 2020-09-03 ENCOUNTER — Ambulatory Visit (INDEPENDENT_AMBULATORY_CARE_PROVIDER_SITE_OTHER): Payer: BC Managed Care – PPO | Admitting: Obstetrics and Gynecology

## 2020-09-03 VITALS — BP 101/49 | HR 57 | Ht 64.0 in | Wt 123.0 lb

## 2020-09-03 VITALS — BP 101/49 | HR 54 | Ht 64.0 in | Wt 123.0 lb

## 2020-09-03 DIAGNOSIS — Z23 Encounter for immunization: Secondary | ICD-10-CM | POA: Diagnosis not present

## 2020-09-03 DIAGNOSIS — Z1231 Encounter for screening mammogram for malignant neoplasm of breast: Secondary | ICD-10-CM | POA: Diagnosis not present

## 2020-09-03 DIAGNOSIS — N951 Menopausal and female climacteric states: Secondary | ICD-10-CM

## 2020-09-03 DIAGNOSIS — Z01419 Encounter for gynecological examination (general) (routine) without abnormal findings: Secondary | ICD-10-CM | POA: Diagnosis not present

## 2020-09-03 DIAGNOSIS — B009 Herpesviral infection, unspecified: Secondary | ICD-10-CM | POA: Diagnosis not present

## 2020-09-03 DIAGNOSIS — R591 Generalized enlarged lymph nodes: Secondary | ICD-10-CM | POA: Diagnosis not present

## 2020-09-03 DIAGNOSIS — Z0000 Encounter for general adult medical examination without abnormal findings: Secondary | ICD-10-CM | POA: Diagnosis not present

## 2020-09-03 LAB — CBC
HCT: 39.7 % (ref 35.0–45.0)
Hemoglobin: 13.2 g/dL (ref 11.7–15.5)
MCH: 30.7 pg (ref 27.0–33.0)
MCHC: 33.2 g/dL (ref 32.0–36.0)
MCV: 92.3 fL (ref 80.0–100.0)
MPV: 11.8 fL (ref 7.5–12.5)
Platelets: 245 10*3/uL (ref 140–400)
RBC: 4.3 10*6/uL (ref 3.80–5.10)
RDW: 12.1 % (ref 11.0–15.0)
WBC: 5.5 10*3/uL (ref 3.8–10.8)

## 2020-09-03 LAB — COMPLETE METABOLIC PANEL WITH GFR
AG Ratio: 2.4 (calc) (ref 1.0–2.5)
ALT: 9 U/L (ref 6–29)
AST: 23 U/L (ref 10–35)
Albumin: 4.7 g/dL (ref 3.6–5.1)
Alkaline phosphatase (APISO): 58 U/L (ref 37–153)
BUN: 20 mg/dL (ref 7–25)
CO2: 31 mmol/L (ref 20–32)
Calcium: 9.7 mg/dL (ref 8.6–10.4)
Chloride: 102 mmol/L (ref 98–110)
Creat: 0.63 mg/dL (ref 0.50–0.99)
GFR, Est African American: 111 mL/min/{1.73_m2} (ref >=60)
GFR, Est Non African American: 95 mL/min/{1.73_m2} (ref >=60)
Globulin: 2 g/dL (calc) (ref 1.9–3.7)
Glucose, Bld: 88 mg/dL (ref 65–99)
Potassium: 4.4 mmol/L (ref 3.5–5.3)
Sodium: 140 mmol/L (ref 135–146)
Total Bilirubin: 0.3 mg/dL (ref 0.2–1.2)
Total Protein: 6.7 g/dL (ref 6.1–8.1)

## 2020-09-03 LAB — LIPID PANEL
Cholesterol: 197 mg/dL (ref <200)
HDL: 77 mg/dL (ref >=50)
LDL Cholesterol (Calc): 104 mg/dL (calc) — ABNORMAL HIGH
Non-HDL Cholesterol (Calc): 120 mg/dL (calc) (ref <130)
Total CHOL/HDL Ratio: 2.6 (calc) (ref <5.0)
Triglycerides: 72 mg/dL (ref <150)

## 2020-09-03 MED ORDER — VALACYCLOVIR HCL 1 G PO TABS: 1000.0000 mg | ORAL_TABLET | ORAL | 3 refills | Status: DC | PRN

## 2020-09-03 MED ORDER — ESTRADIOL 10 MCG VA TABS: ORAL_TABLET | VAGINAL | 6 refills | Status: DC

## 2020-09-03 MED ORDER — CELECOXIB 200 MG PO CAPS: 200.0000 mg | ORAL_CAPSULE | Freq: Two times a day (BID) | ORAL | 0 refills | Status: DC | PRN

## 2020-09-03 NOTE — Progress Notes (Signed)
GYNECOLOGY ANNUAL PREVENTATIVE CARE ENCOUNTER NOTE  Subjective:   Becky Bowers is a 65 y.o. G77P0011 female here for a annual gynecologic exam. Current complaints: none, needs refill on estradiol.  Otherwise doing well with no complaints.  Denies abnormal vaginal bleeding, discharge, pelvic pain, problems with intercourse or other gynecologic concerns. Declines STI screen.   Gynecologic History No LMP recorded. Patient is postmenopausal. Contraception: post menopausal status Last Pap: 07/2019. Results: normal Last mammogram: 07/2019. Results: Birads 1 DEXA:1 /2021  Obstetric History OB History  Gravida Para Term Preterm AB Living  2 1     1 1   SAB IAB Ectopic Multiple Live Births  1            # Outcome Date GA Lbr Len/2nd Weight Sex Delivery Anes PTL Lv  2 SAB           1 Para             History reviewed. No pertinent past medical history.  Past Surgical History:  Procedure Laterality Date  . APPENDECTOMY  1968  . DILATION AND CURETTAGE OF UTERUS  1990  . TONSILLECTOMY  1980    Current Outpatient Medications on File Prior to Visit  Medication Sig Dispense Refill  . Azelastine-Fluticasone 137-50 MCG/ACT SUSP Place 1 Act into the nose 2 (two) times daily as needed.    . Calcium Carbonate-Vitamin D 600-200 MG-UNIT TABS Take 1 tablet by mouth 2 (two) times daily.    . celecoxib (CELEBREX) 200 MG capsule Take 1 capsule (200 mg total) by mouth 2 (two) times daily as needed. 180 capsule 0  . Estradiol 10 MCG TABS vaginal tablet 1 tablet per vagina daily 30 tablet 6  . ibandronate (BONIVA) 150 MG tablet Take 1 tablet (150 mg total) by mouth every 30 (thirty) days. Take in the morning with a full glass of water, on an empty stomach, and do not take anything else by mouth or lie down for the next 30 min. 3 tablet 4  . Multiple Vitamins-Minerals (ONE-A-DAY WOMENS 50+ ADVANTAGE PO) Take 1 capsule by mouth daily.    triamcinolone (KENALOG) 0.1 % Apply 1 application  topically 2 (two) times daily. 30 g    No current facility-administered medications on file prior to visit.    No Known Allergies  Social History   Socioeconomic History  . Marital status: Married    Spouse name: Becky Bowers.   . Number of children: 1  . Years of education: Not on file  . Highest education level: Not on file  Occupational History  . Occupation: Retired [employer]: [employer]  Tobacco Use  . Smoking status: Never Smoker  . Smokeless tobacco: Never Used  Substance and Sexual Activity  . Alcohol use: Yes    Alcohol/week: 2.0 - 4.0 standard drinks    Types: 2 - 4 Glasses of wine per week    Comment: Occ  . Drug use: No  . 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  Other Topics Concern  . Not on file  Social History Narrative   1 Caffeine drink per day. 5 days per week exercising. Retired.     Social Determinants of Health   Financial Resource Strain: Not on file  Food Insecurity: Not on file  Transportation Needs: Not on file  Physical Activity: Not  on file  Stress: Not on file  Social Connections: Not on file  Intimate Partner Violence: Not on file    Family History  Problem Relation Age of Onset  . Diabetes Father   . Hypertension Father   . Squamous cell carcinoma Father        skin   . Colon cancer Paternal Grandmother   . Alzheimer's disease Paternal Grandmother     The following portions of the patient's history were reviewed and updated as appropriate: allergies, current medications, past family history, past medical history, past social history, past surgical history and problem list.  Review of Systems Pertinent items are noted in HPI.   Objective:  BP (!) 101/49   Pulse (!) 54   Ht 5\' 4"  (1.626 m)   Wt 123 lb (55.8 kg)   BMI 21.11 kg/m  CONSTITUTIONAL:  Well-developed, well-nourished female in no acute distress.  HENT:  Normocephalic, atraumatic, External right and left ear normal. Oropharynx is clear and moist EYES: Conjunctivae and EOM are normal. Pupils are equal, round, and reactive to light. No scleral icterus.  NECK: Normal range of motion, supple, no masses.  Normal thyroid.  SKIN: Skin is warm and dry. No rash noted. Not diaphoretic. No erythema. No pallor. NEUROLOGIC: Alert and oriented to person, place, and time. Normal reflexes, muscle tone coordination. No cranial nerve deficit noted. PSYCHIATRIC: Normal mood and affect. Normal behavior. Normal judgment and thought content. CARDIOVASCULAR: Normal heart rate noted  RESPIRATORY: Effort normal, no problems with respiration noted. BREASTS: Symmetric in size. No masses, skin changes, nipple drainage, or lymphadenopathy. ABDOMEN: Soft, no distention noted.  No tenderness, rebound or guarding.  PELVIC: Normal appearing external genitalia; normal appearing vaginal mucosa and cervix.  No abnormal discharge noted.  Normal uterine size, no other palpable masses, no uterine or adnexal tenderness. MUSCULOSKELETAL: Normal range of motion. No tenderness.  No cyanosis, clubbing, or edema.  2+ distal pulses.  Exam done with chaperone present.  Assessment and Plan:   1. HSV infection Rx given, she takes prn - valACYclovir (VALTREX) 1000 MG tablet; Take 1 tablet (1,000 mg total) by mouth as needed.  Dispense: 30 tablet; Refill: 3  2. Well woman exam Reviewed h/o pap Once she is 25, will have final pap and no need for any more - healthy female exam - Rx refill on estradiol given  3. Encounter for screening mammogram for malignant neoplasm of breast UTD  Will follow up results of pap smear and manage accordingly. Encouraged improvement in diet and exercise.  COVID vaccine UTD Declines STI screen. Mammogram scheduled 2/22 Referral for colonoscopy n/a, UTD Flu vaccine UTD DEXA done last  year  Routine preventative health maintenance measures emphasized. Please refer to After Visit Summary for other counseling recommendations.     3/22, MD, [facility] Attending Center for [facility])

## 2020-09-03 NOTE — Progress Notes (Signed)
Last pap- 07/24/19- negative Last mam- 07/24/19- normal (Scheduled for 09/24/20)

## 2020-09-03 NOTE — Progress Notes (Signed)
Subjective:     Becky Bowers is a 65 y.o. female and is here for a comprehensive physical exam. The patient reports no problems. Mammo scheduled.  Walks 4 days per week and does piliates one day a week.   Reports 2 weeks ago wokeup with sore area on the right side of her neck.  She is not sure if she has a knot in the muscle but she does palpate a knot on the right side of her neck.  No fevers chills or night sweats.  She has not been sick recently.  She denies any sore throat.  Social History   Socioeconomic History  . Marital status: Married    Spouse name: Ramon Dredge.   . Number of children: 1  . Years of education: Not on file  . Highest education level: Not on file  Occupational History  . Occupation: Retired Designer, television/film set: [employer]  Tobacco Use  . Smoking status: Never Smoker  . Smokeless tobacco: Never Used  Substance and Sexual Activity  . Alcohol use: Yes    Alcohol/week: 2.0 - 4.0 standard drinks    Types: 2 - 4 Glasses of wine per week    Comment: Occ  . Drug use: No  . 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  Other Topics Concern  . Not on file  Social History Narrative   1 Caffeine drink per day. 5 days per week exercising. Retired.     Social Determinants of Health   Financial Resource Strain: Not on file  Food Insecurity: Not on file  Transportation Needs: Not on file  Physical Activity: Not on file  Stress: Not on file  Social Connections: Not on file  Intimate Partner Violence: Not on file   Health Maintenance  Topic Date Due  . MAMMOGRAM  07/23/2021  . PAP SMEAR-Modifier  07/23/2022  . COLONOSCOPY (Pts 45-25yrs Insurance coverage will need to be confirmed)  08/15/2024  . TETANUS/TDAP  09/03/2030  . INFLUENZA VACCINE  Completed  . COVID-19 Vaccine  Completed  . Hepatitis C  Screening  Completed  . HIV Screening  Completed    The following portions of the patient's history were reviewed and updated as appropriate: allergies, current medications, past family history, past medical history, past social history, past surgical history and problem list.  Review of Systems A comprehensive review of systems was negative.   Objective:    BP (!) 101/49   Pulse (!) 57   Ht 5\' 4"  (1.626 m)   Wt 123 lb (55.8 kg)   SpO2 100%   BMI 21.11 kg/m  General appearance: alert, cooperative and appears stated age Head: Normocephalic, without obvious abnormality, atraumatic Eyes: conj clear, EOMi, PEERLA Ears: normal TM's and external ear canals both ears Nose: Nares normal. Septum midline. Mucosa normal. No drainage or sinus tenderness. Throat: lips, mucosa, and tongue normal; teeth and gums normal Neck: no adenopathy, no carotid bruit, no JVD, supple, symmetrical, trachea midline and thyroid not enlarged, symmetric, no tenderness/mass/nodules Back: symmetric, no curvature. ROM normal. No CVA tenderness. Lungs: clear to auscultation bilaterally Heart: regular rate and rhythm, S1, S2 normal, no murmur, click, rub or gallop Abdomen: soft, non-tender; bowel sounds normal; no masses,  no organomegaly Extremities: extremities normal, atraumatic, no cyanosis or edema Pulses: 2+ and symmetric Skin: Skin color, texture, turgor normal. No rashes  or lesions Lymph nodes: Cervical, supraclavicular, and axillary nodes normal. and She does have a small firm approximately 1 cm lymph node just posterior to the sternocleidomastoid muscle.  Also a small but mobile soft supraclavicular lymph node on the left, it may be half a centimeter Neurologic: Alert and oriented X 3, normal strength and tone. Normal symmetric reflexes. Normal coordination and gait    Assessment:    Healthy female exam.      Plan:     See After Visit Summary for Counseling Recommendations   Keep up a regular  exercise program and make sure you are eating a healthy diet Try to eat 4 servings of dairy a day, or if you are lactose intolerant take a calcium with vitamin D daily.  Your vaccines are up to date.  Tdap vaccine updated today.  She has her GYN scheduled for later this morning.  Mammogram is scheduled for next month.  Lymphadenopathy-we will schedule for ultrasound, will check CBC as well and then will follow as needed.  Positive family history of metastatic melanoma in her father.  She will be down in the [place] part of the state on and off helping take care of her dad.

## 2020-09-03 NOTE — Addendum Note (Signed)
Addended by: Leroy Libman on: 09/03/2020 10:33 AM   Modules accepted: Orders

## 2020-09-03 NOTE — Patient Instructions (Signed)
Health Maintenance, Female Adopting a healthy lifestyle and getting preventive care are important in promoting health and wellness. Ask your health care provider about:  The right schedule for you to have regular tests and exams.  Things you can do on your own to prevent diseases and keep yourself healthy. What should I know about diet, weight, and exercise? Eat a healthy diet  Eat a diet that includes plenty of vegetables, fruits, low-fat dairy products, and lean protein.  Do not eat a lot of foods that are high in solid fats, added sugars, or sodium.   Maintain a healthy weight Body mass index (BMI) is used to identify weight problems. It estimates body fat based on height and weight. Your health care provider can help determine your BMI and help you achieve or maintain a healthy weight. Get regular exercise Get regular exercise. This is one of the most important things you can do for your health. Most adults should:  Exercise for at least 150 minutes each week. The exercise should increase your heart rate and make you sweat (moderate-intensity exercise).  Do strengthening exercises at least twice a week. This is in addition to the moderate-intensity exercise.  Spend less time sitting. Even light physical activity can be beneficial. Watch cholesterol and blood lipids Have your blood tested for lipids and cholesterol at 64 years of age, then have this test every 5 years. Have your cholesterol levels checked more often if:  Your lipid or cholesterol levels are high.  You are older than 64 years of age.  You are at high risk for heart disease. What should I know about cancer screening? Depending on your health history and family history, you may need to have cancer screening at various ages. This may include screening for:  Breast cancer.  Cervical cancer.  Colorectal cancer.  Skin cancer.  Lung cancer. What should I know about heart disease, diabetes, and high blood  pressure? Blood pressure and heart disease  High blood pressure causes heart disease and increases the risk of stroke. This is more likely to develop in people who have high blood pressure readings, are of African descent, or are overweight.  Have your blood pressure checked: ? Every 3-5 years if you are 18-39 years of age. ? Every year if you are 40 years old or older. Diabetes Have regular diabetes screenings. This checks your fasting blood sugar level. Have the screening done:  Once every three years after age 40 if you are at a normal weight and have a low risk for diabetes.  More often and at a younger age if you are overweight or have a high risk for diabetes. What should I know about preventing infection? Hepatitis B If you have a higher risk for hepatitis B, you should be screened for this virus. Talk with your health care provider to find out if you are at risk for hepatitis B infection. Hepatitis C Testing is recommended for:  Everyone born from 1945 through 1965.  Anyone with known risk factors for hepatitis C. Sexually transmitted infections (STIs)  Get screened for STIs, including gonorrhea and chlamydia, if: ? You are sexually active and are younger than 64 years of age. ? You are older than 64 years of age and your health care provider tells you that you are at risk for this type of infection. ? Your sexual activity has changed since you were last screened, and you are at increased risk for chlamydia or gonorrhea. Ask your health care provider   if you are at risk.  Ask your health care provider about whether you are at high risk for HIV. Your health care provider may recommend a prescription medicine to help prevent HIV infection. If you choose to take medicine to prevent HIV, you should first get tested for HIV. You should then be tested every 3 months for as long as you are taking the medicine. Pregnancy  If you are about to stop having your period (premenopausal) and  you may become pregnant, seek counseling before you get pregnant.  Take 400 to 800 micrograms (mcg) of folic acid every day if you become pregnant.  Ask for birth control (contraception) if you want to prevent pregnancy. Osteoporosis and menopause Osteoporosis is a disease in which the bones lose minerals and strength with aging. This can result in bone fractures. If you are 65 years old or older, or if you are at risk for osteoporosis and fractures, ask your health care provider if you should:  Be screened for bone loss.  Take a calcium or vitamin D supplement to lower your risk of fractures.  Be given hormone replacement therapy (HRT) to treat symptoms of menopause. Follow these instructions at home: Lifestyle  Do not use any products that contain nicotine or tobacco, such as cigarettes, e-cigarettes, and chewing tobacco. If you need help quitting, ask your health care provider.  Do not use street drugs.  Do not share needles.  Ask your health care provider for help if you need support or information about quitting drugs. Alcohol use  Do not drink alcohol if: ? Your health care provider tells you not to drink. ? You are pregnant, may be pregnant, or are planning to become pregnant.  If you drink alcohol: ? Limit how much you use to 0-1 drink a day. ? Limit intake if you are breastfeeding.  Be aware of how much alcohol is in your drink. In the [place] one drink equals one 12 oz bottle of beer (355 mL), one 5 oz glass of wine (148 mL), or one 1 oz glass of hard liquor (44 mL). General instructions  Schedule regular health, dental, and eye exams.  Stay current with your vaccines.  Tell your health care provider if: ? You often feel depressed. ? You have ever been abused or do not feel safe at home. Summary  Adopting a healthy lifestyle and getting preventive care are important in promoting health and wellness.  Follow your health care provider's instructions about healthy  diet, exercising, and getting tested or screened for diseases.  Follow your health care provider's instructions on monitoring your cholesterol and blood pressure. This information is not intended to replace advice given to you by your health care provider. Make sure you discuss any questions you have with your health care provider. Document Revised: 07/18/2018 Document Reviewed: 07/18/2018 Elsevier Patient Education  2021 Elsevier Inc.  

## 2020-09-07 ENCOUNTER — Ambulatory Visit (INDEPENDENT_AMBULATORY_CARE_PROVIDER_SITE_OTHER): Payer: BC Managed Care – PPO

## 2020-09-07 ENCOUNTER — Other Ambulatory Visit: Payer: Self-pay

## 2020-09-07 DIAGNOSIS — R221 Localized swelling, mass and lump, neck: Secondary | ICD-10-CM | POA: Diagnosis not present

## 2020-09-07 DIAGNOSIS — R591 Generalized enlarged lymph nodes: Secondary | ICD-10-CM

## 2020-09-24 ENCOUNTER — Ambulatory Visit (INDEPENDENT_AMBULATORY_CARE_PROVIDER_SITE_OTHER): Payer: BC Managed Care – PPO

## 2020-09-24 ENCOUNTER — Other Ambulatory Visit: Payer: Self-pay

## 2020-09-24 DIAGNOSIS — Z1231 Encounter for screening mammogram for malignant neoplasm of breast: Secondary | ICD-10-CM | POA: Diagnosis not present

## 2020-12-02 ENCOUNTER — Other Ambulatory Visit: Payer: Self-pay | Admitting: Family Medicine

## 2021-02-07 ENCOUNTER — Encounter: Payer: Self-pay | Admitting: Emergency Medicine

## 2021-02-07 ENCOUNTER — Emergency Department
Admission: EM | Admit: 2021-02-07 | Discharge: 2021-02-07 | Disposition: A | Discharge status: Home / Self Care | Payer: BC Managed Care – PPO | Source: Home / Self Care

## 2021-02-07 ENCOUNTER — Other Ambulatory Visit: Payer: Self-pay

## 2021-02-07 DIAGNOSIS — J0100 Acute maxillary sinusitis, unspecified: Secondary | ICD-10-CM | POA: Diagnosis not present

## 2021-02-07 DIAGNOSIS — J309 Allergic rhinitis, unspecified: Secondary | ICD-10-CM | POA: Diagnosis not present

## 2021-02-07 HISTORY — DX: Unspecified osteoarthritis, unspecified site: M19.90

## 2021-02-07 MED ORDER — FEXOFENADINE HCL 180 MG PO TABS: 180.0000 mg | ORAL_TABLET | Freq: Every day | ORAL | 0 refills | Status: DC

## 2021-02-07 MED ORDER — AMOXICILLIN-POT CLAVULANATE 875-125 MG PO TABS: 1.0000 | ORAL_TABLET | Freq: Two times a day (BID) | ORAL | 0 refills | Status: AC

## 2021-02-07 NOTE — Discharge Instructions (Addendum)
Advised patient to take medication as directed with food to completion.  Advised patient may take Allegra 180 mg daily for the next 3 to 4 days, then as needed.  Encourage patient increase daily water intake while taking this medication.

## 2021-02-07 NOTE — ED Provider Notes (Signed)
[name]    CSN: [id number] Arrival date & time: 02/07/21  1324      History   Chief Complaint Chief Complaint  Patient presents with  . Ear Fullness    HPI Becky Bowers is a 65 y.o. female.   HPI 64 year old female presents with bilateral ear fullness and sinus nasal congestion for 8 days.  Reports is vaccinated for COVID-19.  Past Medical History:  Diagnosis Date  . Arthritis     Patient Active Problem List   Diagnosis Date Noted  . Concussion 09/12/2019  . 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 Numbness of right third and fourth toes 03/19/2018  . Bursitis of right hip 12/08/2015  . Headache 08/12/2010  . LUMBAGO 01/07/2008    Past Surgical History:  Procedure Laterality Date  . APPENDECTOMY  1968  . DILATION AND CURETTAGE OF UTERUS  1990  . TONSILLECTOMY  1980    OB History     Gravida  2   Para  1   Term      Preterm      AB  1   Living  1      SAB  1   IAB      Ectopic      Multiple      Live Births               Home Medications    Prior to Admission medications   Medication Sig Start Date End Date Taking? Authorizing Provider  amoxicillin-clavulanate (AUGMENTIN) 875-125 MG tablet Take 1 tablet by mouth 2 (two) times daily for 5 days. 02/07/21 02/12/21 Yes Trevor Iha, FNP  fexofenadine [facility] ALLERGY) 180 MG tablet Take 1 tablet (180 mg total) by mouth daily for 15 days. 02/07/21 02/22/21 Yes Trevor Iha, FNP  Azelastine-Fluticasone 137-50 MCG/ACT SUSP Place 1 Act into the nose 2 (two) times daily as needed.    Christen Butter, NP  Calcium Carbonate-Vitamin D 600-200 MG-UNIT TABS Take 1 tablet by mouth 2 (two) times daily.    [provider]  celecoxib (CELEBREX) 200 MG capsule TAKE 1 CAPSULE(200 MG) BY MOUTH TWICE DAILY AS NEEDED 12/02/20   Agapito Games, MD  Estradiol 10 MCG TABS vaginal tablet 1 tablet per vagina daily 09/03/20   Conan Bowens,  MD  ibandronate (BONIVA) 150 MG tablet Take 1 tablet (150 mg total) by mouth every 30 (thirty) days. Take in the morning with a full glass of water, on an empty stomach, and do not take anything else by mouth or lie down for the next 30 min. 12/31/19   Agapito Games, MD  Multiple Vitamins-Minerals (ONE-A-DAY WOMENS 50+ ADVANTAGE PO) Take 1 capsule by mouth daily.    [provider]  triamcinolone (KENALOG) 0.1 % Apply 1 application topically 2 (two) times daily. 09/03/20 09/03/21  Clarisse Gouge, MD  valACYclovir (VALTREX) 1000 MG tablet Take 1 tablet (1,000 mg total) by mouth as needed. 09/03/20   Conan Bowens, MD    Family History Family History  Problem Relation Age of Onset  . Diabetes Father   . Hypertension Father   . Squamous cell carcinoma Father        skin   . Colon cancer Paternal Grandmother   . Alzheimer's disease Paternal Grandmother     Social History Social History   Tobacco Use  . Smoking status: Never  . Smokeless tobacco: Never  Substance Use Topics  .  Alcohol use: Yes    Alcohol/week: 2.0 - 4.0 standard drinks    Types: 2 - 4 Glasses of wine per week    Comment: Occ  . Drug use: No     Allergies   Patient has no known allergies.   Review of Systems Review of Systems  HENT:  Positive for congestion, ear pain and postnasal drip.   All other systems reviewed and are negative.   Physical Exam Triage Vital Signs ED Triage Vitals  Enc Vitals Group     BP 02/07/21 1441 133/83     Pulse Rate 02/07/21 1441 (!) 58     Resp 02/07/21 1441 16     Temp 02/07/21 1441 98.5 F (36.9 C)     Temp Source 02/07/21 1441 Oral     SpO2 02/07/21 1441 99 %     Weight 02/07/21 1433 125 lb (56.7 kg)     Height 02/07/21 1433 5\' 4"  (1.626 m)     Head Circumference --      Peak Flow --      Pain Score 02/07/21 1433 0     Pain Loc --      Pain Edu? --      Excl. in GC? --    No data found.  Updated Vital Signs BP 133/83 (BP Location: Right Arm)    Pulse (!) 58   Temp 98.5 F (36.9 C) (Oral)   Resp 16   Ht 5\' 4"  (1.626 m)   Wt 125 lb (56.7 kg)   SpO2 99%   BMI 21.46 kg/m    Physical Exam Vitals and nursing note reviewed.  Constitutional:      General: She is not in acute distress.    Appearance: Normal appearance. She is normal weight. She is not ill-appearing.  HENT:     Head: Normocephalic and atraumatic.     Right Ear: Tympanic membrane and ear canal normal.     Left Ear: Tympanic membrane and ear canal normal.     Nose: Nose normal.     Mouth/Throat:     Mouth: Mucous membranes are moist.     Pharynx: Oropharynx is clear.     Comments: Moderate amount of clear drainage of posterior oropharynx noted Cardiovascular:     Rate and Rhythm: Normal rate and regular rhythm.     Pulses: Normal pulses.  Pulmonary:     Effort: Pulmonary effort is normal.     Breath sounds: Normal breath sounds.     Comments: No adventitious breath sounds noted Musculoskeletal:        General: Normal range of motion.     Cervical back: Normal range of motion and neck supple. No tenderness.  Lymphadenopathy:     Cervical: No cervical adenopathy.  Skin:    General: Skin is warm and dry.  Neurological:     General: No focal deficit present.     Mental Status: She is alert and oriented to person, place, and time.  Psychiatric:        Mood and Affect: Mood normal.        Behavior: Behavior normal.     UC Treatments / Results  Labs (all labs ordered are listed, but only abnormal results are displayed) Labs Reviewed - No data to display  EKG   Radiology No results found.  Procedures Procedures (including critical care time)  Medications Ordered in UC Medications - No data to display  Initial Impression / Assessment and Plan / UC Course  I have reviewed the triage vital signs and the nursing notes.  Pertinent labs & imaging results that were available during my care of the patient were reviewed by me and considered in my  medical decision making (see chart for details).    MDM: 1.  Subacute maxillary sinusitis-Rx'd Augmentin, 2.  Allergic rhinitis Rx'd Allegra.  Patient discharged home, hemodynamically stable. Final Clinical Impressions(s) / UC Diagnoses   Final diagnoses:  Subacute maxillary sinusitis  Allergic rhinitis, unspecified seasonality, unspecified trigger     Discharge Instructions      Advised patient to take medication as directed with food to completion.  Advised patient may take Allegra 180 mg daily for the next 3 to 4 days, then as needed.  Encourage patient increase daily water intake while taking this medication.     ED Prescriptions     Medication Sig Dispense Auth. Provider   amoxicillin-clavulanate (AUGMENTIN) 875-125 MG tablet Take 1 tablet by mouth 2 (two) times daily for 5 days. 10 tablet Trevor Iha, FNP   fexofenadine [facility] ALLERGY) 180 MG tablet Take 1 tablet (180 mg total) by mouth daily for 15 days. 15 tablet Trevor Iha, FNP      PDMP not reviewed this encounter.   Trevor Iha, FNP 02/07/21 1820

## 2021-02-07 NOTE — ED Triage Notes (Signed)
Patient here with sense of fullness in both ears past 4 days; did feel that she started with a 'cold' about 2 weeks ago; noticing some mild dizziness upon change of positions. Thinks she may have a sinus infection.  Has tested herself for covid at home 8 days ago and it was negative. Has had all covid vaccinations.

## 2021-03-10 ENCOUNTER — Encounter: Payer: Self-pay | Admitting: Family Medicine

## 2021-03-10 ENCOUNTER — Ambulatory Visit: Payer: BC Managed Care – PPO | Admitting: Sports Medicine

## 2021-03-10 DIAGNOSIS — M810 Age-related osteoporosis without current pathological fracture: Secondary | ICD-10-CM

## 2021-03-11 MED ORDER — IBANDRONATE SODIUM 150 MG PO TABS: 150.0000 mg | ORAL_TABLET | ORAL | 4 refills | Status: DC

## 2021-03-17 ENCOUNTER — Ambulatory Visit (INDEPENDENT_AMBULATORY_CARE_PROVIDER_SITE_OTHER): Payer: BC Managed Care – PPO

## 2021-03-17 ENCOUNTER — Ambulatory Visit (INDEPENDENT_AMBULATORY_CARE_PROVIDER_SITE_OTHER): Payer: BC Managed Care – PPO | Admitting: Sports Medicine

## 2021-03-17 DIAGNOSIS — M1811 Unilateral primary osteoarthritis of first carpometacarpal joint, right hand: Secondary | ICD-10-CM

## 2021-03-17 DIAGNOSIS — M2550 Pain in unspecified joint: Secondary | ICD-10-CM | POA: Diagnosis not present

## 2021-03-17 MED ORDER — MELOXICAM 15 MG PO TABS: ORAL_TABLET | ORAL | 3 refills | Status: DC

## 2021-03-17 NOTE — Assessment & Plan Note (Signed)
Multiple joint aches and pains, 3 years ago she did have a mildly positive rheumatoid factor. We are to get updated rheumatoid labs, Celebrex has exhibited tachyphylaxis with switching to meloxicam.

## 2021-03-17 NOTE — Assessment & Plan Note (Signed)
This is a very pleasant 65 year old female, known right first CMC osteoarthritis, last injection was in August 2020. This is a chronic process with exacerbation and pharmacologic intervention.

## 2021-03-17 NOTE — Progress Notes (Signed)
    Procedures performed today:    Procedure: Real-time Ultrasound Guided injection of the right first [facility] Device: Samsung HS60  Verbal informed consent obtained.  Time-out conducted.  Noted no overlying erythema, induration, or other signs of local infection.  Skin prepped in a sterile fashion.  Local anesthesia: Topical Ethyl chloride.  With sterile technique and under real time ultrasound guidance: Noted arthritic joint, 1/2 cc lidocaine, 1/2 cc kenalog 40 injected easily.   Completed without difficulty  Advised to call if fevers/chills, erythema, induration, drainage, or persistent bleeding.  Images permanently stored and available for review in PACS.  Impression: Technically successful ultrasound guided injection.  Independent interpretation of notes and tests performed by another provider:   None.  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 This is a chronic process with exacerbation and pharmacologic intervention.  Polyarthralgia Multiple joint aches and pains, 3 years ago she did have a mildly positive rheumatoid factor. We are to get updated rheumatoid labs, Celebrex has exhibited tachyphylaxis with switching to meloxicam.    ___________________________________________ Ihor Austin. Benjamin Stain, M.D., ABFM., CAQSM. Primary Care and Sports Medicine [facility]  Adjunct Instructor of Family Medicine  [employer]

## 2021-03-21 LAB — COMPREHENSIVE METABOLIC PANEL
AG Ratio: 2.3 (calc) (ref 1.0–2.5)
ALT: 8 U/L (ref 6–29)
AST: 23 U/L (ref 10–35)
Albumin: 4.6 g/dL (ref 3.6–5.1)
Alkaline phosphatase (APISO): 60 U/L (ref 37–153)
BUN: 17 mg/dL (ref 7–25)
CO2: 34 mmol/L — ABNORMAL HIGH (ref 20–32)
Calcium: 9.9 mg/dL (ref 8.6–10.4)
Chloride: 102 mmol/L (ref 98–110)
Creat: 0.75 mg/dL (ref 0.50–1.05)
Globulin: 2 g/dL (calc) (ref 1.9–3.7)
Glucose, Bld: 71 mg/dL (ref 65–99)
Potassium: 4.4 mmol/L (ref 3.5–5.3)
Sodium: 140 mmol/L (ref 135–146)
Total Bilirubin: 0.4 mg/dL (ref 0.2–1.2)
Total Protein: 6.6 g/dL (ref 6.1–8.1)

## 2021-03-21 LAB — LUPUS(12) PANEL
Anti Nuclear Antibody (ANA): NEGATIVE
C3 Complement: 85 mg/dL (ref 83–193)
C4 Complement: 25 mg/dL (ref 15–57)
ENA SM Ab Ser-aCnc: <1 AI
Rheumatoid fact SerPl-aCnc: 17 IU/mL — ABNORMAL HIGH (ref <14)
Ribosomal P Protein Ab: <1 AI
SM/RNP: <1 AI
SSA (Ro) (ENA) Antibody, IgG: <1 AI
SSB (La) (ENA) Antibody, IgG: <1 AI
Scleroderma (Scl-70) (ENA) Antibody, IgG: <1 AI
Thyroperoxidase Ab SerPl-aCnc: 1 IU/mL (ref <9)
ds DNA Ab: <1 IU/mL

## 2021-03-21 LAB — CBC WITH DIFFERENTIAL/PLATELET
Absolute Monocytes: 543 cells/uL (ref 200–950)
Basophils Absolute: 43 cells/uL (ref 0–200)
Basophils Relative: 0.7 %
Eosinophils Absolute: 92 cells/uL (ref 15–500)
Eosinophils Relative: 1.5 %
HCT: 38.8 % (ref 35.0–45.0)
Hemoglobin: 12.8 g/dL (ref 11.7–15.5)
Lymphs Abs: 1739 cells/uL (ref 850–3900)
MCH: 30.6 pg (ref 27.0–33.0)
MCHC: 33 g/dL (ref 32.0–36.0)
MCV: 92.8 fL (ref 80.0–100.0)
MPV: 12.5 fL (ref 7.5–12.5)
Monocytes Relative: 8.9 %
Neutro Abs: 3684 cells/uL (ref 1500–7800)
Neutrophils Relative %: 60.4 %
Platelets: 189 10*3/uL (ref 140–400)
RBC: 4.18 10*6/uL (ref 3.80–5.10)
RDW: 12.3 % (ref 11.0–15.0)
Total Lymphocyte: 28.5 %
WBC: 6.1 10*3/uL (ref 3.8–10.8)

## 2021-03-21 LAB — URIC ACID: Uric Acid, Serum: 3.9 mg/dL (ref 2.5–7.0)

## 2021-03-21 LAB — RHEUMATOID FACTOR (IGA, IGG, IGM)
Rheumatoid Factor (IgA): <5 U (ref <=6)
Rheumatoid Factor (IgG): 6 U (ref <=6)
Rheumatoid Factor (IgM): 20 U — ABNORMAL HIGH (ref <=6)

## 2021-03-21 LAB — SEDIMENTATION RATE: Sed Rate: 2 mm/h (ref 0–30)

## 2021-03-21 LAB — CK: Total CK: 127 U/L (ref 29–143)

## 2021-03-21 LAB — CYCLIC CITRUL PEPTIDE ANTIBODY, IGG: Cyclic Citrullin Peptide Ab: <16 UNITS

## 2021-04-14 ENCOUNTER — Encounter: Payer: Self-pay | Admitting: Family Medicine

## 2021-04-14 DIAGNOSIS — E7800 Pure hypercholesterolemia, unspecified: Secondary | ICD-10-CM | POA: Insufficient documentation

## 2021-04-28 ENCOUNTER — Encounter: Payer: Self-pay | Admitting: Family Medicine

## 2021-04-29 ENCOUNTER — Ambulatory Visit: Payer: BC Managed Care – PPO | Admitting: Sports Medicine

## 2021-05-04 ENCOUNTER — Telehealth (INDEPENDENT_AMBULATORY_CARE_PROVIDER_SITE_OTHER): Payer: BC Managed Care – PPO | Admitting: Family Medicine

## 2021-05-04 ENCOUNTER — Encounter: Payer: Self-pay | Admitting: Family Medicine

## 2021-05-04 DIAGNOSIS — U071 COVID-19: Secondary | ICD-10-CM | POA: Diagnosis not present

## 2021-05-04 DIAGNOSIS — H6983 Other specified disorders of Eustachian tube, bilateral: Secondary | ICD-10-CM

## 2021-05-04 MED ORDER — PREDNISONE 20 MG PO TABS: 40.0000 mg | ORAL_TABLET | Freq: Every day | ORAL | 0 refills | Status: AC

## 2021-05-04 NOTE — Progress Notes (Signed)
Virtual Video Visit via MyChart Note  I connected with  Becky Bowers on 05/04/21 at  8:10 AM EDT by the video enabled telemedicine application for MyChart, and verified that I am speaking with the correct person using two identifiers.   I introduced myself as a Publishing rights manager with the practice. We discussed the limitations of evaluation and management by telemedicine and the availability of in person appointments. The patient expressed understanding and agreed to proceed.  Participating parties in this visit include: The patient and the nurse practitioner listed.  The patient is: At home I am: In the office - Primary Care Becky Bowers  Subjective:    CC:  Chief Complaint  Patient presents with   Covid Positive   ear pressure    HPI: Becky Bowers is a 65 y.o. year old female presenting today via MyChart today for ear pressure and + COVID test.  Patient started feeling bad after traveling back from [place]. Last Wednesday she tested positive for COVID via home test. States she was having cold like symptoms without fevers. Reports most of her symptoms have improved except for her ear pressure. Reports both ears feel completely stopped up, and it is causing her hearing to sound muffled and affecting her equilibrium at times. She sent a MyChart message last week and was instructed to start an OTC decongestant. She reports she has not had any significant improvement after 4-5 days of this. She denies any ear pain or drainage, fevers.    Past medical history, Surgical history, Family history not pertinant except as noted below, Social history, Allergies, and medications have been entered into the medical record, reviewed, and corrections made.   Review of Systems:  All review of systems negative except what is listed in the HPI   Objective:    General:  Speaking clearly in complete sentences. Absent shortness of breath noted.   Alert and oriented x3.   Normal judgment.   Absent acute distress.   Impression and Recommendations:    1. COVID-19 - predniSONE (DELTASONE) 20 MG tablet; Take 2 tablets (40 mg total) by mouth daily with breakfast for 5 days.  Dispense: 10 tablet; Refill: 0  2. Dysfunction of both eustachian tubes - predniSONE (DELTASONE) 20 MG tablet; Take 2 tablets (40 mg total) by mouth daily with breakfast for 5 days.  Dispense: 10 tablet; Refill: 0  No improvement with 4-5 days of OTC decongestant. Ordering short burst of prednisone. Recommend she start the Dymista nasal spray BID for at least the next 5-7 days and continue OTC decongestant like Sudafed. Encouraged steam showers, humidifier use, OTC tylenol/ibuprofen if needed. If no improvement by next week, come in for exam. Patient aware of signs/symptoms requiring further/urgent evaluation.    Follow-up if symptoms worsen or fail to improve.    I discussed the assessment and treatment plan with the patient. The patient was provided an opportunity to ask questions and all were answered. The patient agreed with the plan and demonstrated an understanding of the instructions.   The patient was advised to call back or seek an in-person evaluation if the symptoms worsen or if the condition fails to improve as anticipated.  I spent 20 minutes dedicated to the care of this patient on the date of this encounter to include pre-visit chart review of prior notes and results, face-to-face time with the patient, and post-visit ordering of testing as indicated.   Clayborne Dana, NP

## 2021-05-20 ENCOUNTER — Other Ambulatory Visit: Payer: Self-pay

## 2021-05-20 ENCOUNTER — Ambulatory Visit: Payer: BC Managed Care – PPO | Admitting: Sports Medicine

## 2021-05-20 DIAGNOSIS — M1811 Unilateral primary osteoarthritis of first carpometacarpal joint, right hand: Secondary | ICD-10-CM | POA: Diagnosis not present

## 2021-05-20 NOTE — Assessment & Plan Note (Signed)
Much better after injection back in August of this year, previous injection lasted a year, rheumatoid work-up essentially negative, minimally positive rheumatoid factor but no overt synovitis and arthralgias in a distribution more consistent with osteoarthritis, meloxicam has helped. Return as needed.

## 2021-05-20 NOTE — Progress Notes (Signed)
    Procedures performed today:    None.  Independent interpretation of notes and tests performed by another provider:   None.  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 Return as needed.    ___________________________________________ Ihor Austin. Benjamin Stain, M.D., ABFM., CAQSM. Primary Care and Sports Medicine [facility]  Adjunct Instructor of Family Medicine  [employer]

## 2021-05-21 ENCOUNTER — Encounter: Payer: Self-pay | Admitting: Family Medicine

## 2021-07-20 ENCOUNTER — Encounter: Payer: Self-pay | Admitting: Family Medicine

## 2021-08-18 ENCOUNTER — Other Ambulatory Visit: Payer: Self-pay | Admitting: Radiology

## 2021-09-02 ENCOUNTER — Encounter: Payer: Self-pay | Admitting: Sports Medicine

## 2021-09-06 ENCOUNTER — Ambulatory Visit (INDEPENDENT_AMBULATORY_CARE_PROVIDER_SITE_OTHER): Payer: BC Managed Care – PPO | Admitting: Obstetrics & Gynecology

## 2021-09-06 ENCOUNTER — Encounter: Payer: Self-pay | Admitting: Family Medicine

## 2021-09-06 ENCOUNTER — Other Ambulatory Visit: Payer: Self-pay

## 2021-09-06 ENCOUNTER — Other Ambulatory Visit (HOSPITAL_COMMUNITY)
Admission: RE | Admit: 2021-09-06 | Discharge: 2021-09-06 | Disposition: A | Discharge status: Home / Self Care | Payer: BC Managed Care – PPO | Source: Ambulatory Visit | Attending: Obstetrics & Gynecology | Admitting: Obstetrics & Gynecology

## 2021-09-06 ENCOUNTER — Encounter: Payer: Self-pay | Admitting: Obstetrics & Gynecology

## 2021-09-06 ENCOUNTER — Ambulatory Visit (INDEPENDENT_AMBULATORY_CARE_PROVIDER_SITE_OTHER): Payer: BC Managed Care – PPO | Admitting: Family Medicine

## 2021-09-06 VITALS — BP 115/67 | HR 90 | Ht 64.0 in | Wt 127.0 lb

## 2021-09-06 VITALS — BP 115/67 | HR 90 | Resp 16 | Ht 64.0 in | Wt 127.0 lb

## 2021-09-06 DIAGNOSIS — Z0000 Encounter for general adult medical examination without abnormal findings: Secondary | ICD-10-CM

## 2021-09-06 DIAGNOSIS — H6983 Other specified disorders of Eustachian tube, bilateral: Secondary | ICD-10-CM | POA: Diagnosis not present

## 2021-09-06 DIAGNOSIS — Z01419 Encounter for gynecological examination (general) (routine) without abnormal findings: Secondary | ICD-10-CM | POA: Diagnosis present

## 2021-09-06 DIAGNOSIS — N951 Menopausal and female climacteric states: Secondary | ICD-10-CM

## 2021-09-06 MED ORDER — ESTRADIOL 10 MCG VA TABS: ORAL_TABLET | VAGINAL | 6 refills | Status: DC

## 2021-09-06 NOTE — Progress Notes (Signed)
Subjective:     Becky Bowers is a 66 y.o. female and is here for a comprehensive physical exam. The patient reports no problems.  She does exercise regularly.  She walks 3 miles a day 5 to 6 days/week.  She also does Pilates 1 day a week.  She has had some trouble with her ears popping and some fullness on and off ever since she had COVID and just has not completely gone away she does not feel like she is had any major hearing changes.  She has had her COVID booster in December.  She also reports that her colonoscopy is up-to-date she had a 5-year recall last time so she had a repeat in 2021.  She is able to go 10 years this time.  Social History   Socioeconomic History   Marital status: Married    Spouse name: Ramon Dredge.    Number of children: 1   Years of education: Not on file   Highest education level: Not on file  Occupational History   Occupation: Retired Designer, television/film set: [employer]  Tobacco Use   Smoking status: Never   Smokeless tobacco: Never  Vaping Use   Vaping Use: Never used  Substance and Sexual Activity   Alcohol use: Yes    Alcohol/week: 2.0 - 4.0 standard drinks    Types: 2 - 4 Glasses of wine per week    Comment: 4-6 glasses of wine a week   Drug use: No   Sexual activity: Yes    Birth control/protection: Post-menopausal    Comment: retired Runner, 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  Other Topics Concern   Not on file  Social History Narrative   1 Caffeine drink per day. 5 days per week exercising/walk 1 day yoga. Retired.     Social Determinants of Health   Financial Resource Strain: Not on file  Food Insecurity: Not on file  Transportation Needs: Not on file  Physical Activity: Not on file  Stress: Not on file  Social Connections: Not on file  Intimate Partner Violence: Not on file   Health Maintenance  Topic Date Due   COVID-19 Vaccine (4 - Booster for [employer]  series) 09/07/2021   PAP SMEAR-Modifier  07/23/2022   MAMMOGRAM  09/24/2022   COLONOSCOPY (Pts 45-53yrs Insurance coverage will need to be confirmed)  08/15/2029   TETANUS/TDAP  09/03/2030   INFLUENZA VACCINE  Completed   Hepatitis C Screening  Completed   HIV Screening  Completed   Zoster Vaccines- Shingrix  Completed   HPV VACCINES  Aged Out    The following portions of the patient's history were reviewed and updated as appropriate: allergies, current medications, past family history, past medical history, past social history, past surgical history, and problem list.  Review of Systems Pertinent items are noted in HPI.   Objective:    BP 115/67    Pulse 90    Resp 16    Ht 5\' 4"  (1.626 m)    Wt 127 lb (57.6 kg)    SpO2 100%    BMI 21.80 kg/m  General appearance: alert, cooperative, and appears stated age Head: Normocephalic, without obvious abnormality, atraumatic Eyes:  conj clear, eOMI, PEERLA Ears: normal TM's and external ear canals both ears Nose: Nares normal. Septum midline. Mucosa normal. No drainage or sinus tenderness. Throat: lips, mucosa, and tongue normal; teeth and gums normal Neck: no adenopathy, no carotid bruit, no  JVD, supple, symmetrical, trachea midline, and thyroid not enlarged, symmetric, no tenderness/mass/nodules Back: symmetric, no curvature. ROM normal. No CVA tenderness. Lungs: clear to auscultation bilaterally Heart: regular rate and rhythm, S1, S2 normal, no murmur, click, rub or gallop Abdomen: soft, non-tender; bowel sounds normal; no masses,  no organomegaly Extremities: extremities normal, atraumatic, no cyanosis or edema Pulses: 2+ and symmetric Skin: Skin color, texture, turgor normal. No rashes or lesions Lymph nodes: Cervical adenopathy: nl and Supraclavicular adenopathy: nl Neurologic: Grossly normal    Assessment:    Healthy female exam.      Plan:     See After Visit Summary for Counseling Recommendations  Keep up a regular  exercise program and make sure you are eating a healthy diet Try to eat 4 servings of dairy a day, or if you are lactose intolerant take a calcium with vitamin D daily.  Your vaccines are up to date.   Eustachian tube dysfunction-discussed trial of a nasal steroid spray for 2 weeks or an oral antihistamine or possibly another round of prednisone.  She actually has some nasal spray at home and so she will start with that first.  We could always consider ENT referral if its not improving.  No orders of the defined types were placed in this encounter.  Orders Placed This Encounter  Procedures   Lipid Panel w/reflex Direct LDL   COMPLETE METABOLIC PANEL WITH GFR   CBC   HM COLONOSCOPY    This external order was created through the Results Console.

## 2021-09-06 NOTE — Progress Notes (Signed)
GYNECOLOGY ANNUAL PREVENTATIVE CARE ENCOUNTER NOTE  History:     Becky Bowers is a 66 y.o. G50P0011 female here for a routine annual gynecologic exam.  Current complaints: no significant concerns. Desires refill of her vaginal estradiol.  Denies abnormal vaginal bleeding, discharge, pelvic pain, problems with intercourse or other gynecologic concerns.    Gynecologic History No LMP recorded. Patient is postmenopausal. Last Pap: 07/24/2019. Result was normal with negative HPV Last Mammogram: 09/24/20.  Result was normal Last Colonoscopy: 2021 as per patient.  Result was normal  Obstetric History OB History  Gravida Para Term Preterm AB Living  2 1     1 1   SAB IAB Ectopic Multiple Live Births  1            # Outcome Date GA Lbr Len/2nd Weight Sex Delivery Anes PTL Lv  2 SAB           1 Para             Past Medical History:  Diagnosis Date   Arthritis     Past Surgical History:  Procedure Laterality Date   APPENDECTOMY  1968   DILATION AND CURETTAGE OF UTERUS  1990   TONSILLECTOMY  1980    Current Outpatient Medications on File Prior to Visit  Medication Sig Dispense Refill   Azelastine-Fluticasone 137-50 MCG/ACT SUSP Place 1 Act into the nose 2 (two) times daily as needed.     Calcium Carbonate-Vitamin D 600-200 MG-UNIT TABS Take 1 tablet by mouth 2 (two) times daily.     ibandronate (BONIVA) 150 MG tablet Take 1 tablet (150 mg total) by mouth every 30 (thirty) days. Take in the morning with a full glass of water, on an empty stomach, and do not take anything else by mouth or lie down for the next 30 min. 3 tablet 4   meloxicam (MOBIC) 15 MG tablet 1/2 to 1 tab PO qAM with a meal for 2 weeks, then daily prn pain. 30 tablet 3   Multiple Vitamins-Minerals (ONE-A-DAY WOMENS 50+ ADVANTAGE PO) Take 1 capsule by mouth daily.     valACYclovir (VALTREX) 1000 MG tablet Take 1 tablet (1,000 mg total) by mouth as needed. 30 tablet 3   No current facility-administered  medications on file prior to visit.    No Known Allergies  Social History:  reports that she has never smoked. She has never used smokeless tobacco. She reports current alcohol use of about 2.0 - 4.0 standard drinks per week. She reports that she does not use drugs.  Family History  Problem Relation Age of Onset   Heart attack Mother    Diabetes Father    Hypertension Father    Squamous cell carcinoma Father        skin    Atrial fibrillation Father    Diabetes Brother    Hypertension Brother    Colon cancer Paternal Grandmother    Alzheimer's disease Paternal Grandmother     The following portions of the patient's history were reviewed and updated as appropriate: allergies, current medications, past family history, past medical history, past social history, past surgical history and problem list.  Review of Systems Pertinent items noted in HPI and remainder of comprehensive ROS otherwise negative.  Physical Exam:  BP 115/67    Pulse 90    Ht 5\' 4"  (1.626 m)    Wt 127 lb (57.6 kg)    BMI 21.80 kg/m  CONSTITUTIONAL: Well-developed, well-nourished female in no acute  distress.  HENT:  Normocephalic, atraumatic, External right and left ear normal.  EYES: Conjunctivae and EOM are normal. Pupils are equal, round, and reactive to light. No scleral icterus.  NECK: Normal range of motion, supple, no masses.  Normal thyroid.  SKIN: Skin is warm and dry. No rash noted. Not diaphoretic. No erythema. No pallor. MUSCULOSKELETAL: Normal range of motion. No tenderness.  No cyanosis, clubbing, or edema. NEUROLOGIC: Alert and oriented to person, place, and time. Normal reflexes, muscle tone coordination.  PSYCHIATRIC: Normal mood and affect. Normal behavior. Normal judgment and thought content. CARDIOVASCULAR: Normal heart rate noted, regular rhythm RESPIRATORY: Clear to auscultation bilaterally. Effort and breath sounds normal, no problems with respiration noted. BREASTS: Symmetric in size. No  masses, tenderness, skin changes, nipple drainage, or lymphadenopathy bilaterally. Performed in the presence of a chaperone. ABDOMEN: Soft, no distention noted.  No tenderness, rebound or guarding.  PELVIC: Atrophic external genitalia and urethral meatus; vaginal mucosa and cervix.  No abnormal vaginal discharge noted.  Pap smear obtained.  Normal uterine size, no other palpable masses, no uterine or adnexal tenderness.  Performed in the presence of a chaperone.   Assessment and Plan:    1. Well woman exam with routine gynecological exam - Cytology - PAP Will follow up results of pap smear and manage accordingly.  2. Hot flashes, menopausal Estradiol refilled - Estradiol 10 MCG TABS vaginal tablet; 1 tablet per vagina daily  Dispense: 30 tablet; Refill: 6  Mammogram scheduled already. Colon cancer screening is up to date. Routine preventative health maintenance measures emphasized. Please refer to After Visit Summary for other counseling recommendations.      Jaynie Collins, MD, FACOG Obstetrician & Gynecologist, [facility] [employer], [facility]

## 2021-09-07 LAB — CBC
HCT: 40.9 % (ref 35.0–45.0)
Hemoglobin: 13.7 g/dL (ref 11.7–15.5)
MCH: 30.8 pg (ref 27.0–33.0)
MCHC: 33.5 g/dL (ref 32.0–36.0)
MCV: 91.9 fL (ref 80.0–100.0)
MPV: 12.3 fL (ref 7.5–12.5)
Platelets: 176 10*3/uL (ref 140–400)
RBC: 4.45 10*6/uL (ref 3.80–5.10)
RDW: 12 % (ref 11.0–15.0)
WBC: 5.1 10*3/uL (ref 3.8–10.8)

## 2021-09-07 LAB — COMPLETE METABOLIC PANEL WITH GFR
AG Ratio: 2 (calc) (ref 1.0–2.5)
ALT: 8 U/L (ref 6–29)
AST: 23 U/L (ref 10–35)
Albumin: 4.6 g/dL (ref 3.6–5.1)
Alkaline phosphatase (APISO): 48 U/L (ref 37–153)
BUN: 24 mg/dL (ref 7–25)
CO2: 32 mmol/L (ref 20–32)
Calcium: 10.4 mg/dL (ref 8.6–10.4)
Chloride: 101 mmol/L (ref 98–110)
Creat: 0.93 mg/dL (ref 0.50–1.05)
Globulin: 2.3 g/dL (calc) (ref 1.9–3.7)
Glucose, Bld: 92 mg/dL (ref 65–99)
Potassium: 4.9 mmol/L (ref 3.5–5.3)
Sodium: 140 mmol/L (ref 135–146)
Total Bilirubin: 0.6 mg/dL (ref 0.2–1.2)
Total Protein: 6.9 g/dL (ref 6.1–8.1)
eGFR: 69 mL/min/{1.73_m2} (ref >=60)

## 2021-09-07 LAB — LIPID PANEL W/REFLEX DIRECT LDL
Cholesterol: 212 mg/dL — ABNORMAL HIGH (ref <200)
HDL: 90 mg/dL (ref >=50)
LDL Cholesterol (Calc): 104 mg/dL (calc) — ABNORMAL HIGH
Non-HDL Cholesterol (Calc): 122 mg/dL (calc) (ref <130)
Total CHOL/HDL Ratio: 2.4 (calc) (ref <5.0)
Triglycerides: 85 mg/dL (ref <150)

## 2021-09-07 LAB — CYTOLOGY - PAP
Comment: NEGATIVE
Diagnosis: NEGATIVE
High risk HPV: NEGATIVE

## 2021-09-08 NOTE — Progress Notes (Signed)
Hi Becky Bowers, LDL cholesterol is up just a bit similar to last year.  Otherwise though you have a great HDL level.  Your metabolic panel and blood count look great.

## 2021-09-10 ENCOUNTER — Encounter: Payer: Self-pay | Admitting: Family Medicine

## 2021-09-10 NOTE — Telephone Encounter (Signed)
Abstracted

## 2021-09-10 NOTE — Telephone Encounter (Signed)
Please request records for her colonoscopy per note below.

## 2021-09-29 ENCOUNTER — Ambulatory Visit (INDEPENDENT_AMBULATORY_CARE_PROVIDER_SITE_OTHER): Payer: BC Managed Care – PPO

## 2021-09-29 ENCOUNTER — Other Ambulatory Visit: Payer: Self-pay

## 2021-09-29 DIAGNOSIS — Z1231 Encounter for screening mammogram for malignant neoplasm of breast: Secondary | ICD-10-CM

## 2021-09-29 NOTE — Progress Notes (Signed)
Please call patient. Normal mammogram.  Repeat in 1 year.  

## 2021-10-06 DIAGNOSIS — Z1231 Encounter for screening mammogram for malignant neoplasm of breast: Secondary | ICD-10-CM

## 2021-12-14 ENCOUNTER — Ambulatory Visit: Payer: Medicare PPO | Admitting: Sports Medicine

## 2021-12-14 ENCOUNTER — Ambulatory Visit (INDEPENDENT_AMBULATORY_CARE_PROVIDER_SITE_OTHER): Payer: Medicare PPO

## 2021-12-14 DIAGNOSIS — M1811 Unilateral primary osteoarthritis of first carpometacarpal joint, right hand: Secondary | ICD-10-CM

## 2021-12-14 NOTE — Progress Notes (Signed)
? ? ?  Procedures performed today:   ? ?Procedure: Real-time Ultrasound Guided injection of the right first [facility] ?Device: Samsung HS60  ?Verbal informed consent obtained.  ?Time-out conducted.  ?Noted no overlying erythema, induration, or other signs of local infection.  ?Skin prepped in a sterile fashion.  ?Local anesthesia: Topical Ethyl chloride.  ?With sterile technique and under real time ultrasound guidance: Noted arthritic joint, 1/2 cc lidocaine, 1/2 cc kenalog 40 injected easily.   ?Completed without difficulty  ?Advised to call if fevers/chills, erythema, induration, drainage, or persistent bleeding.  ?Images permanently stored and available for review in PACS.  ?Impression: Technically successful ultrasound guided injection. ? ?Procedure: Real-time Ultrasound Guided injection of the right second MCP ?Device: Samsung HS60  ?Verbal informed consent obtained.  ?Time-out conducted.  ?Noted no overlying erythema, induration, or other signs of local infection.  ?Skin prepped in a sterile fashion.  ?Local anesthesia: Topical Ethyl chloride.  ?With sterile technique and under real time ultrasound guidance: Noted arthritic joint, 1/2 cc lidocaine, 1/2 cc kenalog 40 injected easily.   ?Completed without difficulty  ?Advised to call if fevers/chills, erythema, induration, drainage, or persistent bleeding.  ?Images permanently stored and available for review in PACS.  ?Impression: Technically successful ultrasound guided injection. ? ?Independent interpretation of notes and tests performed by another provider:  ? ?None. ? ?Brief History, Exam, Impression, and Recommendations:   ? ?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 ?Pain right first [place], also right second MCP, these were both injected today as she is going to [place] on Thursday. ?Last injection was in August 2022. ?Return to see me as needed. ? ?Chronic process with exacerbation and pharmacologic  intervention ? ?___________________________________________ ?Gwen Her. Dianah Field, M.D., ABFM., CAQSM. ?Primary Care and Sports Medicine ?[place] ? ?Adjunct Instructor of Family Medicine  ?[employer] ?

## 2021-12-14 NOTE — Assessment & Plan Note (Signed)
Pain right first [place], also right second MCP, these were both injected today as she is going to [place] on Thursday. ?Last injection was in August 2022. ?Return to see me as needed. ? ?

## 2022-02-03 ENCOUNTER — Encounter: Payer: Self-pay | Admitting: Family Medicine

## 2022-03-30 ENCOUNTER — Encounter: Payer: Self-pay | Admitting: General Practice

## 2022-04-15 DIAGNOSIS — F4320 Adjustment disorder, unspecified: Secondary | ICD-10-CM | POA: Diagnosis not present

## 2022-04-20 ENCOUNTER — Encounter: Payer: Self-pay | Admitting: Family Medicine

## 2022-04-29 DIAGNOSIS — F4320 Adjustment disorder, unspecified: Secondary | ICD-10-CM | POA: Diagnosis not present

## 2022-05-05 ENCOUNTER — Ambulatory Visit (INDEPENDENT_AMBULATORY_CARE_PROVIDER_SITE_OTHER): Payer: Medicare PPO | Admitting: Family Medicine

## 2022-05-05 ENCOUNTER — Encounter: Payer: Self-pay | Admitting: Family Medicine

## 2022-05-05 VITALS — BP 115/70 | HR 46 | Ht 64.0 in | Wt 129.0 lb

## 2022-05-05 DIAGNOSIS — Z0000 Encounter for general adult medical examination without abnormal findings: Secondary | ICD-10-CM

## 2022-05-05 DIAGNOSIS — M810 Age-related osteoporosis without current pathological fracture: Secondary | ICD-10-CM

## 2022-05-05 NOTE — Patient Instructions (Signed)
  Ms. Winborne , Thank you for taking time to come for your Medicare Wellness Visit. I appreciate your ongoing commitment to your health goals. Please review the following plan we discussed and let me know if I can assist you in the future.   These are the goals we discussed:  Goals      Exercise 150 min/wk Moderate Activity     Keep up the great work on exercise!!!         This is a list of the screening recommended for you and due dates:  Health Maintenance  Topic Date Due   COVID-19 Vaccine (4 - Booster for [employer] series) 05/09/2022*   Flu Shot  05/10/2022*   Mammogram  09/30/2023   Pap Smear  09/06/2024   Colon Cancer Screening  08/15/2029   Tetanus Vaccine  09/03/2030   Pneumonia Vaccine  Completed   DEXA scan (bone density measurement)  Completed   Hepatitis C Screening: USPSTF Recommendation to screen - Ages 39-79 yo.  Completed   HIV Screening  Completed   Zoster (Shingles) Vaccine  Completed   HPV Vaccine  Aged Out  *Topic was postponed. The date shown is not the original due date.

## 2022-05-05 NOTE — Progress Notes (Addendum)
Subjective:    Becky Bowers is a 66 y.o. female who presents for a Welcome to Medicare exam.   Review of Systems ROS is neg        Objective:    Today's Vitals   05/05/22 1344  BP: 115/70  Pulse: (!) 46  SpO2: 100%  Weight: 129 lb (58.5 kg)  Height: 5\' 4"  (1.626 m)  Body mass index is 22.14 kg/m.  Medications Outpatient Encounter Medications as of 05/05/2022  Medication Sig  . Azelastine-Fluticasone 137-50 MCG/ACT SUSP Place 1 Act into the nose 2 (two) times daily as needed.  . Calcium Carbonate-Vitamin D 600-200 MG-UNIT TABS Take 1 tablet by mouth 2 (two) times daily.  . Estradiol 10 MCG TABS vaginal tablet 1 tablet per vagina daily  . ibandronate (BONIVA) 150 MG tablet Take 1 tablet (150 mg total) by mouth every 30 (thirty) days. Take in the morning with a full glass of water, on an empty stomach, and do not take anything else by mouth or lie down for the next 30 min.  . meloxicam (MOBIC) 15 MG tablet 1/2 to 1 tab PO qAM with a meal for 2 weeks, then daily prn pain.  . Multiple Vitamins-Minerals (ONE-A-DAY WOMENS 50+ ADVANTAGE PO) Take 1 capsule by mouth daily.  . valACYclovir (VALTREX) 1000 MG tablet Take 1 tablet (1,000 mg total) by mouth as needed.   No facility-administered encounter medications on file as of 05/05/2022.     History: Past Medical History:  Diagnosis Date  . Arthritis    Past Surgical History:  Procedure Laterality Date  . APPENDECTOMY  1968  . DILATION AND CURETTAGE OF UTERUS  1990  . TONSILLECTOMY  1980    Family History  Problem Relation Age of Onset  . Heart attack Mother   . Diabetes Father   . Hypertension Father   . Squamous cell carcinoma Father        skin   . Atrial fibrillation Father   . Diabetes Brother   . Hypertension Brother   . Colon cancer Paternal Grandmother   . Alzheimer's disease Paternal Grandmother    Social History   Occupational History  . Occupation: Retired 05/07/2022: [employer]  Tobacco Use  . Smoking status: Never  . Smokeless tobacco: Never  Vaping Use  . Vaping Use: Never used  Substance and Sexual Activity  . Alcohol use: Yes    Alcohol/week: 2.0 - 4.0 standard drinks of alcohol    Types: 2 - 4 Glasses of wine per week    Comment: 4-6 glasses of wine a week  . Drug use: No  . 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    Tobacco Counseling Counseling given: Not Answered   Immunizations and Health Maintenance Immunization History  Administered Date(s) Administered  . Influenza Split 05/08/2012  . Influenza,inj,Quad PF,6+ Mos 05/09/2014  . Influenza-Unspecified 05/15/2015, 05/24/2016, 05/12/2017, 05/11/2018, 05/15/2019, 05/04/2020, 05/21/2021  . Janssen (J&J) SARS-COV-2 Vaccination 10/11/2019  . Moderna SARS-COV2 Booster Vaccination 07/13/2021  . PFIZER Comirnaty(Gray Top)Covid-19 Tri-Sucrose Vaccine 01/12/2021  . PFIZER(Purple Top)SARS-COV-2 Vaccination 06/04/2020  . PNEUMOCOCCAL CONJUGATE-20 02/03/2022  . Rsv, Bivalent, Protein Subunit Rsvpref,pf 02/05/2022) 04/19/2022  . Td 02/18/2009  . Tdap 02/06/2011, 09/03/2020  . Zoster Recombinat (Shingrix) 07/13/2017, 10/23/2017   There are no preventive care reminders to display for this patient.  Activities of Daily Living    05/05/2022    2:18 PM 05/05/2022    1:43 PM  In your present state of health, do you have any difficulty performing the following activities:  Hearing? 0 0  Vision? 0 0  Difficulty concentrating or making decisions? 0 0  Walking or climbing stairs? 0 0  Dressing or bathing? 0 0  Doing errands, shopping? 0 0  Preparing Food and eating ? N   Using the Toilet? N   In the past six months, have you accidently leaked urine? N   Do you have problems with loss of bowel control? N   Managing your Medications? N    Managing your Finances? N   Housekeeping or managing your Housekeeping? N     Physical Exam  Physical Exam Vitals and nursing note reviewed.  Constitutional:      Appearance: She is well-developed.  HENT:     Head: Normocephalic and atraumatic.  Cardiovascular:     Rate and Rhythm: Normal rate and regular rhythm.     Heart sounds: Normal heart sounds.  Pulmonary:     Effort: Pulmonary effort is normal.     Breath sounds: Normal breath sounds.  Skin:    General: Skin is warm and dry.  Neurological:     Mental Status: She is alert and oriented to person, place, and time.  Psychiatric:        Behavior: Behavior normal.   (optional), or other factors deemed appropriate based on the beneficiary's medical and social history and current clinical standards.  Advanced Directives:      Assessment:    This is a routine wellness examination for this patient .   Vision/Hearing screen No results found.  Dietary issues and exercise activities discussed:  Current Exercise Habits: Home exercise routine, Type of exercise: walking;yoga, Time (Minutes): 45, Frequency (Times/Week): 5, Weekly Exercise (Minutes/Week): 225, Intensity: Moderate, Exercise limited by: None identified   Goals     . Exercise 150 min/wk Moderate Activity     Keep up the great work on exercise!!!       Depression Screen    09/06/2021    2:18 PM 09/03/2020    8:40 AM 08/13/2019    9:36 AM 07/13/2017   10:45 AM  PHQ 2/9 Scores  PHQ - 2 Score 0 0 0 0     Fall Risk    05/05/2022    1:42 PM  Fall Risk   Falls in the past year? 0  Number falls in past yr: 0  Injury with Fall? 0  Risk for fall due to : No Fall Risks  Follow up Falls evaluation completed    Cognitive Function:        05/05/2022    1:45 PM  6CIT Screen  What Year? 0 points  What month? 0 points  What time? 0 points  Count back from 20 0 points  Months in reverse 0 points  Repeat phrase 0 points  Total Score 0 points    Patient  Care Team: Agapito Games, MD as PCP - General     Plan:     I have personally reviewed and noted the following in the patient's chart:   Medical and social history Use of alcohol, tobacco or illicit drugs  Current medications and supplements Functional ability and status Nutritional status Physical activity Advanced directives List of other physicians Hospitalizations, surgeries, and ER visits in previous 12 months Vitals Screenings to include cognitive, depression, and  falls Referrals and appointments Orders Placed This Encounter  Procedures  . DG Bone Density    Standing Status:   Future    Standing Expiration Date:   05/06/2023    Order Specific Question:   Reason for Exam (SYMPTOM  OR DIAGNOSIS REQUIRED)    Answer:   post meno    Order Specific Question:   Preferred imaging location?    Answer:   [facility]  . EKG 12-Lead   EKG shows rate of 45 bpm, sinus bradycardia.  In looking back her pulse often is a little on the low end in the 50s or 60s.  She does have an old EKG on file.  She does have a flipped T wave in lead V2 that was not present previously but is normal in V3.  No acute findings.  In addition, I have reviewed and discussed with patient certain preventive protocols, quality metrics, and best practice recommendations. A written personalized care plan for preventive services as well as general preventive health recommendations were provided to patient.     Beatrice Lecher, MD 05/05/2022

## 2022-05-09 ENCOUNTER — Encounter: Payer: Self-pay | Admitting: Obstetrics & Gynecology

## 2022-05-09 ENCOUNTER — Encounter: Payer: Self-pay | Admitting: Family Medicine

## 2022-05-13 DIAGNOSIS — F4320 Adjustment disorder, unspecified: Secondary | ICD-10-CM | POA: Diagnosis not present

## 2022-05-26 ENCOUNTER — Encounter: Payer: Self-pay | Admitting: Obstetrics and Gynecology

## 2022-05-26 ENCOUNTER — Telehealth: Payer: Medicare PPO | Admitting: Obstetrics and Gynecology

## 2022-05-26 DIAGNOSIS — N951 Menopausal and female climacteric states: Secondary | ICD-10-CM | POA: Diagnosis not present

## 2022-05-26 MED ORDER — ESTRADIOL 0.1 MG/GM VA CREA: 2.0000 g | TOPICAL_CREAM | Freq: Every day | VAGINAL | 2 refills | Status: DC

## 2022-05-26 MED ORDER — ESTRADIOL 10 MCG VA TABS: ORAL_TABLET | VAGINAL | 6 refills | Status: DC

## 2022-05-26 NOTE — Progress Notes (Signed)
    GYNECOLOGY VIRTUAL VISIT ENCOUNTER NOTE  Provider location: [facility] [place]   Patient location: Home  I connected with Becky Bowers on 05/26/22 at  8:30 AM EDT by MyChart Video Encounter and verified that I am speaking with the correct person using two identifiers.   I discussed the limitations, risks, security and privacy concerns of performing an evaluation and management service virtually and the availability of in person appointments. I also discussed with the patient that there may be a patient responsible charge related to this service. The patient expressed understanding and agreed to proceed.   History:  Becky Bowers is a 66 y.o. G29P0011 female being evaluated today for refill on medication. Patient has not been able to obtain her vaginal estrogen tablet due to a change in medical insurance. She is sexually active and reports vaginal dryness. She has used estrogen tablet with good results for the past several years and desires to continue. She reports that she would use the tablet one or twice a week. Patient is without any other complaints. She denies urinary incontinence. She denies any abnormal vaginal discharge, bleeding, pelvic pain or other concerns.       Past Medical History:  Diagnosis Date   Arthritis    Past Surgical History:  Procedure Laterality Date   APPENDECTOMY  1968   DILATION AND CURETTAGE OF [place]   The following portions of the patient's history were reviewed and updated as appropriate: allergies, current medications, past family history, past medical history, past social history, past surgical history and problem list.   Health Maintenance:  Normal pap and negative HRHPV on 08/2021.  Normal mammogram on 09/2021.   Review of Systems:  Pertinent items noted in HPI and remainder of comprehensive ROS otherwise negative.  Physical Exam:   General:  Alert, oriented and cooperative. Patient  appears to be in no acute distress.  Mental Status: Normal mood and affect. Normal behavior. Normal judgment and thought content.   Respiratory: Normal respiratory effort, no problems with respiration noted  Rest of physical exam deferred due to type of encounter  Labs and Imaging No results found for this or any previous visit (from the past 336 hour(s)). No results found.     Assessment and Plan:     1. Vaginal dryness, menopausal Patient desires to continue with vaginal estrogen Reviewed different estrogen formulation such as vaginal ring vs vaginal cream.  According to Epic, both formulation (cream and tablet) are under the preferred plan. Will contact pharmacy to address any issues Patient willing to try vaginal cream again if unable to obtain tablet. She understands that the eventual goal is to wean off the estrogen Patient is without any other complaints       I discussed the assessment and treatment plan with the patient. The patient was provided an opportunity to ask questions and all were answered. The patient agreed with the plan and demonstrated an understanding of the instructions.   The patient was advised to call back or seek an in-person evaluation/go to the ED if the symptoms worsen or if the condition fails to improve as anticipated.  I provided 15 minutes of face-to-face time during this encounter.   Mora Bellman, MD [facility]

## 2022-05-27 DIAGNOSIS — F4320 Adjustment disorder, unspecified: Secondary | ICD-10-CM | POA: Diagnosis not present

## 2022-06-07 ENCOUNTER — Encounter: Payer: Self-pay | Admitting: Emergency Medicine

## 2022-06-07 ENCOUNTER — Ambulatory Visit (INDEPENDENT_AMBULATORY_CARE_PROVIDER_SITE_OTHER): Payer: Medicare PPO

## 2022-06-07 ENCOUNTER — Ambulatory Visit
Admission: EM | Admit: 2022-06-07 | Discharge: 2022-06-07 | Disposition: A | Discharge status: Home / Self Care | Payer: Medicare PPO | Attending: Family Medicine | Admitting: Family Medicine

## 2022-06-07 DIAGNOSIS — M25511 Pain in right shoulder: Secondary | ICD-10-CM

## 2022-06-07 MED ORDER — PREDNISONE 10 MG (21) PO TBPK: ORAL_TABLET | Freq: Every day | ORAL | 0 refills | Status: DC

## 2022-06-07 MED ORDER — CELECOXIB 100 MG PO CAPS: 100.0000 mg | ORAL_CAPSULE | Freq: Two times a day (BID) | ORAL | 0 refills | Status: DC

## 2022-06-07 NOTE — Discharge Instructions (Addendum)
Advised patient of right shoulder x-ray results with hard copy provided to patient.  Instructed patient to discontinue meloxicam.  Advised patient to take medications as directed with food to completion.  Advised patient to take prednisone with first dose of Celebrex for the next 10 days.  Encourage patient increase daily water intake to 64 ounces per day while taking these medications.  Advised patient if symptoms worsen and/or unresolved please follow-up with your orthopedic provider for further evaluation.

## 2022-06-07 NOTE — ED Provider Notes (Signed)
[facility]y [facility]    CSN: [id number] Arrival date & time: 06/07/22  1217      History   Chief Complaint Chief Complaint  Patient presents with   Shoulder Pain    right    HPI Becky Bowers is a 66 y.o. female.   HPI 66 year old female presents with shoulder pain for 4 days..  Patient reports episode while camping and earlier in the week where she got up early Wednesday morning lost her balance and tried to grab tent with outstretched right arm where she may have injured her shoulder.  PMH significant for Polyarthralgia, headache and elevated LDL cholesterol.  Past Medical History:  Diagnosis Date   Arthritis     Patient Active Problem List   Diagnosis Date Noted   Elevated LDL cholesterol level 04/14/2021   Polyarthralgia 03/17/2021   Concussion 09/12/2019   Primary osteoarthritis right first [facility] and right second MCP 04/24/2018   Numbness of right third and fourth toes 03/19/2018   Bursitis of right hip 12/08/2015   Headache 08/12/2010   LUMBAGO 01/07/2008    Past Surgical History:  Procedure Laterality Date   APPENDECTOMY  1968   DILATION AND CURETTAGE OF UTERUS  1990   TONSILLECTOMY  1980    OB History     Gravida  2   Para  1   Term      Preterm      AB  1   Living  1      SAB  1   IAB      Ectopic      Multiple      Live Births               Home Medications    Prior to Admission medications   Medication Sig Start Date End Date Taking? Authorizing Provider  celecoxib (CELEBREX) 100 MG capsule Take 1 capsule (100 mg total) by mouth 2 (two) times daily for 10 days. 06/07/22 06/17/22 Yes Trevor Iha, FNP  predniSONE (STERAPRED UNI-PAK 21 TAB) 10 MG (21) TBPK tablet Take by mouth daily. Take 6 tabs by mouth daily  for 2 days, then 5 tabs for 2 days, then 4 tabs for 2 days, then 3 tabs for 2 days, 2 tabs for 2 days, then 1 tab by mouth daily for 2 days 06/07/22  Yes Trevor Iha, FNP  Azelastine-Fluticasone 137-50  MCG/ACT SUSP Place 1 Act into the nose 2 (two) times daily as needed.    Christen Butter, NP  Calcium Carbonate-Vitamin D 600-200 MG-UNIT TABS Take 1 tablet by mouth 2 (two) times daily.    [provider]  estradiol (ESTRACE VAGINAL) 0.1 MG/GM vaginal cream Place 2 g vaginally daily. Patient not taking: Reported on 06/07/2022 05/26/22   Constant, Peggy, MD  Estradiol 10 MCG TABS vaginal tablet 1 tablet per vagina daily 05/26/22   Constant, Peggy, MD  ibandronate (BONIVA) 150 MG tablet Take 1 tablet (150 mg total) by mouth every 30 (thirty) days. Take in the morning with a full glass of water, on an empty stomach, and do not take anything else by mouth or lie down for the next 30 min. 03/11/21   Agapito Games, MD  Multiple Vitamins-Minerals (ONE-A-DAY WOMENS 50+ ADVANTAGE PO) Take 1 capsule by mouth daily.    [provider]  valACYclovir (VALTREX) 1000 MG tablet Take 1 tablet (1,000 mg total) by mouth as needed. 09/03/20   Conan Bowens, MD    Family History Family  History  Problem Relation Age of Onset   Heart attack Mother    Diabetes Father    Hypertension Father    Squamous cell carcinoma Father        skin    Atrial fibrillation Father    Diabetes Brother    Hypertension Brother    Colon cancer Paternal Grandmother    Alzheimer's disease Paternal Grandmother     Social History Social History   Tobacco Use   Smoking status: Never   Smokeless tobacco: Never  Vaping Use   Vaping Use: Never used  Substance Use Topics   Alcohol use: Yes    Alcohol/week: 2.0 - 4.0 standard drinks of alcohol    Types: 2 - 4 Glasses of wine per week    Comment: 4-6 glasses of wine a week   Drug use: No     Allergies   Patient has no known allergies.   Review of Systems Review of Systems  Musculoskeletal:        Right shoulder pain for 4-5 days     Physical Exam Triage Vital Signs ED Triage Vitals  Enc Vitals Group     BP 06/07/22 1237 119/72     Pulse Rate  06/07/22 1237 60     Resp 06/07/22 1237 14     Temp 06/07/22 1237 99.2 F (37.3 C)     Temp Source 06/07/22 1237 Oral     SpO2 06/07/22 1237 100 %     Weight 06/07/22 1239 126 lb (57.2 kg)     Height 06/07/22 1239 5\' 4"  (1.626 m)     Head Circumference --      Peak Flow --      Pain Score 06/07/22 1238 3     Pain Loc --      Pain Edu? --      Excl. in GC? --    No data found.  Updated Vital Signs BP 119/72 (BP Location: Left Arm)   Pulse 60   Temp 99.2 F (37.3 C) (Oral)   Resp 14   Ht 5\' 4"  (1.626 m)   Wt 126 lb (57.2 kg)   SpO2 100%   BMI 21.63 kg/m    Physical Exam Vitals and nursing note reviewed.  Constitutional:      Appearance: Normal appearance. She is normal weight.  HENT:     Head: Normocephalic and atraumatic.     Mouth/Throat:     Mouth: Mucous membranes are moist.     Pharynx: Oropharynx is clear.  Eyes:     Extraocular Movements: Extraocular movements intact.     Conjunctiva/sclera: Conjunctivae normal.     Pupils: Pupils are equal, round, and reactive to light.  Cardiovascular:     Rate and Rhythm: Normal rate and regular rhythm.     Pulses: Normal pulses.     Heart sounds: Normal heart sounds. No murmur heard. Pulmonary:     Effort: Pulmonary effort is normal.     Breath sounds: Normal breath sounds. No wheezing, rhonchi or rales.  Musculoskeletal:        General: Normal range of motion.     Cervical back: Normal range of motion and neck supple.     Comments: Right shoulder (anterior/posterior aspect): TTP over GH joint, limited range of motion with abduction, exam limited due to pain  Skin:    General: Skin is warm and dry.  Neurological:     General: No focal deficit present.     Mental Status:  She is alert and oriented to person, place, and time. Mental status is at baseline.      UC Treatments / Results  Labs (all labs ordered are listed, but only abnormal results are displayed) Labs Reviewed - No data to  display  EKG   Radiology DG Shoulder Right  Result Date: 06/07/2022 CLINICAL DATA:  Right shoulder pain for the past 4-5 days following a fall. EXAM: RIGHT SHOULDER - 2+ VIEW COMPARISON:  06/25/2014 FINDINGS: Mild inferior glenohumeral spur formation with progression. Interval rotator cuff calcification. Interval small cysts in the superior aspect of the humeral head. No fracture or dislocation. IMPRESSION: 1. No fracture or dislocation. 2. Mild glenohumeral degenerative changes with progression. 3. Calcific rotator cuff tendinitis. Electronically Signed   By: Claudie Revering M.D.   On: 06/07/2022 13:04    Procedures Procedures (including critical care time)  Medications Ordered in UC Medications - No data to display  Initial Impression / Assessment and Plan / UC Course  I have reviewed the triage vital signs and the nursing notes.  Pertinent labs & imaging results that were available during my care of the patient were reviewed by me and considered in my medical decision making (see chart for details).     MDM: 1.  Right shoulder pain, unspecified chronicity-right shoulder x-ray revealed above. Advised patient of right shoulder x-ray results with hard copy provided to patient.  Instructed patient to discontinue meloxicam.  Advised patient to take medications as directed with food to completion.  Advised patient to take prednisone with first dose of Celebrex for the next 10 days.  Encouraged patient increase daily water intake to 64 ounces per day while taking these medications.  Advised patient if symptoms worsen and/or unresolved please follow-up with your orthopedic provider for further evaluation. Final Clinical Impressions(s) / UC Diagnoses   Final diagnoses:  Right shoulder pain, unspecified chronicity     Discharge Instructions      Advised patient of right shoulder x-ray results with hard copy provided to patient.  Instructed patient to discontinue meloxicam.  Advised patient  to take medications as directed with food to completion.  Advised patient to take prednisone with first dose of Celebrex for the next 10 days.  Encourage patient increase daily water intake to 64 ounces per day while taking these medications.  Advised patient if symptoms worsen and/or unresolved please follow-up with your orthopedic provider for further evaluation.     ED Prescriptions     Medication Sig Dispense Auth. Provider   celecoxib (CELEBREX) 100 MG capsule Take 1 capsule (100 mg total) by mouth 2 (two) times daily for 10 days. 20 capsule Eliezer Lofts, FNP   predniSONE (STERAPRED UNI-PAK 21 TAB) 10 MG (21) TBPK tablet Take by mouth daily. Take 6 tabs by mouth daily  for 2 days, then 5 tabs for 2 days, then 4 tabs for 2 days, then 3 tabs for 2 days, 2 tabs for 2 days, then 1 tab by mouth daily for 2 days 42 tablet Eliezer Lofts, FNP      PDMP not reviewed this encounter.   Eliezer Lofts, [place] 06/07/22 1555

## 2022-06-07 NOTE — ED Triage Notes (Signed)
Right shoulder pain - woke up on Friday with it  Was camping earlier in the week and was getting up at [facility] and lost her balance getting up at 0400  Pt states "I was grabbing at the tent w/ right arm but did not fall" ROM limited due to pain  Min relief with ibuprofen

## 2022-06-08 ENCOUNTER — Telehealth: Payer: Self-pay | Admitting: Emergency Medicine

## 2022-06-08 NOTE — Telephone Encounter (Signed)
Spoke with patient states that she is feeling so much better today.  Will follow up as needed.

## 2022-06-10 ENCOUNTER — Encounter: Payer: Self-pay | Admitting: Sports Medicine

## 2022-06-10 ENCOUNTER — Ambulatory Visit (INDEPENDENT_AMBULATORY_CARE_PROVIDER_SITE_OTHER): Payer: Medicare PPO | Admitting: Sports Medicine

## 2022-06-10 VITALS — Ht 64.0 in | Wt 130.0 lb

## 2022-06-10 DIAGNOSIS — M19011 Primary osteoarthritis, right shoulder: Secondary | ICD-10-CM

## 2022-06-10 NOTE — Progress Notes (Signed)
    Procedures performed today:    None.  Independent interpretation of notes and tests performed by another provider:   None.  Brief History, Exam, Impression, and Recommendations:    Primary osteoarthritis of right shoulder This is a pleasant 66 year old female, she has a long history of shoulder pain, worsened recently after a camping trip to the mountains, where she took a misstep and try to catch herself by grabbing on her shoulder, it was forced into abduction. She was seen in urgent care, x-rays showed moderate to severe glenohumeral osteoarthritis, she was given a course of prednisone, Celebrex, she was taking prednisone and Celebrex at the same time, advised her to go ahead and hold off on the Celebrex and only started after she finishes her prednisone. On exam she does have pain with external rotation and abduction consistent with a glenohumeral pain generator. She will continue the above plan, adding at home physical therapy, return to see me in 4 to 6 weeks as needed for glenohumeral injection.    ____________________________________________ Gwen Her. Dianah Field, M.D., ABFM., CAQSM., AME. Primary Care and Sports Medicine [facility]  Adjunct Professor of [employer]  Risk manager

## 2022-06-10 NOTE — Assessment & Plan Note (Signed)
This is a pleasant 66 year old female, she has a long history of shoulder pain, worsened recently after a camping trip to the mountains, where she took a misstep and try to catch herself by grabbing on her shoulder, it was forced into abduction. She was seen in urgent care, x-rays showed moderate to severe glenohumeral osteoarthritis, she was given a course of prednisone, Celebrex, she was taking prednisone and Celebrex at the same time, advised her to go ahead and hold off on the Celebrex and only started after she finishes her prednisone. On exam she does have pain with external rotation and abduction consistent with a glenohumeral pain generator. She will continue the above plan, adding at home physical therapy, return to see me in 4 to 6 weeks as needed for glenohumeral injection.

## 2022-06-15 ENCOUNTER — Other Ambulatory Visit: Payer: Self-pay | Admitting: Family Medicine

## 2022-06-15 DIAGNOSIS — M810 Age-related osteoporosis without current pathological fracture: Secondary | ICD-10-CM

## 2022-06-17 DIAGNOSIS — F4320 Adjustment disorder, unspecified: Secondary | ICD-10-CM | POA: Diagnosis not present

## 2022-07-01 DIAGNOSIS — F4320 Adjustment disorder, unspecified: Secondary | ICD-10-CM | POA: Diagnosis not present

## 2022-07-14 DIAGNOSIS — F4320 Adjustment disorder, unspecified: Secondary | ICD-10-CM | POA: Diagnosis not present

## 2022-07-22 ENCOUNTER — Ambulatory Visit: Payer: Medicare PPO | Admitting: Sports Medicine

## 2022-07-22 ENCOUNTER — Ambulatory Visit (INDEPENDENT_AMBULATORY_CARE_PROVIDER_SITE_OTHER): Payer: Medicare PPO

## 2022-07-22 DIAGNOSIS — M25551 Pain in right hip: Secondary | ICD-10-CM | POA: Diagnosis not present

## 2022-07-22 DIAGNOSIS — M5136 Other intervertebral disc degeneration, lumbar region: Secondary | ICD-10-CM

## 2022-07-22 DIAGNOSIS — M19011 Primary osteoarthritis, right shoulder: Secondary | ICD-10-CM

## 2022-07-22 DIAGNOSIS — M545 Low back pain, unspecified: Secondary | ICD-10-CM

## 2022-07-22 DIAGNOSIS — M47816 Spondylosis without myelopathy or radiculopathy, lumbar region: Secondary | ICD-10-CM | POA: Diagnosis not present

## 2022-07-22 DIAGNOSIS — M25552 Pain in left hip: Secondary | ICD-10-CM

## 2022-07-22 DIAGNOSIS — M160 Bilateral primary osteoarthritis of hip: Secondary | ICD-10-CM

## 2022-07-22 MED ORDER — CELECOXIB 200 MG PO CAPS: ORAL_CAPSULE | ORAL | 2 refills | Status: DC

## 2022-07-22 NOTE — Assessment & Plan Note (Signed)
Axial low back pain, worse with prolonged sitting. Adding x-rays, advanced herniated disc conditioning, avoid deep flexion, refilling Celebrex, return in 6 weeks as needed.

## 2022-07-22 NOTE — Assessment & Plan Note (Signed)
Pleasant 66 year old female, several months of bilateral groin pain, moderate gelling, pain with internal rotation. Adding x-rays, refilling Celebrex, hip conditioning given, return to see me in 6 weeks if needed.

## 2022-07-22 NOTE — Progress Notes (Signed)
    Procedures performed today:    None.  Independent interpretation of notes and tests performed by another provider:   None.  Brief History, Exam, Impression, and Recommendations:    Primary osteoarthritis of both hips Pleasant 66 year old female, several months of bilateral groin pain, moderate gelling, pain with internal rotation. Adding x-rays, refilling Celebrex, hip conditioning given, return to see me in 6 weeks if needed.  Lumbar degenerative disc disease Axial low back pain, worse with prolonged sitting. Adding x-rays, advanced herniated disc conditioning, avoid deep flexion, refilling Celebrex, return in 6 weeks as needed.  Primary osteoarthritis of right shoulder Did note good improvement in shoulder pain when doing conditioning and taking Celebrex, she has somewhat fallen off the wagon here, she will get more consistent with her home conditioning, refilling Celebrex, we can do injections if not better in the future.    ____________________________________________ Ihor Austin. Benjamin Stain, M.D., ABFM., CAQSM., AME. Primary Care and Sports Medicine [facility]  Adjunct Professor of Family Medicine  [place] [employer] [facility] [employer]  Restaurant manager, fast food

## 2022-07-22 NOTE — Assessment & Plan Note (Signed)
Did note good improvement in shoulder pain when doing conditioning and taking Celebrex, she has somewhat fallen off the wagon here, she will get more consistent with her home conditioning, refilling Celebrex, we can do injections if not better in the future.

## 2022-07-27 ENCOUNTER — Other Ambulatory Visit: Payer: Self-pay | Admitting: Family Medicine

## 2022-07-27 DIAGNOSIS — Z1231 Encounter for screening mammogram for malignant neoplasm of breast: Secondary | ICD-10-CM

## 2022-07-28 DIAGNOSIS — F4320 Adjustment disorder, unspecified: Secondary | ICD-10-CM | POA: Diagnosis not present

## 2022-08-12 DIAGNOSIS — F4320 Adjustment disorder, unspecified: Secondary | ICD-10-CM | POA: Diagnosis not present

## 2022-08-17 ENCOUNTER — Ambulatory Visit (INDEPENDENT_AMBULATORY_CARE_PROVIDER_SITE_OTHER): Payer: Medicare PPO

## 2022-08-17 DIAGNOSIS — M810 Age-related osteoporosis without current pathological fracture: Secondary | ICD-10-CM | POA: Diagnosis not present

## 2022-08-17 DIAGNOSIS — M8588 Other specified disorders of bone density and structure, other site: Secondary | ICD-10-CM | POA: Diagnosis not present

## 2022-08-17 DIAGNOSIS — Z780 Asymptomatic menopausal state: Secondary | ICD-10-CM | POA: Diagnosis not present

## 2022-08-18 NOTE — Progress Notes (Signed)
Becky Bowers, your bone density test shows that your bones are mildly thin also known as osteopenia.  T-score is -1.5.  I would recommend that we continue the [place] for a full 5-10 years and at that point we can take a drug holiday for 3 to 5 years unless you have a change.

## 2022-08-22 ENCOUNTER — Ambulatory Visit (INDEPENDENT_AMBULATORY_CARE_PROVIDER_SITE_OTHER): Payer: Medicare PPO | Admitting: Sports Medicine

## 2022-08-22 DIAGNOSIS — M160 Bilateral primary osteoarthritis of hip: Secondary | ICD-10-CM | POA: Diagnosis not present

## 2022-08-22 DIAGNOSIS — M5136 Other intervertebral disc degeneration, lumbar region: Secondary | ICD-10-CM

## 2022-08-22 DIAGNOSIS — M1811 Unilateral primary osteoarthritis of first carpometacarpal joint, right hand: Secondary | ICD-10-CM

## 2022-08-22 DIAGNOSIS — M51369 Other intervertebral disc degeneration, lumbar region without mention of lumbar back pain or lower extremity pain: Secondary | ICD-10-CM

## 2022-08-22 DIAGNOSIS — M19011 Primary osteoarthritis, right shoulder: Secondary | ICD-10-CM

## 2022-08-22 NOTE — Progress Notes (Signed)
    Procedures performed today:    None.  Independent interpretation of notes and tests performed by another provider:   None.  Brief History, Exam, Impression, and Recommendations:    Lumbar degenerative disc disease Axial low back pain, worse with prolonged sitting, suspected disc type pathology, we treated her conservatively and she has improved considerably.  Primary osteoarthritis of both hips Did have some increasing right groin pain but this resolved on its own with conservative treatment.  Primary osteoarthritis of right shoulder Almost completely resolved with conservative treatment and Celebrex.  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 She is going on a trip to [place] next week, so we will potentially do an injection this Friday.    ____________________________________________ Gwen Her. Dianah Field, M.D., ABFM., CAQSM., AME. Primary Care and Sports Medicine [place] [facility]  Adjunct Professor of [place] of [facility] [employer]  Risk manager

## 2022-08-22 NOTE — Assessment & Plan Note (Signed)
Almost completely resolved with conservative treatment and Celebrex.

## 2022-08-22 NOTE — Assessment & Plan Note (Signed)
Right first [facility] is doing okay she last had an injection in May. She is going on a trip to [place] next week, so we will potentially do an injection this Friday.

## 2022-08-22 NOTE — Assessment & Plan Note (Signed)
Did have some increasing right groin pain but this resolved on its own with conservative treatment.

## 2022-08-22 NOTE — Assessment & Plan Note (Signed)
Axial low back pain, worse with prolonged sitting, suspected disc type pathology, we treated her conservatively and she has improved considerably.

## 2022-08-23 ENCOUNTER — Encounter: Payer: Self-pay | Admitting: Family Medicine

## 2022-08-24 NOTE — Telephone Encounter (Signed)
OK, sounds good. Yes we can take drug holiday and recheck DEXA in 2 years

## 2022-08-26 ENCOUNTER — Ambulatory Visit (INDEPENDENT_AMBULATORY_CARE_PROVIDER_SITE_OTHER): Payer: Medicare PPO

## 2022-08-26 ENCOUNTER — Ambulatory Visit (INDEPENDENT_AMBULATORY_CARE_PROVIDER_SITE_OTHER): Payer: Medicare PPO | Admitting: Sports Medicine

## 2022-08-26 DIAGNOSIS — M1811 Unilateral primary osteoarthritis of first carpometacarpal joint, right hand: Secondary | ICD-10-CM | POA: Diagnosis not present

## 2022-08-26 DIAGNOSIS — F4320 Adjustment disorder, unspecified: Secondary | ICD-10-CM | POA: Diagnosis not present

## 2022-08-26 MED ORDER — TRIAMCINOLONE ACETONIDE 40 MG/ML IJ SUSP
40.0000 mg | Freq: Once | INTRAMUSCULAR | Status: AC
  Administered 2022-08-26: 40 mg via INTRAMUSCULAR

## 2022-08-26 NOTE — Addendum Note (Signed)
Addended by: Tarri Glenn A on: 08/26/2022 11:15 AM   Modules accepted: Orders

## 2022-08-26 NOTE — Assessment & Plan Note (Signed)
Increasing pain right first Becky Bowers, she does have a trip coming up to [place] next week. We injected her first [facility] Becky today on the right, return to see me in 6 weeks.

## 2022-08-26 NOTE — Progress Notes (Signed)
    Procedures performed today:    Procedure: Real-time Ultrasound Guided injection of the right first [facility] Device: Samsung HS60  Verbal informed consent obtained.  Time-out conducted.  Noted no overlying erythema, induration, or other signs of local infection.  Skin prepped in a sterile fashion.  Local anesthesia: Topical Ethyl chloride.  With sterile technique and under real time ultrasound guidance: Arthritic joint noted, 1/2 cc lidocaine, 1/2 cc kenalog 40 injected easily. Procedure completed without difficulty. Advised to call if fevers/chills, erythema, induration, drainage, or persistent bleeding.  Images permanently stored and available for review in PACS.  Impression: Technically successful ultrasound guided injection.  Independent interpretation of notes and tests performed by another provider:   None.  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 We injected her first [facility] today on the right, return to see me in 6 weeks.    ____________________________________________ Gwen Her. Dianah Field, M.D., ABFM., CAQSM., AME. Primary Care and Sports Medicine [place] [facility]  Adjunct Professor of [place] of [facility] of Medicine  Risk manager

## 2022-09-01 ENCOUNTER — Telehealth: Payer: Medicare PPO | Admitting: Physician Assistant

## 2022-09-01 DIAGNOSIS — U071 COVID-19: Secondary | ICD-10-CM | POA: Diagnosis not present

## 2022-09-01 MED ORDER — BENZONATATE 100 MG PO CAPS: 100.0000 mg | ORAL_CAPSULE | Freq: Three times a day (TID) | ORAL | 0 refills | Status: DC | PRN

## 2022-09-01 MED ORDER — MOLNUPIRAVIR EUA 200MG CAPSULE: 4.0000 | ORAL_CAPSULE | Freq: Two times a day (BID) | ORAL | 0 refills | Status: AC

## 2022-09-01 NOTE — Patient Instructions (Signed)
Becky Bowers, thank you for joining Leeanne Rio, PA-C for today's virtual visit.  While this provider is not your primary care provider (PCP), if your PCP is located in our provider database this encounter information will be shared with them immediately following your visit.   [facility] account gives you access to today's visit and all your visits, tests, and labs performed at [facility] " click here if you don't have a [place] account or go to [email or web address]  Consent: (Patient) Becky Bowers provided verbal consent for this virtual visit at the beginning of the encounter.  Current Medications:  Current Outpatient Medications:    Azelastine-Fluticasone 137-50 MCG/ACT SUSP, Place 1 Act into the nose 2 (two) times daily as needed., Disp: , Rfl:    Calcium Carbonate-Vitamin D 600-200 MG-UNIT TABS, Take 1 tablet by mouth 2 (two) times daily., Disp: , Rfl:    celecoxib (CELEBREX) 200 MG capsule, One to 2 tablets by mouth daily as needed for pain., Disp: 180 capsule, Rfl: 2   estradiol (ESTRACE VAGINAL) 0.1 MG/GM vaginal cream, Place 2 g vaginally daily., Disp: 42.5 g, Rfl: 2   Estradiol 10 MCG TABS vaginal tablet, 1 tablet per vagina daily, Disp: 30 tablet, Rfl: 6   ibandronate (BONIVA) 150 MG tablet, TAKE 1 TABLET BY MOUTH EVERY 30 DAYS, IN THE MORNING WITH FULL GLASS OF WATER ON AN EMPTY STOMACH, NOTHING BY MOUTH OR LIE DOWN X THE NEXT 30 MINUTES, Disp: 3 tablet, Rfl: 4   Multiple Vitamins-Minerals (ONE-A-DAY WOMENS 50+ ADVANTAGE PO), Take 1 capsule by mouth daily., Disp: , Rfl:    valACYclovir (VALTREX) 1000 MG tablet, Take 1 tablet (1,000 mg total) by mouth as needed., Disp: 30 tablet, Rfl: 3   Medications ordered in this encounter:  No orders of the defined types were placed in this encounter.    *If you need refills on other medications prior to your next appointment, please contact your pharmacy*  Follow-Up: Call back or  seek an in-person evaluation if the symptoms worsen or if the condition fails to improve as anticipated.  [place] [phone]  Other Instructions Please keep well-hydrated and get plenty of rest. Start a saline nasal rinse to flush out your nasal passages. You can use plain Mucinex to help thin congestion. Tessalon as directed for cough. If you have a humidifier, running in the bedroom at night. I want you to start OTC vitamin D3 1000 units daily, vitamin C 1000 mg daily, and a zinc supplement. Please take prescribed medications as directed.  You were to quarantine for 5 days from onset of your symptoms.  After day 5, if you have had no fever and you are feeling better, you can end quarantine but need to mask for an additional 5 days. After day 5 if you have a fever or are having significant symptoms, please quarantine for full 10 days.  If you note any worsening of symptoms, any significant shortness of breath or any chest pain, please seek ER evaluation ASAP.  Please do not delay care!  COVID-19: What to Do if You Are Sick If you test positive and are an older adult or someone who is at high risk of getting very sick from COVID-19, treatment may be available. Contact a healthcare provider right away after a positive test to determine if you are eligible, even if your symptoms are mild right now. You can also visit a Test to Treat location and, if  eligible, receive a prescription from a provider. Don't delay: Treatment must be started within the first few days to be effective. If you have a fever, cough, or other symptoms, you might have COVID-19. Most people have mild illness and are able to recover at home. If you are sick: Keep track of your symptoms. If you have an emergency warning sign (including trouble breathing), call 911. Steps to help prevent the spread of COVID-19 if you are sick If you are sick with COVID-19 or think you might have COVID-19, follow the steps  below to care for yourself and to help protect other people in your home and community. Stay home except to get medical care Stay home. Most people with COVID-19 have mild illness and can recover at home without medical care. Do not leave your home, except to get medical care. Do not visit public areas and do not go to places where you are unable to wear a mask. Take care of yourself. Get rest and stay hydrated. Take over-the-counter medicines, such as acetaminophen, to help you feel better. Stay in touch with your doctor. Call before you get medical care. Be sure to get care if you have trouble breathing, or have any other emergency warning signs, or if you think it is an emergency. Avoid public transportation, ride-sharing, or taxis if possible. Get tested If you have symptoms of COVID-19, get tested. While waiting for test results, stay away from others, including staying apart from those living in your household. Get tested as soon as possible after your symptoms start. Treatments may be available for people with COVID-19 who are at risk for becoming very sick. Don't delay: Treatment must be started early to be effective--some treatments must begin within 5 days of your first symptoms. Contact your healthcare provider right away if your test result is positive to determine if you are eligible. Self-tests are one of several options for testing for the virus that causes COVID-19 and may be more convenient than laboratory-based tests and point-of-care tests. Ask your healthcare provider or your local health department if you need help interpreting your test results. You can visit your state, tribal, local, and territorial health department's website to look for the latest local information on testing sites. Separate yourself from other people As much as possible, stay in a specific room and away from other people and pets in your home. If possible, you should use a separate bathroom. If you need to be  around other people or animals in or outside of the home, wear a well-fitting mask. Tell your close contacts that they may have been exposed to COVID-19. An infected person can spread COVID-19 starting 48 hours (or 2 days) before the person has any symptoms or tests positive. By letting your close contacts know they may have been exposed to COVID-19, you are helping to protect everyone. See COVID-19 and Animals if you have questions about pets. If you are diagnosed with COVID-19, someone from the health department may call you. Answer the call to slow the spread. Monitor your symptoms Symptoms of COVID-19 include fever, cough, or other symptoms. Follow care instructions from your healthcare provider and local health department. Your local health authorities may give instructions on checking your symptoms and reporting information. When to seek emergency medical attention Look for emergency warning signs* for COVID-19. If someone is showing any of these signs, seek emergency medical care immediately: Trouble breathing Persistent pain or pressure in the chest New confusion Inability to wake or stay  awake Pale, gray, or blue-colored skin, lips, or nail beds, depending on skin tone *This list is not all possible symptoms. Please call your medical provider for any other symptoms that are severe or concerning to you. Call 911 or call ahead to your local emergency facility: Notify the operator that you are seeking care for someone who has or may have COVID-19. Call ahead before visiting your doctor Call ahead. Many medical visits for routine care are being postponed or done by phone or telemedicine. If you have a medical appointment that cannot be postponed, call your doctor's office, and tell them you have or may have COVID-19. This will help the office protect themselves and other patients. If you are sick, wear a well-fitting mask You should wear a mask if you must be around other people or animals,  including pets (even at home). Wear a mask with the best fit, protection, and comfort for you. You don't need to wear the mask if you are alone. If you can't put on a mask (because of trouble breathing, for example), cover your coughs and sneezes in some other way. Try to stay at least 6 feet away from other people. This will help protect the people around you. Masks should not be placed on young children under age 36 years, anyone who has trouble breathing, or anyone who is not able to remove the mask without help. Cover your coughs and sneezes Cover your mouth and nose with a tissue when you cough or sneeze. Throw away used tissues in a lined trash can. Immediately wash your hands with soap and water for at least 20 seconds. If soap and water are not available, clean your hands with an alcohol-based hand sanitizer that contains at least 60% alcohol. Clean your hands often Wash your hands often with soap and water for at least 20 seconds. This is especially important after blowing your nose, coughing, or sneezing; going to the bathroom; and before eating or preparing food. Use hand sanitizer if soap and water are not available. Use an alcohol-based hand sanitizer with at least 60% alcohol, covering all surfaces of your hands and rubbing them together until they feel dry. Soap and water are the best option, especially if hands are visibly dirty. Avoid touching your eyes, nose, and mouth with unwashed hands. Handwashing Tips Avoid sharing personal household items Do not share dishes, drinking glasses, cups, eating utensils, towels, or bedding with other people in your home. Wash these items thoroughly after using them with soap and water or put in the dishwasher. Clean surfaces in your home regularly Clean and disinfect high-touch surfaces (for example, doorknobs, tables, handles, light switches, and countertops) in your "sick room" and bathroom. In shared spaces, you should clean and disinfect  surfaces and items after each use by the person who is ill. If you are sick and cannot clean, a caregiver or other person should only clean and disinfect the area around you (such as your bedroom and bathroom) on an as needed basis. Your caregiver/other person should wait as long as possible (at least several hours) and wear a mask before entering, cleaning, and disinfecting shared spaces that you use. Clean and disinfect areas that may have blood, stool, or body fluids on them. Use household cleaners and disinfectants. Clean visible dirty surfaces with household cleaners containing soap or detergent. Then, use a household disinfectant. Use a product from [employer] List N: Disinfectants for Coronavirus (CHENI-77). Be sure to follow the instructions on the label to ensure  safe and effective use of the product. Many products recommend keeping the surface wet with a disinfectant for a certain period of time (look at "contact time" on the product label). You may also need to wear personal protective equipment, such as gloves, depending on the directions on the product label. Immediately after disinfecting, wash your hands with soap and water for 20 seconds. For completed guidance on cleaning and disinfecting your home, visit Complete Disinfection Guidance. Take steps to improve ventilation at home Improve ventilation (air flow) at home to help prevent from spreading COVID-19 to other people in your household. Clear out COVID-19 virus particles in the air by opening windows, using air filters, and turning on fans in your home. Use this interactive tool to learn how to improve air flow in your home. When you can be around others after being sick with COVID-19 Deciding when you can be around others is different for different situations. Find out when you can safely end home isolation. For any additional questions about your care, contact your healthcare provider or state or local health  department. 10/27/2020 Content source: [facility] for Immunization and Respiratory Diseases ([facility]), Division of Viral Diseases This information is not intended to replace advice given to you by your health care provider. Make sure you discuss any questions you have with your health care provider. Document Revised: 12/10/2020 Document Reviewed: 12/10/2020 Elsevier Patient Education  2022 [employer].      If you have been instructed to have an in-person evaluation today at a local Urgent Care facility, please use the link below. It will take you to a list of all of our available [place] [facility], including address, phone number and hours of operation. Please do not delay care.  [facility]  If you or a family member do not have a primary care provider, use the link below to schedule a visit and establish care. When you choose a [place] primary care physician or advanced practice provider, you gain a long-term partner in health. Find a Primary Care Provider  Learn more about [facility]'s in-office and virtual care options: [facility]

## 2022-09-01 NOTE — Progress Notes (Signed)
Virtual Visit Consent   Becky Bowers, you are scheduled for a virtual visit with a [facility] provider today. Just as with appointments in the office, your consent must be obtained to participate. Your consent will be active for this visit and any virtual visit you may have with one of our providers in the next 365 days. If you have a MyChart account, a copy of this consent can be sent to you electronically.  As this is a virtual visit, video technology does not allow for your provider to perform a traditional examination. This may limit your provider's ability to fully assess your condition. If your provider identifies any concerns that need to be evaluated in person or the need to arrange testing (such as labs, EKG, etc.), we will make arrangements to do so. Although advances in technology are sophisticated, we cannot ensure that it will always work on either your end or our end. If the connection with a video visit is poor, the visit may have to be switched to a telephone visit. With either a video or telephone visit, we are not always able to ensure that we have a secure connection.  By engaging in this virtual visit, you consent to the provision of healthcare and authorize for your insurance to be billed (if applicable) for the services provided during this visit. Depending on your insurance coverage, you may receive a charge related to this service.  I need to obtain your verbal consent now. Are you willing to proceed with your visit today? Becky Bowers has provided verbal consent on 09/01/2022 for a virtual visit (video or telephone). Leeanne Rio, [place]  Date: 09/01/2022 10:24 AM  Virtual Visit via Video Note   I, Leeanne Rio, connected with  Becky Bowers  ([id number], 25-Oct-1955) on 09/01/22 at 10:15 AM EST by a video-enabled telemedicine application and verified that I am speaking with the correct person using two identifiers.  Location: Patient: Virtual Visit  Location Patient: Home Provider: Virtual Visit Location Provider: Home Office   I discussed the limitations of evaluation and management by telemedicine and the availability of in person appointments. The patient expressed understanding and agreed to proceed.    History of Present Illness: Becky Bowers is a 67 y.o. who identifies as a female who was assigned female at birth, and is being seen today for COVID-19. Endorses symptoms starting Tuesday with fatigue/sluggish, scratchy throat, ear pressure. Now with sinus congestion and pressure. Denies chest pain or SOB. Denies fever. Denies GI symptoms. Has tested positive for COVID on two home tests. OTC -- Alka Seltzer Cold. Has had COVID before back in 2022. No history of hospitalization.   HPI: HPI  Problems:  Patient Active Problem List   Diagnosis Date Noted   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   Elevated LDL cholesterol level 04/14/2021   Polyarthralgia 03/17/2021   Concussion 09/12/2019   Primary osteoarthritis right first [facility] and right second MCP 04/24/2018   Numbness of right third and fourth toes 03/19/2018   Primary osteoarthritis of both hips 12/08/2015   Headache 08/12/2010   Lumbar degenerative disc disease 01/07/2008    Allergies: No Known Allergies Medications:  Current Outpatient Medications:    benzonatate (TESSALON) 100 MG capsule, Take 1 capsule (100 mg total) by mouth 3 (three) times daily as needed for cough., Disp: 30 capsule, Rfl: 0   Azelastine-Fluticasone 137-50 MCG/ACT SUSP, Place 1 Act into the nose 2 (two) times daily as needed., Disp: ,  Rfl:    Calcium Carbonate-Vitamin D 600-200 MG-UNIT TABS, Take 1 tablet by mouth 2 (two) times daily., Disp: , Rfl:    celecoxib (CELEBREX) 200 MG capsule, One to 2 tablets by mouth daily as needed for pain., Disp: 180 capsule, Rfl: 2   estradiol (ESTRACE VAGINAL) 0.1 MG/GM vaginal cream, Place 2 g vaginally daily., Disp: 42.5 g, Rfl: 2   Estradiol 10 MCG  TABS vaginal tablet, 1 tablet per vagina daily, Disp: 30 tablet, Rfl: 6   Multiple Vitamins-Minerals (ONE-A-DAY WOMENS 50+ ADVANTAGE PO), Take 1 capsule by mouth daily., Disp: , Rfl:    valACYclovir (VALTREX) 1000 MG tablet, Take 1 tablet (1,000 mg total) by mouth as needed., Disp: 30 tablet, Rfl: 3  Observations/Objective: Patient is well-developed, well-nourished in no acute distress.  Resting comfortably at home.  Head is normocephalic, atraumatic.  No labored breathing. Speech is clear and coherent with logical content.  Patient is alert and oriented at baseline.   Assessment and Plan: 1. COVID-19 - benzonatate (TESSALON) 100 MG capsule; Take 1 capsule (100 mg total) by mouth 3 (three) times daily as needed for cough.  Dispense: 30 capsule; Refill: 0  Patient with multiple risk factors for complicated course of illness. Discussed risks/benefits of antiviral medications including most common potential ADRs. Patient voiced understanding and would like to proceed with antiviral medication. They are candidate for Molnupiravir. Rx sent to pharmacy. Supportive measures, OTC medications and vitamin regimen reviewed. Tessalon per orders. Quarantine reviewed in detail. Strict ER precautions discussed with patient.    Follow Up Instructions: I discussed the assessment and treatment plan with the patient. The patient was provided an opportunity to ask questions and all were answered. The patient agreed with the plan and demonstrated an understanding of the instructions.  A copy of instructions were sent to the patient via MyChart unless otherwise noted below.   The patient was advised to call back or seek an in-person evaluation if the symptoms worsen or if the condition fails to improve as anticipated.  Time:  I spent 10 minutes with the patient via telehealth technology discussing the above problems/concerns.    Leeanne Rio, PA-C

## 2022-09-02 ENCOUNTER — Encounter: Payer: Self-pay | Admitting: Family Medicine

## 2022-09-04 DIAGNOSIS — U071 COVID-19: Secondary | ICD-10-CM | POA: Diagnosis not present

## 2022-09-04 DIAGNOSIS — H938X3 Other specified disorders of ear, bilateral: Secondary | ICD-10-CM | POA: Diagnosis not present

## 2022-09-05 NOTE — Telephone Encounter (Signed)
Nasal saline irrigation

## 2022-09-16 ENCOUNTER — Encounter: Payer: Self-pay | Admitting: Sports Medicine

## 2022-09-19 ENCOUNTER — Encounter: Payer: Self-pay | Admitting: Sports Medicine

## 2022-09-19 ENCOUNTER — Encounter: Payer: Self-pay | Admitting: Family Medicine

## 2022-09-23 DIAGNOSIS — F4320 Adjustment disorder, unspecified: Secondary | ICD-10-CM | POA: Diagnosis not present

## 2022-10-03 ENCOUNTER — Ambulatory Visit (INDEPENDENT_AMBULATORY_CARE_PROVIDER_SITE_OTHER): Payer: Medicare PPO | Admitting: Family Medicine

## 2022-10-03 ENCOUNTER — Encounter: Payer: Self-pay | Admitting: Family Medicine

## 2022-10-03 VITALS — BP 96/55 | HR 65 | Resp 18 | Ht 64.0 in | Wt 120.2 lb

## 2022-10-03 DIAGNOSIS — Z0000 Encounter for general adult medical examination without abnormal findings: Secondary | ICD-10-CM

## 2022-10-03 DIAGNOSIS — E7800 Pure hypercholesterolemia, unspecified: Secondary | ICD-10-CM | POA: Diagnosis not present

## 2022-10-03 NOTE — Progress Notes (Signed)
Complete physical exam  Patient: Becky Bowers   DOB: October 20, 1955   67 y.o. Female  MRN: [id number]  Subjective:    Chief Complaint  Patient presents with   Annual Exam    MADHU CROTTEAU is a 67 y.o. female who presents today for a complete physical exam. She reports consuming a general diet. She generally feels well. . She does not have additional problems to discuss today.   BPs are running lower than usual since she had COVDI.  She and her husband both had COVID in January.  She tested +2 days before she was supposed to leave to go on a trip to [place].  And they had to cancel the trip.  She has scheduled her mammogram.  She is up-to-date on her Pap smear.  Most recent fall risk assessment:    10/03/2022   10:08 AM  Fall Risk   Falls in the past year? 0  Number falls in past yr: 0  Injury with Fall? 0  Risk for fall due to : No Fall Risks  Follow up Falls evaluation completed     Most recent depression screenings:    10/03/2022   10:08 AM 10/03/2022    9:59 AM  PHQ 2/9 Scores  PHQ - 2 Score 0 0        Patient Care Team: Hali Marry, MD as PCP - General   Outpatient Medications Prior to Visit  Medication Sig   Azelastine-Fluticasone 137-50 MCG/ACT SUSP Place 1 Act into the nose 2 (two) times daily as needed.   Calcium Carbonate-Vitamin D 600-200 MG-UNIT TABS Take 1 tablet by mouth 2 (two) times daily.   celecoxib (CELEBREX) 200 MG capsule One to 2 tablets by mouth daily as needed for pain.   Estradiol 10 MCG TABS vaginal tablet 1 tablet per vagina daily   Multiple Vitamins-Minerals (B COMPLEX-C-E-ZINC) tablet Take 1 tablet by mouth daily.   Multiple Vitamins-Minerals (ONE-A-DAY WOMENS 50+ ADVANTAGE PO) Take 1 capsule by mouth daily.   valACYclovir (VALTREX) 1000 MG tablet Take 1 tablet (1,000 mg total) by mouth as needed.   [DISCONTINUED] benzonatate (TESSALON) 100 MG capsule Take 1 capsule (100 mg total) by mouth 3 (three) times daily as needed for  cough.   [DISCONTINUED] estradiol (ESTRACE VAGINAL) 0.1 MG/GM vaginal cream Place 2 g vaginally daily.   No facility-administered medications prior to visit.    ROS        Objective:     BP (!) 96/55   Pulse 65   Resp 18   Ht '5\' 4"'$  (1.626 m)   Wt 120 lb 3.4 oz (54.5 kg)   SpO2 100%   BMI 20.63 kg/m    Physical Exam Vitals and nursing note reviewed.  Constitutional:      Appearance: Normal appearance. She is well-developed and normal weight.  HENT:     Head: Normocephalic and atraumatic.     Right Ear: Tympanic membrane, ear canal and external ear normal.     Left Ear: Tympanic membrane, ear canal and external ear normal.     Nose: Nose normal.     Mouth/Throat:     Pharynx: Oropharynx is clear.  Eyes:     Conjunctiva/sclera: Conjunctivae normal.     Pupils: Pupils are equal, round, and reactive to light.  Neck:     Thyroid: No thyromegaly.  Cardiovascular:     Rate and Rhythm: Normal rate and regular rhythm.     Heart sounds: Normal heart sounds.  Pulmonary:     Effort: Pulmonary effort is normal.     Breath sounds: Normal breath sounds. No wheezing.  Abdominal:     General: Bowel sounds are normal.     Palpations: Abdomen is soft.     Tenderness: There is no abdominal tenderness.  Musculoskeletal:     Cervical back: Neck supple.  Lymphadenopathy:     Cervical: No cervical adenopathy.  Skin:    General: Skin is warm and dry.     Coloration: Skin is not pale.  Neurological:     Mental Status: She is alert and oriented to person, place, and time.  Psychiatric:        Mood and Affect: Mood normal.        Behavior: Behavior normal.      No results found for any visits on 10/03/22.     Assessment & Plan:    Routine Health Maintenance and Physical Exam  Immunization History  Administered Date(s) Administered   Influenza Split 05/08/2012   Influenza, High Dose Seasonal PF 05/09/2022   Influenza,inj,Quad PF,6+ Mos 05/09/2014    Influenza-Unspecified 05/15/2015, 05/24/2016, 05/12/2017, 05/11/2018, 05/15/2019, 05/04/2020, 05/21/2021   Janssen (J&J) SARS-COV-2 Vaccination 10/11/2019   Moderna SARS-COV2 Booster Vaccination 07/13/2021, 05/09/2022   PFIZER Comirnaty(Gray Top)Covid-19 Tri-Sucrose Vaccine 01/12/2021   PFIZER(Purple Top)SARS-COV-2 Vaccination 06/04/2020   PNEUMOCOCCAL CONJUGATE-20 02/03/2022   Rsv, Bivalent, Protein Subunit Rsvpref,pf Evans Lance) 04/19/2022   Td 02/18/2009   Tdap 02/06/2011, 09/03/2020   Zoster Recombinat (Shingrix) 07/13/2017, 10/23/2017    Health Maintenance  Topic Date Due   COVID-19 Vaccine (6 - 2023-24 season) 04/03/2023 (Originally 07/04/2022)   Medicare Annual Wellness (AWV)  05/06/2023   MAMMOGRAM  09/30/2023   PAP SMEAR-Modifier  09/06/2024   COLONOSCOPY (Pts 45-69yr Insurance coverage will need to be confirmed)  08/15/2029   DTaP/Tdap/Td (4 - Td or Tdap) 09/03/2030   Pneumonia Vaccine 66 Years old  Completed   INFLUENZA VACCINE  Completed   DEXA SCAN  Completed   Hepatitis C Screening  Completed   HIV Screening  Completed   Zoster Vaccines- Shingrix  Completed   HPV VACCINES  Aged Out    Discussed health benefits of physical activity, and encouraged her to engage in regular exercise appropriate for her age and condition.  Problem List Items Addressed This Visit   None Visit Diagnoses     Wellness examination    -  Primary   Relevant Orders   COMPLETE METABOLIC PANEL WITH GFR   Lipid Panel w/reflex Direct LDL   CBC       Keep up a regular exercise program and make sure you are eating a healthy diet Try to eat 4 servings of dairy a day, or if you are lactose intolerant take a calcium with vitamin D daily.  Your vaccines are up to date.   Pneumonia vaccine updated in immunization records.  Return in about 1 year (around 10/04/2023) for wellness exam.     CBeatrice Lecher MD

## 2022-10-04 LAB — LIPID PANEL W/REFLEX DIRECT LDL
Cholesterol: 193 mg/dL (ref <200)
HDL: 93 mg/dL (ref >=50)
LDL Cholesterol (Calc): 86 mg/dL (calc)
Non-HDL Cholesterol (Calc): 100 mg/dL (calc) (ref <130)
Total CHOL/HDL Ratio: 2.1 (calc) (ref <5.0)
Triglycerides: 56 mg/dL (ref <150)

## 2022-10-04 LAB — CBC
HCT: 40.1 % (ref 35.0–45.0)
Hemoglobin: 13.2 g/dL (ref 11.7–15.5)
MCH: 29.7 pg (ref 27.0–33.0)
MCHC: 32.9 g/dL (ref 32.0–36.0)
MCV: 90.3 fL (ref 80.0–100.0)
MPV: 11.8 fL (ref 7.5–12.5)
Platelets: 198 10*3/uL (ref 140–400)
RBC: 4.44 10*6/uL (ref 3.80–5.10)
RDW: 11.7 % (ref 11.0–15.0)
WBC: 5.4 10*3/uL (ref 3.8–10.8)

## 2022-10-04 LAB — COMPLETE METABOLIC PANEL WITH GFR
AG Ratio: 2.1 (calc) (ref 1.0–2.5)
ALT: 6 U/L (ref 6–29)
AST: 21 U/L (ref 10–35)
Albumin: 4.7 g/dL (ref 3.6–5.1)
Alkaline phosphatase (APISO): 60 U/L (ref 37–153)
BUN: 24 mg/dL (ref 7–25)
CO2: 30 mmol/L (ref 20–32)
Calcium: 9.4 mg/dL (ref 8.6–10.4)
Chloride: 105 mmol/L (ref 98–110)
Creat: 0.74 mg/dL (ref 0.50–1.05)
Globulin: 2.2 g/dL (calc) (ref 1.9–3.7)
Glucose, Bld: 90 mg/dL (ref 65–99)
Potassium: 4.3 mmol/L (ref 3.5–5.3)
Sodium: 143 mmol/L (ref 135–146)
Total Bilirubin: 0.4 mg/dL (ref 0.2–1.2)
Total Protein: 6.9 g/dL (ref 6.1–8.1)
eGFR: 90 mL/min/{1.73_m2} (ref >=60)

## 2022-10-04 NOTE — Progress Notes (Signed)
Your lab work is within acceptable range and there are no concerning findings.   ?

## 2022-10-05 ENCOUNTER — Ambulatory Visit (INDEPENDENT_AMBULATORY_CARE_PROVIDER_SITE_OTHER): Payer: Medicare PPO

## 2022-10-05 DIAGNOSIS — Z1231 Encounter for screening mammogram for malignant neoplasm of breast: Secondary | ICD-10-CM

## 2022-10-07 NOTE — Progress Notes (Signed)
Please call patient. Normal mammogram.  Repeat in 1 year.  

## 2022-12-27 ENCOUNTER — Ambulatory Visit
Admission: EM | Admit: 2022-12-27 | Discharge: 2022-12-27 | Disposition: A | Discharge status: Home / Self Care | Payer: Medicare PPO | Attending: Emergency Medicine | Admitting: Emergency Medicine

## 2022-12-27 DIAGNOSIS — S39012A Strain of muscle, fascia and tendon of lower back, initial encounter: Secondary | ICD-10-CM | POA: Diagnosis not present

## 2022-12-27 DIAGNOSIS — H8193 Unspecified disorder of vestibular function, bilateral: Secondary | ICD-10-CM | POA: Diagnosis not present

## 2022-12-27 MED ORDER — PSEUDOEPHEDRINE HCL 30 MG PO TABS: 60.0000 mg | ORAL_TABLET | Freq: Three times a day (TID) | ORAL | 0 refills | Status: AC | PRN

## 2022-12-27 MED ORDER — LEVOCETIRIZINE DIHYDROCHLORIDE 5 MG PO TABS: 5.0000 mg | ORAL_TABLET | Freq: Every evening | ORAL | 1 refills | Status: DC

## 2022-12-27 MED ORDER — BACLOFEN 10 MG PO TABS: 10.0000 mg | ORAL_TABLET | Freq: Every day | ORAL | 0 refills | Status: AC

## 2022-12-27 MED ORDER — AZELASTINE-FLUTICASONE 137-50 MCG/ACT NA SUSP: 1.0000 | Freq: Two times a day (BID) | NASAL | 0 refills | Status: DC | PRN

## 2022-12-27 NOTE — ED Provider Notes (Signed)
[facility]    CSN: [id number] Arrival date & time: 12/27/22  1603    HISTORY   Chief Complaint  Patient presents with   Ear Fullness   HPI Becky Bowers is a pleasant, 67 y.o. female who presents to urgent care today. Patient complains of 1 week of ears feeling stopped up and now feeling dizzy.  Patient states he does not have any ear pain at this time.  Patient endorses a history of allergies, taking an antihistamine intermittently.  Patient denies fever, nausea, body aches, chills, known sick contacts, recent upper respiratory infection, hearing loss.  Patient states she had an episode of dizziness so intense that she fell forward catching herself on her hands and now has a strain of her lower back.  The history is provided by the patient.   Past Medical History:  Diagnosis Date   Arthritis    Patient Active Problem List   Diagnosis Date Noted   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   Elevated LDL cholesterol level 04/14/2021   Polyarthralgia 03/17/2021   Concussion 09/12/2019   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 04/24/2018   Numbness of right third and fourth toes 03/19/2018   Primary osteoarthritis of both hips 12/08/2015   Headache 08/12/2010   Lumbar degenerative disc disease 01/07/2008   Past Surgical History:  Procedure Laterality Date   APPENDECTOMY  1968   DILATION AND CURETTAGE OF UTERUS  1990   TONSILLECTOMY  1980   OB History     Gravida  2   Para  1   Term      Preterm      AB  1   Living  1      SAB  1   IAB      Ectopic      Multiple      Live Births             Home Medications    Prior to Admission medications   Medication Sig Start Date End Date Taking? Authorizing Provider  Azelastine-Fluticasone 137-50 MCG/ACT SUSP Place 1 Act into the nose 2 (two) times daily as needed.    Christen Butter, NP  Calcium Carbonate-Vitamin D 600-200 MG-UNIT TABS Take 1 tablet by mouth 2  (two) times daily.    [provider]  celecoxib (CELEBREX) 200 MG capsule One to 2 tablets by mouth daily as needed for pain. 07/22/22   Monica Becton, MD  Estradiol 10 MCG TABS vaginal tablet 1 tablet per vagina daily 05/26/22   Constant, Peggy, MD  Multiple Vitamins-Minerals (B COMPLEX-C-E-ZINC) tablet Take 1 tablet by mouth daily.    [provider]  Multiple Vitamins-Minerals (ONE-A-DAY WOMENS 50+ ADVANTAGE PO) Take 1 capsule by mouth daily.    [provider]  valACYclovir (VALTREX) 1000 MG tablet Take 1 tablet (1,000 mg total) by mouth as needed. 09/03/20   Conan Bowens, MD    Family History Family History  Problem Relation Age of Onset   Heart attack Mother    Diabetes Father    Hypertension Father    Squamous cell carcinoma Father        skin    Atrial fibrillation Father    Hearing loss Father    Dementia Father    Diabetes Brother    Hypertension Brother    Colon cancer Paternal Grandmother    Alzheimer's disease Paternal Grandmother    Alzheimer's disease Paternal Uncle    Social  History Social History   Tobacco Use   Smoking status: Never   Smokeless tobacco: Never  Vaping Use   Vaping Use: Never used  Substance Use Topics   Alcohol use: Yes    Alcohol/week: 2.0 - 4.0 standard drinks of alcohol    Types: 2 - 4 Glasses of wine per week    Comment: 4-6 glasses of wine a week   Drug use: No   Allergies   Patient has no known allergies.  Review of Systems Review of Systems Pertinent findings revealed after performing a 14 point review of systems has been noted in the history of present illness.  Physical Exam Vital Signs BP 126/73   Pulse 65   Temp 97.9 F (36.6 C)   Resp 20   SpO2 98%   No data found.  Physical Exam Vitals and nursing note reviewed.  Constitutional:      General: She is not in acute distress.    Appearance: Normal appearance. She is not ill-appearing.  HENT:     Head: Normocephalic and  atraumatic.     Salivary Glands: Right salivary gland is not diffusely enlarged or tender. Left salivary gland is not diffusely enlarged or tender.     Right Ear: Ear canal and external ear normal. No drainage. A middle ear effusion is present. There is no impacted cerumen. Tympanic membrane is bulging. Tympanic membrane is not injected or erythematous.     Left Ear: Ear canal and external ear normal. No drainage. A middle ear effusion is present. There is no impacted cerumen. Tympanic membrane is bulging. Tympanic membrane is not injected or erythematous.     Ears:     Comments: Bilateral EACs normal, both TMs bulging with clear fluid    Nose: Rhinorrhea present. No nasal deformity, septal deviation, signs of injury, nasal tenderness, mucosal edema or congestion. Rhinorrhea is clear.     Right Nostril: Occlusion present. No foreign body, epistaxis or septal hematoma.     Left Nostril: Occlusion present. No foreign body, epistaxis or septal hematoma.     Right Turbinates: Enlarged, swollen and pale.     Left Turbinates: Enlarged, swollen and pale.     Right Sinus: No maxillary sinus tenderness or frontal sinus tenderness.     Left Sinus: No maxillary sinus tenderness or frontal sinus tenderness.     Mouth/Throat:     Lips: Pink. No lesions.     Mouth: Mucous membranes are moist. No oral lesions.     Pharynx: Oropharynx is clear. Uvula midline. No posterior oropharyngeal erythema or uvula swelling.     Tonsils: No tonsillar exudate. 0 on the right. 0 on the left.     Comments: Postnasal drip Eyes:     General: Lids are normal.        Right eye: No discharge.        Left eye: No discharge.     Extraocular Movements: Extraocular movements intact.     Conjunctiva/sclera: Conjunctivae normal.     Right eye: Right conjunctiva is not injected.     Left eye: Left conjunctiva is not injected.  Neck:     Trachea: Trachea and phonation normal.  Cardiovascular:     Rate and Rhythm: Normal rate and  regular rhythm.     Pulses: Normal pulses.     Heart sounds: Normal heart sounds. No murmur heard.    No friction rub. No gallop.  Pulmonary:     Effort: Pulmonary effort is normal. No accessory  muscle usage, prolonged expiration or respiratory distress.     Breath sounds: Normal breath sounds. No stridor, decreased air movement or transmitted upper airway sounds. No decreased breath sounds, wheezing, rhonchi or rales.  Chest:     Chest wall: No tenderness.  Musculoskeletal:        General: Normal range of motion.     Cervical back: Normal range of motion and neck supple. Normal range of motion.     Lumbar back: Swelling, spasms and tenderness present. No deformity or bony tenderness. Normal range of motion. Negative right straight leg raise test and negative left straight leg raise test. No scoliosis.  Lymphadenopathy:     Cervical: No cervical adenopathy.  Skin:    General: Skin is warm and dry.     Findings: No erythema or rash.  Neurological:     General: No focal deficit present.     Mental Status: She is alert and oriented to person, place, and time. Mental status is at baseline.     Cranial Nerves: Cranial nerves 2-12 are intact.     Sensory: Sensation is intact.     Motor: Motor function is intact.     Coordination: Coordination is intact.     Gait: Gait is intact.     Deep Tendon Reflexes: Reflexes are normal and symmetric.  Psychiatric:        Mood and Affect: Mood normal.        Behavior: Behavior normal.     Visual Acuity Right Eye Distance:   Left Eye Distance:   Bilateral Distance:    Right Eye Near:   Left Eye Near:    Bilateral Near:     UC Couse / Diagnostics / Procedures:     Radiology No results found.  Procedures Procedures (including critical care time) EKG  Pending results:  Labs Reviewed - No data to display  Medications Ordered in UC: Medications - No data to display  UC Diagnoses / Final Clinical Impressions(s)   I have reviewed the  triage vital signs and the nursing notes.  Pertinent labs & imaging results that were available during my care of the patient were reviewed by me and considered in my medical decision making (see chart for details).    Final diagnoses:  Strain of fascia of lower back  Vestibulopathy of both ears   Patient provided with a prescription for baclofen for muscle spasm of lumbar paraspinous muscles.  Patient provided with a prescription for Dymista, Xyzal and Sudafed for relief of what appears to be related to vestibulocochlear dysfunction.  Patient advised to follow-up with her ear nose and throat provider with whom she is already established.  Please see discharge instructions below for details of plan of care as provided to patient. ED Prescriptions     Medication Sig Dispense Auth. Provider   Azelastine-Fluticasone 137-50 MCG/ACT SUSP Place 1 Act into the nose 2 (two) times daily as needed. 23 g Theadora Rama Scales, PA-C   baclofen (LIORESAL) 10 MG tablet Take 1 tablet (10 mg total) by mouth at bedtime for 7 days. 7 tablet Theadora Rama Scales, PA-C   levocetirizine (XYZAL) 5 MG tablet Take 1 tablet (5 mg total) by mouth every evening. 90 tablet Theadora Rama Scales, PA-C   pseudoephedrine (SUDAFED) 30 MG tablet Take 2 tablets (60 mg total) by mouth every 8 (eight) hours as needed for up to 3 days for congestion. 18 tablet Theadora Rama Scales, PA-C      PDMP not reviewed  this encounter.  Pending results:  Labs Reviewed - No data to display  Discharge Instructions:   Discharge Instructions      At this time I am not able to diagnose you with Mnire's disease but did include some information about this disease because it is similar to vestibulopathy and signs and symptoms as well as treatment modalities.  I do recommend you follow-up with your ear nose and throat specialist regarding this issue.  In the meantime, you are welcome to continue using Dymista nasal spray, begin  Xyzal antihistamine tablet and take pseudoephedrine every 8 hours as needed for up to 3 days to help reduce your symptoms of dizziness and fullness in your ears.  For the strain to your lower back on the right side, recommend that you continue ibuprofen and ice to your lower back and add 1 tablet of baclofen at bedtime every night to help reduce muscle spasm and tension often caused by pain which will also increase her pain.  Thank you for visiting [facility] today.  We appreciate the opportunity to participate in your care.      Disposition Upon Discharge:  Condition: stable for discharge home  Patient presented with an acute illness with associated systemic symptoms and significant discomfort requiring urgent management. In my opinion, this is a condition that a prudent lay person (someone who possesses an average knowledge of health and medicine) may potentially expect to result in complications if not addressed urgently such as respiratory distress, impairment of bodily function or dysfunction of bodily organs.   Routine symptom specific, illness specific and/or disease specific instructions were discussed with the patient and/or caregiver at length.   As such, the patient has been evaluated and assessed, work-up was performed and treatment was provided in alignment with urgent care protocols and evidence based medicine.  Patient/parent/caregiver has been advised that the patient may require follow up for further testing and treatment if the symptoms continue in spite of treatment, as clinically indicated and appropriate.  Patient/parent/caregiver has been advised to return to the [facility] or PCP if no better; to PCP or the Emergency Department if new signs and symptoms develop, or if the current signs or symptoms continue to change or worsen for further workup, evaluation and treatment as clinically indicated and appropriate  The patient will follow up with their current PCP if and  as advised. If the patient does not currently have a PCP we will assist them in obtaining one.   The patient may need specialty follow up if the symptoms continue, in spite of conservative treatment and management, for further workup, evaluation, consultation and treatment as clinically indicated and appropriate.  Patient/parent/caregiver verbalized understanding and agreement of plan as discussed.  All questions were addressed during visit.  Please see discharge instructions below for further details of plan.  This office note has been dictated using Teaching laboratory technician.  Unfortunately, this method of dictation can sometimes lead to typographical or grammatical errors.  I apologize for your inconvenience in advance if this occurs.  Please do not hesitate to reach out to me if clarification is needed.      Theadora Rama Scales, [place] 12/28/22 1806

## 2022-12-27 NOTE — Discharge Instructions (Signed)
At this time I am not able to diagnose you with Mnire's disease but did include some information about this disease because it is similar to vestibulopathy and signs and symptoms as well as treatment modalities.  I do recommend you follow-up with your ear nose and throat specialist regarding this issue.  In the meantime, you are welcome to continue using Dymista nasal spray, begin Xyzal antihistamine tablet and take pseudoephedrine every 8 hours as needed for up to 3 days to help reduce your symptoms of dizziness and fullness in your ears.  For the strain to your lower back on the right side, recommend that you continue ibuprofen and ice to your lower back and add 1 tablet of baclofen at bedtime every night to help reduce muscle spasm and tension often caused by pain which will also increase her pain.  Thank you for visiting [facility] today.  We appreciate the opportunity to participate in your care.

## 2022-12-27 NOTE — ED Triage Notes (Signed)
Pt presents to [facility] with co of stopped up ears for one week with new onset of dizziness. Pt denies any pain in the ears.

## 2023-01-06 ENCOUNTER — Ambulatory Visit (INDEPENDENT_AMBULATORY_CARE_PROVIDER_SITE_OTHER): Payer: Medicare PPO | Admitting: Family Medicine

## 2023-01-06 ENCOUNTER — Encounter: Payer: Self-pay | Admitting: Family Medicine

## 2023-01-06 VITALS — BP 110/57 | HR 59 | Ht 64.0 in | Wt 120.0 lb

## 2023-01-06 DIAGNOSIS — S76301A Unspecified injury of muscle, fascia and tendon of the posterior muscle group at thigh level, right thigh, initial encounter: Secondary | ICD-10-CM

## 2023-01-06 DIAGNOSIS — M25551 Pain in right hip: Secondary | ICD-10-CM

## 2023-01-06 DIAGNOSIS — M160 Bilateral primary osteoarthritis of hip: Secondary | ICD-10-CM | POA: Diagnosis not present

## 2023-01-06 NOTE — Progress Notes (Signed)
   Acute Office Visit  Subjective:     Patient ID: Becky Bowers, female    DOB: January 12, 1956, 67 y.o.   MRN: [id number]  Chief Complaint  Patient presents with   Hip Pain    HPI Patient is in today for right posterior hip pain.  She says the pain started about 3 weeks ago around Mother's Day.  She felt like her ears were clogged she is a little dizzy and lightheaded and felt like she was going to fall so she stumbled and felt a popping sensation in her right buttock upper thigh area posteriorly.  Since then it has been painful and bothersome.  Sometimes the pain radiates all the way down the hamstring to the back of her knee.  She has tried some ice.  She also tried a muscle relaxer provide about a week but says it really was not helpful.  Last night she was reaching to get a water bottle and felt that same pop and had so much pain last night that she tried putting a TENS unit on it.  She does still have some Celebrex at home.  She is still trying to exercise and has been walking 2 miles a day instead of her usual 3 miles a day.  And she did skip her Pilates class today. X-rays performed in December 2023 showing arthritis in both hips and some mild bone spurring.  She followed up with sports medicine and was improving in January.  ROS      Objective:    BP (!) 110/57   Pulse (!) 59   Ht 5\' 4"  (1.626 m)   Wt 120 lb (54.4 kg)   SpO2 100%   BMI 20.60 kg/m    Physical Exam  No results found for any visits on 01/06/23.      Assessment & Plan:   Problem List Items Addressed This Visit       Musculoskeletal and Integument   Primary osteoarthritis of both hips - Primary   Relevant Orders   MR HIP RIGHT WO CONTRAST   Other Visit Diagnoses     Right hamstring injury, initial encounter       Relevant Orders   MR HIP RIGHT WO CONTRAST   Right hip pain       Relevant Orders   MR HIP RIGHT WO CONTRAST      She has known OA in that right hip and has had problems on and off  for over a year but this pain was more abrupt and slightly posteriorly at the more proximal end of the hamstring.  I suspect probably a tear of the muscle or tendon.  Work on gentle stretches, heat or ice since it has been 3 weeks at this point I would like to go ahead and move forward with an MRI for further workup.  He has some Celebrex at home and so we will try that.  Muscle relaxers really were not helpful.  No orders of the defined types were placed in this encounter.   No follow-ups on file.  Nani Gasser, MD

## 2023-01-11 ENCOUNTER — Encounter: Payer: Self-pay | Admitting: Family Medicine

## 2023-01-17 DIAGNOSIS — L299 Pruritus, unspecified: Secondary | ICD-10-CM | POA: Diagnosis not present

## 2023-01-17 DIAGNOSIS — L821 Other seborrheic keratosis: Secondary | ICD-10-CM | POA: Diagnosis not present

## 2023-01-17 DIAGNOSIS — L579 Skin changes due to chronic exposure to nonionizing radiation, unspecified: Secondary | ICD-10-CM | POA: Diagnosis not present

## 2023-01-17 DIAGNOSIS — L820 Inflamed seborrheic keratosis: Secondary | ICD-10-CM | POA: Diagnosis not present

## 2023-01-21 ENCOUNTER — Ambulatory Visit (INDEPENDENT_AMBULATORY_CARE_PROVIDER_SITE_OTHER): Payer: Medicare PPO

## 2023-01-21 DIAGNOSIS — M160 Bilateral primary osteoarthritis of hip: Secondary | ICD-10-CM

## 2023-01-21 DIAGNOSIS — M25551 Pain in right hip: Secondary | ICD-10-CM | POA: Diagnosis not present

## 2023-01-21 DIAGNOSIS — S76301A Unspecified injury of muscle, fascia and tendon of the posterior muscle group at thigh level, right thigh, initial encounter: Secondary | ICD-10-CM | POA: Diagnosis not present

## 2023-01-21 DIAGNOSIS — M1611 Unilateral primary osteoarthritis, right hip: Secondary | ICD-10-CM | POA: Diagnosis not present

## 2023-01-31 NOTE — Telephone Encounter (Signed)
Patient scheduled with Dr. Benjamin Stain for 02/03/23 at 9:15am=kph

## 2023-01-31 NOTE — Telephone Encounter (Signed)
Go ahead and get her scheduled with Dr. Karie Schwalbe and then if he feels that she needs another type of care he can place a referral.

## 2023-01-31 NOTE — Progress Notes (Signed)
Hi Becky Bowers, MRI shows that your right hamstring has a full-thickness tear and some swelling.  You have some tendinosis on the left hamstring as well but no tear.  Little bit of excess fluid in the right ischial bursa.  Indicating a little bit of bursitis.  To have some mild tendinosis in your bilateral gluteus medius and minimus tendons.  And a little arthritis in that right hip.  Neck step, I would like to get you in with an orthopedist to discuss next steps.  If you are okay with that please let me know and if you have a preference for provider or location then please let us know.

## 2023-02-03 ENCOUNTER — Ambulatory Visit: Payer: Medicare PPO | Admitting: Sports Medicine

## 2023-02-03 ENCOUNTER — Encounter: Payer: Self-pay | Admitting: Sports Medicine

## 2023-02-03 DIAGNOSIS — S76311A Strain of muscle, fascia and tendon of the posterior muscle group at thigh level, right thigh, initial encounter: Secondary | ICD-10-CM | POA: Diagnosis not present

## 2023-02-03 NOTE — Assessment & Plan Note (Signed)
Pleasant 67 year old female, approximately 6 weeks ago she took a misstep and felt a pop right buttock with pain. Ultimately had an MRI that showed some tearing of the biceps femoris from the ischial tuberosity with an ischiogluteal bursitis. She is referred to me for further evaluation and definitive treatment. She is doing okay, she has fairly good strength to knee flexion, no palpable defects. I do think that we need to give this some time and conservative treatment. We we will do a thigh compression wrap, she will hold off on medications for now. I would like her to do some physical therapy, I have advised her to minimize stretching, and let PT work initially on passive and active range of motion, eccentric strengthening. Return to see me in 6 weeks, she does understand this will take a couple of months to get better. If insufficient improvement at the 6-week follow-up we will consider an ischiogluteal bursa injection.

## 2023-02-03 NOTE — Progress Notes (Signed)
    Procedures performed today:    None.  Independent interpretation of notes and tests performed by another provider:   MRI personally reviewed, I do see an ischiogluteal bursitis with partial tearing of the biceps femoris with some retraction.  Brief History, Exam, Impression, and Recommendations:    Tear of right hamstring Pleasant 67 year old female, approximately 6 weeks ago she took a misstep and felt a pop right buttock with pain. Ultimately had an MRI that showed some tearing of the biceps femoris from the ischial tuberosity with an ischiogluteal bursitis. She is referred to me for further evaluation and definitive treatment. She is doing okay, she has fairly good strength to knee flexion, no palpable defects. I do think that we need to give this some time and conservative treatment. We we will do a thigh compression wrap, she will hold off on medications for now. I would like her to do some physical therapy, I have advised her to minimize stretching, and let PT work initially on passive and active range of motion, eccentric strengthening. Return to see me in 6 weeks, she does understand this will take a couple of months to get better. If insufficient improvement at the 6-week follow-up we will consider an ischiogluteal bursa injection.    ____________________________________________ Ihor Austin. Benjamin Stain, M.D., ABFM., CAQSM., AME. Primary Care and Sports Medicine [facility]  Adjunct Professor of Family Medicine  [place] [employer]  Restaurant manager, fast food

## 2023-02-07 ENCOUNTER — Other Ambulatory Visit: Payer: Self-pay

## 2023-02-07 ENCOUNTER — Ambulatory Visit: Payer: Medicare PPO | Attending: Sports Medicine | Admitting: Physical Therapy

## 2023-02-07 DIAGNOSIS — X58XXXD Exposure to other specified factors, subsequent encounter: Secondary | ICD-10-CM | POA: Diagnosis not present

## 2023-02-07 DIAGNOSIS — M25551 Pain in right hip: Secondary | ICD-10-CM | POA: Insufficient documentation

## 2023-02-07 DIAGNOSIS — R2689 Other abnormalities of gait and mobility: Secondary | ICD-10-CM | POA: Diagnosis not present

## 2023-02-07 DIAGNOSIS — S76311D Strain of muscle, fascia and tendon of the posterior muscle group at thigh level, right thigh, subsequent encounter: Secondary | ICD-10-CM | POA: Insufficient documentation

## 2023-02-07 DIAGNOSIS — M6281 Muscle weakness (generalized): Secondary | ICD-10-CM | POA: Insufficient documentation

## 2023-02-07 DIAGNOSIS — M25651 Stiffness of right hip, not elsewhere classified: Secondary | ICD-10-CM | POA: Diagnosis not present

## 2023-02-07 NOTE — Therapy (Signed)
OUTPATIENT PHYSICAL THERAPY LOWER EXTREMITY EVALUATION   Patient Name: Becky Bowers MRN: [id number] DOB:12-Jun-1956, 67 y.o., female Today's Date: 02/07/2023  END OF SESSION:  PT End of Session - 02/07/23 1142     Visit Number 1    Date for PT Re-Evaluation 04/04/23    Authorization Type [employer]    PT Start Time 1145    PT Stop Time 1230    PT Time Calculation (min) 45 min    Activity Tolerance Patient tolerated treatment well             Past Medical History:  Diagnosis Date   Arthritis    Concussion 09/12/2019   Past Surgical History:  Procedure Laterality Date   APPENDECTOMY  1968   DILATION AND CURETTAGE OF UTERUS  1990   TONSILLECTOMY  1980   Patient Active Problem List   Diagnosis Date Noted   Tear of right hamstring 02/03/2023   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   Elevated LDL cholesterol level 04/14/2021   Polyarthralgia 03/17/2021   Primary osteoarthritis right first [facility] and right second MCP 04/24/2018   Numbness of right third and fourth toes 03/19/2018   Primary osteoarthritis of both hips 12/08/2015   Headache 08/12/2010   Lumbar degenerative disc disease 01/07/2008    PCP: Agapito Games, MD  REFERRING PROVIDER: Monica Becton, MD  REFERRING DIAG: [phone] (ICD-10-CM) - Tear of right hamstring  THERAPY DIAG:  Pain in right hip  Stiffness of right hip, not elsewhere classified  Muscle weakness (generalized)  Other abnormalities of gait and mobility  Rationale for Evaluation and Treatment: Rehabilitation  ONSET DATE: Around May 16  SUBJECTIVE:   SUBJECTIVE STATEMENT: Pt states she had an inner ear issue and lost her balance. Pt was standing putting together a rocking chair and slipped/jerked her body and felt something pop in her R posterior hip. Inner ear stuff got cleared up but had continued R posterior hip pain. Pt was walking 3.5 miles/day (has not been walking). Does pilates normally and  stopped for now. Pain in general has been decreasing.   PERTINENT HISTORY: No history of injuries  PAIN:  Are you having pain? Yes: NPRS scale: 1 currently, at worst 3/10 Pain location: R posterior hip, radiates to groin area Pain description: dull ache Aggravating factors: Prolonged sitting, mornings getting out of bed  Relieving factors: Ice  PRECAUTIONS:  From Dr. Melvia Heaps referral notes: Patient is a right biceps femoris tear, minimal retraction, ischiogluteal bursitis, needs to work on passive range of motion for a week, active range of motion for 1 to 2 weeks, and then ECCENTRIC strengthening. Please minimize stretching at least for the first couple of weeks.  WEIGHT BEARING RESTRICTIONS: No  FALLS:  Has patient fallen in last 6 months? No  LIVING ENVIRONMENT: Lives with: lives with their spouse Lives in: House/apartment Stairs: Yes: External: 2 steps; none Has following equipment at home: None  OCCUPATION: Retired  PLOF: Independent  PATIENT GOALS: Return to all normal functions  NEXT MD VISIT: August  OBJECTIVE:   DIAGNOSTIC FINDINGS: MRI 01/21/23 IMPRESSION: 1. Right hamstring tendinosis with a full-thickness minimally retracted tear of the biceps femoris tendon origin with peritendinous edema. 2. Moderate volume fluid within the right ischiogluteal bursa. 3. Mild tendinosis of the contralateral left hamstring origin. 4. Mild tendinosis of the bilateral gluteus medius and minimus tendons without tear. 5. Mild osteoarthritis of the right hip.  PATIENT SURVEYS:  LEFS 60/80 = 75%  COGNITION: Overall cognitive  status: Within functional limits for tasks assessed     SENSATION: WFL  EDEMA:  Mild in R buttock  MUSCLE LENGTH: Hamstrings: deferred per precautions Thomas test: WNL  POSTURE:  bilat knees slightly bent  PALPATION: R glute, piriformis tenderness  LOWER EXTREMITY ROM:  Active ROM Right eval Left eval  Hip flexion    Hip extension    Hip  abduction    Hip adduction    Hip internal rotation    Hip external rotation    Knee flexion 120 135  Knee extension 0 0  Ankle dorsiflexion    Ankle plantarflexion    Ankle inversion    Ankle eversion     (Blank rows = not tested)  LOWER EXTREMITY MMT:  MMT Right eval Left eval  Hip flexion 5 5  Hip extension 3- 4  Hip abduction 4- 4  Hip adduction    Hip internal rotation deferred 4  Hip external rotation deferred 3+  Knee flexion deferred 5  Knee extension 5 5  Ankle dorsiflexion    Ankle plantarflexion    Ankle inversion    Ankle eversion     (Blank rows = not tested)  LOWER EXTREMITY SPECIAL TESTS:  Hip special tests: Luisa Hart (FABER) test: positive , Thomas test: negative, and Ely's test: negative  FUNCTIONAL TESTS:  Deferred  GAIT: Mildly antalgic on R with knee remaining slightly flexed   TODAY'S TREATMENT:                                                                                                                              DATE: 02/07/23 See HEP below    PATIENT EDUCATION:  Education details: Exam findings, POC, initial HEP Person educated: Patient Education method: Explanation, Demonstration, and Handouts Education comprehension: verbalized understanding, returned demonstration, and needs further education  HOME EXERCISE PROGRAM: Access Code: [email or web address] URL: [email or web address] Date: 02/07/2023 Prepared by: Vernon Prey April Kirstie Peri  Exercises - Modified Prone Quadriceps Stretch with Strap  - 1 x daily - 7 x weekly - 2 sets - 10 reps - Supine Figure 4  - 1 x daily - 7 x weekly - 2 sets - 10 reps - Supine Heel Slide with Strap  - 1 x daily - 7 x weekly - 2 sets - 10 reps - Supine Lower Trunk Rotation  - 1 x daily - 7 x weekly - 2 sets - 10 reps  ASSESSMENT:  CLINICAL IMPRESSION: Patient is a 67 y.o. F who was seen today for physical therapy evaluation and treatment for R hamstring tear and glute/hamstring tendinosis.  Assessment significant for weak R hip and decreased stability. Per Dr. Melvia Heaps referral, PT to start with PROM x1 week and then AROM for the next 1-2 weeks before initiating eccentric strengthening. Pt will benefit from PT to recover from her injury and return to PLOF. Initiated gentle PROM/AAROM this session for hip mobility with good pt tolerance.   OBJECTIVE IMPAIRMENTS: decreased activity  tolerance, decreased balance, decreased endurance, decreased mobility, difficulty walking, decreased ROM, decreased strength, increased edema, increased fascial restrictions, impaired flexibility, improper body mechanics, and pain.   ACTIVITY LIMITATIONS: sitting, standing, squatting, stairs, and locomotion level  PARTICIPATION LIMITATIONS: cleaning, laundry, shopping, community activity, and yard work  PERSONAL FACTORS: Fitness, Past/current experiences, and Time since onset of injury/illness/exacerbation are also affecting patient's functional outcome.   REHAB POTENTIAL: Good  CLINICAL DECISION MAKING: Stable/uncomplicated  EVALUATION COMPLEXITY: Low   GOALS: Goals reviewed with patient? Yes  SHORT TERM GOALS: Target date: 03/07/2023  Pt will be ind with initial HEP Baseline: Goal status: INITIAL  2.  Pt will demo L = R knee AROM to demo improving hamstring mobility Baseline:  Goal status: INITIAL   LONG TERM GOALS: Target date: 04/04/2023   Pt will be ind with management and progression of HEP Baseline:  Goal status: INITIAL  2.  Pt will be able to tolerate single leg stability on R LE x 30 sec Baseline:  Goal status: INITIAL  3.  Pt will be able to walk at least 3 miles without exacerbation of pain to >/=1/10 Baseline:  Goal status: INITIAL  4.  Pt will be able to squat and lift at least 10# to demo increased bilat LE strength Baseline:  Goal status: INITIAL  5.  Pt will have increased LEFS to >/=70/80 to demo MCID Baseline:  Goal status: INITIAL    PLAN:  PT FREQUENCY:  1-2x/week  PT DURATION: 8 weeks  PLANNED INTERVENTIONS: Therapeutic exercises, Therapeutic activity, Neuromuscular re-education, Balance training, Gait training, Patient/Family education, Self Care, Joint mobilization, Aquatic Therapy, Dry Needling, Electrical stimulation, Cryotherapy, Moist heat, Taping, Vasopneumatic device, Ionotophoresis 4mg /ml Dexamethasone, Manual therapy, and Re-evaluation  PLAN FOR NEXT SESSION: Assess response to HEP. Follow precautions. Initiate AROM and gentle isometrics.    Deaundra Kutzer April Ma L Giovanni Bath, PT 02/07/2023, 1:23 PM

## 2023-02-14 ENCOUNTER — Ambulatory Visit: Payer: Medicare PPO | Admitting: Physical Therapy

## 2023-02-14 ENCOUNTER — Encounter: Payer: Self-pay | Admitting: Physical Therapy

## 2023-02-14 DIAGNOSIS — M6281 Muscle weakness (generalized): Secondary | ICD-10-CM

## 2023-02-14 DIAGNOSIS — M25551 Pain in right hip: Secondary | ICD-10-CM

## 2023-02-14 DIAGNOSIS — M25651 Stiffness of right hip, not elsewhere classified: Secondary | ICD-10-CM | POA: Diagnosis not present

## 2023-02-14 DIAGNOSIS — R2689 Other abnormalities of gait and mobility: Secondary | ICD-10-CM

## 2023-02-14 DIAGNOSIS — S76311D Strain of muscle, fascia and tendon of the posterior muscle group at thigh level, right thigh, subsequent encounter: Secondary | ICD-10-CM | POA: Diagnosis not present

## 2023-02-14 NOTE — Therapy (Signed)
OUTPATIENT PHYSICAL THERAPY LOWER EXTREMITY EVALUATION   Patient Name: Becky Bowers MRN: [id number] DOB:17-Feb-1956, 67 y.o., female Today's Date: 02/14/2023  END OF SESSION:  PT End of Session - 02/14/23 1104     Visit Number 2    Number of Visits 16    Date for PT Re-Evaluation 04/04/23    Authorization Type [employer]    PT Start Time 1104    PT Stop Time 1145    PT Time Calculation (min) 41 min    Activity Tolerance Patient tolerated treatment well              Past Medical History:  Diagnosis Date   Arthritis    Concussion 09/12/2019   Past Surgical History:  Procedure Laterality Date   APPENDECTOMY  1968   DILATION AND CURETTAGE OF UTERUS  1990   TONSILLECTOMY  1980   Patient Active Problem List   Diagnosis Date Noted   Tear of right hamstring 02/03/2023   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   Elevated LDL cholesterol level 04/14/2021   Polyarthralgia 03/17/2021   Primary osteoarthritis right first [facility] and right second MCP 04/24/2018   Numbness of right third and fourth toes 03/19/2018   Primary osteoarthritis of both hips 12/08/2015   Headache 08/12/2010   Lumbar degenerative disc disease 01/07/2008    PCP: Agapito Games, MD  REFERRING PROVIDER: Monica Becton, MD  REFERRING DIAG: [phone] (ICD-10-CM) - Tear of right hamstring  THERAPY DIAG:  Pain in right hip  Stiffness of right hip, not elsewhere classified  Muscle weakness (generalized)  Other abnormalities of gait and mobility  Rationale for Evaluation and Treatment: Rehabilitation  ONSET DATE: Around May 16  SUBJECTIVE:   SUBJECTIVE STATEMENT: Pt states pain has been inconsistent. Reports she feels she may have over done it doing house work. She woke up at 4:30 am due to pain but is feeling pretty good now. Did get a thigh compression.   From eval: Pt states she had an inner ear issue and lost her balance. Pt was standing putting together a  rocking chair and slipped/jerked her body and felt something pop in her R posterior hip. Inner ear stuff got cleared up but had continued R posterior hip pain. Pt was walking 3.5 miles/day (has not been walking). Does pilates normally and stopped for now. Pain in general has been decreasing.   PERTINENT HISTORY: No history of injuries  PAIN:  Are you having pain? Yes: NPRS scale: 1 currently, at worst 3/10 Pain location: R posterior hip, radiates to groin area Pain description: dull ache Aggravating factors: Prolonged sitting, mornings getting out of bed  Relieving factors: Ice  PRECAUTIONS:  From Dr. Melvia Heaps referral notes: Patient is a right biceps femoris tear, minimal retraction, ischiogluteal bursitis, needs to work on passive range of motion for a week, active range of motion for 1 to 2 weeks, and then ECCENTRIC strengthening. Please minimize stretching at least for the first couple of weeks.  WEIGHT BEARING RESTRICTIONS: No  FALLS:  Has patient fallen in last 6 months? No  LIVING ENVIRONMENT: Lives with: lives with their spouse Lives in: House/apartment Stairs: Yes: External: 2 steps; none Has following equipment at home: None  OCCUPATION: Retired  PLOF: Independent  PATIENT GOALS: Return to all normal functions  NEXT MD VISIT: August  OBJECTIVE:   DIAGNOSTIC FINDINGS: MRI 01/21/23 IMPRESSION: 1. Right hamstring tendinosis with a full-thickness minimally retracted tear of the biceps femoris tendon origin with peritendinous edema.  2. Moderate volume fluid within the right ischiogluteal bursa. 3. Mild tendinosis of the contralateral left hamstring origin. 4. Mild tendinosis of the bilateral gluteus medius and minimus tendons without tear. 5. Mild osteoarthritis of the right hip.  PATIENT SURVEYS:  LEFS 60/80 = 75%  EDEMA:  Mild in R buttock  MUSCLE LENGTH: Hamstrings: deferred per precautions Thomas test: WNL  POSTURE:  bilat knees slightly  bent  PALPATION: R glute, piriformis tenderness  LOWER EXTREMITY ROM:  Active ROM Right eval Left eval  Hip flexion    Hip extension    Hip abduction    Hip adduction    Hip internal rotation    Hip external rotation    Knee flexion 120 135  Knee extension 0 0  Ankle dorsiflexion    Ankle plantarflexion    Ankle inversion    Ankle eversion     (Blank rows = not tested)  LOWER EXTREMITY MMT:  MMT Right eval Left eval  Hip flexion 5 5  Hip extension 3- 4  Hip abduction 4- 4  Hip adduction    Hip internal rotation deferred 4  Hip external rotation deferred 3+  Knee flexion deferred 5  Knee extension 5 5  Ankle dorsiflexion    Ankle plantarflexion    Ankle inversion    Ankle eversion     (Blank rows = not tested)  LOWER EXTREMITY SPECIAL TESTS:  Hip special tests: Luisa Hart (FABER) test: positive , Thomas test: negative, and Ely's test: negative  FUNCTIONAL TESTS:  Deferred  GAIT: Mildly antalgic on R with knee remaining slightly flexed   TODAY'S TREATMENT:    [facility] Adult PT Treatment:                                                DATE: 02/14/23 Therapeutic Exercise: Recumbent bike L1 x 5 min Supine Knee flexion/extension feet on pball 3x10 Quad set 3x10 SLR 3x10 Bent knee fall outs 3x10 Hip abd 3x10 Prone Knee flexion AROM 2x10 Sidelying Clamshell attempted but could feel in hamstrings Therapeutic Activity: Reviewed golfer's lift, squats and lunges as lifting techniques to decrease hamstring/glute activity/strain on R                                                                                                                             DATE: 02/07/23 See HEP below    PATIENT EDUCATION:  Education details: Exam findings, POC, initial HEP Person educated: Patient Education method: Explanation, Demonstration, and Handouts Education comprehension: verbalized understanding, returned demonstration, and needs further education  HOME EXERCISE  PROGRAM: Access Code: [email or web address] URL: [email or web address] Date: 02/07/2023 Prepared by: Vernon Prey April Kirstie Peri  Exercises - Modified Prone Quadriceps Stretch with Strap  - 1 x daily - 7 x weekly - 2 sets - 10 reps - Supine Figure 4  -  1 x daily - 7 x weekly - 2 sets - 10 reps - Supine Heel Slide with Strap  - 1 x daily - 7 x weekly - 2 sets - 10 reps - Supine Lower Trunk Rotation  - 1 x daily - 7 x weekly - 2 sets - 10 reps  ASSESSMENT:  CLINICAL IMPRESSION: Treatment focused on progressing pt to AROM exercises this session. Pt tolerated well. Discussed safe lifting to decrease any tension/strain on her R hamstring/glutes.   From eval: Patient is a 67 y.o. F who was seen today for physical therapy evaluation and treatment for R hamstring tear and glute/hamstring tendinosis. Assessment significant for weak R hip and decreased stability. Per Dr. Melvia Heaps referral, PT to start with PROM x1 week and then AROM for the next 1-2 weeks before initiating eccentric strengthening. Pt will benefit from PT to recover from her injury and return to PLOF. Initiated gentle PROM/AAROM this session for hip mobility with good pt tolerance.   OBJECTIVE IMPAIRMENTS: decreased activity tolerance, decreased balance, decreased endurance, decreased mobility, difficulty walking, decreased ROM, decreased strength, increased edema, increased fascial restrictions, impaired flexibility, improper body mechanics, and pain.    GOALS: Goals reviewed with patient? Yes  SHORT TERM GOALS: Target date: 03/07/2023  Pt will be ind with initial HEP Baseline: Goal status: INITIAL  2.  Pt will demo L = R knee AROM to demo improving hamstring mobility Baseline:  Goal status: INITIAL   LONG TERM GOALS: Target date: 04/04/2023   Pt will be ind with management and progression of HEP Baseline:  Goal status: INITIAL  2.  Pt will be able to tolerate single leg stability on R LE x 30 sec Baseline:  Goal status:  INITIAL  3.  Pt will be able to walk at least 3 miles without exacerbation of pain to >/=1/10 Baseline:  Goal status: INITIAL  4.  Pt will be able to squat and lift at least 10# to demo increased bilat LE strength Baseline:  Goal status: INITIAL  5.  Pt will have increased LEFS to >/=70/80 to demo MCID Baseline:  Goal status: INITIAL    PLAN:  PT FREQUENCY: 1-2x/week  PT DURATION: 8 weeks  PLANNED INTERVENTIONS: Therapeutic exercises, Therapeutic activity, Neuromuscular re-education, Balance training, Gait training, Patient/Family education, Self Care, Joint mobilization, Aquatic Therapy, Dry Needling, Electrical stimulation, Cryotherapy, Moist heat, Taping, Vasopneumatic device, Ionotophoresis 4mg /ml Dexamethasone, Manual therapy, and Re-evaluation  PLAN FOR NEXT SESSION: Assess response to HEP. Follow precautions. Initiate AROM and gentle isometrics.    Burak Zerbe April Ma L Damyra Luscher, PT 02/14/2023, 11:04 AM

## 2023-02-16 ENCOUNTER — Ambulatory Visit: Payer: Medicare PPO | Admitting: Physical Therapy

## 2023-02-16 DIAGNOSIS — M25551 Pain in right hip: Secondary | ICD-10-CM | POA: Diagnosis not present

## 2023-02-16 DIAGNOSIS — S76311D Strain of muscle, fascia and tendon of the posterior muscle group at thigh level, right thigh, subsequent encounter: Secondary | ICD-10-CM | POA: Diagnosis not present

## 2023-02-16 DIAGNOSIS — R2689 Other abnormalities of gait and mobility: Secondary | ICD-10-CM

## 2023-02-16 DIAGNOSIS — M6281 Muscle weakness (generalized): Secondary | ICD-10-CM | POA: Diagnosis not present

## 2023-02-16 DIAGNOSIS — M25651 Stiffness of right hip, not elsewhere classified: Secondary | ICD-10-CM

## 2023-02-16 NOTE — Therapy (Signed)
OUTPATIENT PHYSICAL THERAPY LOWER EXTREMITY TREATMENT   Patient Name: Becky Bowers MRN: [id number] DOB:Aug 09, 1955, 67 y.o., female Today's Date: 02/16/2023  END OF SESSION:  PT End of Session - 02/16/23 1108     Visit Number 3    Number of Visits 16    Date for PT Re-Evaluation 04/04/23    Authorization Type [employer]    PT Start Time 1106    PT Stop Time 1145    PT Time Calculation (min) 39 min    Activity Tolerance Patient tolerated treatment well               Past Medical History:  Diagnosis Date   Arthritis    Concussion 09/12/2019   Past Surgical History:  Procedure Laterality Date   APPENDECTOMY  1968   DILATION AND CURETTAGE OF UTERUS  1990   TONSILLECTOMY  1980   Patient Active Problem List   Diagnosis Date Noted   Tear of right hamstring 02/03/2023   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   Elevated LDL cholesterol level 04/14/2021   Polyarthralgia 03/17/2021   Primary osteoarthritis right first [facility] and right second MCP 04/24/2018   Numbness of right third and fourth toes 03/19/2018   Primary osteoarthritis of both hips 12/08/2015   Headache 08/12/2010   Lumbar degenerative disc disease 01/07/2008    PCP: Agapito Games, MD  REFERRING PROVIDER: Monica Becton, MD  REFERRING DIAG: S76.311A (ICD-10-CM) - Tear of right hamstring  THERAPY DIAG:  No diagnosis found.  Rationale for Evaluation and Treatment: Rehabilitation  ONSET DATE: Around May 16  SUBJECTIVE:   SUBJECTIVE STATEMENT: Pt reports nothing new or different.   From eval: Pt states she had an inner ear issue and lost her balance. Pt was standing putting together a rocking chair and slipped/jerked her body and felt something pop in her R posterior hip. Inner ear stuff got cleared up but had continued R posterior hip pain. Pt was walking 3.5 miles/day (has not been walking). Does pilates normally and stopped for now. Pain in general has been  decreasing.   PERTINENT HISTORY: No history of injuries  PAIN:  Are you having pain? Yes: NPRS scale: 1 currently, at worst 3/10 Pain location: R posterior hip, radiates to groin area Pain description: dull ache Aggravating factors: Prolonged sitting, mornings getting out of bed  Relieving factors: Ice  PRECAUTIONS:  From Dr. Melvia Heaps referral notes: Patient is a right biceps femoris tear, minimal retraction, ischiogluteal bursitis, needs to work on passive range of motion for a week, active range of motion for 1 to 2 weeks, and then ECCENTRIC strengthening. Please minimize stretching at least for the first couple of weeks.  WEIGHT BEARING RESTRICTIONS: No  FALLS:  Has patient fallen in last 6 months? No  LIVING ENVIRONMENT: Lives with: lives with their spouse Lives in: House/apartment Stairs: Yes: External: 2 steps; none Has following equipment at home: None  OCCUPATION: Retired  PLOF: Independent  PATIENT GOALS: Return to all normal functions  NEXT MD VISIT: August  OBJECTIVE:   DIAGNOSTIC FINDINGS: MRI 01/21/23 IMPRESSION: 1. Right hamstring tendinosis with a full-thickness minimally retracted tear of the biceps femoris tendon origin with peritendinous edema. 2. Moderate volume fluid within the right ischiogluteal bursa. 3. Mild tendinosis of the contralateral left hamstring origin. 4. Mild tendinosis of the bilateral gluteus medius and minimus tendons without tear. 5. Mild osteoarthritis of the right hip.  PATIENT SURVEYS:  LEFS 60/80 = 75%  EDEMA:  Mild in  R buttock  MUSCLE LENGTH: Hamstrings: deferred per precautions Thomas test: WNL  POSTURE:  bilat knees slightly bent  PALPATION: R glute, piriformis tenderness  LOWER EXTREMITY ROM:  Active ROM Right eval Left eval  Hip flexion    Hip extension    Hip abduction    Hip adduction    Hip internal rotation    Hip external rotation    Knee flexion 120 135  Knee extension 0 0  Ankle dorsiflexion     Ankle plantarflexion    Ankle inversion    Ankle eversion     (Blank rows = not tested)  LOWER EXTREMITY MMT:  MMT Right eval Left eval  Hip flexion 5 5  Hip extension 3- 4  Hip abduction 4- 4  Hip adduction    Hip internal rotation deferred 4  Hip external rotation deferred 3+  Knee flexion deferred 5  Knee extension 5 5  Ankle dorsiflexion    Ankle plantarflexion    Ankle inversion    Ankle eversion     (Blank rows = not tested)  LOWER EXTREMITY SPECIAL TESTS:  Hip special tests: Luisa Hart (FABER) test: positive , Thomas test: negative, and Ely's test: negative  FUNCTIONAL TESTS:  Deferred  GAIT: Mildly antalgic on R with knee remaining slightly flexed   TODAY'S TREATMENT:    [facility] Adult PT Treatment:                                                DATE: 02/16/23 Therapeutic Exercise: Recumbent bike L1 x 6 min Supine Knee flexion/extension feet on pball 3x10 SAQ on pball 3x10 PPT + marching 3x10 Quad set 3x10 SLR 3x10 Bent knee fall outs 3x10 Hip abd 3x10 Prone Knee flexion 3x10 Hip IR/ER 3x10   [facility] Adult PT Treatment:                                                DATE: 02/14/23 Therapeutic Exercise: Recumbent bike L1 x 5 min Supine Knee flexion/extension feet on pball 3x10 Quad set 3x10 SLR 3x10 Bent knee fall outs 3x10 Hip abd 3x10 Prone Knee flexion AROM 2x10 Sidelying Clamshell attempted but could feel in hamstrings Therapeutic Activity: Reviewed golfer's lift, squats and lunges as lifting techniques to decrease hamstring/glute activity/strain on R                                                                                                                             DATE: 02/07/23 See HEP below    PATIENT EDUCATION:  Education details: Exam findings, POC, initial HEP Person educated: Patient Education method: Explanation, Demonstration, and Handouts Education comprehension: verbalized understanding, returned demonstration, and needs  further education  HOME EXERCISE PROGRAM: Access Code: [email or web address] URL: [email or web address] Date: 02/07/2023 Prepared by: Vernon Prey April Kirstie Peri  Exercises - Modified Prone Quadriceps Stretch with Strap  - 1 x daily - 7 x weekly - 2 sets - 10 reps - Supine Figure 4  - 1 x daily - 7 x weekly - 2 sets - 10 reps - Supine Heel Slide with Strap  - 1 x daily - 7 x weekly - 2 sets - 10 reps - Supine Lower Trunk Rotation  - 1 x daily - 7 x weekly - 2 sets - 10 reps  ASSESSMENT:  CLINICAL IMPRESSION: Continued AROM exercises. Good tolerance to activities with no increase in pain. Will be able to start gentle resistance next week.   From eval: Patient is a 67 y.o. F who was seen today for physical therapy evaluation and treatment for R hamstring tear and glute/hamstring tendinosis. Assessment significant for weak R hip and decreased stability. Per Dr. Melvia Heaps referral, PT to start with PROM x1 week and then AROM for the next 1-2 weeks before initiating eccentric strengthening. Pt will benefit from PT to recover from her injury and return to PLOF. Initiated gentle PROM/AAROM this session for hip mobility with good pt tolerance.   OBJECTIVE IMPAIRMENTS: decreased activity tolerance, decreased balance, decreased endurance, decreased mobility, difficulty walking, decreased ROM, decreased strength, increased edema, increased fascial restrictions, impaired flexibility, improper body mechanics, and pain.    GOALS: Goals reviewed with patient? Yes  SHORT TERM GOALS: Target date: 03/07/2023  Pt will be ind with initial HEP Baseline: Goal status: INITIAL  2.  Pt will demo L = R knee AROM to demo improving hamstring mobility Baseline:  Goal status: INITIAL   LONG TERM GOALS: Target date: 04/04/2023   Pt will be ind with management and progression of HEP Baseline:  Goal status: INITIAL  2.  Pt will be able to tolerate single leg stability on R LE x 30 sec Baseline:  Goal status:  INITIAL  3.  Pt will be able to walk at least 3 miles without exacerbation of pain to >/=1/10 Baseline:  Goal status: INITIAL  4.  Pt will be able to squat and lift at least 10# to demo increased bilat LE strength Baseline:  Goal status: INITIAL  5.  Pt will have increased LEFS to >/=70/80 to demo MCID Baseline:  Goal status: INITIAL    PLAN:  PT FREQUENCY: 1-2x/week  PT DURATION: 8 weeks  PLANNED INTERVENTIONS: Therapeutic exercises, Therapeutic activity, Neuromuscular re-education, Balance training, Gait training, Patient/Family education, Self Care, Joint mobilization, Aquatic Therapy, Dry Needling, Electrical stimulation, Cryotherapy, Moist heat, Taping, Vasopneumatic device, Ionotophoresis 4mg /ml Dexamethasone, Manual therapy, and Re-evaluation  PLAN FOR NEXT SESSION: Assess response to HEP. Follow precautions. Initiate AROM and gentle isometrics.    Gloris Shiroma April Ma L Jorel Gravlin, PT 02/16/2023, 11:08 AM

## 2023-02-21 ENCOUNTER — Ambulatory Visit: Payer: Medicare PPO | Admitting: Physical Therapy

## 2023-02-21 ENCOUNTER — Encounter: Payer: Self-pay | Admitting: Physical Therapy

## 2023-02-21 DIAGNOSIS — M25651 Stiffness of right hip, not elsewhere classified: Secondary | ICD-10-CM | POA: Diagnosis not present

## 2023-02-21 DIAGNOSIS — M25551 Pain in right hip: Secondary | ICD-10-CM | POA: Diagnosis not present

## 2023-02-21 DIAGNOSIS — S76311D Strain of muscle, fascia and tendon of the posterior muscle group at thigh level, right thigh, subsequent encounter: Secondary | ICD-10-CM | POA: Diagnosis not present

## 2023-02-21 DIAGNOSIS — M6281 Muscle weakness (generalized): Secondary | ICD-10-CM | POA: Diagnosis not present

## 2023-02-21 DIAGNOSIS — R2689 Other abnormalities of gait and mobility: Secondary | ICD-10-CM

## 2023-02-21 NOTE — Therapy (Signed)
OUTPATIENT PHYSICAL THERAPY LOWER EXTREMITY TREATMENT   Patient Name: Becky Bowers MRN: [id number] DOB:01/20/1956, 67 y.o., female Today's Date: 02/21/2023  END OF SESSION:  PT End of Session - 02/21/23 1103     Visit Number 4    Number of Visits 16    Date for PT Re-Evaluation 04/04/23    Authorization Type [employer]    PT Start Time 1101    PT Stop Time 1140    PT Time Calculation (min) 39 min    Activity Tolerance Patient tolerated treatment well               Past Medical History:  Diagnosis Date   Arthritis    Concussion 09/12/2019   Past Surgical History:  Procedure Laterality Date   APPENDECTOMY  1968   DILATION AND CURETTAGE OF UTERUS  1990   TONSILLECTOMY  1980   Patient Active Problem List   Diagnosis Date Noted   Tear of right hamstring 02/03/2023   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   Elevated LDL cholesterol level 04/14/2021   Polyarthralgia 03/17/2021   Primary osteoarthritis right first [facility] and right second MCP 04/24/2018   Numbness of right third and fourth toes 03/19/2018   Primary osteoarthritis of both hips 12/08/2015   Headache 08/12/2010   Lumbar degenerative disc disease 01/07/2008    PCP: Agapito Games, MD  REFERRING PROVIDER: Monica Becton, MD  REFERRING DIAG: [phone] (ICD-10-CM) - Tear of right hamstring  THERAPY DIAG:  Pain in right hip  Stiffness of right hip, not elsewhere classified  Muscle weakness (generalized)  Other abnormalities of gait and mobility  Rationale for Evaluation and Treatment: Rehabilitation  ONSET DATE: Around May 16  SUBJECTIVE:   SUBJECTIVE STATEMENT: Pt states today is her anniversary. Continues to have no pain.   From eval: Pt states she had an inner ear issue and lost her balance. Pt was standing putting together a rocking chair and slipped/jerked her body and felt something pop in her R posterior hip. Inner ear stuff got cleared up but had  continued R posterior hip pain. Pt was walking 3.5 miles/day (has not been walking). Does pilates normally and stopped for now. Pain in general has been decreasing.   PERTINENT HISTORY: No history of injuries  PAIN:  Are you having pain? Yes: NPRS scale: 1 currently, at worst 3/10 Pain location: R posterior hip, radiates to groin area Pain description: dull ache Aggravating factors: Prolonged sitting, mornings getting out of bed  Relieving factors: Ice  PRECAUTIONS:  From Dr. Melvia Heaps referral notes: Patient is a right biceps femoris tear, minimal retraction, ischiogluteal bursitis, needs to work on passive range of motion for a week, active range of motion for 1 to 2 weeks, and then ECCENTRIC strengthening. Please minimize stretching at least for the first couple of weeks.  WEIGHT BEARING RESTRICTIONS: No  FALLS:  Has patient fallen in last 6 months? No  LIVING ENVIRONMENT: Lives with: lives with their spouse Lives in: House/apartment Stairs: Yes: External: 2 steps; none Has following equipment at home: None  OCCUPATION: Retired  PLOF: Independent  PATIENT GOALS: Return to all normal functions  NEXT MD VISIT: August  OBJECTIVE:   DIAGNOSTIC FINDINGS: MRI 01/21/23 IMPRESSION: 1. Right hamstring tendinosis with a full-thickness minimally retracted tear of the biceps femoris tendon origin with peritendinous edema. 2. Moderate volume fluid within the right ischiogluteal bursa. 3. Mild tendinosis of the contralateral left hamstring origin. 4. Mild tendinosis of the bilateral gluteus medius  and minimus tendons without tear. 5. Mild osteoarthritis of the right hip.  PATIENT SURVEYS:  LEFS 60/80 = 75%  EDEMA:  Mild in R buttock  MUSCLE LENGTH: Hamstrings: deferred per precautions Thomas test: WNL  POSTURE:  bilat knees slightly bent  PALPATION: R glute, piriformis tenderness  LOWER EXTREMITY ROM:  Active ROM Right eval Left eval  Hip flexion    Hip extension     Hip abduction    Hip adduction    Hip internal rotation    Hip external rotation    Knee flexion 120 135  Knee extension 0 0  Ankle dorsiflexion    Ankle plantarflexion    Ankle inversion    Ankle eversion     (Blank rows = not tested)  LOWER EXTREMITY MMT:  MMT Right eval Left eval  Hip flexion 5 5  Hip extension 3- 4  Hip abduction 4- 4  Hip adduction    Hip internal rotation deferred 4  Hip external rotation deferred 3+  Knee flexion deferred 5  Knee extension 5 5  Ankle dorsiflexion    Ankle plantarflexion    Ankle inversion    Ankle eversion     (Blank rows = not tested)  LOWER EXTREMITY SPECIAL TESTS:  Hip special tests: Luisa Hart (FABER) test: positive , Thomas test: negative, and Ely's test: negative  FUNCTIONAL TESTS:  Deferred  GAIT: Mildly antalgic on R with knee remaining slightly flexed   TODAY'S TREATMENT:    [facility] Adult PT Treatment:                                                DATE: 02/21/23 Therapeutic Exercise: Recumbent bike L2 x 5 min Supine Pball knees flexed press down iso 2x10 Knees extended glute set 2x10 Clamshell isometrics against belt 2x10 Prone Eccentric hamstring curl red TB 2x10 Eccentric hip IR red TB 2x10 Standing Captain morgans 10x5 sec Heel raise 2x10   [facility] Adult PT Treatment:                                                DATE: 02/16/23 Therapeutic Exercise: Recumbent bike L1 x 6 min Supine Knee flexion/extension feet on pball 3x10 SAQ on pball 3x10 PPT + marching 3x10 Quad set 3x10 SLR 3x10 Bent knee fall outs 3x10 Hip abd 3x10 Prone Knee flexion 3x10 Hip IR/ER 3x10   [facility] Adult PT Treatment:                                                DATE: 02/14/23 Therapeutic Exercise: Recumbent bike L1 x 5 min Supine Knee flexion/extension feet on pball 3x10 Quad set 3x10 SLR 3x10 Bent knee fall outs 3x10 Hip abd 3x10 Prone Knee flexion AROM 2x10 Sidelying Clamshell attempted but could feel in  hamstrings Therapeutic Activity: Reviewed golfer's lift, squats and lunges as lifting techniques to decrease hamstring/glute activity/strain on R  DATE: 02/07/23 See HEP below    PATIENT EDUCATION:  Education details: Exam findings, POC, initial HEP Person educated: Patient Education method: Explanation, Demonstration, and Handouts Education comprehension: verbalized understanding, returned demonstration, and needs further education  HOME EXERCISE PROGRAM: Access Code: [email or web address] URL: [email or web address] Date: 02/07/2023 Prepared by: Vernon Prey April Kirstie Peri  Exercises - Modified Prone Quadriceps Stretch with Strap  - 1 x daily - 7 x weekly - 2 sets - 10 reps - Supine Figure 4  - 1 x daily - 7 x weekly - 2 sets - 10 reps - Supine Heel Slide with Strap  - 1 x daily - 7 x weekly - 2 sets - 10 reps - Supine Lower Trunk Rotation  - 1 x daily - 7 x weekly - 2 sets - 10 reps  ASSESSMENT:  CLINICAL IMPRESSION: Initiated gentle isometrics and eccentrics today. Pt with good pt tolerance.   From eval: Patient is a 67 y.o. F who was seen today for physical therapy evaluation and treatment for R hamstring tear and glute/hamstring tendinosis. Assessment significant for weak R hip and decreased stability. Per Dr. Melvia Heaps referral, PT to start with PROM x1 week and then AROM for the next 1-2 weeks before initiating eccentric strengthening. Pt will benefit from PT to recover from her injury and return to PLOF. Initiated gentle PROM/AAROM this session for hip mobility with good pt tolerance.   OBJECTIVE IMPAIRMENTS: decreased activity tolerance, decreased balance, decreased endurance, decreased mobility, difficulty walking, decreased ROM, decreased strength, increased edema, increased fascial restrictions, impaired flexibility, improper body mechanics, and pain.     GOALS: Goals reviewed with patient? Yes  SHORT TERM GOALS: Target date: 03/07/2023  Pt will be ind with initial HEP Baseline: Goal status: INITIAL  2.  Pt will demo L = R knee AROM to demo improving hamstring mobility Baseline:  Goal status: INITIAL   LONG TERM GOALS: Target date: 04/04/2023   Pt will be ind with management and progression of HEP Baseline:  Goal status: INITIAL  2.  Pt will be able to tolerate single leg stability on R LE x 30 sec Baseline:  Goal status: INITIAL  3.  Pt will be able to walk at least 3 miles without exacerbation of pain to >/=1/10 Baseline:  Goal status: INITIAL  4.  Pt will be able to squat and lift at least 10# to demo increased bilat LE strength Baseline:  Goal status: INITIAL  5.  Pt will have increased LEFS to >/=70/80 to demo MCID Baseline:  Goal status: INITIAL    PLAN:  PT FREQUENCY: 1-2x/week  PT DURATION: 8 weeks  PLANNED INTERVENTIONS: Therapeutic exercises, Therapeutic activity, Neuromuscular re-education, Balance training, Gait training, Patient/Family education, Self Care, Joint mobilization, Aquatic Therapy, Dry Needling, Electrical stimulation, Cryotherapy, Moist heat, Taping, Vasopneumatic device, Ionotophoresis 4mg /ml Dexamethasone, Manual therapy, and Re-evaluation  PLAN FOR NEXT SESSION: Assess response to HEP. Follow precautions. Continue to work on Therapist, nutritional.    Jasmeet Gehl April Ma L Elbert Polyakov, PT 02/21/2023, 11:37 AM

## 2023-02-23 ENCOUNTER — Encounter: Payer: Self-pay | Admitting: Physical Therapy

## 2023-02-23 ENCOUNTER — Ambulatory Visit: Payer: Medicare PPO | Admitting: Physical Therapy

## 2023-02-23 DIAGNOSIS — R2689 Other abnormalities of gait and mobility: Secondary | ICD-10-CM | POA: Diagnosis not present

## 2023-02-23 DIAGNOSIS — M25651 Stiffness of right hip, not elsewhere classified: Secondary | ICD-10-CM

## 2023-02-23 DIAGNOSIS — M6281 Muscle weakness (generalized): Secondary | ICD-10-CM | POA: Diagnosis not present

## 2023-02-23 DIAGNOSIS — M25551 Pain in right hip: Secondary | ICD-10-CM

## 2023-02-23 DIAGNOSIS — S76311D Strain of muscle, fascia and tendon of the posterior muscle group at thigh level, right thigh, subsequent encounter: Secondary | ICD-10-CM | POA: Diagnosis not present

## 2023-02-23 NOTE — Therapy (Signed)
OUTPATIENT PHYSICAL THERAPY LOWER EXTREMITY TREATMENT   Patient Name: Becky Bowers MRN: [id number] DOB:October 31, 1955, 67 y.o., female Today's Date: 02/23/2023  END OF SESSION:  PT End of Session - 02/23/23 1102     Visit Number 5    Number of Visits 16    Date for PT Re-Evaluation 04/04/23    Authorization Type [employer]    PT Start Time 1103    PT Stop Time 1145    PT Time Calculation (min) 42 min    Activity Tolerance Patient tolerated treatment well               Past Medical History:  Diagnosis Date   Arthritis    Concussion 09/12/2019   Past Surgical History:  Procedure Laterality Date   APPENDECTOMY  1968   DILATION AND CURETTAGE OF UTERUS  1990   TONSILLECTOMY  1980   Patient Active Problem List   Diagnosis Date Noted   Tear of right hamstring 02/03/2023   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   Elevated LDL cholesterol level 04/14/2021   Polyarthralgia 03/17/2021   Primary osteoarthritis right first [facility] and right second MCP 04/24/2018   Numbness of right third and fourth toes 03/19/2018   Primary osteoarthritis of both hips 12/08/2015   Headache 08/12/2010   Lumbar degenerative disc disease 01/07/2008    PCP: Agapito Games, MD  REFERRING PROVIDER: Monica Becton, MD  REFERRING DIAG: [phone] (ICD-10-CM) - Tear of right hamstring  THERAPY DIAG:  Pain in right hip  Stiffness of right hip, not elsewhere classified  Muscle weakness (generalized)  Other abnormalities of gait and mobility  Rationale for Evaluation and Treatment: Rehabilitation  ONSET DATE: Around May 16  SUBJECTIVE:   SUBJECTIVE STATEMENT: Pt states no increase in pain, soreness or swelling since initiation of resistance exercises.   From eval: Pt states she had an inner ear issue and lost her balance. Pt was standing putting together a rocking chair and slipped/jerked her body and felt something pop in her R posterior hip. Inner ear  stuff got cleared up but had continued R posterior hip pain. Pt was walking 3.5 miles/day (has not been walking). Does pilates normally and stopped for now. Pain in general has been decreasing.   PERTINENT HISTORY: No history of injuries  PAIN:  Are you having pain? Yes: NPRS scale: 1 currently, at worst 3/10 Pain location: R posterior hip, radiates to groin area Pain description: dull ache Aggravating factors: Prolonged sitting, mornings getting out of bed  Relieving factors: Ice  PRECAUTIONS:  From Dr. Melvia Heaps referral notes: Patient is a right biceps femoris tear, minimal retraction, ischiogluteal bursitis, needs to work on passive range of motion for a week, active range of motion for 1 to 2 weeks, and then ECCENTRIC strengthening. Please minimize stretching at least for the first couple of weeks.  WEIGHT BEARING RESTRICTIONS: No  FALLS:  Has patient fallen in last 6 months? No  LIVING ENVIRONMENT: Lives with: lives with their spouse Lives in: House/apartment Stairs: Yes: External: 2 steps; none Has following equipment at home: None  OCCUPATION: Retired  PLOF: Independent  PATIENT GOALS: Return to all normal functions  NEXT MD VISIT: August  OBJECTIVE:   DIAGNOSTIC FINDINGS: MRI 01/21/23 IMPRESSION: 1. Right hamstring tendinosis with a full-thickness minimally retracted tear of the biceps femoris tendon origin with peritendinous edema. 2. Moderate volume fluid within the right ischiogluteal bursa. 3. Mild tendinosis of the contralateral left hamstring origin. 4. Mild tendinosis of the  bilateral gluteus medius and minimus tendons without tear. 5. Mild osteoarthritis of the right hip.  PATIENT SURVEYS:  LEFS 60/80 = 75%  EDEMA:  Mild in R buttock  MUSCLE LENGTH: Hamstrings: deferred per precautions Thomas test: WNL  POSTURE:  bilat knees slightly bent  PALPATION: R glute, piriformis tenderness  LOWER EXTREMITY ROM:  Active ROM Right eval Left eval  Hip  flexion    Hip extension    Hip abduction    Hip adduction    Hip internal rotation    Hip external rotation    Knee flexion 120 135  Knee extension 0 0  Ankle dorsiflexion    Ankle plantarflexion    Ankle inversion    Ankle eversion     (Blank rows = not tested)  LOWER EXTREMITY MMT:  MMT Right eval Left eval  Hip flexion 5 5  Hip extension 3- 4  Hip abduction 4- 4  Hip adduction    Hip internal rotation deferred 4  Hip external rotation deferred 3+  Knee flexion deferred 5  Knee extension 5 5  Ankle dorsiflexion    Ankle plantarflexion    Ankle inversion    Ankle eversion     (Blank rows = not tested)  LOWER EXTREMITY SPECIAL TESTS:  Hip special tests: Luisa Hart (FABER) test: positive , Thomas test: negative, and Ely's test: negative  FUNCTIONAL TESTS:  Deferred  GAIT: Mildly antalgic on R with knee remaining slightly flexed   TODAY'S TREATMENT:    [facility] Adult PT Treatment:                                                DATE: 02/23/23 Therapeutic Exercise: Recumbent bike L2 x 5 min Supine Pball knee flexion with heel press down 3x10 Pball knee extended bridge 2x10 Clamshell iso with bridging 2x10 Hip abd red TB 2x10 SLR red TB 2x10 Prone Eccentric hamstring curl red TB 2x10 Eccentric hip IR red TB 2x10 Prone ball squeeze between heels 2x10 Standing Glute iso press down on 4" step 2x10x3 sec   [facility] Adult PT Treatment:                                                DATE: 02/21/23 Therapeutic Exercise: Recumbent bike L2 x 5 min Supine Pball knees flexed press down iso 2x10 Knees extended glute set 2x10 Clamshell isometrics against belt 2x10 Prone Eccentric hamstring curl red TB 2x10 Eccentric hip IR red TB 2x10 Standing Captain morgans 10x5 sec Heel raise 2x10   [facility] Adult PT Treatment:                                                DATE: 02/16/23 Therapeutic Exercise: Recumbent bike L1 x 6 min Supine Knee flexion/extension feet on pball  3x10 SAQ on pball 3x10 PPT + marching 3x10 Quad set 3x10 SLR 3x10 Bent knee fall outs 3x10 Hip abd 3x10 Prone Knee flexion 3x10 Hip IR/ER 3x10    PATIENT EDUCATION:  Education details: Exam findings, POC, initial HEP Person educated: Patient Education method: Explanation, Demonstration, and Handouts Education comprehension:  verbalized understanding, returned demonstration, and needs further education  HOME EXERCISE PROGRAM: Access Code: [email or web address] URL: [email or web address] Date: 02/07/2023 Prepared by: Vernon Prey April Kirstie Peri  Exercises - Modified Prone Quadriceps Stretch with Strap  - 1 x daily - 7 x weekly - 2 sets - 10 reps - Supine Figure 4  - 1 x daily - 7 x weekly - 2 sets - 10 reps - Supine Heel Slide with Strap  - 1 x daily - 7 x weekly - 2 sets - 10 reps - Supine Lower Trunk Rotation  - 1 x daily - 7 x weekly - 2 sets - 10 reps  ASSESSMENT:  CLINICAL IMPRESSION: No adverse effects of increasing pt's resistance. Continued to progress resistance and eccentrics.   From eval: Patient is a 67 y.o. F who was seen today for physical therapy evaluation and treatment for R hamstring tear and glute/hamstring tendinosis. Assessment significant for weak R hip and decreased stability. Per Dr. Melvia Heaps referral, PT to start with PROM x1 week and then AROM for the next 1-2 weeks before initiating eccentric strengthening. Pt will benefit from PT to recover from her injury and return to PLOF. Initiated gentle PROM/AAROM this session for hip mobility with good pt tolerance.   OBJECTIVE IMPAIRMENTS: decreased activity tolerance, decreased balance, decreased endurance, decreased mobility, difficulty walking, decreased ROM, decreased strength, increased edema, increased fascial restrictions, impaired flexibility, improper body mechanics, and pain.    GOALS: Goals reviewed with patient? Yes  SHORT TERM GOALS: Target date: 03/07/2023  Pt will be ind with initial  HEP Baseline: Goal status: INITIAL  2.  Pt will demo L = R knee AROM to demo improving hamstring mobility Baseline:  Goal status: INITIAL   LONG TERM GOALS: Target date: 04/04/2023   Pt will be ind with management and progression of HEP Baseline:  Goal status: INITIAL  2.  Pt will be able to tolerate single leg stability on R LE x 30 sec Baseline:  Goal status: INITIAL  3.  Pt will be able to walk at least 3 miles without exacerbation of pain to >/=1/10 Baseline:  Goal status: INITIAL  4.  Pt will be able to squat and lift at least 10# to demo increased bilat LE strength Baseline:  Goal status: INITIAL  5.  Pt will have increased LEFS to >/=70/80 to demo MCID Baseline:  Goal status: INITIAL    PLAN:  PT FREQUENCY: 1-2x/week  PT DURATION: 8 weeks  PLANNED INTERVENTIONS: Therapeutic exercises, Therapeutic activity, Neuromuscular re-education, Balance training, Gait training, Patient/Family education, Self Care, Joint mobilization, Aquatic Therapy, Dry Needling, Electrical stimulation, Cryotherapy, Moist heat, Taping, Vasopneumatic device, Ionotophoresis 4mg /ml Dexamethasone, Manual therapy, and Re-evaluation  PLAN FOR NEXT SESSION: Assess response to HEP. Follow precautions. Continue to work on Therapist, nutritional.    Kathyjo Briere April Ma L Javarion Douty, PT 02/23/2023, 11:03 AM

## 2023-02-28 ENCOUNTER — Ambulatory Visit: Payer: Medicare PPO | Admitting: Physical Therapy

## 2023-02-28 ENCOUNTER — Encounter: Payer: Self-pay | Admitting: Physical Therapy

## 2023-02-28 DIAGNOSIS — M6281 Muscle weakness (generalized): Secondary | ICD-10-CM | POA: Diagnosis not present

## 2023-02-28 DIAGNOSIS — M25551 Pain in right hip: Secondary | ICD-10-CM

## 2023-02-28 DIAGNOSIS — R2689 Other abnormalities of gait and mobility: Secondary | ICD-10-CM | POA: Diagnosis not present

## 2023-02-28 DIAGNOSIS — M25651 Stiffness of right hip, not elsewhere classified: Secondary | ICD-10-CM | POA: Diagnosis not present

## 2023-02-28 DIAGNOSIS — S76311D Strain of muscle, fascia and tendon of the posterior muscle group at thigh level, right thigh, subsequent encounter: Secondary | ICD-10-CM | POA: Diagnosis not present

## 2023-02-28 NOTE — Therapy (Signed)
OUTPATIENT PHYSICAL THERAPY LOWER EXTREMITY TREATMENT   Patient Name: Becky Bowers MRN: [id number] DOB:[birth year], 67 y.o., female Today's Date: 02/28/2023  END OF SESSION:  PT End of Session - 02/28/23 1104     Visit Number 6    Number of Visits 16    Date for PT Re-Evaluation 04/04/23    Authorization Type [employer]    PT Start Time 1104    PT Stop Time 1145    PT Time Calculation (min) 41 min    Activity Tolerance Patient tolerated treatment well               Past Medical History:  Diagnosis Date   Arthritis    Concussion 09/12/2019   Past Surgical History:  Procedure Laterality Date   APPENDECTOMY  1968   DILATION AND CURETTAGE OF UTERUS  1990   TONSILLECTOMY  1980   Patient Active Problem List   Diagnosis Date Noted   Tear of right hamstring 02/03/2023   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   Elevated LDL cholesterol level 04/14/2021   Polyarthralgia 03/17/2021   Primary osteoarthritis right first [facility] and right second MCP 04/24/2018   Numbness of right third and fourth toes 03/19/2018   Primary osteoarthritis of both hips 12/08/2015   Headache 08/12/2010   Lumbar degenerative disc disease 01/07/2008    PCP: Agapito Games, MD  REFERRING PROVIDER: Monica Becton, MD  REFERRING DIAG: [phone] (ICD-10-CM) - Tear of right hamstring  THERAPY DIAG:  Pain in right hip  Stiffness of right hip, not elsewhere classified  Muscle weakness (generalized)  Other abnormalities of gait and mobility  Rationale for Evaluation and Treatment: Rehabilitation  ONSET DATE: Around May 16  SUBJECTIVE:   SUBJECTIVE STATEMENT: Pt states she thinks there might be more swelling difference than pain. Pt is noting some decreased R knee feeling. Pt is still getting R hip flexor twinge.   From eval: Pt states she had an inner ear issue and lost her balance. Pt was standing putting together a rocking chair and slipped/jerked her  body and felt something pop in her R posterior hip. Inner ear stuff got cleared up but had continued R posterior hip pain. Pt was walking 3.5 miles/day (has not been walking). Does pilates normally and stopped for now. Pain in general has been decreasing.   PERTINENT HISTORY: No history of injuries  PAIN:  Are you having pain? Yes: NPRS scale: 1 currently, at worst 3/10 Pain location: R posterior hip, radiates to groin area Pain description: dull ache Aggravating factors: Prolonged sitting, mornings getting out of bed  Relieving factors: Ice  PRECAUTIONS:  From Dr. Melvia Heaps referral notes: Patient is a right biceps femoris tear, minimal retraction, ischiogluteal bursitis, needs to work on passive range of motion for a week, active range of motion for 1 to 2 weeks, and then ECCENTRIC strengthening. Please minimize stretching at least for the first couple of weeks.  WEIGHT BEARING RESTRICTIONS: No  FALLS:  Has patient fallen in last 6 months? No  LIVING ENVIRONMENT: Lives with: lives with their spouse Lives in: House/apartment Stairs: Yes: External: 2 steps; none Has following equipment at home: None  OCCUPATION: Retired  PLOF: Independent  PATIENT GOALS: Return to all normal functions  NEXT MD VISIT: August  OBJECTIVE:   DIAGNOSTIC FINDINGS: MRI 01/21/23 IMPRESSION: 1. Right hamstring tendinosis with a full-thickness minimally retracted tear of the biceps femoris tendon origin with peritendinous edema. 2. Moderate volume fluid within the right ischiogluteal bursa.  3. Mild tendinosis of the contralateral left hamstring origin. 4. Mild tendinosis of the bilateral gluteus medius and minimus tendons without tear. 5. Mild osteoarthritis of the right hip.  PATIENT SURVEYS:  LEFS 60/80 = 75%  EDEMA:  Mild in R buttock  MUSCLE LENGTH: Hamstrings: deferred per precautions Thomas test: WNL  POSTURE:  bilat knees slightly bent  PALPATION: R glute, piriformis  tenderness  LOWER EXTREMITY ROM:  Active ROM Right eval Left eval  Hip flexion    Hip extension    Hip abduction    Hip adduction    Hip internal rotation    Hip external rotation    Knee flexion 120 135  Knee extension 0 0  Ankle dorsiflexion    Ankle plantarflexion    Ankle inversion    Ankle eversion     (Blank rows = not tested)  LOWER EXTREMITY MMT:  MMT Right eval Left eval  Hip flexion 5 5  Hip extension 3- 4  Hip abduction 4- 4  Hip adduction    Hip internal rotation deferred 4  Hip external rotation deferred 3+  Knee flexion deferred 5  Knee extension 5 5  Ankle dorsiflexion    Ankle plantarflexion    Ankle inversion    Ankle eversion     (Blank rows = not tested)  LOWER EXTREMITY SPECIAL TESTS:  Hip special tests: Luisa Hart (FABER) test: positive , Thomas test: negative, and Ely's test: negative  FUNCTIONAL TESTS:  Deferred  GAIT: Mildly antalgic on R with knee remaining slightly flexed   TODAY'S TREATMENT:    [facility] Adult PT Treatment:                                                DATE: 02/28/23 Therapeutic Exercise: Recumbent bike L3 x 5 min Supine Pball knee flexion with heel press down 2x10 Pball knee flexion with bridge eccentrics 2x10 Pball knee extended bridge 2x10 SLR red TB 2x10 Prone Ball mobilization on psoas Hip ext with knee flexed 2x10 Eccentric hamstring curl red TB 2x10 Eccentric leg press 40# 2x10 Standing X band walk red WOD band 2x10 Wall squat 3x10 sec   [facility] Adult PT Treatment:                                                DATE: 02/23/23 Therapeutic Exercise: Recumbent bike L2 x 5 min Supine Pball knee flexion with heel press down 3x10 Pball knee extended bridge 2x10 Clamshell iso with bridging 2x10 Hip abd red TB 2x10 SLR red TB 2x10 Prone Eccentric hamstring curl red TB 2x10 Eccentric hip IR red TB 2x10 Prone ball squeeze between heels 2x10 Standing Glute iso press down on 4" step 2x10x3 sec   [facility] Adult  PT Treatment:                                                DATE: 02/21/23 Therapeutic Exercise: Recumbent bike L2 x 5 min Supine Pball knees flexed press down iso 2x10 Knees extended glute set 2x10 Clamshell isometrics against belt 2x10 Prone Eccentric hamstring curl red TB 2x10  Eccentric hip IR red TB 2x10 Standing Captain morgans 10x5 sec Heel raise 2x10     PATIENT EDUCATION:  Education details: Exam findings, POC, initial HEP Person educated: Patient Education method: Explanation, Demonstration, and Handouts Education comprehension: verbalized understanding, returned demonstration, and needs further education  HOME EXERCISE PROGRAM: Access Code: [email or web address] URL: [email or web address] Date: 02/07/2023 Prepared by: Vernon Prey April Kirstie Peri  Exercises - Modified Prone Quadriceps Stretch with Strap  - 1 x daily - 7 x weekly - 2 sets - 10 reps - Supine Figure 4  - 1 x daily - 7 x weekly - 2 sets - 10 reps - Supine Heel Slide with Strap  - 1 x daily - 7 x weekly - 2 sets - 10 reps - Supine Lower Trunk Rotation  - 1 x daily - 7 x weekly - 2 sets - 10 reps  ASSESSMENT:  CLINICAL IMPRESSION: Continued strength progressions. Able to tolerate 40# on leg press performing eccentrics. Has continued R hip flexor tightness -- discussed self massage/manual work to address.   From eval: Patient is a 67 y.o. F who was seen today for physical therapy evaluation and treatment for R hamstring tear and glute/hamstring tendinosis. Assessment significant for weak R hip and decreased stability. Per Dr. Melvia Heaps referral, PT to start with PROM x1 week and then AROM for the next 1-2 weeks before initiating eccentric strengthening. Pt will benefit from PT to recover from her injury and return to PLOF. Initiated gentle PROM/AAROM this session for hip mobility with good pt tolerance.   OBJECTIVE IMPAIRMENTS: decreased activity tolerance, decreased balance, decreased endurance, decreased  mobility, difficulty walking, decreased ROM, decreased strength, increased edema, increased fascial restrictions, impaired flexibility, improper body mechanics, and pain.    GOALS: Goals reviewed with patient? Yes  SHORT TERM GOALS: Target date: 03/07/2023  Pt will be ind with initial HEP Baseline: Goal status: INITIAL  2.  Pt will demo L = R knee AROM to demo improving hamstring mobility Baseline:  Goal status: INITIAL   LONG TERM GOALS: Target date: 04/04/2023   Pt will be ind with management and progression of HEP Baseline:  Goal status: INITIAL  2.  Pt will be able to tolerate single leg stability on R LE x 30 sec Baseline:  Goal status: INITIAL  3.  Pt will be able to walk at least 3 miles without exacerbation of pain to >/=1/10 Baseline:  Goal status: INITIAL  4.  Pt will be able to squat and lift at least 10# to demo increased bilat LE strength Baseline:  Goal status: INITIAL  5.  Pt will have increased LEFS to >/=70/80 to demo MCID Baseline:  Goal status: INITIAL    PLAN:  PT FREQUENCY: 1-2x/week  PT DURATION: 8 weeks  PLANNED INTERVENTIONS: Therapeutic exercises, Therapeutic activity, Neuromuscular re-education, Balance training, Gait training, Patient/Family education, Self Care, Joint mobilization, Aquatic Therapy, Dry Needling, Electrical stimulation, Cryotherapy, Moist heat, Taping, Vasopneumatic device, Ionotophoresis 4mg /ml Dexamethasone, Manual therapy, and Re-evaluation  PLAN FOR NEXT SESSION: Assess response to HEP. Follow precautions. Continue to work progress strengthening and eccentrics   Soraya Paquette April Ma L [place], PT 02/28/2023, 11:04 AM

## 2023-03-02 ENCOUNTER — Encounter: Payer: Self-pay | Admitting: Physical Therapy

## 2023-03-02 ENCOUNTER — Ambulatory Visit: Payer: Medicare PPO | Admitting: Physical Therapy

## 2023-03-02 DIAGNOSIS — M6281 Muscle weakness (generalized): Secondary | ICD-10-CM | POA: Diagnosis not present

## 2023-03-02 DIAGNOSIS — R2689 Other abnormalities of gait and mobility: Secondary | ICD-10-CM | POA: Diagnosis not present

## 2023-03-02 DIAGNOSIS — M25651 Stiffness of right hip, not elsewhere classified: Secondary | ICD-10-CM | POA: Diagnosis not present

## 2023-03-02 DIAGNOSIS — M25551 Pain in right hip: Secondary | ICD-10-CM | POA: Diagnosis not present

## 2023-03-02 DIAGNOSIS — S76311D Strain of muscle, fascia and tendon of the posterior muscle group at thigh level, right thigh, subsequent encounter: Secondary | ICD-10-CM | POA: Diagnosis not present

## 2023-03-02 NOTE — Therapy (Signed)
OUTPATIENT PHYSICAL THERAPY LOWER EXTREMITY TREATMENT   Patient Name: Becky Bowers MRN: [id number] DOB:22-Mar-1956, 67 y.o., female Today's Date: 03/02/2023  END OF SESSION:  PT End of Session - 03/02/23 1102     Visit Number 7    Number of Visits 16    Date for PT Re-Evaluation 04/04/23    Authorization Type [employer]    PT Start Time 1102    PT Stop Time 1145    PT Time Calculation (min) 43 min    Activity Tolerance Patient tolerated treatment well               Past Medical History:  Diagnosis Date   Arthritis    Concussion 09/12/2019   Past Surgical History:  Procedure Laterality Date   APPENDECTOMY  1968   DILATION AND CURETTAGE OF UTERUS  1990   TONSILLECTOMY  1980   Patient Active Problem List   Diagnosis Date Noted   Tear of right hamstring 02/03/2023   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   Elevated LDL cholesterol level 04/14/2021   Polyarthralgia 03/17/2021   Primary osteoarthritis right first [facility] and right second MCP 04/24/2018   Numbness of right third and fourth toes 03/19/2018   Primary osteoarthritis of both hips 12/08/2015   Headache 08/12/2010   Lumbar degenerative disc disease 01/07/2008    PCP: Agapito Games, MD  REFERRING PROVIDER: Monica Becton, MD  REFERRING DIAG: [phone] (ICD-10-CM) - Tear of right hamstring  THERAPY DIAG:  Pain in right hip  Stiffness of right hip, not elsewhere classified  Muscle weakness (generalized)  Other abnormalities of gait and mobility  Rationale for Evaluation and Treatment: Rehabilitation  ONSET DATE: Around May 16  SUBJECTIVE:   SUBJECTIVE STATEMENT: Pt reports she definitely felt it more after last session. She states she was more sore yesterday. Could tell she did more. Feeling better today. Has noted that her pain is more along the adductors vs the hip flexors.   From eval: Pt states she had an inner ear issue and lost her balance. Pt was  standing putting together a rocking chair and slipped/jerked her body and felt something pop in her R posterior hip. Inner ear stuff got cleared up but had continued R posterior hip pain. Pt was walking 3.5 miles/day (has not been walking). Does pilates normally and stopped for now. Pain in general has been decreasing.   PERTINENT HISTORY: No history of injuries  PAIN:  Are you having pain? Yes: NPRS scale: 1 currently, at worst 3/10 Pain location: R posterior hip, radiates to groin area Pain description: dull ache Aggravating factors: Prolonged sitting, mornings getting out of bed  Relieving factors: Ice  PRECAUTIONS:  From Dr. Melvia Heaps referral notes: Patient is a right biceps femoris tear, minimal retraction, ischiogluteal bursitis, needs to work on passive range of motion for a week, active range of motion for 1 to 2 weeks, and then ECCENTRIC strengthening. Please minimize stretching at least for the first couple of weeks.  WEIGHT BEARING RESTRICTIONS: No  FALLS:  Has patient fallen in last 6 months? No  LIVING ENVIRONMENT: Lives with: lives with their spouse Lives in: House/apartment Stairs: Yes: External: 2 steps; none Has following equipment at home: None  OCCUPATION: Retired  PLOF: Independent  PATIENT GOALS: Return to all normal functions  NEXT MD VISIT: August  OBJECTIVE:   DIAGNOSTIC FINDINGS: MRI 01/21/23 IMPRESSION: 1. Right hamstring tendinosis with a full-thickness minimally retracted tear of the biceps femoris tendon origin with  peritendinous edema. 2. Moderate volume fluid within the right ischiogluteal bursa. 3. Mild tendinosis of the contralateral left hamstring origin. 4. Mild tendinosis of the bilateral gluteus medius and minimus tendons without tear. 5. Mild osteoarthritis of the right hip.  PATIENT SURVEYS:  LEFS 60/80 = 75%  EDEMA:  Mild in R buttock  MUSCLE LENGTH: Hamstrings: deferred per precautions Thomas test: WNL  POSTURE:  bilat knees  slightly bent  PALPATION: R glute, piriformis tenderness  LOWER EXTREMITY ROM:  Active ROM Right eval Left eval  Hip flexion    Hip extension    Hip abduction    Hip adduction    Hip internal rotation    Hip external rotation    Knee flexion 120 135  Knee extension 0 0  Ankle dorsiflexion    Ankle plantarflexion    Ankle inversion    Ankle eversion     (Blank rows = not tested)  LOWER EXTREMITY MMT:  MMT Right eval Left eval  Hip flexion 5 5  Hip extension 3- 4  Hip abduction 4- 4  Hip adduction    Hip internal rotation deferred 4  Hip external rotation deferred 3+  Knee flexion deferred 5  Knee extension 5 5  Ankle dorsiflexion    Ankle plantarflexion    Ankle inversion    Ankle eversion     (Blank rows = not tested)  LOWER EXTREMITY SPECIAL TESTS:  Hip special tests: Luisa Hart (FABER) test: positive , Thomas test: negative, and Ely's test: negative  FUNCTIONAL TESTS:  Deferred  GAIT: Mildly antalgic on R with knee remaining slightly flexed   TODAY'S TREATMENT:    [facility] Adult PT Treatment:                                                DATE: 03/02/23 Therapeutic Exercise: Recumbent bike L3 x 5 min Seated adductor stretch x 30 sec Supine Pball knee flexion with bridge eccentrics 2x10 Pball knee extended bridge red TB around knees 2x10 Prone Eccentric hamstring curl green TB 2x10 Standing X band walk red TB 2x10 Wall squat with red TB around knees 10x3 sec Manual Therapy: IASTM adductors  [facility] Adult PT Treatment:                                                DATE: 02/28/23 Therapeutic Exercise: Recumbent bike L3 x 5 min Supine Pball knee flexion with heel press down 2x10 Pball knee flexion with bridge eccentrics 2x10 Pball knee extended bridge 2x10 SLR red TB 2x10 Prone Ball mobilization on psoas Hip ext with knee flexed 2x10 Eccentric hamstring curl red TB 2x10 Eccentric leg press 40# 2x10 Standing X band walk red WOD band 2x10 Wall squat  3x10 sec   [facility] Adult PT Treatment:                                                DATE: 02/23/23 Therapeutic Exercise: Recumbent bike L2 x 5 min Supine Pball knee flexion with heel press down 3x10 Pball knee extended bridge 2x10 Clamshell iso with bridging 2x10 Hip abd red  TB 2x10 SLR red TB 2x10 Prone Eccentric hamstring curl red TB 2x10 Eccentric hip IR red TB 2x10 Prone ball squeeze between heels 2x10 Standing Glute iso press down on 4" step 2x10x3 sec    PATIENT EDUCATION:  Education details: Exam findings, POC, initial HEP Person educated: Patient Education method: Explanation, Demonstration, and Handouts Education comprehension: verbalized understanding, returned demonstration, and needs further education  HOME EXERCISE PROGRAM: Access Code: [email or web address] URL: [email or web address] Date: 02/07/2023 Prepared by: Vernon Prey April Kirstie Peri  Exercises - Modified Prone Quadriceps Stretch with Strap  - 1 x daily - 7 x weekly - 2 sets - 10 reps - Supine Figure 4  - 1 x daily - 7 x weekly - 2 sets - 10 reps - Supine Heel Slide with Strap  - 1 x daily - 7 x weekly - 2 sets - 10 reps - Supine Lower Trunk Rotation  - 1 x daily - 7 x weekly - 2 sets - 10 reps  ASSESSMENT:  CLINICAL IMPRESSION: Manual work to address groin pain. Continued strengthening as tolerated. Reviewed HEP -- discussed when to perform her progressions and when to decrease intensity if she's too sore.   From eval: Patient is a 67 y.o. F who was seen today for physical therapy evaluation and treatment for R hamstring tear and glute/hamstring tendinosis. Assessment significant for weak R hip and decreased stability. Per Dr. Melvia Heaps referral, PT to start with PROM x1 week and then AROM for the next 1-2 weeks before initiating eccentric strengthening. Pt will benefit from PT to recover from her injury and return to PLOF. Initiated gentle PROM/AAROM this session for hip mobility with good pt tolerance.    OBJECTIVE IMPAIRMENTS: decreased activity tolerance, decreased balance, decreased endurance, decreased mobility, difficulty walking, decreased ROM, decreased strength, increased edema, increased fascial restrictions, impaired flexibility, improper body mechanics, and pain.    GOALS: Goals reviewed with patient? Yes  SHORT TERM GOALS: Target date: 03/07/2023  Pt will be ind with initial HEP Baseline: Goal status: INITIAL  2.  Pt will demo L = R knee AROM to demo improving hamstring mobility Baseline:  Goal status: INITIAL   LONG TERM GOALS: Target date: 04/04/2023   Pt will be ind with management and progression of HEP Baseline:  Goal status: INITIAL  2.  Pt will be able to tolerate single leg stability on R LE x 30 sec Baseline:  Goal status: INITIAL  3.  Pt will be able to walk at least 3 miles without exacerbation of pain to >/=1/10 Baseline:  Goal status: INITIAL  4.  Pt will be able to squat and lift at least 10# to demo increased bilat LE strength Baseline:  Goal status: INITIAL  5.  Pt will have increased LEFS to >/=70/80 to demo MCID Baseline:  Goal status: INITIAL    PLAN:  PT FREQUENCY: 1-2x/week  PT DURATION: 8 weeks  PLANNED INTERVENTIONS: Therapeutic exercises, Therapeutic activity, Neuromuscular re-education, Balance training, Gait training, Patient/Family education, Self Care, Joint mobilization, Aquatic Therapy, Dry Needling, Electrical stimulation, Cryotherapy, Moist heat, Taping, Vasopneumatic device, Ionotophoresis 4mg /ml Dexamethasone, Manual therapy, and Re-evaluation  PLAN FOR NEXT SESSION: Assess response to HEP. Follow precautions. Continue to work progress strengthening and eccentrics   Quinlee Sciarra April Ma L [place], PT 03/02/2023, 11:02 AM

## 2023-03-07 ENCOUNTER — Ambulatory Visit: Payer: Medicare PPO | Admitting: Physical Therapy

## 2023-03-07 ENCOUNTER — Encounter: Payer: Self-pay | Admitting: Physical Therapy

## 2023-03-07 DIAGNOSIS — M25651 Stiffness of right hip, not elsewhere classified: Secondary | ICD-10-CM

## 2023-03-07 DIAGNOSIS — M25551 Pain in right hip: Secondary | ICD-10-CM | POA: Diagnosis not present

## 2023-03-07 DIAGNOSIS — S76311D Strain of muscle, fascia and tendon of the posterior muscle group at thigh level, right thigh, subsequent encounter: Secondary | ICD-10-CM | POA: Diagnosis not present

## 2023-03-07 DIAGNOSIS — R2689 Other abnormalities of gait and mobility: Secondary | ICD-10-CM

## 2023-03-07 DIAGNOSIS — M6281 Muscle weakness (generalized): Secondary | ICD-10-CM

## 2023-03-07 NOTE — Therapy (Signed)
OUTPATIENT PHYSICAL THERAPY LOWER EXTREMITY TREATMENT   Patient Name: Becky Bowers MRN: [id number] DOB:01/25/1956, 67 y.o., female Today's Date: 03/07/2023  END OF SESSION:  PT End of Session - 03/07/23 1057     Visit Number 8    Number of Visits 16    Date for PT Re-Evaluation 04/04/23    Authorization Type [employer]    PT Start Time 1100    PT Stop Time 1140    PT Time Calculation (min) 40 min    Activity Tolerance Patient tolerated treatment well               Past Medical History:  Diagnosis Date   Arthritis    Concussion 09/12/2019   Past Surgical History:  Procedure Laterality Date   APPENDECTOMY  1968   DILATION AND CURETTAGE OF UTERUS  1990   TONSILLECTOMY  1980   Patient Active Problem List   Diagnosis Date Noted   Tear of right hamstring 02/03/2023   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   Elevated LDL cholesterol level 04/14/2021   Polyarthralgia 03/17/2021   Primary osteoarthritis right first [facility] and right second MCP 04/24/2018   Numbness of right third and fourth toes 03/19/2018   Primary osteoarthritis of both hips 12/08/2015   Headache 08/12/2010   Lumbar degenerative disc disease 01/07/2008    PCP: Agapito Games, MD  REFERRING PROVIDER: Monica Becton, MD  REFERRING DIAG: [phone] (ICD-10-CM) - Tear of right hamstring  THERAPY DIAG:  Pain in right hip  Stiffness of right hip, not elsewhere classified  Muscle weakness (generalized)  Other abnormalities of gait and mobility  Rationale for Evaluation and Treatment: Rehabilitation  ONSET DATE: Around May 16  SUBJECTIVE:   SUBJECTIVE STATEMENT: Pt states she knows she over did it helping her mother move on Saturday. Rested on Sunday and felt better Monday. Still feeling it along her groin  From eval: Pt states she had an inner ear issue and lost her balance. Pt was standing putting together a rocking chair and slipped/jerked her body and felt  something pop in her R posterior hip. Inner ear stuff got cleared up but had continued R posterior hip pain. Pt was walking 3.5 miles/day (has not been walking). Does pilates normally and stopped for now. Pain in general has been decreasing.   PERTINENT HISTORY: No history of injuries  PAIN:  Are you having pain? Yes: NPRS scale: 1 currently, at worst 3/10 Pain location: R posterior hip, radiates to groin area Pain description: dull ache Aggravating factors: Prolonged sitting, mornings getting out of bed  Relieving factors: Ice  PRECAUTIONS:  From Dr. Melvia Heaps referral notes: Patient is a right biceps femoris tear, minimal retraction, ischiogluteal bursitis, needs to work on passive range of motion for a week, active range of motion for 1 to 2 weeks, and then ECCENTRIC strengthening. Please minimize stretching at least for the first couple of weeks.  WEIGHT BEARING RESTRICTIONS: No  FALLS:  Has patient fallen in last 6 months? No  LIVING ENVIRONMENT: Lives with: lives with their spouse Lives in: House/apartment Stairs: Yes: External: 2 steps; none Has following equipment at home: None  OCCUPATION: Retired  PLOF: Independent  PATIENT GOALS: Return to all normal functions  NEXT MD VISIT: August  OBJECTIVE:   DIAGNOSTIC FINDINGS: MRI 01/21/23 IMPRESSION: 1. Right hamstring tendinosis with a full-thickness minimally retracted tear of the biceps femoris tendon origin with peritendinous edema. 2. Moderate volume fluid within the right ischiogluteal bursa. 3. Mild  tendinosis of the contralateral left hamstring origin. 4. Mild tendinosis of the bilateral gluteus medius and minimus tendons without tear. 5. Mild osteoarthritis of the right hip.  PATIENT SURVEYS:  LEFS 60/80 = 75%  EDEMA:  Mild in R buttock  MUSCLE LENGTH: Hamstrings: deferred per precautions Thomas test: WNL  POSTURE:  bilat knees slightly bent  PALPATION: R glute, piriformis tenderness  LOWER EXTREMITY  ROM:  Active ROM Right eval Left eval  Hip flexion    Hip extension    Hip abduction    Hip adduction    Hip internal rotation    Hip external rotation    Knee flexion 120 135  Knee extension 0 0  Ankle dorsiflexion    Ankle plantarflexion    Ankle inversion    Ankle eversion     (Blank rows = not tested)  LOWER EXTREMITY MMT:  MMT Right eval Left eval  Hip flexion 5 5  Hip extension 3- 4  Hip abduction 4- 4  Hip adduction    Hip internal rotation deferred 4  Hip external rotation deferred 3+  Knee flexion deferred 5  Knee extension 5 5  Ankle dorsiflexion    Ankle plantarflexion    Ankle inversion    Ankle eversion     (Blank rows = not tested)  LOWER EXTREMITY SPECIAL TESTS:  Hip special tests: Luisa Hart (FABER) test: positive , Thomas test: negative, and Ely's test: negative  FUNCTIONAL TESTS:  Deferred  GAIT: Mildly antalgic on R with knee remaining slightly flexed   TODAY'S TREATMENT:    [facility] Adult PT Treatment:                                                DATE: 03/07/23 Therapeutic Exercise: Recumbent bike L3 x 5 min Supine Pball knee flexion with bridge eccentrics 2x10 Pball knee flexion/ext maintaining bridge x5 Prone Eccentric hamstring curl blue TB 2x10 Reverse clam blue TB 2x10 Sidelying Clamshell blue TB 3x10 Femoral nerve glide x10 Standing (no hamstring wrap) 3 way hip on slider 2x10   [facility] Adult PT Treatment:                                                DATE: 03/02/23 Therapeutic Exercise: Recumbent bike L3 x 5 min Seated adductor stretch x 30 sec Supine Pball knee flexion with bridge eccentrics 2x10 Pball knee extended bridge red TB around knees 2x10 Prone Eccentric hamstring curl green TB 2x10 Standing X band walk red TB 2x10 Wall squat with red TB around knees 10x3 sec Manual Therapy: IASTM adductors  [facility] Adult PT Treatment:                                                DATE: 02/28/23 Therapeutic Exercise: Recumbent  bike L3 x 5 min Supine Pball knee flexion with heel press down 2x10 Pball knee flexion with bridge eccentrics 2x10 Pball knee extended bridge 2x10 SLR red TB 2x10 Prone Ball mobilization on psoas Hip ext with knee flexed 2x10 Eccentric hamstring curl red TB 2x10 Eccentric leg press 40# 2x10 Standing X  band walk red WOD band 2x10 Wall squat 3x10 sec   PATIENT EDUCATION:  Education details: Exam findings, POC, initial HEP Person educated: Patient Education method: Explanation, Demonstration, and Handouts Education comprehension: verbalized understanding, returned demonstration, and needs further education  HOME EXERCISE PROGRAM: Access Code: [email or web address] URL: [email or web address] Date: 03/07/2023 Prepared by: Vernon Prey April Kirstie Peri  Exercises - Prone Hamstring Curl with Anchored Resistance  - 1 x daily - 7 x weekly - 2 sets - 10 reps - Modified Thomas Stretch  - 1 x daily - 7 x weekly - 2 sets - 30 sec hold - X Band Walk  - 1 x daily - 7 x weekly - 2 sets - 10 reps - Supine Bridge with Resistance Band  - 1 x daily - 7 x weekly - 2 sets - 10 reps - Bridge with [employer] on [employer]  - 1 x daily - 7 x weekly - 2 sets - 10 reps - Wall Squat with Resistance Loop  - 1 x daily - 7 x weekly - 1 sets - 10 reps - 3 sec hold - Sidelying Femoral Nerve Glide - Top Leg  - 1 x daily - 7 x weekly - 1 sets - 10 reps - Step Backward with Slider  - 1 x daily - 7 x weekly - 2 sets - 10 reps - Step Sideways with Slider  - 1 x daily - 7 x weekly - 2 sets - 10 reps - Step Forward with Furniture Slider  - 1 x daily - 7 x weekly - 2 sets - 10 reps  ASSESSMENT:  CLINICAL IMPRESSION: Pt able to progress to blue theraband. Worked on LE stability without compression wrap -- pt notes increased N/T ascending down medial thigh/knee into medial calf. Performed femoral nerve glides to try and address.   From eval: Patient is a 67 y.o. F who was seen today for physical therapy  evaluation and treatment for R hamstring tear and glute/hamstring tendinosis. Assessment significant for weak R hip and decreased stability. Per Dr. Melvia Heaps referral, PT to start with PROM x1 week and then AROM for the next 1-2 weeks before initiating eccentric strengthening. Pt will benefit from PT to recover from her injury and return to PLOF. Initiated gentle PROM/AAROM this session for hip mobility with good pt tolerance.   OBJECTIVE IMPAIRMENTS: decreased activity tolerance, decreased balance, decreased endurance, decreased mobility, difficulty walking, decreased ROM, decreased strength, increased edema, increased fascial restrictions, impaired flexibility, improper body mechanics, and pain.    GOALS: Goals reviewed with patient? Yes  SHORT TERM GOALS: Target date: 03/07/2023  Pt will be ind with initial HEP Baseline: Goal status: INITIAL  2.  Pt will demo L = R knee AROM to demo improving hamstring mobility Baseline:  Goal status: INITIAL   LONG TERM GOALS: Target date: 04/04/2023   Pt will be ind with management and progression of HEP Baseline:  Goal status: INITIAL  2.  Pt will be able to tolerate single leg stability on R LE x 30 sec Baseline:  Goal status: INITIAL  3.  Pt will be able to walk at least 3 miles without exacerbation of pain to >/=1/10 Baseline:  Goal status: INITIAL  4.  Pt will be able to squat and lift at least 10# to demo increased bilat LE strength Baseline:  Goal status: INITIAL  5.  Pt will have increased LEFS to >/=70/80 to demo MCID Baseline:  Goal status: INITIAL    PLAN:  PT  FREQUENCY: 1-2x/week  PT DURATION: 8 weeks  PLANNED INTERVENTIONS: Therapeutic exercises, Therapeutic activity, Neuromuscular re-education, Balance training, Gait training, Patient/Family education, Self Care, Joint mobilization, Aquatic Therapy, Dry Needling, Electrical stimulation, Cryotherapy, Moist heat, Taping, Vasopneumatic device, Ionotophoresis 4mg /ml  Dexamethasone, Manual therapy, and Re-evaluation  PLAN FOR NEXT SESSION: Assess response to HEP. Continue to progress strengthening and eccentrics   Zaylin Runco April Ma L Roston Grunewald, PT 03/07/2023, 10:57 AM

## 2023-03-09 ENCOUNTER — Ambulatory Visit: Payer: Medicare PPO | Attending: Sports Medicine | Admitting: Physical Therapy

## 2023-03-09 ENCOUNTER — Encounter: Payer: Self-pay | Admitting: Physical Therapy

## 2023-03-09 DIAGNOSIS — R2689 Other abnormalities of gait and mobility: Secondary | ICD-10-CM | POA: Insufficient documentation

## 2023-03-09 DIAGNOSIS — M25551 Pain in right hip: Secondary | ICD-10-CM | POA: Insufficient documentation

## 2023-03-09 DIAGNOSIS — M25651 Stiffness of right hip, not elsewhere classified: Secondary | ICD-10-CM | POA: Insufficient documentation

## 2023-03-09 DIAGNOSIS — M6281 Muscle weakness (generalized): Secondary | ICD-10-CM | POA: Diagnosis not present

## 2023-03-09 NOTE — Therapy (Signed)
OUTPATIENT PHYSICAL THERAPY LOWER EXTREMITY TREATMENT   Patient Name: Becky Bowers MRN: [id number] DOB:Dec 24, 1955, 67 y.o., female Today's Date: 03/09/2023  END OF SESSION:  PT End of Session - 03/09/23 1055     Visit Number 9    Number of Visits 16    Date for PT Re-Evaluation 04/04/23    Authorization Type [employer]    PT Start Time 1100    PT Stop Time 1140    PT Time Calculation (min) 40 min    Activity Tolerance Patient tolerated treatment well                Past Medical History:  Diagnosis Date   Arthritis    Concussion 09/12/2019   Past Surgical History:  Procedure Laterality Date   APPENDECTOMY  1968   DILATION AND CURETTAGE OF UTERUS  1990   TONSILLECTOMY  1980   Patient Active Problem List   Diagnosis Date Noted   Tear of right hamstring 02/03/2023   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   Elevated LDL cholesterol level 04/14/2021   Polyarthralgia 03/17/2021   Primary osteoarthritis right first [facility] and right second MCP 04/24/2018   Numbness of right third and fourth toes 03/19/2018   Primary osteoarthritis of both hips 12/08/2015   Headache 08/12/2010   Lumbar degenerative disc disease 01/07/2008    PCP: Agapito Games, MD  REFERRING PROVIDER: Monica Becton, MD  REFERRING DIAG: [phone] (ICD-10-CM) - Tear of right hamstring  THERAPY DIAG:  Pain in right hip  Stiffness of right hip, not elsewhere classified  Muscle weakness (generalized)  Other abnormalities of gait and mobility  Rationale for Evaluation and Treatment: Rehabilitation  ONSET DATE: Around May 16  SUBJECTIVE:   SUBJECTIVE STATEMENT: Pt states she did hurt  after last PT session. Recovered after 1 day and pain is back at baseline. Was able to do her exercises yesterday.   From eval: Pt states she had an inner ear issue and lost her balance. Pt was standing putting together a rocking chair and slipped/jerked her body and felt  something pop in her R posterior hip. Inner ear stuff got cleared up but had continued R posterior hip pain. Pt was walking 3.5 miles/day (has not been walking). Does pilates normally and stopped for now. Pain in general has been decreasing.   PERTINENT HISTORY: No history of injuries  PAIN:  Are you having pain? Yes: NPRS scale: 1 currently, at worst 3/10 Pain location: R posterior hip, radiates to groin area Pain description: dull ache Aggravating factors: Prolonged sitting, mornings getting out of bed  Relieving factors: Ice  PRECAUTIONS:  From Dr. Melvia Heaps referral notes: Patient is a right biceps femoris tear, minimal retraction, ischiogluteal bursitis, needs to work on passive range of motion for a week, active range of motion for 1 to 2 weeks, and then ECCENTRIC strengthening. Please minimize stretching at least for the first couple of weeks.  WEIGHT BEARING RESTRICTIONS: No  FALLS:  Has patient fallen in last 6 months? No  LIVING ENVIRONMENT: Lives with: lives with their spouse Lives in: House/apartment Stairs: Yes: External: 2 steps; none Has following equipment at home: None  OCCUPATION: Retired  PLOF: Independent  PATIENT GOALS: Return to all normal functions  NEXT MD VISIT: August  OBJECTIVE:   DIAGNOSTIC FINDINGS: MRI 01/21/23 IMPRESSION: 1. Right hamstring tendinosis with a full-thickness minimally retracted tear of the biceps femoris tendon origin with peritendinous edema. 2. Moderate volume fluid within the right ischiogluteal bursa.  3. Mild tendinosis of the contralateral left hamstring origin. 4. Mild tendinosis of the bilateral gluteus medius and minimus tendons without tear. 5. Mild osteoarthritis of the right hip.  PATIENT SURVEYS:  LEFS 60/80 = 75%  EDEMA:  Mild in R buttock  MUSCLE LENGTH: Hamstrings: deferred per precautions Thomas test: WNL  POSTURE:  bilat knees slightly bent  PALPATION: R glute, piriformis tenderness  LOWER EXTREMITY  ROM:  Active ROM Right eval Left eval Right 03/09/23 Left 03/09/23  Hip flexion      Hip extension      Hip abduction      Hip adduction      Hip internal rotation      Hip external rotation      Knee flexion 120 135 125 125  Knee extension 0 0 0 0  Ankle dorsiflexion      Ankle plantarflexion      Ankle inversion      Ankle eversion       (Blank rows = not tested)  LOWER EXTREMITY MMT:  MMT Right eval Left eval  Hip flexion 5 5  Hip extension 3- 4  Hip abduction 4- 4  Hip adduction    Hip internal rotation deferred 4  Hip external rotation deferred 3+  Knee flexion deferred 5  Knee extension 5 5  Ankle dorsiflexion    Ankle plantarflexion    Ankle inversion    Ankle eversion     (Blank rows = not tested)  LOWER EXTREMITY SPECIAL TESTS:  Hip special tests: Luisa Hart (FABER) test: positive , Thomas test: negative, and Ely's test: negative  FUNCTIONAL TESTS:  Deferred  GAIT: Mildly antalgic on R with knee remaining slightly flexed   TODAY'S TREATMENT:    [facility] Adult PT Treatment:                                                DATE: 03/09/23 Therapeutic Exercise: Recumbent bike L3 x 5 min Supine hamstring stretch with strap x30 sec Supine adductor stretch with strap x 30 sec Supine ITB stretch with strap x30 sec Sidelying femoral nerve stretch x 10 Half kneeling hip flexor stretch x1 min  Half kneeling rock forward/backward x10 Standing 3 way hip 2x10 Leg press eccentrics double leg 2x10 40# Sidelying clamshell blue TB 2x10 Sidelying reverse clamshell blue TB 2x10   [facility] Adult PT Treatment:                                                DATE: 03/07/23 Therapeutic Exercise: Recumbent bike L3 x 5 min Supine Pball knee flexion with bridge eccentrics 2x10 Pball knee flexion/ext maintaining bridge x5 Prone Eccentric hamstring curl blue TB 2x10 Reverse clam blue TB 2x10 Sidelying Clamshell blue TB 3x10 Femoral nerve glide x10 Standing (no hamstring wrap) 3  way hip on slider 2x10   [facility] Adult PT Treatment:                                                DATE: 03/02/23 Therapeutic Exercise: Recumbent bike L3 x 5 min Seated adductor stretch x  30 sec Supine Pball knee flexion with bridge eccentrics 2x10 Pball knee extended bridge red TB around knees 2x10 Prone Eccentric hamstring curl green TB 2x10 Standing X band walk red TB 2x10 Wall squat with red TB around knees 10x3 sec Manual Therapy: IASTM adductors   PATIENT EDUCATION:  Education details: Exam findings, POC, initial HEP Person educated: Patient Education method: Explanation, Demonstration, and Handouts Education comprehension: verbalized understanding, returned demonstration, and needs further education  HOME EXERCISE PROGRAM: Access Code: [email or web address] URL: [email or web address] Date: 03/07/2023 Prepared by: Vernon Prey April Kirstie Peri  Exercises - Prone Hamstring Curl with Anchored Resistance  - 1 x daily - 7 x weekly - 2 sets - 10 reps - Modified Thomas Stretch  - 1 x daily - 7 x weekly - 2 sets - 30 sec hold - X Band Walk  - 1 x daily - 7 x weekly - 2 sets - 10 reps - Supine Bridge with Resistance Band  - 1 x daily - 7 x weekly - 2 sets - 10 reps - Bridge with [employer] on [employer]  - 1 x daily - 7 x weekly - 2 sets - 10 reps - Wall Squat with Resistance Loop  - 1 x daily - 7 x weekly - 1 sets - 10 reps - 3 sec hold - Sidelying Femoral Nerve Glide - Top Leg  - 1 x daily - 7 x weekly - 1 sets - 10 reps - Step Backward with Slider  - 1 x daily - 7 x weekly - 2 sets - 10 reps - Step Sideways with Slider  - 1 x daily - 7 x weekly - 2 sets - 10 reps - Step Forward with Furniture Slider  - 1 x daily - 7 x weekly - 2 sets - 10 reps  ASSESSMENT:  CLINICAL IMPRESSION: Less N/T after performing half kneeling hip flexor stretch. Continued progressive strengthening. Added a few more stretches.   From eval: Patient is a 67 y.o. F who was seen today for physical  therapy evaluation and treatment for R hamstring tear and glute/hamstring tendinosis. Assessment significant for weak R hip and decreased stability. Per Dr. Melvia Heaps referral, PT to start with PROM x1 week and then AROM for the next 1-2 weeks before initiating eccentric strengthening. Pt will benefit from PT to recover from her injury and return to PLOF. Initiated gentle PROM/AAROM this session for hip mobility with good pt tolerance.   OBJECTIVE IMPAIRMENTS: decreased activity tolerance, decreased balance, decreased endurance, decreased mobility, difficulty walking, decreased ROM, decreased strength, increased edema, increased fascial restrictions, impaired flexibility, improper body mechanics, and pain.    GOALS: Goals reviewed with patient? Yes  SHORT TERM GOALS: Target date: 03/07/2023  Pt will be ind with initial HEP Baseline: Goal status: MET  2.  Pt will demo L = R knee AROM to demo improving hamstring mobility Baseline:  Goal status: INITIAL   LONG TERM GOALS: Target date: 04/04/2023   Pt will be ind with management and progression of HEP Baseline:  Goal status: INITIAL  2.  Pt will be able to tolerate single leg stability on R LE x 30 sec Baseline:  Goal status: INITIAL  3.  Pt will be able to walk at least 3 miles without exacerbation of pain to >/=1/10 Baseline:  Goal status: INITIAL  4.  Pt will be able to squat and lift at least 10# to demo increased bilat LE strength Baseline:  Goal status: INITIAL  5.  Pt will  have increased LEFS to >/=70/80 to demo MCID Baseline:  Goal status: INITIAL    PLAN:  PT FREQUENCY: 1-2x/week  PT DURATION: 8 weeks  PLANNED INTERVENTIONS: Therapeutic exercises, Therapeutic activity, Neuromuscular re-education, Balance training, Gait training, Patient/Family education, Self Care, Joint mobilization, Aquatic Therapy, Dry Needling, Electrical stimulation, Cryotherapy, Moist heat, Taping, Vasopneumatic device, Ionotophoresis 4mg /ml  Dexamethasone, Manual therapy, and Re-evaluation  PLAN FOR NEXT SESSION: Assess response to HEP. Continue to progress strengthening and eccentrics   Verl Whitmore April Ma L Kay Shippy, PT 03/09/2023, 10:56 AM

## 2023-03-14 ENCOUNTER — Ambulatory Visit: Payer: Medicare PPO | Admitting: Physical Therapy

## 2023-03-14 ENCOUNTER — Encounter: Payer: Self-pay | Admitting: Physical Therapy

## 2023-03-14 DIAGNOSIS — M25551 Pain in right hip: Secondary | ICD-10-CM

## 2023-03-14 DIAGNOSIS — M6281 Muscle weakness (generalized): Secondary | ICD-10-CM

## 2023-03-14 DIAGNOSIS — M25651 Stiffness of right hip, not elsewhere classified: Secondary | ICD-10-CM

## 2023-03-14 DIAGNOSIS — R2689 Other abnormalities of gait and mobility: Secondary | ICD-10-CM

## 2023-03-14 NOTE — Therapy (Signed)
OUTPATIENT PHYSICAL THERAPY LOWER EXTREMITY TREATMENT   Patient Name: Becky Bowers MRN: [id number] DOB:01/24/1956, 67 y.o., female Today's Date: 03/14/2023  END OF SESSION:  PT End of Session - 03/14/23 1055     Visit Number 10    Number of Visits 16    Date for PT Re-Evaluation 04/04/23    Authorization Type [employer]    PT Start Time 1100    PT Stop Time 1140    PT Time Calculation (min) 40 min    Activity Tolerance Patient tolerated treatment well             Past Medical History:  Diagnosis Date   Arthritis    Concussion 09/12/2019   Past Surgical History:  Procedure Laterality Date   APPENDECTOMY  1968   DILATION AND CURETTAGE OF UTERUS  1990   TONSILLECTOMY  1980   Patient Active Problem List   Diagnosis Date Noted   Tear of right hamstring 02/03/2023   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   Elevated LDL cholesterol level 04/14/2021   Polyarthralgia 03/17/2021   Primary osteoarthritis right first [facility] and right second MCP 04/24/2018   Numbness of right third and fourth toes 03/19/2018   Primary osteoarthritis of both hips 12/08/2015   Headache 08/12/2010   Lumbar degenerative disc disease 01/07/2008    PCP: Agapito Games, MD  REFERRING PROVIDER: Monica Becton, MD  REFERRING DIAG: [phone] (ICD-10-CM) - Tear of right hamstring  THERAPY DIAG:  Pain in right hip  Stiffness of right hip, not elsewhere classified  Muscle weakness (generalized)  Other abnormalities of gait and mobility  Rationale for Evaluation and Treatment: Rehabilitation  ONSET DATE: Around May 16  SUBJECTIVE:   SUBJECTIVE STATEMENT: [place]. Did do exercises. Exercises were okay. Continues to have random moments of tingling in R LE. Pt unsure what causes this as it is variable and does go away.  PERTINENT HISTORY: No history of injuries From eval: Pt states she had an inner  ear issue and lost her balance. Pt was standing putting together a rocking chair and slipped/jerked her body and felt something pop in her R posterior hip. Inner ear stuff got cleared up but had continued R posterior hip pain. Pt was walking 3.5 miles/day (has not been walking). Does pilates normally and stopped for now. Pain in general has been decreasing.   PAIN:  Are you having pain? Yes: NPRS scale: 1 currently, at worst 3/10 Pain location: R groin area Pain description: dull ache Aggravating factors: Prolonged sitting, mornings getting out of bed  Relieving factors: Ice  PRECAUTIONS:  From Dr. Melvia Heaps referral notes: Patient is a right biceps femoris tear, minimal retraction, ischiogluteal bursitis, needs to work on passive range of motion for a week, active range of motion for 1 to 2 weeks, and then ECCENTRIC strengthening. Please minimize stretching at least for the first couple of weeks.  WEIGHT BEARING RESTRICTIONS: No  FALLS:  Has patient fallen in last 6 months? No  LIVING ENVIRONMENT: Lives with: lives with their spouse Lives in: House/apartment Stairs: Yes: External: 2 steps; none Has following equipment at home: None  OCCUPATION: Retired  PLOF: Independent  PATIENT GOALS: Return to all normal functions  NEXT MD VISIT: August  OBJECTIVE:   DIAGNOSTIC FINDINGS: MRI 01/21/23 IMPRESSION: 1. Right hamstring tendinosis with a full-thickness minimally retracted tear of the biceps femoris tendon origin with peritendinous edema. 2.  Moderate volume fluid within the right ischiogluteal bursa. 3. Mild tendinosis of the contralateral left hamstring origin. 4. Mild tendinosis of the bilateral gluteus medius and minimus tendons without tear. 5. Mild osteoarthritis of the right hip.  PATIENT SURVEYS:  LEFS 60/80 = 75%  EDEMA:  Mild in R buttock  MUSCLE LENGTH: Hamstrings: deferred per precautions Thomas test: WNL  POSTURE:  bilat knees slightly bent  PALPATION: R  glute, piriformis tenderness  LOWER EXTREMITY ROM:  Active ROM Right eval Left eval Right 03/09/23 Left 03/09/23  Hip flexion      Hip extension      Hip abduction      Hip adduction      Hip internal rotation      Hip external rotation      Knee flexion 120 135 125 125  Knee extension 0 0 0 0  Ankle dorsiflexion      Ankle plantarflexion      Ankle inversion      Ankle eversion       (Blank rows = not tested)  LOWER EXTREMITY MMT:  MMT Right eval Left eval  Hip flexion 5 5  Hip extension 3- 4  Hip abduction 4- 4  Hip adduction    Hip internal rotation deferred 4  Hip external rotation deferred 3+  Knee flexion deferred 5  Knee extension 5 5  Ankle dorsiflexion    Ankle plantarflexion    Ankle inversion    Ankle eversion     (Blank rows = not tested)  LOWER EXTREMITY SPECIAL TESTS:  Hip special tests: Luisa Hart (FABER) test: positive , Thomas test: negative, and Ely's test: negative  FUNCTIONAL TESTS:  Deferred  GAIT: Mildly antalgic on R with knee remaining slightly flexed   TODAY'S TREATMENT:    [facility] Adult PT Treatment:                                                DATE: 03/14/23 Therapeutic Exercise: Elliptical 2 min fwd, 2 min bwd Standing hamstring stretch x 30 sec Standing quad stretch x 30 sec Standing adductor stretch x 30 sec Standing hip flexor stretch x 30 sec Standing 3 way hip slide with foot on wash cloth yellow TB 2x10 Standing hamstring eccentrics yellow TB 2x10 Supine figure 4 stretch 2x30 sec Supine piriformis stretch 2x30 sec Sidelying femoral nerve glide x10 Manual Therapy: STM & TPR glute med, max, lateral hamstring   [facility] Adult PT Treatment:                                                DATE: 03/09/23 Therapeutic Exercise: Recumbent bike L3 x 5 min Supine hamstring stretch with strap x30 sec Supine adductor stretch with strap x 30 sec Supine ITB stretch with strap x30 sec Sidelying femoral nerve stretch x 10 Half kneeling hip  flexor stretch x1 min  Half kneeling rock forward/backward x10 Standing 3 way hip 2x10 Leg press eccentrics double leg 2x10 40# Sidelying clamshell blue TB 2x10 Sidelying reverse clamshell blue TB 2x10   [facility] Adult PT Treatment:  DATE: 03/07/23 Therapeutic Exercise: Recumbent bike L3 x 5 min Supine Pball knee flexion with bridge eccentrics 2x10 Pball knee flexion/ext maintaining bridge x5 Prone Eccentric hamstring curl blue TB 2x10 Reverse clam blue TB 2x10 Sidelying Clamshell blue TB 3x10 Femoral nerve glide x10 Standing (no hamstring wrap) 3 way hip on slider 2x10    PATIENT EDUCATION:  Education details: Exam findings, POC, initial HEP Person educated: Patient Education method: Explanation, Demonstration, and Handouts Education comprehension: verbalized understanding, returned demonstration, and needs further education  HOME EXERCISE PROGRAM: Access Code: [email or web address] URL: [email or web address] Date: 03/07/2023 Prepared by: Vernon Prey April Kirstie Peri  Exercises - Prone Hamstring Curl with Anchored Resistance  - 1 x daily - 7 x weekly - 2 sets - 10 reps - Modified Thomas Stretch  - 1 x daily - 7 x weekly - 2 sets - 30 sec hold - X Band Walk  - 1 x daily - 7 x weekly - 2 sets - 10 reps - Supine Bridge with Resistance Band  - 1 x daily - 7 x weekly - 2 sets - 10 reps - Bridge with [employer] on [employer]  - 1 x daily - 7 x weekly - 2 sets - 10 reps - Wall Squat with Resistance Loop  - 1 x daily - 7 x weekly - 1 sets - 10 reps - 3 sec hold - Sidelying Femoral Nerve Glide - Top Leg  - 1 x daily - 7 x weekly - 1 sets - 10 reps - Step Backward with Slider  - 1 x daily - 7 x weekly - 2 sets - 10 reps - Step Sideways with Slider  - 1 x daily - 7 x weekly - 2 sets - 10 reps - Step Forward with Furniture Slider  - 1 x daily - 7 x weekly - 2 sets - 10 reps  ASSESSMENT:  CLINICAL IMPRESSION: Treatment focused on  progressing pt to more standing exercises and stabilizing exercises. Still getting intermittent N/T in front of thigh to calf. Increased glute tightness addressed with gentle stretching and manual work.   From eval: Patient is a 67 y.o. F who was seen today for physical therapy evaluation and treatment for R hamstring tear and glute/hamstring tendinosis. Assessment significant for weak R hip and decreased stability. Per Dr. Melvia Heaps referral, PT to start with PROM x1 week and then AROM for the next 1-2 weeks before initiating eccentric strengthening. Pt will benefit from PT to recover from her injury and return to PLOF. Initiated gentle PROM/AAROM this session for hip mobility with good pt tolerance.   OBJECTIVE IMPAIRMENTS: decreased activity tolerance, decreased balance, decreased endurance, decreased mobility, difficulty walking, decreased ROM, decreased strength, increased edema, increased fascial restrictions, impaired flexibility, improper body mechanics, and pain.    GOALS: Goals reviewed with patient? Yes  SHORT TERM GOALS: Target date: 03/07/2023  Pt will be ind with initial HEP Baseline: Goal status: MET  2.  Pt will demo L = R knee AROM to demo improving hamstring mobility Baseline:  Goal status: MET   LONG TERM GOALS: Target date: 04/04/2023   Pt will be ind with management and progression of HEP Baseline:  Goal status: INITIAL  2.  Pt will be able to tolerate single leg stability on R LE x 30 sec Baseline:  Goal status: INITIAL  3.  Pt will be able to walk at least 3 miles without exacerbation of pain to >/=1/10 Baseline:  Goal status: INITIAL  4.  Pt will  be able to squat and lift at least 10# to demo increased bilat LE strength Baseline:  Goal status: INITIAL  5.  Pt will have increased LEFS to >/=70/80 to demo MCID Baseline:  Goal status: INITIAL    PLAN:  PT FREQUENCY: 1-2x/week  PT DURATION: 8 weeks  PLANNED INTERVENTIONS: Therapeutic exercises,  Therapeutic activity, Neuromuscular re-education, Balance training, Gait training, Patient/Family education, Self Care, Joint mobilization, Aquatic Therapy, Dry Needling, Electrical stimulation, Cryotherapy, Moist heat, Taping, Vasopneumatic device, Ionotophoresis 4mg /ml Dexamethasone, Manual therapy, and Re-evaluation  PLAN FOR NEXT SESSION: Assess response to HEP. Continue to progress strengthening and eccentrics   Haille Pardi April Ma L Leanthony Rhett, PT 03/14/2023, 10:56 AM

## 2023-03-16 ENCOUNTER — Ambulatory Visit: Payer: Medicare PPO | Admitting: Physical Therapy

## 2023-03-16 NOTE — Therapy (Deleted)
OUTPATIENT PHYSICAL THERAPY LOWER EXTREMITY TREATMENT   Patient Name: Becky Bowers MRN: [id number] DOB:1955-10-20, 67 y.o., female Today's Date: 03/16/2023  END OF SESSION:    Past Medical History:  Diagnosis Date   Arthritis    Concussion 09/12/2019   Past Surgical History:  Procedure Laterality Date   APPENDECTOMY  1968   DILATION AND CURETTAGE OF UTERUS  1990   TONSILLECTOMY  1980   Patient Active Problem List   Diagnosis Date Noted   Tear of right hamstring 02/03/2023   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   Elevated LDL cholesterol level 04/14/2021   Polyarthralgia 03/17/2021   Primary osteoarthritis right first [facility] and right second MCP 04/24/2018   Numbness of right third and fourth toes 03/19/2018   Primary osteoarthritis of both hips 12/08/2015   Headache 08/12/2010   Lumbar degenerative disc disease 01/07/2008    PCP: Agapito Games, MD  REFERRING PROVIDER: Monica Becton, MD  REFERRING DIAG: S76.311A (ICD-10-CM) - Tear of right hamstring  THERAPY DIAG:  No diagnosis found.  Rationale for Evaluation and Treatment: Rehabilitation  ONSET DATE: Around May 16  SUBJECTIVE:   SUBJECTIVE STATEMENT: [place]. Did do exercises. Exercises were okay. Continues to have random moments of tingling in R LE. Pt unsure what causes this as it is variable and does go away.  PERTINENT HISTORY: No history of injuries From eval: Pt states she had an inner ear issue and lost her balance. Pt was standing putting together a rocking chair and slipped/jerked her body and felt something pop in her R posterior hip. Inner ear stuff got cleared up but had continued R posterior hip pain. Pt was walking 3.5 miles/day (has not been walking). Does pilates normally and stopped for now. Pain in general has been decreasing.   PAIN:  Are you having pain? Yes: NPRS scale: 1 currently, at worst  3/10 Pain location: R groin area Pain description: dull ache Aggravating factors: Prolonged sitting, mornings getting out of bed  Relieving factors: Ice  PRECAUTIONS:  From Dr. Melvia Heaps referral notes: Patient is a right biceps femoris tear, minimal retraction, ischiogluteal bursitis, needs to work on passive range of motion for a week, active range of motion for 1 to 2 weeks, and then ECCENTRIC strengthening. Please minimize stretching at least for the first couple of weeks.  WEIGHT BEARING RESTRICTIONS: No  FALLS:  Has patient fallen in last 6 months? No  LIVING ENVIRONMENT: Lives with: lives with their spouse Lives in: House/apartment Stairs: Yes: External: 2 steps; none Has following equipment at home: None  OCCUPATION: Retired  PLOF: Independent  PATIENT GOALS: Return to all normal functions  NEXT MD VISIT: August  OBJECTIVE:   DIAGNOSTIC FINDINGS: MRI 01/21/23 IMPRESSION: 1. Right hamstring tendinosis with a full-thickness minimally retracted tear of the biceps femoris tendon origin with peritendinous edema. 2. Moderate volume fluid within the right ischiogluteal bursa. 3. Mild tendinosis of the contralateral left hamstring origin. 4. Mild tendinosis of the bilateral gluteus medius and minimus tendons without tear. 5. Mild osteoarthritis of the right hip.  PATIENT SURVEYS:  LEFS 60/80 = 75%  EDEMA:  Mild in R buttock  MUSCLE LENGTH: Hamstrings: deferred per precautions Thomas test: WNL  POSTURE:  bilat knees slightly bent  PALPATION: R glute, piriformis tenderness  LOWER EXTREMITY ROM:  Active ROM Right eval Left eval Right 03/09/23 Left 03/09/23  Hip flexion  Hip extension      Hip abduction      Hip adduction      Hip internal rotation      Hip external rotation      Knee flexion 120 135 125 125  Knee extension 0 0 0 0  Ankle dorsiflexion      Ankle plantarflexion      Ankle inversion      Ankle eversion       (Blank rows = not  tested)  LOWER EXTREMITY MMT:  MMT Right eval Left eval  Hip flexion 5 5  Hip extension 3- 4  Hip abduction 4- 4  Hip adduction    Hip internal rotation deferred 4  Hip external rotation deferred 3+  Knee flexion deferred 5  Knee extension 5 5  Ankle dorsiflexion    Ankle plantarflexion    Ankle inversion    Ankle eversion     (Blank rows = not tested)  LOWER EXTREMITY SPECIAL TESTS:  Hip special tests: Luisa Hart (FABER) test: positive , Thomas test: negative, and Ely's test: negative  FUNCTIONAL TESTS:  Deferred  GAIT: Mildly antalgic on R with knee remaining slightly flexed   TODAY'S TREATMENT:    [facility] Adult PT Treatment:                                                DATE: 03/14/23 Therapeutic Exercise: Elliptical 2 min fwd, 2 min bwd Standing hamstring stretch x 30 sec Standing quad stretch x 30 sec Standing adductor stretch x 30 sec Standing hip flexor stretch x 30 sec Standing 3 way hip slide with foot on wash cloth yellow TB 2x10 Standing hamstring eccentrics yellow TB 2x10 Supine figure 4 stretch 2x30 sec Supine piriformis stretch 2x30 sec Sidelying femoral nerve glide x10 Manual Therapy: STM & TPR glute med, max, lateral hamstring   [facility] Adult PT Treatment:                                                DATE: 03/09/23 Therapeutic Exercise: Recumbent bike L3 x 5 min Supine hamstring stretch with strap x30 sec Supine adductor stretch with strap x 30 sec Supine ITB stretch with strap x30 sec Sidelying femoral nerve stretch x 10 Half kneeling hip flexor stretch x1 min  Half kneeling rock forward/backward x10 Standing 3 way hip 2x10 Leg press eccentrics double leg 2x10 40# Sidelying clamshell blue TB 2x10 Sidelying reverse clamshell blue TB 2x10   [facility] Adult PT Treatment:                                                DATE: 03/07/23 Therapeutic Exercise: Recumbent bike L3 x 5 min Supine Pball knee flexion with bridge eccentrics 2x10 Pball knee  flexion/ext maintaining bridge x5 Prone Eccentric hamstring curl blue TB 2x10 Reverse clam blue TB 2x10 Sidelying Clamshell blue TB 3x10 Femoral nerve glide x10 Standing (no hamstring wrap) 3 way hip on slider 2x10    PATIENT EDUCATION:  Education details: Exam findings, POC, initial HEP Person educated: Patient Education method: Explanation, Demonstration, and Handouts Education  comprehension: verbalized understanding, returned demonstration, and needs further education  HOME EXERCISE PROGRAM: Access Code: [email or web address] URL: [email or web address] Date: 03/07/2023 Prepared by: Vernon Prey April Kirstie Peri  Exercises - Prone Hamstring Curl with Anchored Resistance  - 1 x daily - 7 x weekly - 2 sets - 10 reps - Modified Thomas Stretch  - 1 x daily - 7 x weekly - 2 sets - 30 sec hold - X Band Walk  - 1 x daily - 7 x weekly - 2 sets - 10 reps - Supine Bridge with Resistance Band  - 1 x daily - 7 x weekly - 2 sets - 10 reps - Bridge with [employer] on [employer]  - 1 x daily - 7 x weekly - 2 sets - 10 reps - Wall Squat with Resistance Loop  - 1 x daily - 7 x weekly - 1 sets - 10 reps - 3 sec hold - Sidelying Femoral Nerve Glide - Top Leg  - 1 x daily - 7 x weekly - 1 sets - 10 reps - Step Backward with Slider  - 1 x daily - 7 x weekly - 2 sets - 10 reps - Step Sideways with Slider  - 1 x daily - 7 x weekly - 2 sets - 10 reps - Step Forward with Furniture Slider  - 1 x daily - 7 x weekly - 2 sets - 10 reps  ASSESSMENT:  CLINICAL IMPRESSION: Treatment focused on progressing pt to more standing exercises and stabilizing exercises. Still getting intermittent N/T in front of thigh to calf. Increased glute tightness addressed with gentle stretching and manual work.   From eval: Patient is a 67 y.o. F who was seen today for physical therapy evaluation and treatment for R hamstring tear and glute/hamstring tendinosis. Assessment significant for weak R hip and decreased  stability. Per Dr. Melvia Heaps referral, PT to start with PROM x1 week and then AROM for the next 1-2 weeks before initiating eccentric strengthening. Pt will benefit from PT to recover from her injury and return to PLOF. Initiated gentle PROM/AAROM this session for hip mobility with good pt tolerance.   OBJECTIVE IMPAIRMENTS: decreased activity tolerance, decreased balance, decreased endurance, decreased mobility, difficulty walking, decreased ROM, decreased strength, increased edema, increased fascial restrictions, impaired flexibility, improper body mechanics, and pain.    GOALS: Goals reviewed with patient? Yes  SHORT TERM GOALS: Target date: 03/07/2023  Pt will be ind with initial HEP Baseline: Goal status: MET  2.  Pt will demo L = R knee AROM to demo improving hamstring mobility Baseline:  Goal status: MET   LONG TERM GOALS: Target date: 04/04/2023   Pt will be ind with management and progression of HEP Baseline:  Goal status: INITIAL  2.  Pt will be able to tolerate single leg stability on R LE x 30 sec Baseline:  Goal status: INITIAL  3.  Pt will be able to walk at least 3 miles without exacerbation of pain to >/=1/10 Baseline:  Goal status: INITIAL  4.  Pt will be able to squat and lift at least 10# to demo increased bilat LE strength Baseline:  Goal status: INITIAL  5.  Pt will have increased LEFS to >/=70/80 to demo MCID Baseline:  Goal status: INITIAL    PLAN:  PT FREQUENCY: 1-2x/week  PT DURATION: 8 weeks  PLANNED INTERVENTIONS: Therapeutic exercises, Therapeutic activity, Neuromuscular re-education, Balance training, Gait training, Patient/Family education, Self Care, Joint mobilization, Aquatic Therapy, Dry Needling, Electrical stimulation, Cryotherapy, Moist  heat, Taping, Vasopneumatic device, Ionotophoresis 4mg /ml Dexamethasone, Manual therapy, and Re-evaluation  PLAN FOR NEXT SESSION: Assess response to HEP. Continue to progress strengthening and  eccentrics    April Ma L , PT 03/16/2023, 9:35 AM

## 2023-03-17 ENCOUNTER — Ambulatory Visit: Payer: Medicare PPO | Admitting: Sports Medicine

## 2023-03-17 ENCOUNTER — Ambulatory Visit: Payer: Medicare PPO | Admitting: Family Medicine

## 2023-03-17 DIAGNOSIS — S76311A Strain of muscle, fascia and tendon of the posterior muscle group at thigh level, right thigh, initial encounter: Secondary | ICD-10-CM

## 2023-03-17 NOTE — Assessment & Plan Note (Signed)
This is a very pleasant 68 year old female, I saw her about 6 weeks ago, she had initially taken a misstep and felt a pop right buttock, had some pain, was treated conservatively by her PCP for 3 weeks and then an MRI was obtained, the MRI did show some biceps femoris tearing as well as ischiogluteal bursitis. She was referred to me, we continued conservative treatment in the form of thigh compression sleeve, formal physical therapy. She is improving and continues to improve, so for this reason we will hold off on interventional treatment today. She does have a trip coming up to [place] in about 4 weeks, I will see her back 2 weeks from her trip, and if still having discomfort we will do an ultrasound-guided ischial bursa injection.

## 2023-03-17 NOTE — Progress Notes (Signed)
    Procedures performed today:    None.  Independent interpretation of notes and tests performed by another provider:   None.  Brief History, Exam, Impression, and Recommendations:    Tear of right hamstring This is a very pleasant 67 year old female, I saw her about 6 weeks ago, she had initially taken a misstep and felt a pop right buttock, had some pain, was treated conservatively by her PCP for 3 weeks and then an MRI was obtained, the MRI did show some biceps femoris tearing as well as ischiogluteal bursitis. She was referred to me, we continued conservative treatment in the form of thigh compression sleeve, formal physical therapy. She is improving and continues to improve, so for this reason we will hold off on interventional treatment today. She does have a trip coming up to [place] in about 4 weeks, I will see her back 2 weeks from her trip, and if still having discomfort we will do an ultrasound-guided ischial bursa injection.    ____________________________________________ Ihor Austin. Benjamin Stain, M.D., ABFM., CAQSM., AME. Primary Care and Sports Medicine [facility]  Adjunct Professor of Family Medicine  [place] [employer] [facility] [employer]  Restaurant manager, fast food

## 2023-03-21 ENCOUNTER — Ambulatory Visit: Payer: Medicare PPO | Admitting: Physical Therapy

## 2023-03-21 DIAGNOSIS — M25651 Stiffness of right hip, not elsewhere classified: Secondary | ICD-10-CM | POA: Diagnosis not present

## 2023-03-21 DIAGNOSIS — R2689 Other abnormalities of gait and mobility: Secondary | ICD-10-CM | POA: Diagnosis not present

## 2023-03-21 DIAGNOSIS — M25551 Pain in right hip: Secondary | ICD-10-CM

## 2023-03-21 DIAGNOSIS — M6281 Muscle weakness (generalized): Secondary | ICD-10-CM | POA: Diagnosis not present

## 2023-03-21 NOTE — Therapy (Signed)
OUTPATIENT PHYSICAL THERAPY LOWER EXTREMITY TREATMENT   Patient Name: Becky Bowers MRN: [id number] DOB:Jan 04, 1956, 67 y.o., female Today's Date: 03/21/2023  END OF SESSION:  PT End of Session - 03/21/23 1103     Visit Number 11    Number of Visits 16    Date for PT Re-Evaluation 04/04/23    Authorization Type [employer]    PT Start Time 1100    PT Stop Time 1145    PT Time Calculation (min) 45 min    Activity Tolerance Patient tolerated treatment well              Past Medical History:  Diagnosis Date   Arthritis    Concussion 09/12/2019   Past Surgical History:  Procedure Laterality Date   APPENDECTOMY  1968   DILATION AND CURETTAGE OF UTERUS  1990   TONSILLECTOMY  1980   Patient Active Problem List   Diagnosis Date Noted   Tear of right hamstring 02/03/2023   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   Elevated LDL cholesterol level 04/14/2021   Polyarthralgia 03/17/2021   Primary osteoarthritis right first [facility] and right second MCP 04/24/2018   Numbness of right third and fourth toes 03/19/2018   Primary osteoarthritis of both hips 12/08/2015   Headache 08/12/2010   Lumbar degenerative disc disease 01/07/2008    PCP: Agapito Games, MD  REFERRING PROVIDER: Monica Becton, MD  REFERRING DIAG: S76.311A (ICD-10-CM) - Tear of right hamstring  THERAPY DIAG:  No diagnosis found.  Rationale for Evaluation and Treatment: Rehabilitation  ONSET DATE: Around May 16  SUBJECTIVE:   SUBJECTIVE STATEMENT: Pt states she saw Dr. Karie Schwalbe. She reports she is improving but still swelling. She can do things she couldn't do a month ago. Will see him again in 2 weeks. Discussed a possible shot. She leaves for her trip Sept 6.   PERTINENT HISTORY: No history of injuries From eval: Pt states she had an inner ear issue and lost her balance. Pt was standing putting together a rocking chair and slipped/jerked her body and felt something pop in  her R posterior hip. Inner ear stuff got cleared up but had continued R posterior hip pain. Pt was walking 3.5 miles/day (has not been walking). Does pilates normally and stopped for now. Pain in general has been decreasing.   PAIN:  Are you having pain? Yes: NPRS scale: 1 currently, at worst 3/10 Pain location: R groin area Pain description: dull ache Aggravating factors: Prolonged sitting, mornings getting out of bed  Relieving factors: Ice  PRECAUTIONS:  From Dr. Melvia Heaps referral notes: Patient is a right biceps femoris tear, minimal retraction, ischiogluteal bursitis, needs to work on passive range of motion for a week, active range of motion for 1 to 2 weeks, and then ECCENTRIC strengthening. Please minimize stretching at least for the first couple of weeks.  WEIGHT BEARING RESTRICTIONS: No  FALLS:  Has patient fallen in last 6 months? No  LIVING ENVIRONMENT: Lives with: lives with their spouse Lives in: House/apartment Stairs: Yes: External: 2 steps; none Has following equipment at home: None  OCCUPATION: Retired  PLOF: Independent  PATIENT GOALS: Return to all normal functions  NEXT MD VISIT: August  OBJECTIVE:   DIAGNOSTIC FINDINGS: MRI 01/21/23 IMPRESSION: 1. Right hamstring tendinosis with a full-thickness minimally retracted tear of the biceps femoris tendon origin with peritendinous edema. 2. Moderate volume fluid within the right ischiogluteal bursa. 3. Mild tendinosis of the contralateral left hamstring origin. 4. Mild tendinosis  of the bilateral gluteus medius and minimus tendons without tear. 5. Mild osteoarthritis of the right hip.  PATIENT SURVEYS:  LEFS 60/80 = 75%  EDEMA:  Mild in R buttock  MUSCLE LENGTH: Hamstrings: deferred per precautions Thomas test: WNL  POSTURE:  bilat knees slightly bent  PALPATION: R glute, piriformis tenderness  LOWER EXTREMITY ROM:  Active ROM Right eval Left eval Right 03/09/23 Left 03/09/23  Hip flexion       Hip extension      Hip abduction      Hip adduction      Hip internal rotation      Hip external rotation      Knee flexion 120 135 125 125  Knee extension 0 0 0 0  Ankle dorsiflexion      Ankle plantarflexion      Ankle inversion      Ankle eversion       (Blank rows = not tested)  LOWER EXTREMITY MMT:  MMT Right eval Left eval  Hip flexion 5 5  Hip extension 3- 4  Hip abduction 4- 4  Hip adduction    Hip internal rotation deferred 4  Hip external rotation deferred 3+  Knee flexion deferred 5  Knee extension 5 5  Ankle dorsiflexion    Ankle plantarflexion    Ankle inversion    Ankle eversion     (Blank rows = not tested)  LOWER EXTREMITY SPECIAL TESTS:  Hip special tests: Luisa Hart (FABER) test: positive , Thomas test: negative, and Ely's test: negative  FUNCTIONAL TESTS:  Deferred  GAIT: Mildly antalgic on R with knee remaining slightly flexed   TODAY'S TREATMENT:    [facility] Adult PT Treatment:                                                DATE: 03/21/23 Therapeutic Exercise: Treadmill 2.5 mph x 8 min Standing Hamstring stretch 2x 30 sec Adductor stretch x 30 sec Hip flexor stretch x 30 sec 3 way hip red TB 2x10 Runner's step up with HHA 2x10 6" step  Forward step down 6" step 2x10 Manual Therapy: STM & TPR R TFL and quad Skilled assessment and palpation for TPDN Trigger Point Dry-Needling  Treatment instructions: Expect mild to moderate muscle soreness. S/S of pneumothorax if dry needled over a lung field, and to seek immediate medical attention should they occur. Patient verbalized understanding of these instructions and education.  Patient Consent Given: Yes Education handout provided: Yes Muscles treated: R TFL, quad Electrical stimulation performed: No Parameters: N/A Treatment response/outcome: Twitch response illicited, decreased muscle tension   [facility] Adult PT Treatment:                                                DATE: 03/14/23 Therapeutic  Exercise: Elliptical 2 min fwd, 2 min bwd Standing hamstring stretch x 30 sec Standing quad stretch x 30 sec Standing adductor stretch x 30 sec Standing hip flexor stretch x 30 sec Standing 3 way hip slide with foot on wash cloth yellow TB 2x10 Standing hamstring eccentrics yellow TB 2x10 Supine figure 4 stretch 2x30 sec Supine piriformis stretch 2x30 sec Sidelying femoral nerve glide x10 Manual Therapy: STM & TPR glute med,  max, lateral hamstring   [facility] Adult PT Treatment:                                                DATE: 03/09/23 Therapeutic Exercise: Recumbent bike L3 x 5 min Supine hamstring stretch with strap x30 sec Supine adductor stretch with strap x 30 sec Supine ITB stretch with strap x30 sec Sidelying femoral nerve stretch x 10 Half kneeling hip flexor stretch x1 min  Half kneeling rock forward/backward x10 Standing 3 way hip 2x10 Leg press eccentrics double leg 2x10 40# Sidelying clamshell blue TB 2x10 Sidelying reverse clamshell blue TB 2x10   [facility] Adult PT Treatment:                                                DATE: 03/07/23 Therapeutic Exercise: Recumbent bike L3 x 5 min Supine Pball knee flexion with bridge eccentrics 2x10 Pball knee flexion/ext maintaining bridge x5 Prone Eccentric hamstring curl blue TB 2x10 Reverse clam blue TB 2x10 Sidelying Clamshell blue TB 3x10 Femoral nerve glide x10 Standing (no hamstring wrap) 3 way hip on slider 2x10    PATIENT EDUCATION:  Education details: Exam findings, POC, initial HEP Person educated: Patient Education method: Explanation, Demonstration, and Handouts Education comprehension: verbalized understanding, returned demonstration, and needs further education  HOME EXERCISE PROGRAM: Access Code: [email or web address] URL: [email or web address] Date: 03/07/2023 Prepared by: Vernon Prey April Kirstie Peri  Exercises - Prone Hamstring Curl with Anchored Resistance  - 1 x daily - 7 x weekly - 2 sets - 10  reps - Modified Thomas Stretch  - 1 x daily - 7 x weekly - 2 sets - 30 sec hold - X Band Walk  - 1 x daily - 7 x weekly - 2 sets - 10 reps - Supine Bridge with Resistance Band  - 1 x daily - 7 x weekly - 2 sets - 10 reps - Bridge with [employer] on [employer]  - 1 x daily - 7 x weekly - 2 sets - 10 reps - Wall Squat with Resistance Loop  - 1 x daily - 7 x weekly - 1 sets - 10 reps - 3 sec hold - Sidelying Femoral Nerve Glide - Top Leg  - 1 x daily - 7 x weekly - 1 sets - 10 reps - Step Backward with Slider  - 1 x daily - 7 x weekly - 2 sets - 10 reps - Step Sideways with Slider  - 1 x daily - 7 x weekly - 2 sets - 10 reps - Step Forward with Furniture Slider  - 1 x daily - 7 x weekly - 2 sets - 10 reps  ASSESSMENT:  CLINICAL IMPRESSION: Performed TPDN to try and address pt's continued R hip flexor tenderness. Working on improving endurance with walking and weightbearing through R LE. Will work on providing pt exercises she can perform in a train/while traveling. Working on improving stepping up/down for ascending/descending changes in elevation.   From eval: Patient is a 67 y.o. F who was seen today for physical therapy evaluation and treatment for R hamstring tear and glute/hamstring tendinosis. Assessment significant for weak R hip and decreased stability. Per Dr. Melvia Heaps referral, PT to start with PROM  x1 week and then AROM for the next 1-2 weeks before initiating eccentric strengthening. Pt will benefit from PT to recover from her injury and return to PLOF. Initiated gentle PROM/AAROM this session for hip mobility with good pt tolerance.   OBJECTIVE IMPAIRMENTS: decreased activity tolerance, decreased balance, decreased endurance, decreased mobility, difficulty walking, decreased ROM, decreased strength, increased edema, increased fascial restrictions, impaired flexibility, improper body mechanics, and pain.    GOALS: Goals reviewed with patient? Yes  SHORT TERM GOALS: Target date:  03/07/2023  Pt will be ind with initial HEP Baseline: Goal status: MET  2.  Pt will demo L = R knee AROM to demo improving hamstring mobility Baseline:  Goal status: MET   LONG TERM GOALS: Target date: 04/04/2023   Pt will be ind with management and progression of HEP Baseline:  Goal status: INITIAL  2.  Pt will be able to tolerate single leg stability on R LE x 30 sec Baseline:  Goal status: INITIAL  3.  Pt will be able to walk at least 3 miles without exacerbation of pain to >/=1/10 Baseline:  Goal status: INITIAL  4.  Pt will be able to squat and lift at least 10# to demo increased bilat LE strength Baseline:  Goal status: INITIAL  5.  Pt will have increased LEFS to >/=70/80 to demo MCID Baseline:  Goal status: INITIAL    PLAN:  PT FREQUENCY: 1-2x/week  PT DURATION: 8 weeks  PLANNED INTERVENTIONS: Therapeutic exercises, Therapeutic activity, Neuromuscular re-education, Balance training, Gait training, Patient/Family education, Self Care, Joint mobilization, Aquatic Therapy, Dry Needling, Electrical stimulation, Cryotherapy, Moist heat, Taping, Vasopneumatic device, Ionotophoresis 4mg /ml Dexamethasone, Manual therapy, and Re-evaluation  PLAN FOR NEXT SESSION: Assess response to HEP. Continue to progress strengthening and eccentrics   Marsh Heckler April Ma L Quinnie Barcelo, PT 03/21/2023, 11:04 AM

## 2023-03-23 ENCOUNTER — Encounter: Payer: Self-pay | Admitting: Physical Therapy

## 2023-03-23 ENCOUNTER — Ambulatory Visit: Payer: Medicare PPO | Admitting: Physical Therapy

## 2023-03-23 DIAGNOSIS — M25651 Stiffness of right hip, not elsewhere classified: Secondary | ICD-10-CM

## 2023-03-23 DIAGNOSIS — M25551 Pain in right hip: Secondary | ICD-10-CM

## 2023-03-23 DIAGNOSIS — R2689 Other abnormalities of gait and mobility: Secondary | ICD-10-CM

## 2023-03-23 DIAGNOSIS — M6281 Muscle weakness (generalized): Secondary | ICD-10-CM | POA: Diagnosis not present

## 2023-03-23 NOTE — Therapy (Signed)
OUTPATIENT PHYSICAL THERAPY LOWER EXTREMITY TREATMENT   Patient Name: Becky Bowers MRN: [id number] DOB:[birth year], 67 y.o., female Today's Date: 03/23/2023  END OF SESSION:  PT End of Session - 03/23/23 1054     Visit Number 12    Number of Visits 16    Date for PT Re-Evaluation 04/04/23    Authorization Type [employer]    PT Start Time 1100    PT Stop Time 1145    PT Time Calculation (min) 45 min    Activity Tolerance Patient tolerated treatment well              Past Medical History:  Diagnosis Date   Arthritis    Concussion 09/12/2019   Past Surgical History:  Procedure Laterality Date   APPENDECTOMY  1968   DILATION AND CURETTAGE OF UTERUS  1990   TONSILLECTOMY  1980   Patient Active Problem List   Diagnosis Date Noted   Tear of right hamstring 02/03/2023   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   Elevated LDL cholesterol level 04/14/2021   Polyarthralgia 03/17/2021   Primary osteoarthritis right first [facility] and right second MCP 04/24/2018   Numbness of right third and fourth toes 03/19/2018   Primary osteoarthritis of both hips 12/08/2015   Headache 08/12/2010   Lumbar degenerative disc disease 01/07/2008    PCP: Agapito Games, MD  REFERRING PROVIDER: Monica Becton, MD  REFERRING DIAG: [phone] (ICD-10-CM) - Tear of right hamstring  THERAPY DIAG:  Pain in right hip  Stiffness of right hip, not elsewhere classified  Muscle weakness (generalized)  Other abnormalities of gait and mobility  Rationale for Evaluation and Treatment: Rehabilitation  ONSET DATE: Around May 16  SUBJECTIVE:   SUBJECTIVE STATEMENT: Pt states she was very sore in her quads. Hip flexor wasn't too sore after needling. Did walk further yesterday.   PERTINENT HISTORY: No history of injuries From eval: Pt states she had an inner ear issue and lost her balance. Pt was standing putting together a rocking chair and slipped/jerked her  body and felt something pop in her R posterior hip. Inner ear stuff got cleared up but had continued R posterior hip pain. Pt was walking 3.5 miles/day (has not been walking). Does pilates normally and stopped for now. Pain in general has been decreasing.   PAIN:  Are you having pain? Yes: NPRS scale: 1 currently, at worst 3/10 Pain location: R groin area Pain description: dull ache Aggravating factors: Prolonged sitting, mornings getting out of bed  Relieving factors: Ice  PRECAUTIONS:  From Dr. Melvia Heaps referral notes: Patient is a right biceps femoris tear, minimal retraction, ischiogluteal bursitis, needs to work on passive range of motion for a week, active range of motion for 1 to 2 weeks, and then ECCENTRIC strengthening. Please minimize stretching at least for the first couple of weeks.  WEIGHT BEARING RESTRICTIONS: No  FALLS:  Has patient fallen in last 6 months? No  LIVING ENVIRONMENT: Lives with: lives with their spouse Lives in: House/apartment Stairs: Yes: External: 2 steps; none Has following equipment at home: None  OCCUPATION: Retired  PLOF: Independent  PATIENT GOALS: Return to all normal functions  NEXT MD VISIT: August  OBJECTIVE:   DIAGNOSTIC FINDINGS: MRI 01/21/23 IMPRESSION: 1. Right hamstring tendinosis with a full-thickness minimally retracted tear of the biceps femoris tendon origin with peritendinous edema. 2. Moderate volume fluid within the right ischiogluteal bursa. 3. Mild tendinosis of the contralateral left hamstring origin. 4. Mild tendinosis of the  bilateral gluteus medius and minimus tendons without tear. 5. Mild osteoarthritis of the right hip.  PATIENT SURVEYS:  LEFS 60/80 = 75%  EDEMA:  Mild in R buttock  MUSCLE LENGTH: Hamstrings: deferred per precautions Thomas test: WNL  POSTURE:  bilat knees slightly bent  PALPATION: R glute, piriformis tenderness  LOWER EXTREMITY ROM:  Active ROM Right eval Left eval Right 03/09/23  Left 03/09/23  Hip flexion      Hip extension      Hip abduction      Hip adduction      Hip internal rotation      Hip external rotation      Knee flexion 120 135 125 125  Knee extension 0 0 0 0  Ankle dorsiflexion      Ankle plantarflexion      Ankle inversion      Ankle eversion       (Blank rows = not tested)  LOWER EXTREMITY MMT:  MMT Right eval Left eval  Hip flexion 5 5  Hip extension 3- 4  Hip abduction 4- 4  Hip adduction    Hip internal rotation deferred 4  Hip external rotation deferred 3+  Knee flexion deferred 5  Knee extension 5 5  Ankle dorsiflexion    Ankle plantarflexion    Ankle inversion    Ankle eversion     (Blank rows = not tested)  LOWER EXTREMITY SPECIAL TESTS:  Hip special tests: Luisa Hart (FABER) test: positive , Thomas test: negative, and Ely's test: negative  FUNCTIONAL TESTS:  Deferred  GAIT: Mildly antalgic on R with knee remaining slightly flexed   TODAY'S TREATMENT:    [facility] Adult PT Treatment:                                                DATE: 03/23/23 Therapeutic Exercise: Treadmill 2.5 mph x 5 min Standing Hamstring x30 sec Quad stretch 2x30 sec Adductor stretch x30 sec Hip flexor stretch x30 sec Heel raise 2x10 3 way hip red TB standing on airex 2x10 Side step up and over on airex with red TB around knees 2x10 Leg press 90# double leg 2x10 Resisted walking cables 5# x10 Neuromuscular re-ed: On airex: Tandem stance 2x30 sec SLS 2x20 sec   [facility] Adult PT Treatment:                                                DATE: 03/21/23 Therapeutic Exercise: Treadmill 2.5 mph x 8 min Standing Hamstring stretch 2x 30 sec Adductor stretch x 30 sec Hip flexor stretch x 30 sec 3 way hip red TB 2x10 Runner's step up with HHA 2x10 6" step  Forward step down 6" step 2x10 Manual Therapy: STM & TPR R TFL and quad Skilled assessment and palpation for TPDN Trigger Point Dry-Needling  Treatment instructions: Expect mild to moderate  muscle soreness. S/S of pneumothorax if dry needled over a lung field, and to seek immediate medical attention should they occur. Patient verbalized understanding of these instructions and education.  Patient Consent Given: Yes Education handout provided: Yes Muscles treated: R TFL, quad Electrical stimulation performed: No Parameters: N/A Treatment response/outcome: Twitch response illicited, decreased muscle tension   [facility] Adult PT Treatment:  DATE: 03/14/23 Therapeutic Exercise: Elliptical 2 min fwd, 2 min bwd Standing hamstring stretch x 30 sec Standing quad stretch x 30 sec Standing adductor stretch x 30 sec Standing hip flexor stretch x 30 sec Standing 3 way hip slide with foot on wash cloth yellow TB 2x10 Standing hamstring eccentrics yellow TB 2x10 Supine figure 4 stretch 2x30 sec Supine piriformis stretch 2x30 sec Sidelying femoral nerve glide x10 Manual Therapy: STM & TPR glute med, max, lateral hamstring   [facility] Adult PT Treatment:                                                DATE: 03/09/23 Therapeutic Exercise: Recumbent bike L3 x 5 min Supine hamstring stretch with strap x30 sec Supine adductor stretch with strap x 30 sec Supine ITB stretch with strap x30 sec Sidelying femoral nerve stretch x 10 Half kneeling hip flexor stretch x1 min  Half kneeling rock forward/backward x10 Standing 3 way hip 2x10 Leg press eccentrics double leg 2x10 40# Sidelying clamshell blue TB 2x10 Sidelying reverse clamshell blue TB 2x10   [facility] Adult PT Treatment:                                                DATE: 03/07/23 Therapeutic Exercise: Recumbent bike L3 x 5 min Supine Pball knee flexion with bridge eccentrics 2x10 Pball knee flexion/ext maintaining bridge x5 Prone Eccentric hamstring curl blue TB 2x10 Reverse clam blue TB 2x10 Sidelying Clamshell blue TB 3x10 Femoral nerve glide x10 Standing (no hamstring wrap) 3 way hip on  slider 2x10    PATIENT EDUCATION:  Education details: Exam findings, POC, initial HEP Person educated: Patient Education method: Explanation, Demonstration, and Handouts Education comprehension: verbalized understanding, returned demonstration, and needs further education  HOME EXERCISE PROGRAM: Access Code: [email or web address] URL: [email or web address] Date: 03/07/2023 Prepared by: Vernon Prey April Kirstie Peri  Exercises - Prone Hamstring Curl with Anchored Resistance  - 1 x daily - 7 x weekly - 2 sets - 10 reps - Modified Thomas Stretch  - 1 x daily - 7 x weekly - 2 sets - 30 sec hold - X Band Walk  - 1 x daily - 7 x weekly - 2 sets - 10 reps - Supine Bridge with Resistance Band  - 1 x daily - 7 x weekly - 2 sets - 10 reps - Bridge with [employer] on [employer]  - 1 x daily - 7 x weekly - 2 sets - 10 reps - Wall Squat with Resistance Loop  - 1 x daily - 7 x weekly - 1 sets - 10 reps - 3 sec hold - Sidelying Femoral Nerve Glide - Top Leg  - 1 x daily - 7 x weekly - 1 sets - 10 reps - Step Backward with Slider  - 1 x daily - 7 x weekly - 2 sets - 10 reps - Step Sideways with Slider  - 1 x daily - 7 x weekly - 2 sets - 10 reps - Step Forward with Furniture Slider  - 1 x daily - 7 x weekly - 2 sets - 10 reps  ASSESSMENT:  CLINICAL IMPRESSION: Pt notes less tenderness in hip flexors after needling but still gets N/T  down R LE intermittently. Session initiated more exercises on foam to incorporate balance and stability.   From eval: Patient is a 67 y.o. F who was seen today for physical therapy evaluation and treatment for R hamstring tear and glute/hamstring tendinosis. Assessment significant for weak R hip and decreased stability. Per Dr. Melvia Heaps referral, PT to start with PROM x1 week and then AROM for the next 1-2 weeks before initiating eccentric strengthening. Pt will benefit from PT to recover from her injury and return to PLOF. Initiated gentle PROM/AAROM this session for hip  mobility with good pt tolerance.   OBJECTIVE IMPAIRMENTS: decreased activity tolerance, decreased balance, decreased endurance, decreased mobility, difficulty walking, decreased ROM, decreased strength, increased edema, increased fascial restrictions, impaired flexibility, improper body mechanics, and pain.    GOALS: Goals reviewed with patient? Yes  SHORT TERM GOALS: Target date: 03/07/2023  Pt will be ind with initial HEP Baseline: Goal status: MET  2.  Pt will demo L = R knee AROM to demo improving hamstring mobility Baseline:  Goal status: MET   LONG TERM GOALS: Target date: 04/04/2023   Pt will be ind with management and progression of HEP Baseline:  Goal status: INITIAL  2.  Pt will be able to tolerate single leg stability on R LE x 30 sec Baseline:  Goal status: INITIAL  3.  Pt will be able to walk at least 3 miles without exacerbation of pain to >/=1/10 Baseline:  Goal status: INITIAL  4.  Pt will be able to squat and lift at least 10# to demo increased bilat LE strength Baseline:  Goal status: INITIAL  5.  Pt will have increased LEFS to >/=70/80 to demo MCID Baseline:  Goal status: INITIAL    PLAN:  PT FREQUENCY: 1-2x/week  PT DURATION: 8 weeks  PLANNED INTERVENTIONS: Therapeutic exercises, Therapeutic activity, Neuromuscular re-education, Balance training, Gait training, Patient/Family education, Self Care, Joint mobilization, Aquatic Therapy, Dry Needling, Electrical stimulation, Cryotherapy, Moist heat, Taping, Vasopneumatic device, Ionotophoresis 4mg /ml Dexamethasone, Manual therapy, and Re-evaluation  PLAN FOR NEXT SESSION: Assess response to HEP. Continue to progress strengthening and eccentrics   Lizandro Spellman April Ma L Aaronmichael Brumbaugh, PT 03/23/2023, 10:54 AM

## 2023-03-28 ENCOUNTER — Ambulatory Visit: Payer: Medicare PPO

## 2023-03-28 DIAGNOSIS — M25651 Stiffness of right hip, not elsewhere classified: Secondary | ICD-10-CM | POA: Diagnosis not present

## 2023-03-28 DIAGNOSIS — M25551 Pain in right hip: Secondary | ICD-10-CM | POA: Diagnosis not present

## 2023-03-28 DIAGNOSIS — R2689 Other abnormalities of gait and mobility: Secondary | ICD-10-CM

## 2023-03-28 DIAGNOSIS — M6281 Muscle weakness (generalized): Secondary | ICD-10-CM

## 2023-03-28 NOTE — Therapy (Signed)
OUTPATIENT PHYSICAL THERAPY LOWER EXTREMITY TREATMENT   Patient Name: Becky Bowers MRN: [id number] DOB:[birth year], 67 y.o., female Today's Date: 03/28/2023  END OF SESSION:  PT End of Session - 03/28/23 1019     Visit Number 13    Number of Visits 16    Date for PT Re-Evaluation 04/04/23    Authorization Type [employer]    Authorization Time Period 16 VISITS APPROVED FOR PT 02/07/2023-04/04/2023    Authorization - Visit Number 13    Authorization - Number of Visits 16    PT Start Time 1020    PT Stop Time 1105    PT Time Calculation (min) 45 min    Activity Tolerance Patient tolerated treatment well    Behavior During Therapy [facility] for tasks assessed/performed              Past Medical History:  Diagnosis Date   Arthritis    Concussion 09/12/2019   Past Surgical History:  Procedure Laterality Date   APPENDECTOMY  1968   DILATION AND CURETTAGE OF UTERUS  1990   TONSILLECTOMY  1980   Patient Active Problem List   Diagnosis Date Noted   Tear of right hamstring 02/03/2023   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   Elevated LDL cholesterol level 04/14/2021   Polyarthralgia 03/17/2021   Primary osteoarthritis right first [facility] and right second MCP 04/24/2018   Numbness of right third and fourth toes 03/19/2018   Primary osteoarthritis of both hips 12/08/2015   Headache 08/12/2010   Lumbar degenerative disc disease 01/07/2008    PCP: Agapito Games, MD  REFERRING PROVIDER: Monica Becton, MD  REFERRING DIAG: [phone] (ICD-10-CM) - Tear of right hamstring  THERAPY DIAG:  Pain in right hip  Stiffness of right hip, not elsewhere classified  Muscle weakness (generalized)  Other abnormalities of gait and mobility  Rationale for Evaluation and Treatment: Rehabilitation  ONSET DATE: Around May 16  SUBJECTIVE:   SUBJECTIVE STATEMENT: Patient reports she was moving and cleaning over the weekend and irritated her hamstring,  states she had some increase in swelling. Patient states she has an upcoming train trip across [place] and is feeling apprehensive on how she will do with hamstring after the cleaning trip last weekend.    PERTINENT HISTORY: No history of injuries From eval: Pt states she had an inner ear issue and lost her balance. Pt was standing putting together a rocking chair and slipped/jerked her body and felt something pop in her R posterior hip. Inner ear stuff got cleared up but had continued R posterior hip pain. Pt was walking 3.5 miles/day (has not been walking). Does pilates normally and stopped for now. Pain in general has been decreasing.   PAIN:  Are you having pain? Yes: NPRS scale: 1 currently, at worst 3/10 Pain location: R groin area Pain description: dull ache Aggravating factors: Prolonged sitting, mornings getting out of bed  Relieving factors: Ice  PRECAUTIONS:  From Dr. Melvia Heaps referral notes: Patient is a right biceps femoris tear, minimal retraction, ischiogluteal bursitis, needs to work on passive range of motion for a week, active range of motion for 1 to 2 weeks, and then ECCENTRIC strengthening. Please minimize stretching at least for the first couple of weeks.  WEIGHT BEARING RESTRICTIONS: No  FALLS:  Has patient fallen in last 6 months? No  LIVING ENVIRONMENT: Lives with: lives with their spouse Lives in: House/apartment Stairs: Yes: External: 2 steps; none Has following equipment at home: None  OCCUPATION:  Retired  PLOF: Independent  PATIENT GOALS: Return to all normal functions  NEXT MD VISIT: August  OBJECTIVE:   DIAGNOSTIC FINDINGS: MRI 01/21/23 IMPRESSION: 1. Right hamstring tendinosis with a full-thickness minimally retracted tear of the biceps femoris tendon origin with peritendinous edema. 2. Moderate volume fluid within the right ischiogluteal bursa. 3. Mild tendinosis of the contralateral left hamstring origin. 4. Mild tendinosis of the bilateral  gluteus medius and minimus tendons without tear. 5. Mild osteoarthritis of the right hip.  PATIENT SURVEYS:  LEFS 60/80 = 75%  EDEMA:  Mild in R buttock  MUSCLE LENGTH: Hamstrings: deferred per precautions Thomas test: WNL  POSTURE:  bilat knees slightly bent  PALPATION: R glute, piriformis tenderness  LOWER EXTREMITY ROM:  Active ROM Right eval Left eval Right 03/09/23 Left 03/09/23  Hip flexion      Hip extension      Hip abduction      Hip adduction      Hip internal rotation      Hip external rotation      Knee flexion 120 135 125 125  Knee extension 0 0 0 0  Ankle dorsiflexion      Ankle plantarflexion      Ankle inversion      Ankle eversion       (Blank rows = not tested)  LOWER EXTREMITY MMT:  MMT Right eval Left eval  Hip flexion 5 5  Hip extension 3- 4  Hip abduction 4- 4  Hip adduction    Hip internal rotation deferred 4  Hip external rotation deferred 3+  Knee flexion deferred 5  Knee extension 5 5  Ankle dorsiflexion    Ankle plantarflexion    Ankle inversion    Ankle eversion     (Blank rows = not tested)  LOWER EXTREMITY SPECIAL TESTS:  Hip special tests: Luisa Hart (FABER) test: positive , Thomas test: negative, and Ely's test: negative  FUNCTIONAL TESTS:  Deferred  GAIT: Mildly antalgic on R with knee remaining slightly flexed   TODAY'S TREATMENT:    [facility] Adult PT Treatment:                                                DATE: 03/28/2023 Therapeutic Exercise: Treadmill 2.5 mph x 5 min HS stretch on step  HS/glute iso with foot against (B, different cues) S/L bent knee hip abd (RTB) --> hip abd iso with knee extension Supine table top with ball b/w knees: hip IR AROM + abdominal bracing Seated R hip IR (ball b/w knees) --> added YTB Standing squats on bosu platform Isometric R single leg heel raise   [facility] Adult PT Treatment:                                                DATE: 03/23/23 Therapeutic Exercise: Treadmill 2.5 mph x  5 min Standing Hamstring x30 sec Quad stretch 2x30 sec Adductor stretch x30 sec Hip flexor stretch x30 sec Heel raise 2x10 3 way hip red TB standing on airex 2x10 Side step up and over on airex with red TB around knees 2x10 Leg press 90# double leg 2x10 Resisted walking cables 5# x10 Neuromuscular re-ed: On airex: Tandem stance 2x30 sec SLS  2x20 sec   [facility] Adult PT Treatment:                                                DATE: 03/21/23 Therapeutic Exercise: Treadmill 2.5 mph x 8 min Standing Hamstring stretch 2x 30 sec Adductor stretch x 30 sec Hip flexor stretch x 30 sec 3 way hip red TB 2x10 Runner's step up with HHA 2x10 6" step  Forward step down 6" step 2x10 Manual Therapy: STM & TPR R TFL and quad Skilled assessment and palpation for TPDN Trigger Point Dry-Needling  Treatment instructions: Expect mild to moderate muscle soreness. S/S of pneumothorax if dry needled over a lung field, and to seek immediate medical attention should they occur. Patient verbalized understanding of these instructions and education.  Patient Consent Given: Yes Education handout provided: Yes Muscles treated: R TFL, quad Electrical stimulation performed: No Parameters: N/A Treatment response/outcome: Twitch response illicited, decreased muscle tension   PATIENT EDUCATION:  Education details: Updated HEP Person educated: Patient Education method: Explanation, Demonstration, and Handouts Education comprehension: verbalized understanding, returned demonstration, and needs further education  HOME EXERCISE PROGRAM: Access Code: [email or web address] URL: [email or web address] Date: 03/28/2023 Prepared by: Carlynn Herald  Exercises - Prone Hamstring Curl with Anchored Resistance  - 1 x daily - 7 x weekly - 2 sets - 10 reps - Modified Thomas Stretch  - 1 x daily - 7 x weekly - 2 sets - 30 sec hold - X Band Walk  - 1 x daily - 7 x weekly - 2 sets - 10 reps - Bridge with [employer]  on [employer]  - 1 x daily - 7 x weekly - 2 sets - 10 reps - Sidelying Femoral Nerve Glide - Top Leg  - 1 x daily - 7 x weekly - 1 sets - 10 reps - Half Kneeling Hip Flexor Stretch  - 1 x daily - 7 x weekly - 1 sets - 1 min hold - Standing Hamstring Curl with Resistance  - 1 x daily - 7 x weekly - 2 sets - 10 reps - Standing Hip Flexor Stretch  - 1 x daily - 7 x weekly - 2 sets - 30 sec hold - Standing Hamstring Stretch with Step  - 1 x daily - 7 x weekly - 2 sets - 30 sec hold - Side Lunge Adductor Stretch  - 1 x daily - 7 x weekly - 2 sets - 30 sec hold - Runner's Step Up/Down  - 1 x daily - 7 x weekly - 2 sets - 10 reps - Forward Step Down  - 1 x daily - 7 x weekly - 2 sets - 10 reps - Single Leg Isometric Heel Raise at Wall  - 1 x daily - 7 x weekly - 3 sets - 10 reps - Seated Hip Internal Rotation with Ball and Resistance  - 1 x daily - 7 x weekly - 3 sets - 10 reps  ASSESSMENT:  CLINICAL IMPRESSION: Glute isometric exercises continued; cueing needed to improve R heel connection/glute activation during single leg deadlift variation and squats on unstable surface. Patient challenged with R heel raise isometric exercise; tactile cues provided to decrease forward lean compensation.   OBJECTIVE IMPAIRMENTS: decreased activity tolerance, decreased balance, decreased endurance, decreased mobility, difficulty walking, decreased ROM, decreased strength, increased edema, increased fascial restrictions, impaired flexibility, improper  body mechanics, and pain.    GOALS: Goals reviewed with patient? Yes  SHORT TERM GOALS: Target date: 03/07/2023  Pt will be ind with initial HEP Baseline: Goal status: MET  2.  Pt will demo L = R knee AROM to demo improving hamstring mobility Baseline:  Goal status: MET   LONG TERM GOALS: Target date: 04/04/2023  Pt will be ind with management and progression of HEP Baseline:  Goal status: INITIAL  2.  Pt will be able to tolerate single leg stability on  R LE x 30 sec Baseline:  Goal status: INITIAL  3.  Pt will be able to walk at least 3 miles without exacerbation of pain to >/=1/10 Baseline:  Goal status: INITIAL  4.  Pt will be able to squat and lift at least 10# to demo increased bilat LE strength Baseline:  Goal status: INITIAL  5.  Pt will have increased LEFS to >/=70/80 to demo MCID Baseline:  Goal status: INITIAL   PLAN:  PT FREQUENCY: 1-2x/week  PT DURATION: 8 weeks  PLANNED INTERVENTIONS: Therapeutic exercises, Therapeutic activity, Neuromuscular re-education, Balance training, Gait training, Patient/Family education, Self Care, Joint mobilization, Aquatic Therapy, Dry Needling, Electrical stimulation, Cryotherapy, Moist heat, Taping, Vasopneumatic device, Ionotophoresis 4mg /ml Dexamethasone, Manual therapy, and Re-evaluation  PLAN FOR NEXT SESSION: Re-Eval 8/27. Continue to progress strengthening and eccentrics   Sanjuana Mae, PTA 03/28/2023, 12:01 PM

## 2023-03-30 ENCOUNTER — Ambulatory Visit: Payer: Medicare PPO

## 2023-03-30 DIAGNOSIS — M25551 Pain in right hip: Secondary | ICD-10-CM

## 2023-03-30 DIAGNOSIS — M6281 Muscle weakness (generalized): Secondary | ICD-10-CM

## 2023-03-30 DIAGNOSIS — R2689 Other abnormalities of gait and mobility: Secondary | ICD-10-CM | POA: Diagnosis not present

## 2023-03-30 DIAGNOSIS — M25651 Stiffness of right hip, not elsewhere classified: Secondary | ICD-10-CM

## 2023-03-30 NOTE — Therapy (Signed)
OUTPATIENT PHYSICAL THERAPY LOWER EXTREMITY TREATMENT   Patient Name: Becky Bowers MRN: [id number] DOB:1955-12-01, 67 y.o., female Today's Date: 03/30/2023  END OF SESSION:  PT End of Session - 03/30/23 0913     Visit Number 14    Number of Visits 16    Date for PT Re-Evaluation 04/04/23    Authorization Type [employer]    Authorization Time Period 16 VISITS APPROVED FOR PT 02/07/2023-04/04/2023    Authorization - Visit Number 14    Authorization - Number of Visits 16    PT Start Time 0913    PT Stop Time 1000    PT Time Calculation (min) 47 min    Activity Tolerance Patient tolerated treatment well    Behavior During Therapy [facility] for tasks assessed/performed              Past Medical History:  Diagnosis Date   Arthritis    Concussion 09/12/2019   Past Surgical History:  Procedure Laterality Date   APPENDECTOMY  1968   DILATION AND CURETTAGE OF UTERUS  1990   TONSILLECTOMY  1980   Patient Active Problem List   Diagnosis Date Noted   Tear of right hamstring 02/03/2023   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   Elevated LDL cholesterol level 04/14/2021   Polyarthralgia 03/17/2021   Primary osteoarthritis right first [facility] and right second MCP 04/24/2018   Numbness of right third and fourth toes 03/19/2018   Primary osteoarthritis of both hips 12/08/2015   Headache 08/12/2010   Lumbar degenerative disc disease 01/07/2008    PCP: Agapito Games, MD  REFERRING PROVIDER: Monica Becton, MD  REFERRING DIAG: [phone] (ICD-10-CM) - Tear of right hamstring  THERAPY DIAG:  Pain in right hip  Stiffness of right hip, not elsewhere classified  Muscle weakness (generalized)  Other abnormalities of gait and mobility  Rationale for Evaluation and Treatment: Rehabilitation  ONSET DATE: Around May 16  SUBJECTIVE:   SUBJECTIVE STATEMENT: Patient reports she was sore in R glute, which was to be expected from exercises in PT;  states the day after PT she did her HEP and she was very sore in R distal HS with increased swelling. Patient states today she is feeling much better today, states pain is very mild (1/10). Patient states she has been a lot more active this last week.   PERTINENT HISTORY: No history of injuries From eval: Pt states she had an inner ear issue and lost her balance. Pt was standing putting together a rocking chair and slipped/jerked her body and felt something pop in her R posterior hip. Inner ear stuff got cleared up but had continued R posterior hip pain. Pt was walking 3.5 miles/day (has not been walking). Does pilates normally and stopped for now. Pain in general has been decreasing.   PAIN:  Are you having pain? Yes: NPRS scale: 1 currently, at worst 3/10 Pain location: R groin area Pain description: dull ache Aggravating factors: Prolonged sitting, mornings getting out of bed  Relieving factors: Ice  PRECAUTIONS:  From Dr. Melvia Heaps referral notes: Patient is a right biceps femoris tear, minimal retraction, ischiogluteal bursitis, needs to work on passive range of motion for a week, active range of motion for 1 to 2 weeks, and then ECCENTRIC strengthening. Please minimize stretching at least for the first couple of weeks.  WEIGHT BEARING RESTRICTIONS: No  FALLS:  Has patient fallen in last 6 months? No  LIVING ENVIRONMENT: Lives with: lives with their spouse Lives  in: House/apartment Stairs: Yes: External: 2 steps; none Has following equipment at home: None  OCCUPATION: Retired  PLOF: Independent  PATIENT GOALS: Return to all normal functions  NEXT MD VISIT: August  OBJECTIVE:   DIAGNOSTIC FINDINGS: MRI 01/21/23 IMPRESSION: 1. Right hamstring tendinosis with a full-thickness minimally retracted tear of the biceps femoris tendon origin with peritendinous edema. 2. Moderate volume fluid within the right ischiogluteal bursa. 3. Mild tendinosis of the contralateral left hamstring  origin. 4. Mild tendinosis of the bilateral gluteus medius and minimus tendons without tear. 5. Mild osteoarthritis of the right hip.  PATIENT SURVEYS:  LEFS 60/80 = 75%  EDEMA:  Mild in R buttock  MUSCLE LENGTH: Hamstrings: deferred per precautions Thomas test: WNL  POSTURE:  bilat knees slightly bent  PALPATION: R glute, piriformis tenderness  LOWER EXTREMITY ROM:  Active ROM Right eval Left eval Right 03/09/23 Left 03/09/23  Hip flexion      Hip extension      Hip abduction      Hip adduction      Hip internal rotation      Hip external rotation      Knee flexion 120 135 125 125  Knee extension 0 0 0 0  Ankle dorsiflexion      Ankle plantarflexion      Ankle inversion      Ankle eversion       (Blank rows = not tested)  LOWER EXTREMITY MMT:  MMT Right eval Left eval  Hip flexion 5 5  Hip extension 3- 4  Hip abduction 4- 4  Hip adduction    Hip internal rotation deferred 4  Hip external rotation deferred 3+  Knee flexion deferred 5  Knee extension 5 5  Ankle dorsiflexion    Ankle plantarflexion    Ankle inversion    Ankle eversion     (Blank rows = not tested)  LOWER EXTREMITY SPECIAL TESTS:  Hip special tests: Luisa Hart (FABER) test: positive , Thomas test: negative, and Ely's test: negative  FUNCTIONAL TESTS:  Deferred  GAIT: Mildly antalgic on R with knee remaining slightly flexed   TODAY'S TREATMENT:    [facility] Adult PT Treatment:                                                DATE: 03/30/2023 Therapeutic Exercise: Treadmill 2.5 mph, incline 0-10% x 8 min Supine HS set 10x5" Prone HS curl YTB (light resistance) x10 Prone bent knee hip extension x10 Standing deadlift with orange super band x10 Bkwd walking --> bkwd marching Staggered stance deadlift with orange super band x10 Push/pull sled --> added 20# Therapeutic Activity: Airex: Single leg balance (R) 3-way hip RTB crossed at ankles (R SLS) Kickstand R + 10#KB swing on same & opposite  side Sow fwd/bkwd marching with 10#KB suitcase carry (both sides) Self Care: Walking with pack in prep/training for upcoming trip   [facility] Adult PT Treatment:                                                DATE: 03/28/2023 Therapeutic Exercise: Treadmill 2.5 mph x 5 min HS stretch on step  HS/glute iso with foot against (B, different cues) S/L bent knee hip abd (  RTB) --> hip abd iso with knee extension Supine table top with ball b/w knees: hip IR AROM + abdominal bracing Seated R hip IR (ball b/w knees) --> added YTB Standing squats on bosu platform Isometric R single leg heel raise   [facility] Adult PT Treatment:                                                DATE: 03/21/23 Therapeutic Exercise: Treadmill 2.5 mph x 8 min Standing Hamstring stretch 2x 30 sec Adductor stretch x 30 sec Hip flexor stretch x 30 sec 3 way hip red TB 2x10 Runner's step up with HHA 2x10 6" step  Forward step down 6" step 2x10 Manual Therapy: STM & TPR R TFL and quad Skilled assessment and palpation for TPDN Trigger Point Dry-Needling  Treatment instructions: Expect mild to moderate muscle soreness. S/S of pneumothorax if dry needled over a lung field, and to seek immediate medical attention should they occur. Patient verbalized understanding of these instructions and education.  Patient Consent Given: Yes Education handout provided: Yes Muscles treated: R TFL, quad Electrical stimulation performed: No Parameters: N/A Treatment response/outcome: Twitch response illicited, decreased muscle tension   PATIENT EDUCATION:  Education details: Updated HEP Person educated: Patient Education method: Explanation, Demonstration, and Handouts Education comprehension: verbalized understanding, returned demonstration, and needs further education  HOME EXERCISE PROGRAM: Access Code: [email or web address] URL: [email or web address] Date: 03/28/2023 Prepared by: Carlynn Herald  Exercises - Prone Hamstring Curl  with Anchored Resistance  - 1 x daily - 7 x weekly - 2 sets - 10 reps - Modified Thomas Stretch  - 1 x daily - 7 x weekly - 2 sets - 30 sec hold - X Band Walk  - 1 x daily - 7 x weekly - 2 sets - 10 reps - Bridge with [employer] on [employer]  - 1 x daily - 7 x weekly - 2 sets - 10 reps - Sidelying Femoral Nerve Glide - Top Leg  - 1 x daily - 7 x weekly - 1 sets - 10 reps - Half Kneeling Hip Flexor Stretch  - 1 x daily - 7 x weekly - 1 sets - 1 min hold - Standing Hamstring Curl with Resistance  - 1 x daily - 7 x weekly - 2 sets - 10 reps - Standing Hip Flexor Stretch  - 1 x daily - 7 x weekly - 2 sets - 30 sec hold - Standing Hamstring Stretch with Step  - 1 x daily - 7 x weekly - 2 sets - 30 sec hold - Side Lunge Adductor Stretch  - 1 x daily - 7 x weekly - 2 sets - 30 sec hold - Runner's Step Up/Down  - 1 x daily - 7 x weekly - 2 sets - 10 reps - Forward Step Down  - 1 x daily - 7 x weekly - 2 sets - 10 reps - Single Leg Isometric Heel Raise at Wall  - 1 x daily - 7 x weekly - 3 sets - 10 reps - Seated Hip Internal Rotation with Ball and Resistance  - 1 x daily - 7 x weekly - 3 sets - 10 reps  ASSESSMENT:  CLINICAL IMPRESSION: Hamstring exercises progressed from isometric activation to standing dynamic dead lift variations, monitoring symptoms due to subjective intake; patient tolerated progression well with no exacerbation  of symptoms. Dynamic balance activities with focus on R single leg stance/balance incorporated to progress and prepare patient for upcoming train/hiking trip. Recommended patient start walking with pack to progress endurance training for trip.   OBJECTIVE IMPAIRMENTS: decreased activity tolerance, decreased balance, decreased endurance, decreased mobility, difficulty walking, decreased ROM, decreased strength, increased edema, increased fascial restrictions, impaired flexibility, improper body mechanics, and pain.    GOALS: Goals reviewed with patient? Yes  SHORT  TERM GOALS: Target date: 03/07/2023  Pt will be ind with initial HEP Baseline: Goal status: MET  2.  Pt will demo L = R knee AROM to demo improving hamstring mobility Baseline:  Goal status: MET   LONG TERM GOALS: Target date: 04/04/2023  Pt will be ind with management and progression of HEP Baseline:  Goal status: INITIAL  2.  Pt will be able to tolerate single leg stability on R LE x 30 sec Baseline:  Goal status: INITIAL  3.  Pt will be able to walk at least 3 miles without exacerbation of pain to >/=1/10 Baseline:  Goal status: INITIAL  4.  Pt will be able to squat and lift at least 10# to demo increased bilat LE strength Baseline:  Goal status: INITIAL  5.  Pt will have increased LEFS to >/=70/80 to demo MCID Baseline:  Goal status: INITIAL   PLAN:  PT FREQUENCY: 1-2x/week  PT DURATION: 8 weeks  PLANNED INTERVENTIONS: Therapeutic exercises, Therapeutic activity, Neuromuscular re-education, Balance training, Gait training, Patient/Family education, Self Care, Joint mobilization, Aquatic Therapy, Dry Needling, Electrical stimulation, Cryotherapy, Moist heat, Taping, Vasopneumatic device, Ionotophoresis 4mg /ml Dexamethasone, Manual therapy, and Re-evaluation  PLAN FOR NEXT SESSION: Re-Eval 8/27. Continue to progress strengthening and eccentrics   Sanjuana Mae, PTA 03/30/2023, 10:05 AM

## 2023-03-31 ENCOUNTER — Encounter: Payer: Self-pay | Admitting: Sports Medicine

## 2023-03-31 ENCOUNTER — Ambulatory Visit (INDEPENDENT_AMBULATORY_CARE_PROVIDER_SITE_OTHER): Payer: Medicare PPO | Admitting: Sports Medicine

## 2023-03-31 DIAGNOSIS — S76311A Strain of muscle, fascia and tendon of the posterior muscle group at thigh level, right thigh, initial encounter: Secondary | ICD-10-CM

## 2023-03-31 MED ORDER — HYDROCODONE-ACETAMINOPHEN 5-325 MG PO TABS: 1.0000 | ORAL_TABLET | Freq: Three times a day (TID) | ORAL | 0 refills | Status: DC | PRN

## 2023-03-31 NOTE — Progress Notes (Signed)
    Procedures performed today:    None.  Independent interpretation of notes and tests performed by another provider:   None.  Brief History, Exam, Impression, and Recommendations:    Tear of right hamstring This is a very pleasant 67 year old female, back in May she took a misstep and felt a pop right buttock, she had some pain, ultimately she was not treated until a month later in June, ultimately an MRI was obtained that did show some biceps femoris tearing as well as an ischiogluteal bursitis. We added a thigh compression sleeve, formal physical therapy, she continues to improve, today she really does not have any pain, good strength, good motion. She did have a bit of discomfort when helping her mom move house. She has a trip coming up to [place], she is not really hurting enough for me to consider an injection so we will give her some hydrocodone to keep on the trip to use as needed, and she can return to see me in 2 months.  Chronic process not at goal with pharmacologic intervention  ____________________________________________ Ihor Austin. Benjamin Stain, M.D., ABFM., CAQSM., AME. Primary Care and Sports Medicine [facility]  Adjunct Professor of Family Medicine  [employer]  Restaurant manager, fast food

## 2023-03-31 NOTE — Assessment & Plan Note (Signed)
This is a very pleasant 67 year old female, back in May she took a misstep and felt a pop right buttock, she had some pain, ultimately she was not treated until a month later in June, ultimately an MRI was obtained that did show some biceps femoris tearing as well as an ischiogluteal bursitis. We added a thigh compression sleeve, formal physical therapy, she continues to improve, today she really does not have any pain, good strength, good motion. She did have a bit of discomfort when helping her mom move house. She has a trip coming up to [place], she is not really hurting enough for me to consider an injection so we will give her some hydrocodone to keep on the trip to use as needed, and she can return to see me in 2 months.

## 2023-04-01 ENCOUNTER — Encounter: Payer: Self-pay | Admitting: Sports Medicine

## 2023-04-04 ENCOUNTER — Ambulatory Visit: Payer: Medicare PPO | Admitting: Physical Therapy

## 2023-04-04 ENCOUNTER — Encounter: Payer: Self-pay | Admitting: Physical Therapy

## 2023-04-04 DIAGNOSIS — M6281 Muscle weakness (generalized): Secondary | ICD-10-CM | POA: Diagnosis not present

## 2023-04-04 DIAGNOSIS — M25551 Pain in right hip: Secondary | ICD-10-CM | POA: Diagnosis not present

## 2023-04-04 DIAGNOSIS — R2689 Other abnormalities of gait and mobility: Secondary | ICD-10-CM

## 2023-04-04 DIAGNOSIS — M25651 Stiffness of right hip, not elsewhere classified: Secondary | ICD-10-CM | POA: Diagnosis not present

## 2023-04-04 NOTE — Therapy (Signed)
OUTPATIENT PHYSICAL THERAPY LOWER EXTREMITY TREATMENT AND RECERTIFICATION   Patient Name: Becky Bowers MRN: [id number] DOB:05/19/1956, 67 y.o., female Today's Date: 04/04/2023  END OF SESSION:  PT End of Session - 04/04/23 1058     Visit Number 15    Number of Visits 16    Date for PT Re-Evaluation 05/17/23    Authorization Type [employer]    Authorization Time Period 16 VISITS APPROVED FOR PT 02/07/2023-04/04/2023: Requested 6 visits 8/28 to 10/9 due to vacation gone until 9/18    Authorization - Visit Number 15    Authorization - Number of Visits 16    Progress Note Due on Visit 25    PT Start Time 1100    PT Stop Time 1147    PT Time Calculation (min) 47 min    Activity Tolerance Patient tolerated treatment well    Behavior During Therapy [facility] for tasks assessed/performed               Past Medical History:  Diagnosis Date   Arthritis    Concussion 09/12/2019   Past Surgical History:  Procedure Laterality Date   APPENDECTOMY  1968   DILATION AND CURETTAGE OF UTERUS  1990   TONSILLECTOMY  1980   Patient Active Problem List   Diagnosis Date Noted   Tear of right hamstring 02/03/2023   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   Elevated LDL cholesterol level 04/14/2021   Polyarthralgia 03/17/2021   Primary osteoarthritis right first [facility] and right second MCP 04/24/2018   Numbness of right third and fourth toes 03/19/2018   Primary osteoarthritis of both hips 12/08/2015   Headache 08/12/2010   Lumbar degenerative disc disease 01/07/2008    PCP: Agapito Games, MD  REFERRING PROVIDER: Monica Becton, MD  REFERRING DIAG: [phone] (ICD-10-CM) - Tear of right hamstring  THERAPY DIAG:  Pain in right hip  Stiffness of right hip, not elsewhere classified  Muscle weakness (generalized)  Other abnormalities of gait and mobility  Rationale for Evaluation and Treatment: Rehabilitation  ONSET DATE: Around May  16  SUBJECTIVE:   SUBJECTIVE STATEMENT: If I do a lot during the day it definitely gets swollen. Not to previous level yet. Able to walk about 1.5 miles now with weighted backpack.   PERTINENT HISTORY: No history of injuries From eval: Pt states she had an inner ear issue and lost her balance. Pt was standing putting together a rocking chair and slipped/jerked her body and felt something pop in her R posterior hip. Inner ear stuff got cleared up but had continued R posterior hip pain. Pt was walking 3.5 miles/day (has not been walking). Does pilates normally and stopped for now. Pain in general has been decreasing.   PAIN:  Are you having pain? Yes: NPRS scale: 1 currently, at worst 3/10 Pain location: R groin area Pain description: dull ache Aggravating factors: Prolonged sitting, mornings getting out of bed  Relieving factors: Ice  PRECAUTIONS:  From Dr. Melvia Heaps referral notes: Patient is a right biceps femoris tear, minimal retraction, ischiogluteal bursitis, needs to work on passive range of motion for a week, active range of motion for 1 to 2 weeks, and then ECCENTRIC strengthening. Please minimize stretching at least for the first couple of weeks.  WEIGHT BEARING RESTRICTIONS: No  FALLS:  Has patient fallen in last 6 months? No  LIVING ENVIRONMENT: Lives with: lives with their spouse Lives in: House/apartment Stairs: Yes: External: 2 steps; none Has following equipment at home: None  OCCUPATION: Retired  PLOF: Independent  PATIENT GOALS: Return to all normal functions  NEXT MD VISIT: August  OBJECTIVE:   DIAGNOSTIC FINDINGS: MRI 01/21/23 IMPRESSION: 1. Right hamstring tendinosis with a full-thickness minimally retracted tear of the biceps femoris tendon origin with peritendinous edema. 2. Moderate volume fluid within the right ischiogluteal bursa. 3. Mild tendinosis of the contralateral left hamstring origin. 4. Mild tendinosis of the bilateral gluteus medius and  minimus tendons without tear. 5. Mild osteoarthritis of the right hip.  PATIENT SURVEYS:  LEFS 60/80 = 75%   04/04/23 58 / 80 = 72.5 %  EDEMA:  Mild in R buttock  MUSCLE LENGTH: Hamstrings: deferred per precautions Thomas test: WNL  POSTURE:  bilat knees slightly bent  PALPATION: R glute, piriformis tenderness  LOWER EXTREMITY ROM:  Active ROM Right eval Left eval Right 03/09/23 Left 03/09/23  Hip flexion      Hip extension      Hip abduction      Hip adduction      Hip internal rotation      Hip external rotation      Knee flexion 120 135 125 125  Knee extension 0 0 0 0  Ankle dorsiflexion      Ankle plantarflexion      Ankle inversion      Ankle eversion       (Blank rows = not tested)  LOWER EXTREMITY MMT:  MMT Right eval Left eval  Hip flexion 5 5  Hip extension 3- 4  Hip abduction 4- 4  Hip adduction    Hip internal rotation deferred 4  Hip external rotation deferred 3+  Knee flexion deferred 5  Knee extension 5 5  Ankle dorsiflexion    Ankle plantarflexion    Ankle inversion    Ankle eversion     (Blank rows = not tested)  LOWER EXTREMITY SPECIAL TESTS:  Hip special tests: Luisa Hart (FABER) test: positive , Thomas test: negative, and Ely's test: negative  FUNCTIONAL TESTS:  Deferred  GAIT: Mildly antalgic on R with knee remaining slightly flexed   TODAY'S TREATMENT:    [facility] Adult PT Treatment:                                                DATE: 04/04/2023 Therapeutic Exercise: Treadmill 2.5 mph, incline 0-10% x 8 min R SLS x 30 sec Functional squat x 10 Staggered stance deadlift x 10, then with orange super band x10 Modified RDL reach R with R UE support on table x 10 Sliders - bridge with R knee slide out x 10 Standing deadlift with orange super band x 5 Deadlift with 10# KB x 10 - cues for chest out Therapeutic Activiites: Goals Assessed, LEFS   [facility] Adult PT Treatment:                                                DATE:  03/30/2023 Therapeutic Exercise: Treadmill 2.5 mph, incline 0-10% x 8 min Supine HS set 10x5" Prone HS curl YTB (light resistance) x10 Prone bent knee hip extension x10 Standing deadlift with orange super band x10 Bkwd walking --> bkwd marching Staggered stance deadlift with orange super band x10 Push/pull sled --> added 20#  Therapeutic Activity: Airex: Single leg balance (R) 3-way hip RTB crossed at ankles (R SLS) Kickstand R + 10#KB swing on same & opposite side Sow fwd/bkwd marching with 10#KB suitcase carry (both sides) Self Care: Walking with pack in prep/training for upcoming trip   [facility] Adult PT Treatment:                                                DATE: 03/28/2023 Therapeutic Exercise: Treadmill 2.5 mph x 5 min HS stretch on step  HS/glute iso with foot against (B, different cues) S/L bent knee hip abd (RTB) --> hip abd iso with knee extension Supine table top with ball b/w knees: hip IR AROM + abdominal bracing Seated R hip IR (ball b/w knees) --> added YTB Standing squats on bosu platform Isometric R single leg heel raise   [facility] Adult PT Treatment:                                                DATE: 03/21/23 Therapeutic Exercise: Treadmill 2.5 mph x 8 min Standing Hamstring stretch 2x 30 sec Adductor stretch x 30 sec Hip flexor stretch x 30 sec 3 way hip red TB 2x10 Runner's step up with HHA 2x10 6" step  Forward step down 6" step 2x10 Manual Therapy: STM & TPR R TFL and quad Skilled assessment and palpation for TPDN Trigger Point Dry-Needling  Treatment instructions: Expect mild to moderate muscle soreness. S/S of pneumothorax if dry needled over a lung field, and to seek immediate medical attention should they occur. Patient verbalized understanding of these instructions and education.  Patient Consent Given: Yes Education handout provided: Yes Muscles treated: R TFL, quad Electrical stimulation performed: No Parameters: N/A Treatment  response/outcome: Twitch response illicited, decreased muscle tension   PATIENT EDUCATION:  Education details: Updated HEP Person educated: Patient Education method: Explanation, Demonstration, and Handouts Education comprehension: verbalized understanding, returned demonstration, and needs further education  HOME EXERCISE PROGRAM: Access Code: [email or web address] URL: [email or web address] Date: 04/04/2023 Prepared by: Raynelle Fanning  Exercises - Prone Hamstring Curl with Anchored Resistance  - 1 x daily - 7 x weekly - 2 sets - 10 reps - Modified Thomas Stretch  - 1 x daily - 7 x weekly - 2 sets - 30 sec hold - X Band Walk  - 1 x daily - 7 x weekly - 2 sets - 10 reps - Bridge with [employer] on [employer]  - 1 x daily - 7 x weekly - 2 sets - 10 reps - Sidelying Femoral Nerve Glide - Top Leg  - 1 x daily - 7 x weekly - 1 sets - 10 reps - Half Kneeling Hip Flexor Stretch  - 1 x daily - 7 x weekly - 1 sets - 1 min hold - Standing Hamstring Curl with Resistance  - 1 x daily - 7 x weekly - 2 sets - 10 reps - Standing Hip Flexor Stretch  - 1 x daily - 7 x weekly - 2 sets - 30 sec hold - Standing Hamstring Stretch with Step  - 1 x daily - 7 x weekly - 2 sets - 30 sec hold - Side Lunge Adductor Stretch  - 1 x daily - 7  x weekly - 2 sets - 30 sec hold - Runner's Step Up/Down  - 1 x daily - 7 x weekly - 2 sets - 10 reps - Forward Step Down  - 1 x daily - 7 x weekly - 2 sets - 10 reps - Single Leg Isometric Heel Raise at Wall  - 1 x daily - 7 x weekly - 3 sets - 10 reps - Seated Hip Internal Rotation with Ball and Resistance  - 1 x daily - 7 x weekly - 3 sets - 10 reps - Single-Leg [place] Deadlift With Dumbbell  - 1 x daily - 3-4 x weekly - 1-2 sets - 10 reps - Supine Single Leg Eccentric Hamstring Bridge with Slider  - 1 x daily - 3-4 x weekly - 1-2 sets - 10 reps - Half Deadlift with Kettlebell  - 1 x daily - 3-4 x weekly - 1-2 sets - 10 reps reps  ASSESSMENT:  CLINICAL  IMPRESSION: [name] "Becky Bowers" is progressing toward her LTGs but is still limited functionally by her R HS. She has increased tolerance to walking, but is not at her previous level yet. Additionally, if she does her normal house work and is up on her leg all day it will swell. Her strength is improving but functionally she demonstrates LE weakness with squats and single leg activities.Her LEFS shows only 2.5% improvement thus far.  Becky Bowers is leaving in 1.5 weeks for a cross [place] train trip and will return 04/26/23. Plan is to see her 1-2 more visits prior to the trip and then upon her return to check status and continue Pt if indicated. She continues to demonstrate potential for improvement and would benefit from continued skilled therapy to address impairments.     OBJECTIVE IMPAIRMENTS: decreased activity tolerance, decreased balance, decreased endurance, decreased mobility, difficulty walking, decreased ROM, decreased strength, increased edema, increased fascial restrictions, impaired flexibility, improper body mechanics, and pain.    GOALS: Goals reviewed with patient? Yes  SHORT TERM GOALS: Target date: 03/07/2023  Pt will be ind with initial HEP Baseline: Goal status: MET  2.  Pt will demo L = R knee AROM to demo improving hamstring mobility Baseline:  Goal status: MET   LONG TERM GOALS: Target date: 04/04/2023 extended to 05/17/23  Pt will be ind with management and progression of HEP Baseline:  Goal status: IN PROGRESS  2.  Pt will be able to tolerate single leg stability on R LE x 30 sec Baseline:  Goal status: MET  3.  Pt will be able to walk at least 3 miles without exacerbation of pain to >/=1/10 Baseline:  Goal status: IN PROGRESS  4.  Pt will be able to squat and lift at least 10# to demo increased bilat LE strength Baseline:  Goal status: IN PROGRESS  5.  Pt will have increased LEFS to >/=70/80 to demo MCID Baseline:  Goal status: IN PROGRESS 58 / 80 = 72.5  %   PLAN:  PT FREQUENCY: 1-2x/week  PT DURATION: 8 weeks  PLANNED INTERVENTIONS: Therapeutic exercises, Therapeutic activity, Neuromuscular re-education, Balance training, Gait training, Patient/Family education, Self Care, Joint mobilization, Aquatic Therapy, Dry Needling, Electrical stimulation, Cryotherapy, Moist heat, Taping, Vasopneumatic device, Ionotophoresis 4mg /ml Dexamethasone, Manual therapy, and Re-evaluation  PLAN FOR NEXT SESSION: Continue to progress strengthening and eccentrics  Solon Palm, PT  04/04/2023, 1:22 PM

## 2023-04-05 ENCOUNTER — Encounter: Payer: Self-pay | Admitting: Family Medicine

## 2023-04-06 ENCOUNTER — Ambulatory Visit: Payer: Medicare PPO

## 2023-04-06 DIAGNOSIS — M25551 Pain in right hip: Secondary | ICD-10-CM

## 2023-04-06 DIAGNOSIS — M6281 Muscle weakness (generalized): Secondary | ICD-10-CM

## 2023-04-06 DIAGNOSIS — M25651 Stiffness of right hip, not elsewhere classified: Secondary | ICD-10-CM

## 2023-04-06 DIAGNOSIS — R2689 Other abnormalities of gait and mobility: Secondary | ICD-10-CM

## 2023-04-06 NOTE — Therapy (Addendum)
OUTPATIENT PHYSICAL THERAPY LOWER EXTREMITY TREATMENT    Patient Name: Becky Bowers MRN: [id number] DOB:[birth year], 67 y.o., female Today's Date: 04/06/2023  END OF SESSION:  PT End of Session - 04/06/23 1018     Visit Number 16    Number of Visits 16    Date for PT Re-Evaluation 05/17/23    Authorization Type [employer]    Authorization Time Period 16 VISITS APPROVED FOR PT 02/07/2023-04/04/2023: Requested 6 visits 8/28 to 10/9 due to vacation gone until 9/18    Authorization - Visit Number 16    Authorization - Number of Visits 16    Progress Note Due on Visit 25    PT Start Time 1017    PT Stop Time 1057    PT Time Calculation (min) 40 min    Activity Tolerance Patient tolerated treatment well    Behavior During Therapy [facility] for tasks assessed/performed               Past Medical History:  Diagnosis Date   Arthritis    Concussion 09/12/2019   Past Surgical History:  Procedure Laterality Date   APPENDECTOMY  1968   DILATION AND CURETTAGE OF UTERUS  1990   TONSILLECTOMY  1980   Patient Active Problem List   Diagnosis Date Noted   Tear of right hamstring 02/03/2023   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   Elevated LDL cholesterol level 04/14/2021   Polyarthralgia 03/17/2021   Primary osteoarthritis right first [facility] and right second MCP 04/24/2018   Numbness of right third and fourth toes 03/19/2018   Primary osteoarthritis of both hips 12/08/2015   Headache 08/12/2010   Lumbar degenerative disc disease 01/07/2008    PCP: Agapito Games, MD  REFERRING PROVIDER: Monica Becton, MD  REFERRING DIAG: [phone] (ICD-10-CM) - Tear of right hamstring  THERAPY DIAG:  Pain in right hip  Stiffness of right hip, not elsewhere classified  Muscle weakness (generalized)  Other abnormalities of gait and mobility  Rationale for Evaluation and Treatment: Rehabilitation  ONSET DATE: Around May 16  SUBJECTIVE:   SUBJECTIVE  STATEMENT: Patient reports she continues to have swelling at end of day, no significant changes in symptoms since last visit.  PERTINENT HISTORY: No history of injuries From eval: Pt states she had an inner ear issue and lost her balance. Pt was standing putting together a rocking chair and slipped/jerked her body and felt something pop in her R posterior hip. Inner ear stuff got cleared up but had continued R posterior hip pain. Pt was walking 3.5 miles/day (has not been walking). Does pilates normally and stopped for now. Pain in general has been decreasing.   PAIN:  Are you having pain? Yes: NPRS scale: 1 currently, at worst 3/10 Pain location: R groin area Pain description: dull ache Aggravating factors: Prolonged sitting, mornings getting out of bed  Relieving factors: Ice  PRECAUTIONS:  From Dr. Melvia Heaps referral notes: Patient is a right biceps femoris tear, minimal retraction, ischiogluteal bursitis, needs to work on passive range of motion for a week, active range of motion for 1 to 2 weeks, and then ECCENTRIC strengthening. Please minimize stretching at least for the first couple of weeks.  WEIGHT BEARING RESTRICTIONS: No  FALLS:  Has patient fallen in last 6 months? No  LIVING ENVIRONMENT: Lives with: lives with their spouse Lives in: House/apartment Stairs: Yes: External: 2 steps; none Has following equipment at home: None  OCCUPATION: Retired  PLOF: Independent  PATIENT GOALS: Return  to all normal functions  NEXT MD VISIT: August  OBJECTIVE:   DIAGNOSTIC FINDINGS: MRI 01/21/23 IMPRESSION: 1. Right hamstring tendinosis with a full-thickness minimally retracted tear of the biceps femoris tendon origin with peritendinous edema. 2. Moderate volume fluid within the right ischiogluteal bursa. 3. Mild tendinosis of the contralateral left hamstring origin. 4. Mild tendinosis of the bilateral gluteus medius and minimus tendons without tear. 5. Mild osteoarthritis of the  right hip.  PATIENT SURVEYS:  LEFS 60/80 = 75%   04/04/23 58 / 80 = 72.5 %  EDEMA:  Mild in R buttock  MUSCLE LENGTH: Hamstrings: deferred per precautions Thomas test: WNL  POSTURE:  bilat knees slightly bent  PALPATION: R glute, piriformis tenderness  LOWER EXTREMITY ROM:  Active ROM Right eval Left eval Right 03/09/23 Left 03/09/23  Hip flexion      Hip extension      Hip abduction      Hip adduction      Hip internal rotation      Hip external rotation      Knee flexion 120 135 125 125  Knee extension 0 0 0 0  Ankle dorsiflexion      Ankle plantarflexion      Ankle inversion      Ankle eversion       (Blank rows = not tested)  LOWER EXTREMITY MMT:  MMT Right eval Left eval  Hip flexion 5 5  Hip extension 3- 4  Hip abduction 4- 4  Hip adduction    Hip internal rotation deferred 4  Hip external rotation deferred 3+  Knee flexion deferred 5  Knee extension 5 5  Ankle dorsiflexion    Ankle plantarflexion    Ankle inversion    Ankle eversion     (Blank rows = not tested)  LOWER EXTREMITY SPECIAL TESTS:  Hip special tests: Luisa Hart (FABER) test: positive , Thomas test: negative, and Ely's test: negative  FUNCTIONAL TESTS:  Deferred  GAIT: Mildly antalgic on R with knee remaining slightly flexed   TODAY'S TREATMENT:    [facility] Adult PT Treatment:                                                DATE: 04/06/2023 Therapeutic Exercise: Treadmill 3.0 mph, 0-10% incline x 8 min Pilates ab series Primal push-up  Forearm plank --> added knee taps Dynamic side lunges (add stretch) (R) lateral slider lunges SLS half moon circles with slider (B) Modified single leg deadlift with orange superband (R) Diver + high knee drive up with orange super band (B) Seated HS stretch (B) Bridge + single leg slider in/out (B)    [facility] Adult PT Treatment:                                                DATE: 04/04/2023 Therapeutic Exercise: Treadmill 2.5 mph, incline 0-10% x 8  min R SLS x 30 sec Functional squat x 10 Staggered stance deadlift x 10, then with orange super band x10 Modified RDL reach R with R UE support on table x 10 Sliders - bridge with R knee slide out x 10 Standing deadlift with orange super band x 5 Deadlift with 10# KB x 10 - cues  for chest out Therapeutic Activiites: Goals Assessed, LEFS    [facility] Adult PT Treatment:                                                DATE: 03/21/23 Therapeutic Exercise: Treadmill 2.5 mph x 8 min Standing Hamstring stretch 2x 30 sec Adductor stretch x 30 sec Hip flexor stretch x 30 sec 3 way hip red TB 2x10 Runner's step up with HHA 2x10 6" step  Forward step down 6" step 2x10 Manual Therapy: STM & TPR R TFL and quad Skilled assessment and palpation for TPDN Trigger Point Dry-Needling  Treatment instructions: Expect mild to moderate muscle soreness. S/S of pneumothorax if dry needled over a lung field, and to seek immediate medical attention should they occur. Patient verbalized understanding of these instructions and education.  Patient Consent Given: Yes Education handout provided: Yes Muscles treated: R TFL, quad Electrical stimulation performed: No Parameters: N/A Treatment response/outcome: Twitch response illicited, decreased muscle tension   PATIENT EDUCATION:  Education details: Updated HEP Person educated: Patient Education method: Explanation, Demonstration, and Handouts Education comprehension: verbalized understanding, returned demonstration, and needs further education  HOME EXERCISE PROGRAM: Access Code: [email or web address] URL: [email or web address] Date: 04/04/2023 Prepared by: Raynelle Fanning  Exercises - Prone Hamstring Curl with Anchored Resistance  - 1 x daily - 7 x weekly - 2 sets - 10 reps - Modified Thomas Stretch  - 1 x daily - 7 x weekly - 2 sets - 30 sec hold - X Band Walk  - 1 x daily - 7 x weekly - 2 sets - 10 reps - Bridge with [employer] on [employer]  - 1 x daily  - 7 x weekly - 2 sets - 10 reps - Sidelying Femoral Nerve Glide - Top Leg  - 1 x daily - 7 x weekly - 1 sets - 10 reps - Half Kneeling Hip Flexor Stretch  - 1 x daily - 7 x weekly - 1 sets - 1 min hold - Standing Hamstring Curl with Resistance  - 1 x daily - 7 x weekly - 2 sets - 10 reps - Standing Hip Flexor Stretch  - 1 x daily - 7 x weekly - 2 sets - 30 sec hold - Standing Hamstring Stretch with Step  - 1 x daily - 7 x weekly - 2 sets - 30 sec hold - Side Lunge Adductor Stretch  - 1 x daily - 7 x weekly - 2 sets - 30 sec hold - Runner's Step Up/Down  - 1 x daily - 7 x weekly - 2 sets - 10 reps - Forward Step Down  - 1 x daily - 7 x weekly - 2 sets - 10 reps - Single Leg Isometric Heel Raise at Wall  - 1 x daily - 7 x weekly - 3 sets - 10 reps - Seated Hip Internal Rotation with Ball and Resistance  - 1 x daily - 7 x weekly - 3 sets - 10 reps - Single-Leg [place] Deadlift With Dumbbell  - 1 x daily - 3-4 x weekly - 1-2 sets - 10 reps - Supine Single Leg Eccentric Hamstring Bridge with Slider  - 1 x daily - 3-4 x weekly - 1-2 sets - 10 reps - Half Deadlift with Kettlebell  - 1 x daily - 3-4 x weekly - 1-2 sets - 10  reps reps  ASSESSMENT:  CLINICAL IMPRESSION: Glute isometric and eccentric strengthening exercises continued; tactile cues provided to improve ribcage stability and pelvic alignment. Core strengthening exercises incorporated in session to address overall postural stability and support for upcoming trip; recommended patient incorporate core exercises with daily exercises.   OBJECTIVE IMPAIRMENTS: decreased activity tolerance, decreased balance, decreased endurance, decreased mobility, difficulty walking, decreased ROM, decreased strength, increased edema, increased fascial restrictions, impaired flexibility, improper body mechanics, and pain.    GOALS: Goals reviewed with patient? Yes  SHORT TERM GOALS: Target date: 03/07/2023  Pt will be ind with initial HEP Baseline: Goal  status: MET  2.  Pt will demo L = R knee AROM to demo improving hamstring mobility Baseline:  Goal status: MET   LONG TERM GOALS: Target date: 05/17/2023  Pt will be ind with management and progression of HEP Baseline:  Goal status: IN PROGRESS  2.  Pt will be able to tolerate single leg stability on R LE x 30 sec Baseline:  Goal status: MET  3.  Pt will be able to walk at least 3 miles without exacerbation of pain to >/=1/10 Baseline:  Goal status: IN PROGRESS  4.  Pt will be able to squat and lift at least 10# to demo increased bilat LE strength Baseline:  Goal status: IN PROGRESS  5.  Pt will have increased LEFS to >/=70/80 to demo MCID Baseline:  Goal status: IN PROGRESS 58 / 80 = 72.5 %   PLAN:  PT FREQUENCY: 1-2x/week  PT DURATION: 8 weeks  PLANNED INTERVENTIONS: Therapeutic exercises, Therapeutic activity, Neuromuscular re-education, Balance training, Gait training, Patient/Family education, Self Care, Joint mobilization, Aquatic Therapy, Dry Needling, Electrical stimulation, Cryotherapy, Moist heat, Taping, Vasopneumatic device, Ionotophoresis 4mg /ml Dexamethasone, Manual therapy, and Re-evaluation  PLAN FOR NEXT SESSION: Continue to progress strengthening and eccentrics  Carlynn Herald, PTA 04/06/2023, 10:58 AM

## 2023-04-11 NOTE — Therapy (Signed)
OUTPATIENT PHYSICAL THERAPY LOWER EXTREMITY TREATMENT    Patient Name: Becky Bowers MRN: [id number] DOB:[birth year], 67 y.o., female Today's Date: 04/12/2023  END OF SESSION:  PT End of Session - 04/12/23 1020     Visit Number 17    Date for PT Re-Evaluation 05/17/23    Authorization Type [employer]    Authorization Time Period 16 VISITS APPROVED FOR PT 02/07/2023-04/04/2023: Requested 6 visits 8/28 to 10/9 due to vacation gone until 9/18    Authorization - Visit Number 17    Progress Note Due on Visit 25    PT Start Time 1021    PT Stop Time 1101    PT Time Calculation (min) 40 min    Activity Tolerance Patient tolerated treatment well    Behavior During Therapy [facility] for tasks assessed/performed                Past Medical History:  Diagnosis Date   Arthritis    Concussion 09/12/2019   Past Surgical History:  Procedure Laterality Date   APPENDECTOMY  1968   DILATION AND CURETTAGE OF UTERUS  1990   TONSILLECTOMY  1980   Patient Active Problem List   Diagnosis Date Noted   Tear of right hamstring 02/03/2023   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   Elevated LDL cholesterol level 04/14/2021   Polyarthralgia 03/17/2021   Primary osteoarthritis right first [facility] and right second MCP 04/24/2018   Numbness of right third and fourth toes 03/19/2018   Primary osteoarthritis of both hips 12/08/2015   Headache 08/12/2010   Lumbar degenerative disc disease 01/07/2008    PCP: Agapito Games, MD  REFERRING PROVIDER: Monica Becton, MD  REFERRING DIAG: [phone] (ICD-10-CM) - Tear of right hamstring  THERAPY DIAG:  Pain in right hip  Stiffness of right hip, not elsewhere classified  Muscle weakness (generalized)  Other abnormalities of gait and mobility  Rationale for Evaluation and Treatment: Rehabilitation  ONSET DATE: Around May 16  SUBJECTIVE:   SUBJECTIVE STATEMENT: Several things I can do without pain now, like  kicking her shoe off in standing. Able to walk 2.5 miles in neighborhood, but still wearing brace. Will try to wean when she gets back. Already not wearing for trips to store.   PERTINENT HISTORY: No history of injuries From eval: Pt states she had an inner ear issue and lost her balance. Pt was standing putting together a rocking chair and slipped/jerked her body and felt something pop in her R posterior hip. Inner ear stuff got cleared up but had continued R posterior hip pain. Pt was walking 3.5 miles/day (has not been walking). Does pilates normally and stopped for now. Pain in general has been decreasing.   PAIN:  Are you having pain? Yes: NPRS scale: 1 currently, at worst 3/10 Pain location: R groin area Pain description: dull ache Aggravating factors: Prolonged sitting, mornings getting out of bed  Relieving factors: Ice  PRECAUTIONS:  From Dr. Melvia Heaps referral notes: Patient is a right biceps femoris tear, minimal retraction, ischiogluteal bursitis, needs to work on passive range of motion for a week, active range of motion for 1 to 2 weeks, and then ECCENTRIC strengthening. Please minimize stretching at least for the first couple of weeks.  WEIGHT BEARING RESTRICTIONS: No  FALLS:  Has patient fallen in last 6 months? No  LIVING ENVIRONMENT: Lives with: lives with their spouse Lives in: House/apartment Stairs: Yes: External: 2 steps; none Has following equipment at home: None  OCCUPATION:  Retired  PLOF: Independent  PATIENT GOALS: Return to all normal functions  NEXT MD VISIT: August  OBJECTIVE:   DIAGNOSTIC FINDINGS: MRI 01/21/23 IMPRESSION: 1. Right hamstring tendinosis with a full-thickness minimally retracted tear of the biceps femoris tendon origin with peritendinous edema. 2. Moderate volume fluid within the right ischiogluteal bursa. 3. Mild tendinosis of the contralateral left hamstring origin. 4. Mild tendinosis of the bilateral gluteus medius and  minimus tendons without tear. 5. Mild osteoarthritis of the right hip.  PATIENT SURVEYS:  LEFS 60/80 = 75%   04/04/23 58 / 80 = 72.5 %  EDEMA:  Mild in R buttock  MUSCLE LENGTH: Hamstrings: deferred per precautions Thomas test: WNL  POSTURE:  bilat knees slightly bent  PALPATION: R glute, piriformis tenderness  LOWER EXTREMITY ROM:  Active ROM Right eval Left eval Right 03/09/23 Left 03/09/23  Hip flexion      Hip extension      Hip abduction      Hip adduction      Hip internal rotation      Hip external rotation      Knee flexion 120 135 125 125  Knee extension 0 0 0 0  Ankle dorsiflexion      Ankle plantarflexion      Ankle inversion      Ankle eversion       (Blank rows = not tested)  LOWER EXTREMITY MMT:  MMT Right eval Left eval  Hip flexion 5 5  Hip extension 3- 4  Hip abduction 4- 4  Hip adduction    Hip internal rotation deferred 4  Hip external rotation deferred 3+  Knee flexion deferred 5  Knee extension 5 5  Ankle dorsiflexion    Ankle plantarflexion    Ankle inversion    Ankle eversion     (Blank rows = not tested)  LOWER EXTREMITY SPECIAL TESTS:  Hip special tests: Luisa Hart (FABER) test: positive , Thomas test: negative, and Ely's test: negative  FUNCTIONAL TESTS:  Deferred  GAIT: Mildly antalgic on R with knee remaining slightly flexed   TODAY'S TREATMENT:    [facility] Adult PT Treatment:                                                DATE: 04/12/2023 Therapeutic Exercise: Treadmill 3.0 mph, 0-10% incline x 8 min Pilates ab series Primal push-up  Forearm plank --> added knee taps, hip ABD taps, hip ext and hip taps Dynamic side lunges from wide stance, then kneeling ADD stretch sitting back on heel x 30 sec ea (B) lateral slider lunges SLS half moon circles with slider (B) then SLS with clocks in air Modified single leg deadlift with orange superband (R)  [facility] Adult PT Treatment:                                                DATE:  04/06/2023 Therapeutic Exercise: Treadmill 3.0 mph, 0-10% incline x 8 min Pilates ab series Primal push-up  Forearm plank --> added knee taps Dynamic side lunges (add stretch) (R) lateral slider lunges SLS half moon circles with slider (B) Modified single leg deadlift with orange superband (R) Diver + high knee drive up with orange super band (B)  Seated HS stretch (B) Bridge + single leg slider in/out (B)    [facility] Adult PT Treatment:                                                DATE: 04/04/2023 Therapeutic Exercise: Treadmill 2.5 mph, incline 0-10% x 8 min R SLS x 30 sec Functional squat x 10 Staggered stance deadlift x 10, then with orange super band x10 Modified RDL reach R with R UE support on table x 10 Sliders - bridge with R knee slide out x 10 Standing deadlift with orange super band x 5 Deadlift with 10# KB x 10 - cues for chest out Therapeutic Activiites: Goals Assessed, LEFS   HOME EXERCISE PROGRAM: Access Code: [email or web address] URL: [email or web address] Date: 04/04/2023 Prepared by: Raynelle Fanning  Exercises - Prone Hamstring Curl with Anchored Resistance  - 1 x daily - 7 x weekly - 2 sets - 10 reps - Modified Thomas Stretch  - 1 x daily - 7 x weekly - 2 sets - 30 sec hold - X Band Walk  - 1 x daily - 7 x weekly - 2 sets - 10 reps - Bridge with [employer] on [employer]  - 1 x daily - 7 x weekly - 2 sets - 10 reps - Sidelying Femoral Nerve Glide - Top Leg  - 1 x daily - 7 x weekly - 1 sets - 10 reps - Half Kneeling Hip Flexor Stretch  - 1 x daily - 7 x weekly - 1 sets - 1 min hold - Standing Hamstring Curl with Resistance  - 1 x daily - 7 x weekly - 2 sets - 10 reps - Standing Hip Flexor Stretch  - 1 x daily - 7 x weekly - 2 sets - 30 sec hold - Standing Hamstring Stretch with Step  - 1 x daily - 7 x weekly - 2 sets - 30 sec hold - Side Lunge Adductor Stretch  - 1 x daily - 7 x weekly - 2 sets - 30 sec hold - Runner's Step Up/Down  - 1 x daily - 7 x weekly - 2  sets - 10 reps - Forward Step Down  - 1 x daily - 7 x weekly - 2 sets - 10 reps - Single Leg Isometric Heel Raise at Wall  - 1 x daily - 7 x weekly - 3 sets - 10 reps - Seated Hip Internal Rotation with Ball and Resistance  - 1 x daily - 7 x weekly - 3 sets - 10 reps - Single-Leg [place] Deadlift With Dumbbell  - 1 x daily - 3-4 x weekly - 1-2 sets - 10 reps - Supine Single Leg Eccentric Hamstring Bridge with Slider  - 1 x daily - 3-4 x weekly - 1-2 sets - 10 reps - Half Deadlift with Kettlebell  - 1 x daily - 3-4 x weekly - 1-2 sets - 10 reps reps  ASSESSMENT:  CLINICAL IMPRESSION: [name] continues to progress with tolerance to SLS activities as well as core. We did not push much today as she is leaving on a two week trip where she hopes to hike a lot. She continues to demonstrate R LE weakness with there ex compared to the L side and decreased balance with higher challenges. She is independent with her HEP and plans to continue it as much  as possible while out of town. Plan to see patient for two f/u visits at the end of September and then d/c to HEP.   OBJECTIVE IMPAIRMENTS: decreased activity tolerance, decreased balance, decreased endurance, decreased mobility, difficulty walking, decreased ROM, decreased strength, increased edema, increased fascial restrictions, impaired flexibility, improper body mechanics, and pain.    GOALS: Goals reviewed with patient? Yes  SHORT TERM GOALS: Target date: 03/07/2023  Pt will be ind with initial HEP Baseline: Goal status: MET  2.  Pt will demo L = R knee AROM to demo improving hamstring mobility Baseline:  Goal status: MET   LONG TERM GOALS: Target date: 05/17/2023  Pt will be ind with management and progression of HEP Baseline:  Goal status: MET  2.  Pt will be able to tolerate single leg stability on R LE x 30 sec Baseline:  Goal status: MET  3.  Pt will be able to walk at least 3 miles without exacerbation of pain to >/=1/10 Baseline:   Goal status: IN PROGRESS 04/12/23 2.5 miles with brace  4.  Pt will be able to squat and lift at least 10# to demo increased bilat LE strength Baseline:  Goal status: IN PROGRESS  5.  Pt will have increased LEFS to >/=70/80 to demo MCID Baseline:  Goal status: IN PROGRESS 58 / 80 = 72.5 %   PLAN:  PT FREQUENCY: 1-2x/week  PT DURATION: 8 weeks  PLANNED INTERVENTIONS: Therapeutic exercises, Therapeutic activity, Neuromuscular re-education, Balance training, Gait training, Patient/Family education, Self Care, Joint mobilization, Aquatic Therapy, Dry Needling, Electrical stimulation, Cryotherapy, Moist heat, Taping, Vasopneumatic device, Ionotophoresis 4mg /ml Dexamethasone, Manual therapy, and Re-evaluation  PLAN FOR NEXT SESSION: Assess response to trip, Assess remaining goals, prepare for d/c  Solon Palm, PT  04/12/2023, 11:16 AM

## 2023-04-12 ENCOUNTER — Encounter: Payer: Self-pay | Admitting: Physical Therapy

## 2023-04-12 ENCOUNTER — Ambulatory Visit: Payer: Medicare PPO | Attending: Sports Medicine | Admitting: Physical Therapy

## 2023-04-12 DIAGNOSIS — M25551 Pain in right hip: Secondary | ICD-10-CM | POA: Insufficient documentation

## 2023-04-12 DIAGNOSIS — M25651 Stiffness of right hip, not elsewhere classified: Secondary | ICD-10-CM | POA: Diagnosis not present

## 2023-04-12 DIAGNOSIS — R2689 Other abnormalities of gait and mobility: Secondary | ICD-10-CM | POA: Insufficient documentation

## 2023-04-12 DIAGNOSIS — M6281 Muscle weakness (generalized): Secondary | ICD-10-CM | POA: Diagnosis not present

## 2023-05-02 ENCOUNTER — Ambulatory Visit: Payer: Medicare PPO

## 2023-05-02 DIAGNOSIS — R2689 Other abnormalities of gait and mobility: Secondary | ICD-10-CM | POA: Diagnosis not present

## 2023-05-02 DIAGNOSIS — M25651 Stiffness of right hip, not elsewhere classified: Secondary | ICD-10-CM | POA: Diagnosis not present

## 2023-05-02 DIAGNOSIS — M25551 Pain in right hip: Secondary | ICD-10-CM | POA: Diagnosis not present

## 2023-05-02 DIAGNOSIS — M6281 Muscle weakness (generalized): Secondary | ICD-10-CM | POA: Diagnosis not present

## 2023-05-02 NOTE — Therapy (Signed)
OUTPATIENT PHYSICAL THERAPY LOWER EXTREMITY TREATMENT    Patient Name: Becky Bowers MRN: [id number] DOB:1956-03-18, 67 y.o., female Today's Date: 05/02/2023  END OF SESSION:  PT End of Session - 05/02/23 1011     Visit Number 18    Number of Visits 22    Date for PT Re-Evaluation 05/17/23    Authorization Type [employer]    Authorization Time Period 6 VISITS APPROVED FOR PT08/28/2024-05/17/2023    Authorization - Visit Number 3    Authorization - Number of Visits 6    PT Start Time 1015    PT Stop Time 1100    PT Time Calculation (min) 45 min    Activity Tolerance Patient tolerated treatment well    Behavior During Therapy [name] for tasks assessed/performed                Past Medical History:  Diagnosis Date   Arthritis    Concussion 09/12/2019   Past Surgical History:  Procedure Laterality Date   APPENDECTOMY  1968   DILATION AND CURETTAGE OF UTERUS  1990   TONSILLECTOMY  1980   Patient Active Problem List   Diagnosis Date Noted   Tear of right hamstring 02/03/2023   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   Elevated LDL cholesterol level 04/14/2021   Polyarthralgia 03/17/2021   Primary osteoarthritis right first [facility] and right second MCP 04/24/2018   Numbness of right third and fourth toes 03/19/2018   Primary osteoarthritis of both hips 12/08/2015   Headache 08/12/2010   Lumbar degenerative disc disease 01/07/2008    PCP: Agapito Games, MD  REFERRING PROVIDER: Monica Becton, MD  REFERRING DIAG: [phone] (ICD-10-CM) - Tear of right hamstring  THERAPY DIAG:  Pain in right hip  Stiffness of right hip, not elsewhere classified  Muscle weakness (generalized)  Other abnormalities of gait and mobility  Rationale for Evaluation and Treatment: Rehabilitation  ONSET DATE: Around May 16  SUBJECTIVE:   SUBJECTIVE STATEMENT: Patient reports she did a lot of sitting on trains, states the sitting bothered her more  than walking. Patient states she tried to do her exercises when in hotels; state she did a hike and was glad she had hiking poles. Patient states overall she did well on the trip and did better on her trip than expected. Patient states she wore a thigh compression wrap as needed on her trip.    PERTINENT HISTORY: No history of injuries From eval: Pt states she had an inner ear issue and lost her balance. Pt was standing putting together a rocking chair and slipped/jerked her body and felt something pop in her R posterior hip. Inner ear stuff got cleared up but had continued R posterior hip pain. Pt was walking 3.5 miles/day (has not been walking). Does pilates normally and stopped for now. Pain in general has been decreasing.   PAIN:  Are you having pain? Yes: NPRS scale: 1 currently, at worst 3/10 Pain location: R groin area Pain description: dull ache Aggravating factors: Prolonged sitting, mornings getting out of bed  Relieving factors: Ice  PRECAUTIONS:  From Dr. Melvia Heaps referral notes: Patient is a right biceps femoris tear, minimal retraction, ischiogluteal bursitis, needs to work on passive range of motion for a week, active range of motion for 1 to 2 weeks, and then ECCENTRIC strengthening. Please minimize stretching at least for the first couple of weeks.  WEIGHT BEARING RESTRICTIONS: No  FALLS:  Has patient fallen in last 6 months? No  LIVING ENVIRONMENT: Lives with: lives with their spouse Lives in: House/apartment Stairs: Yes: External: 2 steps; none Has following equipment at home: None  OCCUPATION: Retired  PLOF: Independent  PATIENT GOALS: Return to all normal functions  NEXT MD VISIT: August  OBJECTIVE:   DIAGNOSTIC FINDINGS: MRI 01/21/23 IMPRESSION: 1. Right hamstring tendinosis with a full-thickness minimally retracted tear of the biceps femoris tendon origin with peritendinous edema. 2. Moderate volume fluid within the right ischiogluteal bursa. 3. Mild  tendinosis of the contralateral left hamstring origin. 4. Mild tendinosis of the bilateral gluteus medius and minimus tendons without tear. 5. Mild osteoarthritis of the right hip.  PATIENT SURVEYS:  LEFS 60/80 = 75%   04/04/23 58 / 80 = 72.5 %  EDEMA:  Mild in R buttock  MUSCLE LENGTH: Hamstrings: deferred per precautions Thomas test: WNL  POSTURE:  bilat knees slightly bent  PALPATION: R glute, piriformis tenderness  LOWER EXTREMITY ROM:  Active ROM Right eval Left eval Right 03/09/23 Left 03/09/23  Hip flexion      Hip extension      Hip abduction      Hip adduction      Hip internal rotation      Hip external rotation      Knee flexion 120 135 125 125  Knee extension 0 0 0 0  Ankle dorsiflexion      Ankle plantarflexion      Ankle inversion      Ankle eversion       (Blank rows = not tested)  LOWER EXTREMITY MMT:  MMT Right eval Left eval  Hip flexion 5 5  Hip extension 3- 4  Hip abduction 4- 4  Hip adduction    Hip internal rotation deferred 4  Hip external rotation deferred 3+  Knee flexion deferred 5  Knee extension 5 5  Ankle dorsiflexion    Ankle plantarflexion    Ankle inversion    Ankle eversion     (Blank rows = not tested)  LOWER EXTREMITY SPECIAL TESTS:  Hip special tests: Luisa Hart (FABER) test: positive , Thomas test: negative, and Ely's test: negative  FUNCTIONAL TESTS:  Deferred  GAIT: Mildly antalgic on R with knee remaining slightly flexed   TODAY'S TREATMENT:    [facility] Adult PT Treatment:                                                DATE: 05/02/2023 Therapeutic Exercise: Treadmill warm-up 2.8 - 3.2 mph, 0-8% incline x10 min Squat deadlift 10#KB - 15#KB x 5 each Modified single leg deadlift with orange superband (R) --> L foot against wall x 15 Primal push-up x30" --> added alt shoulder taps x8 Forearm plank + double tap downs x10 Supine bridge with feet on bosu bubble x10 Single leg bridge (R) x10 Bridge R SLS + L foot on  slider: slider out/in, small circles CW/CCW R SLS Divers x10 4" step front & lateral heel tap downs x10    [facility] Adult PT Treatment:                                                DATE: 04/12/2023 Therapeutic Exercise: Treadmill 3.0 mph, 0-10% incline x 8 min Pilates ab series  Primal push-up  Forearm plank --> added knee taps, hip ABD taps, hip ext and hip taps Dynamic side lunges from wide stance, then kneeling ADD stretch sitting back on heel x 30 sec ea (B) lateral slider lunges SLS half moon circles with slider (B) then SLS with clocks in air Modified single leg deadlift with orange superband (R)    [facility] Adult PT Treatment:                                                DATE: 04/06/2023 Therapeutic Exercise: Treadmill 3.0 mph, 0-10% incline x 8 min Pilates ab series Primal push-up  Forearm plank --> added knee taps Dynamic side lunges (add stretch) (R) lateral slider lunges SLS half moon circles with slider (B) Modified single leg deadlift with orange superband (R) Diver + high knee drive up with orange super band (B) Seated HS stretch (B) Bridge + single leg slider in/out (B)    HOME EXERCISE PROGRAM: Access Code: [email or web address] URL: [email or web address] Date: 04/04/2023 Prepared by: Raynelle Fanning  Exercises - Prone Hamstring Curl with Anchored Resistance  - 1 x daily - 7 x weekly - 2 sets - 10 reps - Modified Thomas Stretch  - 1 x daily - 7 x weekly - 2 sets - 30 sec hold - X Band Walk  - 1 x daily - 7 x weekly - 2 sets - 10 reps - Bridge with [employer] on [employer]  - 1 x daily - 7 x weekly - 2 sets - 10 reps - Sidelying Femoral Nerve Glide - Top Leg  - 1 x daily - 7 x weekly - 1 sets - 10 reps - Half Kneeling Hip Flexor Stretch  - 1 x daily - 7 x weekly - 1 sets - 1 min hold - Standing Hamstring Curl with Resistance  - 1 x daily - 7 x weekly - 2 sets - 10 reps - Standing Hip Flexor Stretch  - 1 x daily - 7 x weekly - 2 sets - 30 sec hold - Standing  Hamstring Stretch with Step  - 1 x daily - 7 x weekly - 2 sets - 30 sec hold - Side Lunge Adductor Stretch  - 1 x daily - 7 x weekly - 2 sets - 30 sec hold - Runner's Step Up/Down  - 1 x daily - 7 x weekly - 2 sets - 10 reps - Forward Step Down  - 1 x daily - 7 x weekly - 2 sets - 10 reps - Single Leg Isometric Heel Raise at Wall  - 1 x daily - 7 x weekly - 3 sets - 10 reps - Seated Hip Internal Rotation with Ball and Resistance  - 1 x daily - 7 x weekly - 3 sets - 10 reps - Single-Leg [place] Deadlift With Dumbbell  - 1 x daily - 3-4 x weekly - 1-2 sets - 10 reps - Supine Single Leg Eccentric Hamstring Bridge with Slider  - 1 x daily - 3-4 x weekly - 1-2 sets - 10 reps - Half Deadlift with Kettlebell  - 1 x daily - 3-4 x weekly - 1-2 sets - 10 reps reps  ASSESSMENT:  CLINICAL IMPRESSION: Patient able to perform all exercises with no adverse reactions. Patient challenged with single leg stance exercises; occasional cueing provided to improve postural stability and pelvic alignment.  Will review HEP and modifications/progressions of exercises in preparation for discharge next visit.  OBJECTIVE IMPAIRMENTS: decreased activity tolerance, decreased balance, decreased endurance, decreased mobility, difficulty walking, decreased ROM, decreased strength, increased edema, increased fascial restrictions, impaired flexibility, improper body mechanics, and pain.    GOALS: Goals reviewed with patient? Yes  SHORT TERM GOALS: Target date: 03/07/2023  Pt will be ind with initial HEP Baseline: Goal status: MET  2.  Pt will demo L = R knee AROM to demo improving hamstring mobility Baseline:  Goal status: MET   LONG TERM GOALS: Target date: 05/17/2023  Pt will be ind with management and progression of HEP Baseline:  Goal status: MET  2.  Pt will be able to tolerate single leg stability on R LE x 30 sec Baseline:  Goal status: MET  3.  Pt will be able to walk at least 3 miles without  exacerbation of pain to >/=1/10 Baseline: 1/10 pain with brace  Goal status: MET  4.  Pt will be able to squat and lift at least 10# to demo increased bilat LE strength Baseline: 15# Goal status: MET  5.  Pt will have increased LEFS to >/=70/80 to demo MCID Baseline: 72/80 = 90% Goal status: MET   PLAN:  PT FREQUENCY: 1-2x/week  PT DURATION: 8 weeks  PLANNED INTERVENTIONS: Therapeutic exercises, Therapeutic activity, Neuromuscular re-education, Balance training, Gait training, Patient/Family education, Self Care, Joint mobilization, Aquatic Therapy, Dry Needling, Electrical stimulation, Cryotherapy, Moist heat, Taping, Vasopneumatic device, Ionotophoresis 4mg /ml Dexamethasone, Manual therapy, and Re-evaluation  PLAN FOR NEXT SESSION: Eccentric strengthening. Review pilates equipment routines from instructor. Discharge next visit  Carlynn Herald, PTA 05/02/2023, 11:08 AM

## 2023-05-04 ENCOUNTER — Ambulatory Visit: Payer: Medicare PPO

## 2023-05-04 DIAGNOSIS — R2689 Other abnormalities of gait and mobility: Secondary | ICD-10-CM

## 2023-05-04 DIAGNOSIS — M25551 Pain in right hip: Secondary | ICD-10-CM

## 2023-05-04 DIAGNOSIS — M6281 Muscle weakness (generalized): Secondary | ICD-10-CM

## 2023-05-04 DIAGNOSIS — M25651 Stiffness of right hip, not elsewhere classified: Secondary | ICD-10-CM | POA: Diagnosis not present

## 2023-05-04 NOTE — Therapy (Addendum)
OUTPATIENT PHYSICAL THERAPY LOWER EXTREMITY TREATMENT AND DISCHARGE  PHYSICAL THERAPY DISCHARGE SUMMARY  Visits from Start of Care: 19  Current functional level related to goals / functional outcomes: See below   Remaining deficits: Has met all LTGs as indicated below   Education / Equipment: Continued exercise progressions   Patient agrees to discharge. Patient goals were met. Patient is being discharged due to meeting the stated rehab goals.    Patient Name: Becky Bowers MRN: [id number] DOB:February 05, 1956, 67 y.o., female Today's Date: 05/04/2023  END OF SESSION:  PT End of Session - 05/04/23 1018     Visit Number 19    Number of Visits 22    Date for PT Re-Evaluation 05/17/23    Authorization Type [employer]    Authorization Time Period 6 VISITS APPROVED FOR PT08/28/2024-05/17/2023    Authorization - Visit Number 4    Authorization - Number of Visits 6    Progress Note Due on Visit 25    PT Start Time 1018    PT Stop Time 1107    PT Time Calculation (min) 49 min    Activity Tolerance Patient tolerated treatment well    Behavior During Therapy [facility] for tasks assessed/performed            Past Medical History:  Diagnosis Date   Arthritis    Concussion 09/12/2019   Past Surgical History:  Procedure Laterality Date   APPENDECTOMY  1968   DILATION AND CURETTAGE OF UTERUS  1990   TONSILLECTOMY  1980   Patient Active Problem List   Diagnosis Date Noted   Tear of right hamstring 02/03/2023   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   Elevated LDL cholesterol level 04/14/2021   Polyarthralgia 03/17/2021   Primary osteoarthritis right first [facility] and right second MCP 04/24/2018   Numbness of right third and fourth toes 03/19/2018   Primary osteoarthritis of both hips 12/08/2015   Headache 08/12/2010   Lumbar degenerative disc disease 01/07/2008    PCP: Agapito Games, MD  REFERRING PROVIDER: Monica Becton, MD  REFERRING  DIAG: [phone] (ICD-10-CM) - Tear of right hamstring  THERAPY DIAG:  Pain in right hip  Stiffness of right hip, not elsewhere classified  Muscle weakness (generalized)  Other abnormalities of gait and mobility  Rationale for Evaluation and Treatment: Rehabilitation  ONSET DATE: Around May 16  SUBJECTIVE:   SUBJECTIVE STATEMENT: Patient states she was able to tolerate a walk and exercises without thigh compression brace with no pain.   PERTINENT HISTORY: No history of injuries From eval: Pt states she had an inner ear issue and lost her balance. Pt was standing putting together a rocking chair and slipped/jerked her body and felt something pop in her R posterior hip. Inner ear stuff got cleared up but had continued R posterior hip pain. Pt was walking 3.5 miles/day (has not been walking). Does pilates normally and stopped for now. Pain in general has been decreasing.   PAIN:  Are you having pain? Yes: NPRS scale: 1 currently, at worst 3/10 Pain location: R groin area Pain description: dull ache Aggravating factors: Prolonged sitting, mornings getting out of bed  Relieving factors: Ice  PRECAUTIONS:  From Dr. Melvia Heaps referral notes: Patient is a right biceps femoris tear, minimal retraction, ischiogluteal bursitis, needs to work on passive range of motion for a week, active range of motion for 1 to 2 weeks, and then ECCENTRIC strengthening. Please minimize stretching at least for the first couple of weeks.  WEIGHT BEARING RESTRICTIONS: No  FALLS:  Has patient fallen in last 6 months? No  LIVING ENVIRONMENT: Lives with: lives with their spouse Lives in: House/apartment Stairs: Yes: External: 2 steps; none Has following equipment at home: None  OCCUPATION: Retired  PLOF: Independent  PATIENT GOALS: Return to all normal functions  NEXT MD VISIT: August  OBJECTIVE:   DIAGNOSTIC FINDINGS: MRI 01/21/23 IMPRESSION: 1. Right hamstring tendinosis with a full-thickness  minimally retracted tear of the biceps femoris tendon origin with peritendinous edema. 2. Moderate volume fluid within the right ischiogluteal bursa. 3. Mild tendinosis of the contralateral left hamstring origin. 4. Mild tendinosis of the bilateral gluteus medius and minimus tendons without tear. 5. Mild osteoarthritis of the right hip.  PATIENT SURVEYS:  LEFS 60/80 = 75%   04/04/23 58 / 80 = 72.5 %  EDEMA:  Mild in R buttock  MUSCLE LENGTH: Hamstrings: deferred per precautions Thomas test: WNL  POSTURE:  bilat knees slightly bent  PALPATION: R glute, piriformis tenderness  LOWER EXTREMITY ROM:  Active ROM Right eval Left eval Right 03/09/23 Left 03/09/23  Hip flexion      Hip extension      Hip abduction      Hip adduction      Hip internal rotation      Hip external rotation      Knee flexion 120 135 125 125  Knee extension 0 0 0 0  Ankle dorsiflexion      Ankle plantarflexion      Ankle inversion      Ankle eversion       (Blank rows = not tested)  LOWER EXTREMITY MMT:  MMT Right eval Left eval  Hip flexion 5 5  Hip extension 3- 4  Hip abduction 4- 4  Hip adduction    Hip internal rotation deferred 4  Hip external rotation deferred 3+  Knee flexion deferred 5  Knee extension 5 5  Ankle dorsiflexion    Ankle plantarflexion    Ankle inversion    Ankle eversion     (Blank rows = not tested)  LOWER EXTREMITY SPECIAL TESTS:  Hip special tests: Luisa Hart (FABER) test: positive , Thomas test: negative, and Ely's test: negative  FUNCTIONAL TESTS:  Deferred  GAIT: Mildly antalgic on R with knee remaining slightly flexed   TODAY'S TREATMENT:    [facility] Adult PT Treatment:                                                DATE: 05/04/2023 Therapeutic Exercise: Treadmill warm-up 3.0 mph, incline 0-10% x 8 min Updated HEP review Dynamic HS stretch (scoops) Supine SL HS bridge with slider + opp foot down --> block underneath opp foot to increase Wbing on R LE SL  deadlift + 10#KB Quadruped bent leg hip extension with coregeous ball behind knee --> added YTB at foot    [facility] Adult PT Treatment:                                                DATE: 05/02/2023 Therapeutic Exercise: Treadmill warm-up 2.8 - 3.2 mph, 0-8% incline x10 min Squat deadlift 10#KB - 15#KB x 5 each Modified single leg deadlift with orange superband (R) -->  L foot against wall x 15 Primal push-up x30" --> added alt shoulder taps x8 Forearm plank + double tap downs x10 Supine bridge with feet on bosu bubble x10 Single leg bridge (R) x10 Bridge R SLS + L foot on slider: slider out/in, small circles CW/CCW R SLS Divers x10 4" step front & lateral heel tap downs x10    [facility] Adult PT Treatment:                                                DATE: 04/12/2023 Therapeutic Exercise: Treadmill 3.0 mph, 0-10% incline x 8 min Pilates ab series Primal push-up  Forearm plank --> added knee taps, hip ABD taps, hip ext and hip taps Dynamic side lunges from wide stance, then kneeling ADD stretch sitting back on heel x 30 sec ea (B) lateral slider lunges SLS half moon circles with slider (B) then SLS with clocks in air Modified single leg deadlift with orange superband (R)   HOME EXERCISE PROGRAM: Access Code: [email or web address] URL: [email or web address] Date: 04/04/2023 Prepared by: Raynelle Fanning  Exercises - Prone Hamstring Curl with Anchored Resistance  - 1 x daily - 7 x weekly - 2 sets - 10 reps - Modified Thomas Stretch  - 1 x daily - 7 x weekly - 2 sets - 30 sec hold - X Band Walk  - 1 x daily - 7 x weekly - 2 sets - 10 reps - Bridge with [employer] on [employer]  - 1 x daily - 7 x weekly - 2 sets - 10 reps - Sidelying Femoral Nerve Glide - Top Leg  - 1 x daily - 7 x weekly - 1 sets - 10 reps - Half Kneeling Hip Flexor Stretch  - 1 x daily - 7 x weekly - 1 sets - 1 min hold - Standing Hamstring Curl with Resistance  - 1 x daily - 7 x weekly - 2 sets - 10 reps - Standing Hip  Flexor Stretch  - 1 x daily - 7 x weekly - 2 sets - 30 sec hold - Standing Hamstring Stretch with Step  - 1 x daily - 7 x weekly - 2 sets - 30 sec hold - Side Lunge Adductor Stretch  - 1 x daily - 7 x weekly - 2 sets - 30 sec hold - Runner's Step Up/Down  - 1 x daily - 7 x weekly - 2 sets - 10 reps - Forward Step Down  - 1 x daily - 7 x weekly - 2 sets - 10 reps - Single Leg Isometric Heel Raise at Wall  - 1 x daily - 7 x weekly - 3 sets - 10 reps - Seated Hip Internal Rotation with Ball and Resistance  - 1 x daily - 7 x weekly - 3 sets - 10 reps - Single-Leg [place] Deadlift With Dumbbell  - 1 x daily - 3-4 x weekly - 1-2 sets - 10 reps - Supine Single Leg Eccentric Hamstring Bridge with Slider  - 1 x daily - 3-4 x weekly - 1-2 sets - 10 reps - Half Deadlift with Kettlebell  - 1 x daily - 3-4 x weekly - 1-2 sets - 10 reps reps  ASSESSMENT:  CLINICAL IMPRESSION: HEP updated and reviewed with patient, adding noted for alignment cues and how to progress exercises independently. Patient demonstrated good implementation and  understanding of postural cues and able to complete all exercises with pain or exacerbation of symptoms.   OBJECTIVE IMPAIRMENTS: decreased activity tolerance, decreased balance, decreased endurance, decreased mobility, difficulty walking, decreased ROM, decreased strength, increased edema, increased fascial restrictions, impaired flexibility, improper body mechanics, and pain.    GOALS: Goals reviewed with patient? Yes  SHORT TERM GOALS: Target date: 03/07/2023  Pt will be ind with initial HEP Baseline: Goal status: MET  2.  Pt will demo L = R knee AROM to demo improving hamstring mobility Baseline:  Goal status: MET   LONG TERM GOALS: Target date: 05/17/2023  Pt will be ind with management and progression of HEP Baseline:  Goal status: MET  2.  Pt will be able to tolerate single leg stability on R LE x 30 sec Baseline:  Goal status: MET  3.  Pt will be  able to walk at least 3 miles without exacerbation of pain to >/=1/10 Baseline: 1/10 pain with brace  Goal status: MET  4.  Pt will be able to squat and lift at least 10# to demo increased bilat LE strength Baseline: 15# Goal status: MET  5.  Pt will have increased LEFS to >/=70/80 to demo MCID Baseline: 72/80 = 90% Goal status: MET   PLAN:  PT FREQUENCY: 1-2x/week  PT DURATION: 8 weeks  PLANNED INTERVENTIONS: Therapeutic exercises, Therapeutic activity, Neuromuscular re-education, Balance training, Gait training, Patient/Family education, Self Care, Joint mobilization, Aquatic Therapy, Dry Needling, Electrical stimulation, Cryotherapy, Moist heat, Taping, Vasopneumatic device, Ionotophoresis 4mg /ml Dexamethasone, Manual therapy, and Re-evaluation  PLAN FOR NEXT SESSION: Discharge  Carlynn Herald, PTA 05/04/2023, 11:08 AM  Vernon Prey April Ma L Nonato, PT 05/04/2023, 11:58 AM

## 2023-05-05 ENCOUNTER — Encounter: Payer: Self-pay | Admitting: Sports Medicine

## 2023-05-23 ENCOUNTER — Ambulatory Visit (INDEPENDENT_AMBULATORY_CARE_PROVIDER_SITE_OTHER): Payer: Medicare PPO | Admitting: Family Medicine

## 2023-05-23 ENCOUNTER — Encounter: Payer: Self-pay | Admitting: Family Medicine

## 2023-05-23 VITALS — BP 106/63 | HR 60 | Temp 98.1°F | Resp 18 | Ht 64.0 in | Wt 126.6 lb

## 2023-05-23 DIAGNOSIS — Z0000 Encounter for general adult medical examination without abnormal findings: Secondary | ICD-10-CM

## 2023-05-23 NOTE — Progress Notes (Signed)
Subjective:   LATORIA DRY is a 67 y.o. female who presents for an Initial Medicare Annual Wellness Visit.  Visit Complete: In person  Patient Medicare AWV questionnaire was completed by the patient on 05/23/23; I have confirmed that all information answered by patient is correct and no changes since this date.  Cardiac Risk Factors include: advanced age (>29men, >54 women)     Objective:    Today's Vitals   05/23/23 1416 05/23/23 1423  BP: 106/63   Pulse: 60   Resp: 18   Temp: 98.1 F (36.7 C)   TempSrc: Oral   SpO2: 99%   Weight: 126 lb 9.6 oz (57.4 kg)   Height: 5\' 4"  (1.626 m)   PainSc:  5    Body mass index is 21.73 kg/m.     05/23/2023    2:37 PM 02/07/2023   11:48 AM 09/03/2020    8:40 AM 07/13/2017   10:44 AM 12/10/2015    3:54 PM  Advanced Directives  Does Patient Have a Medical Advance Directive? Yes Yes No Yes Yes  Type of Advance Directive Living will;Healthcare Power of [employer] Power of [place];Living will  Living will   Does patient want to make changes to medical advance directive? No - Patient declined No - Patient declined  No - Patient declined   Copy of Healthcare Power of Attorney in Chart?     Yes  Would patient like information on creating a medical advance directive?   Yes (MAU/Ambulatory/Procedural Areas - Information given)      Current Medications (verified) Outpatient Encounter Medications as of 05/23/2023  Medication Sig   Calcium Carbonate-Vitamin D 600-200 MG-UNIT TABS Take 1 tablet by mouth 2 (two) times daily.   celecoxib (CELEBREX) 200 MG capsule One to 2 tablets by mouth daily as needed for pain.   Estradiol 10 MCG TABS vaginal tablet 1 tablet per vagina daily   Multiple Vitamins-Minerals (ONE-A-DAY WOMENS 50+ ADVANTAGE PO) Take 1 capsule by mouth daily.   triamcinolone ointment (KENALOG) 0.1 % Apply 1 Application topically 2 (two) times daily.   valACYclovir (VALTREX) 1000 MG tablet Take 1 tablet (1,000 mg total) by  mouth as needed.   HYDROcodone-acetaminophen (NORCO/VICODIN) 5-325 MG tablet Take 1 tablet by mouth every 8 (eight) hours as needed for moderate pain. (Patient not taking: Reported on 05/23/2023)   No facility-administered encounter medications on file as of 05/23/2023.    Allergies (verified) Patient has no known allergies.   History: Past Medical History:  Diagnosis Date   Arthritis    Concussion 09/12/2019   Past Surgical History:  Procedure Laterality Date   APPENDECTOMY  1968   DILATION AND CURETTAGE OF UTERUS  1990   TONSILLECTOMY  1980   Family History  Problem Relation Age of Onset   Heart attack Mother    Diabetes Father    Hypertension Father    Squamous cell carcinoma Father        skin    Atrial fibrillation Father    Hearing loss Father    Dementia Father    Diabetes Brother    Hypertension Brother    Colon cancer Paternal Grandmother    Alzheimer's disease Paternal Grandmother    Alzheimer's disease Paternal Uncle    Social History   Socioeconomic History   Marital status: Married    Spouse name: Ramon Dredge.    Number of children: 1   Years of education: Not on file   Highest education level: Bachelor's degree (e.g., BA,  AB, BS)  Occupational History   Occupation: Retired Designer, television/film set: [employer]  Tobacco Use   Smoking status: Never   Smokeless tobacco: Never  Vaping Use   Vaping status: Never Used  Substance and Sexual Activity   Alcohol use: Yes    Alcohol/week: 2.0 - 4.0 standard drinks of alcohol    Types: 2 - 4 Glasses of wine per week    Comment: 4-6 glasses of wine a week   Drug use: No   Sexual activity: Yes    Birth control/protection: Post-menopausal    Comment: retired Runner, 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  Other Topics Concern   Not on file  Social History Narrative   1 Caffeine drink per day. 5 days per week exercising/walk 1 day yoga.  Retired.     Social Determinants of Health   Financial Resource Strain: Low Risk  (05/23/2023)   Overall Financial Resource Strain (CARDIA)    Difficulty of Paying Living Expenses: Not hard at all  Food Insecurity: No Food Insecurity (05/23/2023)   Hunger Vital Sign    Worried About Running Out of Food in the Last Year: Never true    Ran Out of Food in the Last Year: Never true  Transportation Needs: No Transportation Needs (05/23/2023)   PRAPARE - Administrator, Civil Service (Medical): No    Lack of Transportation (Non-Medical): No  Physical Activity: Sufficiently Active (05/23/2023)   Exercise Vital Sign    Days of Exercise per Week: 6 days    Minutes of Exercise per Session: 50 min  Stress: No Stress Concern Present (05/23/2023)   [employer] of Occupational Health - Occupational Stress Questionnaire    Feeling of Stress : Not at all  Social Connections: Moderately Isolated (05/23/2023)   Social Connection and Isolation Panel [NHANES]    Frequency of Communication with Friends and Family: More than three times a week    Frequency of Social Gatherings with Friends and Family: Once a week    Attends Religious Services: Never    Database administrator or Organizations: No    Attends Engineer, structural: Never    Marital Status: Married    Tobacco Counseling Counseling given: Not Answered   Clinical Intake:  Pre-visit preparation completed: No  Pain : 0-10 Pain Score: 5  (under the care for arthritis.) Pain Type: Chronic pain Pain Location: Arm Pain Orientation: Right Pain Descriptors / Indicators: Constant, Dull Pain Onset: More than a month ago Pain Frequency: Intermittent Pain Relieving Factors: get steroid injections Effect of Pain on Daily Activities: no  Pain Relieving Factors: get steroid injections  BMI - recorded: 21.7 Nutritional Status: BMI of 19-24  Normal Nutritional Risks: None Diabetes: No  How often do you need to  have someone help you when you read instructions, pamphlets, or other written materials from your doctor or pharmacy?: 1 - Never What is the last grade level you completed in school?: 12  Interpreter Needed?: No      Activities of Daily Living    05/23/2023    2:26 PM 05/19/2023   11:14 AM  In your present state of health, do you have any difficulty performing the following activities:  Hearing? 0 0  Vision? 0 0  Difficulty concentrating or making decisions? 0 0  Walking or climbing stairs? 0 0  Dressing or bathing? 0 0  Doing errands, shopping? 0 0  Preparing Food and eating ? N N  Using the Toilet? N N  In the past six months, have you accidently leaked urine? N N  Do you have problems with loss of bowel control? N N  Managing your Medications? N N  Managing your Finances? N N  Housekeeping or managing your Housekeeping? N N    Patient Care Team: Agapito Games, MD as PCP - General  Indicate any recent Medical Services you may have received from other than [facility] providers in the past year (date may be approximate).     Assessment:   This is a routine wellness examination for Ramatoulaye.  Hearing/Vision screen Hearing Screening - Comments:: Unable to obtain  Vision Screening - Comments:: Unable to obtain    Goals Addressed             This Visit's Progress    Mobility and Independence Optimized       Evidence-based guidance:  Refer to physical therapy or occupational therapy for assessment and individualized program.  Provide therapy that may include functional task training, balance, active or passive exercise, spasticity management, assistive device training, cardiorespiratory fitness, Pilates and telerehabilitation services.  Identify barriers to participation in therapy or exercise such as pain with activity, anticipated or imagined pain, transportation, depression or fear.  Work with patient and family to develop self-management plan to remove barriers.   Refer to speech/language pathologist to assess and treat speech/language deficit and swallowing impairment.  Periodically review ability to perform activities of daily living and required amount of assistance needed for safety, optimal independence and self-care.  Encourage appropriate vocational or educational counseling for re-entering the community, workplace or school; consider a driving evaluation.  Assist patient to advocate for necessary adaptations to the work or school environment.  Refer to employer for information about FMLA (Family and Medical Leave Act), Short or Long-Term Disability and options for adjustments to work schedule, assignment or hours.   Notes:       Depression Screen    05/23/2023    2:40 PM 05/23/2023    2:36 PM 10/03/2022   10:08 AM 10/03/2022    9:59 AM 09/06/2021    2:18 PM 09/03/2020    8:40 AM 08/13/2019    9:36 AM  PHQ 2/9 Scores  PHQ - 2 Score 0 0 0 0 0 0 0    Fall Risk    05/23/2023    2:38 PM 05/19/2023   11:14 AM 10/03/2022   10:08 AM 10/03/2022    9:59 AM 05/05/2022    1:42 PM  Fall Risk   Falls in the past year? 0 0 0 0 0  Number falls in past yr: 0 0 0 0 0  Injury with Fall? 0 0 0  0  Risk for fall due to : No Fall Risks  No Fall Risks  No Fall Risks  Follow up   Falls evaluation completed  Falls evaluation completed    MEDICARE RISK AT HOME: Medicare Risk at Home Any stairs in or around the home?: Yes If so, are there any without handrails?: No Home free of loose throw rugs in walkways, pet beds, electrical cords, etc?: Yes Adequate lighting in your home to reduce risk of falls?: Yes Life alert?: No Use of a cane, walker or w/c?: No Grab bars in the bathroom?: No Shower chair or bench in shower?: Yes Elevated toilet seat or a handicapped toilet?: No  TIMED UP AND GO:  Was the test performed? Yes  Length of time to ambulate 10 feet: 5 sec Gait steady and fast without use of assistive device    Cognitive Function:         05/23/2023    2:40 PM 05/05/2022    1:45 PM  6CIT Screen  What Year? 0 points 0 points  What month? 0 points 0 points  What time? 0 points 0 points  Count back from 20 0 points 0 points  Months in reverse 0 points 0 points  Repeat phrase 0 points 0 points  Total Score 0 points 0 points    Immunizations Immunization History  Administered Date(s) Administered   Influenza Split 05/08/2012   Influenza, High Dose Seasonal PF 05/09/2022   Influenza,inj,Quad PF,6+ Mos 05/09/2014   Influenza-Unspecified 05/15/2015, 05/24/2016, 05/12/2017, 05/11/2018, 05/15/2019, 05/04/2020, 05/21/2021   Janssen (J&J) SARS-COV-2 Vaccination 10/11/2019   Moderna SARS-COV2 Booster Vaccination 07/13/2021, 05/09/2022   PFIZER Comirnaty(Gray Top)Covid-19 Tri-Sucrose Vaccine 01/12/2021   PFIZER(Purple Top)SARS-COV-2 Vaccination 06/04/2020   PNEUMOCOCCAL CONJUGATE-20 02/03/2022   Rsv, Bivalent, Protein Subunit Rsvpref,pf Verdis Frederickson) 04/19/2022   Td 02/18/2009   Tdap 02/06/2011, 09/03/2020   Unspecified SARS-COV-2 Vaccination 04/05/2023   Zoster Recombinant(Shingrix) 07/13/2017, 10/23/2017    TDAP status: Up to date  Flu Vaccine status: Up to date  Pneumococcal vaccine status: Up to date  Covid-19 vaccine status: Completed vaccines  Qualifies for Shingles Vaccine? Yes   Zostavax completed Yes   Shingrix Completed?: Yes  Screening Tests Health Maintenance  Topic Date Due   INFLUENZA VACCINE  11/06/2023 (Originally 03/09/2023)   COVID-19 Vaccine (7 - 2023-24 season) 05/31/2023   Medicare Annual Wellness (AWV)  05/22/2024   MAMMOGRAM  10/05/2024   Colonoscopy  08/15/2029   DTaP/Tdap/Td (4 - Td or Tdap) 09/03/2030   Pneumonia Vaccine 43+ Years old  Completed   DEXA SCAN  Completed   Hepatitis C Screening  Completed   Zoster Vaccines- Shingrix  Completed   HPV VACCINES  Aged Out    Health Maintenance  There are no preventive care reminders to display for this patient.   Colorectal cancer  screening: Type of screening: Colonoscopy. Completed 08/16/19. Repeat every 10 years.   Mammogram status: Completed 10/05/22. Repeat every year  Bone Density status: Completed 08/17/22. Results reflect: Bone density results: NORMAL. Repeat every 2 years.  Lung Cancer Screening: (Low Dose CT Chest recommended if Age 16-80 years, 20 pack-year currently smoking OR have quit w/in 15years.) does not qualify.   Lung Cancer Screening Referral: not needed  Additional Screening:  Hepatitis C Screening: does not qualify; Completed 07/06/2015  Vision Screening: Recommended annual ophthalmology exams for early detection of glaucoma and other disorders of the eye. Is the patient up to date with their annual eye exam?  Yes  Who is the provider or what is the name of the office in which the patient attends annual eye exams? [facility]  If pt is not established with a provider, would they like to be referred to a provider to establish care? No .   Dental Screening: Recommended annual dental exams for proper oral hygiene  Diabetic Foot Exam: not diabetic   Community Resource Referral / Chronic Care Management: CRR required this visit?  No   CCM required this visit?  No     Plan:     I have personally reviewed and noted the following in the patient's chart:   Medical and social history Use of alcohol, tobacco or illicit drugs  Current medications and supplements including opioid prescriptions. Patient is  not currently taking opioid prescriptions. Functional ability and status Nutritional status Physical activity Advanced directives List of other physicians: Dr. Linford Arnold PCP,  Dr. Kreg Shropshire, sports med Dr. Scarlette Calico, derm Dr. Macon Large, GYN  Hospitalizations, surgeries, and ER visits in previous 12 months: n/a  Vitals Screenings to include cognitive, depression, and falls Referrals and appointments: no referrals needed today.   In addition, I have reviewed and discussed with patient  certain preventive protocols, quality metrics, and best practice recommendations. A written personalized care plan for preventive services as well as general preventive health recommendations were provided to patient.     Novella Olive, FNP   05/23/2023   After Visit Summary: (In Person-Declined) Patient declined AVS at this time.

## 2023-05-31 ENCOUNTER — Other Ambulatory Visit (INDEPENDENT_AMBULATORY_CARE_PROVIDER_SITE_OTHER): Payer: Medicare PPO

## 2023-05-31 ENCOUNTER — Encounter: Payer: Self-pay | Admitting: Sports Medicine

## 2023-05-31 ENCOUNTER — Ambulatory Visit: Payer: Medicare PPO | Admitting: Sports Medicine

## 2023-05-31 DIAGNOSIS — M1811 Unilateral primary osteoarthritis of first carpometacarpal joint, right hand: Secondary | ICD-10-CM

## 2023-05-31 DIAGNOSIS — M7061 Trochanteric bursitis, right hip: Secondary | ICD-10-CM | POA: Diagnosis not present

## 2023-05-31 DIAGNOSIS — S76311A Strain of muscle, fascia and tendon of the posterior muscle group at thigh level, right thigh, initial encounter: Secondary | ICD-10-CM

## 2023-05-31 NOTE — Assessment & Plan Note (Signed)
This pleasant 67 year old female returns, in May she took a misstep and felt a pop right buttock. 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 She has improved dramatically with physical therapy. I have suggested additional eccentric type conditioning. I really think we should avoid injections if at all possible here. We will give her the beginner and advanced hamstring rehab protocols to work on at home for at least the next 8 weeks.

## 2023-05-31 NOTE — Patient Instructions (Addendum)
Hip Rehabilitation Protocol:  1.  Side leg raises.  3x30 with no weight, then 3x15 with 2 lb ankle weight, then 3x15 with 5 lb ankle weight 2.  Standing hip rotation.  3x30 with no weight, then 3x15 with 2 lb ankle weight, then 3x15 with 5 lb ankle weight. 3.  Side step ups.  3x30 with no weight, then 3x15 with 5 lbs in backpack, then 3x15 with 10 lbs in backpack.  Initial Hamstring Rehab Protocol Hamstring curls: Start with 3 sets of 15 (no weight); Progress by 5 reps every 3 days until you reach 3 sets of 30; After 3 days at 3 sets of 30, add 2lb ankle weight at 3 sets of 10; Increase every 5 days by 5 reps. You may add 2lbs ankle weight once weekly. Hamstring swings- swing leg backwards and curl at the end of the swing. Follow same schedule as above. Hamstring running lunges- running lunge position means no more than 45 degrees of knee flexion and running motion. Follow same schedule as above.  Advanced Hamstring Rehab Protocol Plyometrics: bound and land in running position; 3 sets of 15 level first; then up a rise; then down a rise; only advance when level is easy. No greater than 45 deg knee flexion. Stride drills- 10 x 75 yards; bounding but no more than 45 deg knee flexion. Level for 1 week; then add up hill for 1 week; then add down hill. Hop drills- 15 repetitive hops- 3 sets; compare injured to non injured leg Nordic hamstring exercises- stabilize body kneeling;1 set of 5-10 and progress slowly(Don't attempt if painful)

## 2023-05-31 NOTE — Progress Notes (Signed)
    Procedures performed today:    Procedure: Real-time Ultrasound Guided injection of the right first [facility] Device: Samsung HS60  Verbal informed consent obtained.  Time-out conducted.  Noted no overlying erythema, induration, or other signs of local infection.  Skin prepped in a sterile fashion.  Local anesthesia: Topical Ethyl chloride.  With sterile technique and under real time ultrasound guidance: Noted arthritic joint, 0.5 cc lidocaine by 0.5 cc kenalog 40 injected easily.   Completed without difficulty  Advised to call if fevers/chills, erythema, induration, drainage, or persistent bleeding.  Images permanently stored and available for review in PACS.  Impression: Technically successful ultrasound guided injection.  Independent interpretation of notes and tests performed by another provider:   None.  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 She has improved dramatically with physical therapy. I have suggested additional eccentric type conditioning. I really think we should avoid injections if at all possible here. We will give her the beginner and advanced hamstring rehab protocols to work on at home for at least the next 8 weeks.  Greater trochanteric bursitis, right Tenderness right lateral hip over the greater trochanter, reproduction of pain with resisted hip abduction with weakness. We discussed that this represented more of a tendinopathy rather than a true bursitis and that the treatment again was eccentric conditioning, adding some hip abductor  conditioning will revisit this in about 6-8 weeks.    ____________________________________________ Ihor Austin. Benjamin Stain, M.D., ABFM., CAQSM., AME. Primary Care and Sports Medicine  [facility]  Adjunct Professor of Family Medicine  [employer]  Restaurant manager, fast food

## 2023-05-31 NOTE — Assessment & Plan Note (Signed)
Tenderness right lateral hip over the greater trochanter, reproduction of pain with resisted hip abduction with weakness. We discussed that this represented more of a tendinopathy rather than a true bursitis and that the treatment again was eccentric conditioning, adding some hip abductor conditioning will revisit this in about 6-8 weeks.

## 2023-05-31 NOTE — Assessment & Plan Note (Signed)
Increasing pain right first [facility], we injected this last in January, repeat right first [facility] injection with ultrasound guidance, return to see me as needed for

## 2023-06-28 ENCOUNTER — Encounter: Payer: Self-pay | Admitting: Family Medicine

## 2023-06-28 DIAGNOSIS — E611 Iron deficiency: Secondary | ICD-10-CM

## 2023-06-29 DIAGNOSIS — E611 Iron deficiency: Secondary | ICD-10-CM | POA: Diagnosis not present

## 2023-06-29 NOTE — Telephone Encounter (Signed)
Orders Placed This Encounter  Procedures   Fe+TIBC+Fer   CBC with Differential/Platelet

## 2023-06-30 LAB — CBC WITH DIFFERENTIAL/PLATELET
Basophils Absolute: 0 10*3/uL (ref 0.0–0.2)
Basos: 1 %
EOS (ABSOLUTE): 0.1 10*3/uL (ref 0.0–0.4)
Eos: 2 %
Hematocrit: 37.2 % (ref 34.0–46.6)
Hemoglobin: 12 g/dL (ref 11.1–15.9)
Immature Grans (Abs): 0 10*3/uL (ref 0.0–0.1)
Immature Granulocytes: 0 %
Lymphocytes Absolute: 1.7 10*3/uL (ref 0.7–3.1)
Lymphs: 27 %
MCH: 29.6 pg (ref 26.6–33.0)
MCHC: 32.3 g/dL (ref 31.5–35.7)
MCV: 92 fL (ref 79–97)
Monocytes Absolute: 0.6 10*3/uL (ref 0.1–0.9)
Monocytes: 9 %
Neutrophils Absolute: 4 10*3/uL (ref 1.4–7.0)
Neutrophils: 61 %
Platelets: 185 10*3/uL (ref 150–450)
RBC: 4.05 x10E6/uL (ref 3.77–5.28)
RDW: 12.4 % (ref 11.7–15.4)
WBC: 6.5 10*3/uL (ref 3.4–10.8)

## 2023-06-30 LAB — IRON,TIBC AND FERRITIN PANEL
Ferritin: 63 ng/mL (ref 15–150)
Iron Saturation: 23 % (ref 15–55)
Iron: 91 ug/dL (ref 27–139)
Total Iron Binding Capacity: 390 ug/dL (ref 250–450)
UIBC: 299 ug/dL (ref 118–369)

## 2023-06-30 NOTE — Progress Notes (Signed)
Your lab work is within acceptable range and there are no concerning findings.   ?

## 2023-07-12 ENCOUNTER — Encounter: Payer: Self-pay | Admitting: Sports Medicine

## 2023-07-12 ENCOUNTER — Ambulatory Visit: Payer: Medicare PPO | Admitting: Sports Medicine

## 2023-07-12 DIAGNOSIS — M1811 Unilateral primary osteoarthritis of first carpometacarpal joint, right hand: Secondary | ICD-10-CM

## 2023-07-12 NOTE — Assessment & Plan Note (Signed)
Becky Bowers comes in for a follow-up of her first [facility] osteoarthritis, she had an injection in October, she had only minimal relief, her main complaint now is triggering of the right thumb. She does have a palpable flexor tendon nodule, it is not painful, I have advised to continue her home conditioning, and return to see me as needed, if it does become painful we can certainly try an [employer] tendon sheath injection.

## 2023-07-12 NOTE — Progress Notes (Signed)
    Procedures performed today:    None.  Independent interpretation of notes and tests performed by another provider:   None.  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 She does have a palpable flexor tendon nodule, it is not painful, I have advised to continue her home conditioning, and return to see me as needed, if it does become painful we can certainly try an [employer] tendon sheath injection.    ____________________________________________ Ihor Austin. Benjamin Stain, M.D., ABFM., CAQSM., AME. Primary Care and Sports Medicine [place] [facility]  Adjunct Professor of Family Medicine  [employer]  Restaurant manager, fast food

## 2023-08-10 ENCOUNTER — Other Ambulatory Visit: Payer: Self-pay | Admitting: Obstetrics and Gynecology

## 2023-08-11 ENCOUNTER — Other Ambulatory Visit: Payer: Self-pay | Admitting: Family Medicine

## 2023-08-11 DIAGNOSIS — M810 Age-related osteoporosis without current pathological fracture: Secondary | ICD-10-CM

## 2023-08-16 ENCOUNTER — Other Ambulatory Visit: Payer: Self-pay | Admitting: *Deleted

## 2023-08-16 DIAGNOSIS — M19011 Primary osteoarthritis, right shoulder: Secondary | ICD-10-CM

## 2023-08-16 MED ORDER — CELECOXIB 200 MG PO CAPS: ORAL_CAPSULE | ORAL | 2 refills | Status: DC

## 2023-08-17 ENCOUNTER — Ambulatory Visit: Payer: Medicare PPO | Admitting: Sports Medicine

## 2023-08-17 ENCOUNTER — Other Ambulatory Visit (INDEPENDENT_AMBULATORY_CARE_PROVIDER_SITE_OTHER): Payer: Medicare PPO

## 2023-08-17 DIAGNOSIS — M25551 Pain in right hip: Secondary | ICD-10-CM | POA: Insufficient documentation

## 2023-08-17 NOTE — Progress Notes (Signed)
    Procedures performed today:    Procedure: Real-time Ultrasound Guided injection of the right greater trochanteric bursa/hip abductors Device: Samsung HS60  Verbal informed consent obtained.  Time-out conducted.  Noted no overlying erythema, induration, or other signs of local infection.  Skin prepped in a sterile fashion.  Local anesthesia: Topical Ethyl chloride.  With sterile technique and under real time ultrasound guidance: 1 cc Kenalog  40, 2 cc lidocaine , 2 cc bupivacaine injected easily Completed without difficulty  Advised to call if fevers/chills, erythema, induration, drainage, or persistent bleeding.  Images permanently stored and available for review in PACS.  Impression: Technically successful ultrasound guided injection.  Independent interpretation of notes and tests performed by another provider:   None.  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 Recently she is having increasing pain lateral hip worse with abduction. On exam she does have some tenderness to the greater trochanter, she has severe pain to resisted hip abduction with severe weakness. She is having enough pain today we will we will do an injection, she will need some aggressive formal PT with hip abductor strengthening. She understands this will take a long time to get better. Of note there was no pain referable to the hip joint itself.    ____________________________________________ Debby PARAS. Curtis, M.D., ABFM., CAQSM., AME. Primary Care and Sports Medicine [facility]  Adjunct Professor of [facility] Medicine  [employer]  Restaurant Manager, [employer]

## 2023-08-17 NOTE — Assessment & Plan Note (Signed)
 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 Recently she is having increasing pain lateral hip worse with abduction. On exam she does have some tenderness to the greater trochanter, she has severe pain to resisted hip abduction with severe weakness. She is having enough pain today we will we will do an injection, she will need some aggressive formal PT with hip abductor strengthening. She understands this will take a long time to get better. Of note there was no pain referable to the hip joint itself.

## 2023-08-18 DIAGNOSIS — M25551 Pain in right hip: Secondary | ICD-10-CM

## 2023-08-18 MED ORDER — TRIAMCINOLONE ACETONIDE 40 MG/ML IJ SUSP
40.0000 mg | Freq: Once | INTRAMUSCULAR | Status: AC
  Administered 2023-08-18: 40 mg via INTRAMUSCULAR

## 2023-08-18 NOTE — Code Documentation (Signed)
 done

## 2023-08-18 NOTE — Addendum Note (Signed)
 Addended by: Carren Rang A on: 08/18/2023 02:35 PM   Modules accepted: Orders

## 2023-08-20 ENCOUNTER — Encounter: Payer: Self-pay | Admitting: Sports Medicine

## 2023-08-22 ENCOUNTER — Other Ambulatory Visit: Payer: Self-pay | Admitting: Family Medicine

## 2023-08-22 DIAGNOSIS — Z1231 Encounter for screening mammogram for malignant neoplasm of breast: Secondary | ICD-10-CM

## 2023-08-22 NOTE — Therapy (Signed)
 OUTPATIENT PHYSICAL THERAPY LOWER EXTREMITY EVALUATION   Patient Name: Becky Bowers MRN: [id number] DOB:1955-08-25, 68 y.o., female Today's Date: 08/23/2023  END OF SESSION:  PT End of Session - 08/23/23 0756     Visit Number 1    Number of Visits 13    Date for PT Re-Evaluation 10/07/23    Authorization Type [employer]    Progress Note Due on Visit 10    PT Start Time 0800    PT Stop Time 0844    PT Time Calculation (min) 44 min    Activity Tolerance Patient tolerated treatment well    Behavior During Therapy [facility] for tasks assessed/performed             Past Medical History:  Diagnosis Date   Arthritis    Concussion 09/12/2019   Past Surgical History:  Procedure Laterality Date   APPENDECTOMY  1968   DILATION AND CURETTAGE OF UTERUS  1990   TONSILLECTOMY  1980   Patient Active Problem List   Diagnosis Date Noted   Right hip pain 08/17/2023   Greater trochanteric bursitis, right 05/31/2023   Tear of right hamstring 02/03/2023   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   Elevated LDL cholesterol level 04/14/2021   Polyarthralgia 03/17/2021   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, right trigger thumb 04/24/2018   Numbness of right third and fourth toes 03/19/2018   Primary osteoarthritis of both hips 12/08/2015   Headache 08/12/2010   Lumbar degenerative disc disease 01/07/2008    PCP: Cydney Draft, MD   REFERRING PROVIDER: Gean Keels, MD   REFERRING DIAG: M25.551 (ICD-10-CM) - Right hip pain   THERAPY DIAG:  Pain in right hip  Muscle weakness (generalized)  Other abnormalities of gait and mobility  Rationale for Evaluation and Treatment: Rehabilitation  ONSET DATE: May 2024 with recent worsening January 2025   SUBJECTIVE:   SUBJECTIVE STATEMENT: Patient reports she tore her hamstring in May 2024 and wasn't diagnosed until June. She had PT for this injury. The hamstring pain is still there,  but not as bad. She is now having worsening pain along the side/front of hip that started about 2 weeks ago. She reports that it was swollen. The hip was so painful that she couldn't abduct the hip. She increased her walking speed right around the time that this pain began in the front/side of the hip, so is unsure if this was the cause of her new area of pain. She saw Dr. Elva Hamburger and had an injection, which seems to have helped with her pain. Rotational movement or transitioning from sit to stand still bothers the hip. Prior to this pain she was walking daily about 3 miles.   PERTINENT HISTORY: Rt hip injection 08/17/23 PAIN:  Are you having pain? Yes: NPRS scale: none currently; 5/10 at worst Pain location: anterolateral hip Pain description: sharp Aggravating factors: rotational movement, transitioning from sit to stand Relieving factors: injection,rest  PRECAUTIONS: None  RED FLAGS: None   WEIGHT BEARING RESTRICTIONS: No  FALLS:  Has patient fallen in last 6 months? No  LIVING ENVIRONMENT: Lives with: lives with their spouse Lives in: House/apartment Stairs: Yes: Internal: 13 steps; on left going up Has following equipment at home: None  OCCUPATION: retired   PLOF: Independent  PATIENT GOALS: "I would love to have no pain and resume back to normal; make it more manageable."   NEXT MD VISIT: 09/28/23  OBJECTIVE:  Note: Objective measures were completed  at Evaluation unless otherwise noted.  DIAGNOSTIC FINDINGS:  Rt Hip MRI: IMPRESSION: 1. Right hamstring tendinosis with a full-thickness minimally retracted tear of the biceps femoris tendon origin with peritendinous edema. 2. Moderate volume fluid within the right ischiogluteal bursa. 3. Mild tendinosis of the contralateral left hamstring origin. 4. Mild tendinosis of the bilateral gluteus medius and minimus tendons without tear. 5. Mild osteoarthritis of the right hip.  PATIENT SURVEYS:  FOTO 56% function; 64% predicted    COGNITION: Overall cognitive status: Within functional limits for tasks assessed     SENSATION: Not tested  EDEMA:  Not inspected   MUSCLE LENGTH: Not tested   POSTURE: No Significant postural limitations  PALPATION: TTP Rt greater trochanter, lateral gluteals  LOWER EXTREMITY ROM:  Active ROM Right eval Left eval  Hip flexion Full    Hip extension    Hip abduction Full pain    Hip adduction    Hip internal rotation 30 pain    Hip external rotation 28   Knee flexion    Knee extension    Ankle dorsiflexion    Ankle plantarflexion    Ankle inversion    Ankle eversion     (Blank rows = not tested)  LOWER EXTREMITY MMT:  MMT Right eval Left eval  Hip flexion 4+ 4  Hip extension 4 5  Hip abduction 3- pain anterior hip 5  Hip adduction 5   Hip internal rotation 4+ "twinge" 4  Hip external rotation 5 5  Knee flexion 5 5  Knee extension 5 5  Ankle dorsiflexion    Ankle plantarflexion    Ankle inversion    Ankle eversion     (Blank rows = not tested)  LOWER EXTREMITY SPECIAL TESTS:  Not tested   FUNCTIONAL TESTS:  Not tested   GAIT: Distance walked: 20 ft  Assistive device utilized: None Level of assistance: Complete Independence Comments: Trendelenburg Rt                                                                                                                                [facility] Adult PT Treatment:                                                DATE: 08/22/23 Therapeutic Exercise: Demonstrated and issued initial HEP.    Self Care: Ice for pain control     PATIENT EDUCATION:  Education details: see treatment; POC; assessment findings  Person educated: Patient Education method: Explanation, Demonstration, Tactile cues, Verbal cues, and Handouts Education comprehension: verbalized understanding, returned demonstration, verbal cues required, tactile cues required, and needs further education  HOME EXERCISE PROGRAM: Access Code:  [email or web address] URL: [email or web address] Date: 08/23/2023 Prepared by: Forrestine Ike  Exercises - Supine Bridge  - 1 x daily - 7 x weekly - 2  sets - 10 reps - Supine March with Posterior Pelvic Tilt  - 1 x daily - 7 x weekly - 2 sets - 10 reps - Supine Hip Adduction Isometric with Ball  - 1 x daily - 7 x weekly - 2 sets - 10 reps - Hooklying Isometric Hip Abduction with Belt  - 1 x daily - 7 x weekly - 2 sets - 10 reps - Modified Thomas Stretch  - 1 x daily - 7 x weekly - 3 sets - 30 sec  hold  ASSESSMENT:  CLINICAL IMPRESSION: Patient is a 68 y.o. female who was seen today for physical therapy evaluation and treatment for acute on  chronic Rt hip pain. She reports worsening of anterolateral hip pain that began about 2 weeks ago when she increased her walking speed during her typical daily walking routine. Upon assessment she is noted to have normal ROM about the hip reporting pain with active abduction and IR. She has significant hip abductor weakness with trendelenburg gait noted on the RLE. She will benefit from skilled PT to address the above stated deficits in order to optimize her function and assist with overall pain reduction.   OBJECTIVE IMPAIRMENTS: Abnormal gait, decreased activity tolerance, decreased balance, decreased knowledge of condition, difficulty walking, decreased strength, improper body mechanics, and pain.   ACTIVITY LIMITATIONS: carrying, lifting, bending, sitting, stairs, transfers, and locomotion level  PARTICIPATION LIMITATIONS: meal prep, cleaning, laundry, shopping, community activity, yard work, and recreation   PERSONAL FACTORS: Age, Time since onset of injury/illness/exacerbation, and 1 comorbidity: osteoarthritis  are also affecting patient's functional outcome.   REHAB POTENTIAL: Good  CLINICAL DECISION MAKING: Stable/uncomplicated  EVALUATION COMPLEXITY: Low   GOALS: Goals reviewed with patient? Yes  SHORT TERM GOALS: Target date:  09/13/2023   Patient will be independent and compliant with initial HEP.   Baseline: issued at eval.  Goal status: INITIAL  2.  Patient will demonstrate pain free Rt hip AROM to improve ability to complete reaching and bending activity.  Baseline: pain with IR and abduction AROM Goal status: INITIAL  3.  Patient will be able to maintain SLS on the RLE with no pelvic drop to improve gait stability.  Baseline: Trendelenburg  Goal status: INITIAL    LONG TERM GOALS: Target date: 10/07/23  Patient will score at least 64% on FOTO to signify clinically meaningful improvement in functional abilities.   Baseline: see above  Goal status: INITIAL  2.  Patient will demonstrate at least 4-/5 Rt hip abductor strength to improve stability with walking.  Baseline: see above Goal status: INITIAL  3.  Patient will report pain at worst rated as </=2/10 to reduce current functional limitations.   Baseline: 5 Goal status: INITIAL     PLAN:  PT FREQUENCY: 2x/week  PT DURATION: 6 weeks  PLANNED INTERVENTIONS: 97164- PT Re-evaluation, 97110-Therapeutic exercises, 97530- Therapeutic activity, [id number]- Neuromuscular re-education, 97535- Self Care, [id number]- Manual therapy, [id number]- Gait training, [id number]- Aquatic Therapy, 97014- Electrical stimulation (unattended), [id number]- Electrical stimulation (manual), [id number]- Ionotophoresis 4mg /ml Dexamethasone , Dry Needling, Cryotherapy, and Moist heat  PLAN FOR NEXT SESSION: review and progress HEP prn; progress glute med strength as tolerated. Hip strengthening.    Ashtian Villacis, PT, DPT, ATC 08/23/23 3:47 PM  Referring diagnosis? Right hip pain  Treatment diagnosis? (if different than referring diagnosis)   Pain in right hip  Muscle weakness (generalized)  Other abnormalities of gait and mobility  What was this (referring dx) caused by? []  Surgery []  Fall [x]  Ongoing issue []   Arthritis []  Other: ____________  Laterality: [x]  Rt []  Lt []   Both  Check all possible CPT codes:  *CHOOSE 10 OR LESS*    See Planned Interventions listed in the Plan section of the Evaluation.

## 2023-08-23 ENCOUNTER — Other Ambulatory Visit: Payer: Self-pay

## 2023-08-23 ENCOUNTER — Ambulatory Visit: Payer: Medicare PPO | Attending: Sports Medicine

## 2023-08-23 DIAGNOSIS — R2689 Other abnormalities of gait and mobility: Secondary | ICD-10-CM | POA: Diagnosis not present

## 2023-08-23 DIAGNOSIS — M25551 Pain in right hip: Secondary | ICD-10-CM | POA: Diagnosis not present

## 2023-08-23 DIAGNOSIS — M6281 Muscle weakness (generalized): Secondary | ICD-10-CM | POA: Diagnosis not present

## 2023-08-31 ENCOUNTER — Ambulatory Visit: Payer: Medicare PPO

## 2023-08-31 DIAGNOSIS — M25651 Stiffness of right hip, not elsewhere classified: Secondary | ICD-10-CM

## 2023-08-31 DIAGNOSIS — R2689 Other abnormalities of gait and mobility: Secondary | ICD-10-CM | POA: Diagnosis not present

## 2023-08-31 DIAGNOSIS — M25551 Pain in right hip: Secondary | ICD-10-CM | POA: Diagnosis not present

## 2023-08-31 DIAGNOSIS — M6281 Muscle weakness (generalized): Secondary | ICD-10-CM

## 2023-08-31 NOTE — Therapy (Signed)
OUTPATIENT PHYSICAL THERAPY LOWER EXTREMITY TREATMENT   Patient Name: Becky Bowers MRN: [id number] DOB:[birth year], 68 y.o., female Today's Date: 08/31/2023  END OF SESSION:  PT End of Session - 08/31/23 1147     Visit Number 2    Number of Visits 13    Date for PT Re-Evaluation 10/07/23    Authorization Type [employer]    Authorization Time Period 12 VISITS APPROVED FOR PT 08/23/2023-10/07/2023    Authorization - Visit Number 2    Authorization - Number of Visits 12    Progress Note Due on Visit 10    PT Start Time 1148    PT Stop Time 1230    PT Time Calculation (min) 42 min    Activity Tolerance Patient tolerated treatment well    Behavior During Therapy [facility] for tasks assessed/performed            Past Medical History:  Diagnosis Date   Arthritis    Concussion 09/12/2019   Past Surgical History:  Procedure Laterality Date   APPENDECTOMY  1968   DILATION AND CURETTAGE OF UTERUS  1990   TONSILLECTOMY  1980   Patient Active Problem List   Diagnosis Date Noted   Right hip pain 08/17/2023   Greater trochanteric bursitis, right 05/31/2023   Tear of right hamstring 02/03/2023   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   Elevated LDL cholesterol level 04/14/2021   Polyarthralgia 03/17/2021   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, right trigger thumb 04/24/2018   Numbness of right third and fourth toes 03/19/2018   Primary osteoarthritis of both hips 12/08/2015   Headache 08/12/2010   Lumbar degenerative disc disease 01/07/2008    PCP: Agapito Games, MD   REFERRING PROVIDER: Monica Becton, MD   REFERRING DIAG: M25.551 (ICD-10-CM) - Right hip pain   THERAPY DIAG:  Pain in right hip  Muscle weakness (generalized)  Other abnormalities of gait and mobility  Stiffness of right hip, not elsewhere classified  Rationale for Evaluation and Treatment: Rehabilitation  ONSET DATE: May 2024 with recent worsening  January 2025   SUBJECTIVE:   SUBJECTIVE STATEMENT: Patient reports she still has some swelling in R hip but it has gone down since eval. Patient gets sharp pain that shoots down to knee with quick turns - she is uncertain to which direction it is most painful.   EVAL: Patient reports she tore her hamstring in May 2024 and wasn't diagnosed until June. She had PT for this injury. The hamstring pain is still there, but not as bad. She is now having worsening pain along the side/front of hip that started about 2 weeks ago. She reports that it was swollen. The hip was so painful that she couldn't abduct the hip. She increased her walking speed right around the time that this pain began in the front/side of the hip, so is unsure if this was the cause of her new area of pain. She saw Dr. Karie Schwalbe and had an injection, which seems to have helped with her pain. Rotational movement or transitioning from sit to stand still bothers the hip. Prior to this pain she was walking daily about 3 miles.   PERTINENT HISTORY: Rt hip injection 08/17/23 PAIN:  Are you having pain? Yes: NPRS scale: none currently; 5/10 at worst Pain location: anterolateral hip Pain description: sharp Aggravating factors: rotational movement, transitioning from sit to stand Relieving factors: injection,rest  PRECAUTIONS: None  RED FLAGS: None   WEIGHT BEARING RESTRICTIONS:  No  FALLS:  Has patient fallen in last 6 months? No  LIVING ENVIRONMENT: Lives with: lives with their spouse Lives in: House/apartment Stairs: Yes: Internal: 13 steps; on left going up Has following equipment at home: None  OCCUPATION: retired   PLOF: Independent  PATIENT GOALS: "I would love to have no pain and resume back to normal; make it more manageable."   NEXT MD VISIT: 09/28/23  OBJECTIVE:  Note: Objective measures were completed at Evaluation unless otherwise noted.  DIAGNOSTIC FINDINGS:  Rt Hip MRI: IMPRESSION: 1. Right hamstring tendinosis  with a full-thickness minimally retracted tear of the biceps femoris tendon origin with peritendinous edema. 2. Moderate volume fluid within the right ischiogluteal bursa. 3. Mild tendinosis of the contralateral left hamstring origin. 4. Mild tendinosis of the bilateral gluteus medius and minimus tendons without tear. 5. Mild osteoarthritis of the right hip.  PATIENT SURVEYS:  FOTO 56% function; 64% predicted   COGNITION: Overall cognitive status: Within functional limits for tasks assessed     SENSATION: Not tested  EDEMA:  Not inspected   MUSCLE LENGTH: Not tested   POSTURE: No Significant postural limitations  PALPATION: TTP Rt greater trochanter, lateral gluteals  LOWER EXTREMITY ROM:  Active ROM Right eval Left eval  Hip flexion Full    Hip extension    Hip abduction Full pain    Hip adduction    Hip internal rotation 30 pain    Hip external rotation 28   Knee flexion    Knee extension    Ankle dorsiflexion    Ankle plantarflexion    Ankle inversion    Ankle eversion     (Blank rows = not tested)  LOWER EXTREMITY MMT:  MMT Right eval Left eval  Hip flexion 4+ 4  Hip extension 4 5  Hip abduction 3- pain anterior hip 5  Hip adduction 5   Hip internal rotation 4+ "twinge" 4  Hip external rotation 5 5  Knee flexion 5 5  Knee extension 5 5  Ankle dorsiflexion    Ankle plantarflexion    Ankle inversion    Ankle eversion     (Blank rows = not tested)  LOWER EXTREMITY SPECIAL TESTS:  Not tested   FUNCTIONAL TESTS:  Not tested   GAIT: Distance walked: 20 ft  Assistive device utilized: None Level of assistance: Complete Independence Comments: Trendelenburg Rt   [facility] Adult PT Treatment:                                                DATE: 08/31/2023 Therapeutic Exercise: Hooklying hip abd isometric with gait belt  Half-long sitting --> adductor/medial hamstring gentle stretch Repeated hip abd isometric (less pain) Hooklying hip add  isometric with yoga block Bridges + yoga block b/w knees --> elevated pelvis on 1/2 foam roller TFL self-massage with 4" ball Foam rolling R front hip (TFL/hip flexors) Heel raises + 4" ball b/w ankles  Self Care: Foam roll TFL  [facility] Adult PT Treatment:                                                DATE: 08/22/23 Therapeutic Exercise: Demonstrated and issued initial HEP.    Self Care: Ice for pain control     PATIENT EDUCATION:  Education details: see treatment; POC; assessment findings  Person educated: Patient Education method: Explanation, Demonstration, Tactile cues, Verbal cues, and Handouts Education comprehension: verbalized understanding, returned demonstration, verbal cues required, tactile cues required, and needs further education  HOME EXERCISE PROGRAM: Access Code: [email or web address] URL: [email or web address] Date: 08/23/2023 Prepared by: Letitia Libra  Exercises - Supine Bridge  - 1 x daily - 7 x weekly - 2 sets - 10 reps - Supine March with Posterior Pelvic Tilt  - 1 x daily - 7 x weekly - 2 sets - 10 reps - Supine Hip Adduction Isometric with Ball  - 1 x daily - 7 x weekly - 2 sets - 10 reps - Hooklying Isometric Hip Abduction with Belt  - 1 x daily - 7 x weekly - 2 sets - 10 reps - Modified Thomas Stretch  - 1 x daily - 7 x weekly - 3 sets - 30 sec  hold  ASSESSMENT:  CLINICAL IMPRESSION: Patient presented today with less hip abd pain, being able to abduct leg in standing without pain. Tenderness with palpitation along R hip flexors/TFL; patient instructed in foam roller to address tension/tightness at anterior hip. Gentle stretching of R adductors/medial hamstring alleviated mild pain/discomfort during bridges. Patient able to tolerate all exercises with no exacerbation of pain in anterior hip.  EVAL: Patient is a 68 y.o.  female who was seen today for physical therapy evaluation and treatment for acute on  chronic Rt hip pain. She reports worsening of anterolateral hip pain that began about 2 weeks ago when she increased her walking speed during her typical daily walking routine. Upon assessment she is noted to have normal ROM about the hip reporting pain with active abduction and IR. She has significant hip abductor weakness with trendelenburg gait noted on the RLE. She will benefit from skilled PT to address the above stated deficits in order to optimize her function and assist with overall pain reduction.   OBJECTIVE IMPAIRMENTS: Abnormal gait, decreased activity tolerance, decreased balance, decreased knowledge of condition, difficulty walking, decreased strength, improper body mechanics, and pain.   ACTIVITY LIMITATIONS: carrying, lifting, bending, sitting, stairs, transfers, and locomotion level  PARTICIPATION LIMITATIONS: meal prep, cleaning, laundry, shopping, community activity, yard work, and recreation   PERSONAL FACTORS: Age, Time since onset of injury/illness/exacerbation, and 1 comorbidity: osteoarthritis  are also affecting patient's functional outcome.   REHAB POTENTIAL: Good  CLINICAL DECISION MAKING: Stable/uncomplicated  EVALUATION COMPLEXITY: Low   GOALS: Goals reviewed with patient? Yes  SHORT TERM GOALS: Target date: 09/13/2023   Patient will be independent and compliant with initial HEP.  Baseline: issued at eval.  Goal status: INITIAL  2.  Patient will demonstrate pain free Rt hip AROM to improve ability to complete reaching and bending activity.  Baseline: pain with IR and abduction AROM Goal status: INITIAL  3.  Patient will be able to maintain SLS on the RLE with no pelvic drop to improve gait stability.  Baseline: Trendelenburg  Goal status: INITIAL    LONG TERM GOALS: Target date: 10/07/23  Patient will  score at least 64% on FOTO to signify clinically meaningful  improvement in functional abilities.  Baseline: see above  Goal status: INITIAL  2.  Patient will demonstrate at least 4-/5 Rt hip abductor strength to improve stability with walking.  Baseline: see above Goal status: INITIAL  3.  Patient will report pain at worst rated as </=2/10 to reduce current functional limitations.   Baseline: 5 Goal status: INITIAL   PLAN:  PT FREQUENCY: 2x/week  PT DURATION: 6 weeks  PLANNED INTERVENTIONS: 97164- PT Re-evaluation, 97110-Therapeutic exercises, 97530- Therapeutic activity, [id number]- Neuromuscular re-education, 97535- Self Care, [id number]- Manual therapy, [id number]- Gait training, [id number]- Aquatic Therapy, 97014- Electrical stimulation (unattended), [id number]- Electrical stimulation (manual), [id number]- Ionotophoresis 4mg /ml Dexamethasone, Dry Needling, Cryotherapy, and Moist heat  PLAN FOR NEXT SESSION: Try toe corrector next visit. Review and progress HEP prn; progress glute med strength as tolerated. Hip strengthening.    Carlynn Herald, PTA 08/31/23 12:44 PM

## 2023-09-04 ENCOUNTER — Ambulatory Visit: Payer: Medicare PPO

## 2023-09-04 DIAGNOSIS — M25551 Pain in right hip: Secondary | ICD-10-CM | POA: Diagnosis not present

## 2023-09-04 DIAGNOSIS — M6281 Muscle weakness (generalized): Secondary | ICD-10-CM | POA: Diagnosis not present

## 2023-09-04 DIAGNOSIS — M25651 Stiffness of right hip, not elsewhere classified: Secondary | ICD-10-CM

## 2023-09-04 DIAGNOSIS — R2689 Other abnormalities of gait and mobility: Secondary | ICD-10-CM | POA: Diagnosis not present

## 2023-09-04 NOTE — Therapy (Signed)
OUTPATIENT PHYSICAL THERAPY LOWER EXTREMITY TREATMENT   Patient Name: Becky Bowers MRN: [id number] DOB:[birth year], 68 y.o., female Today's Date: 09/04/2023  END OF SESSION:  PT End of Session - 09/04/23 0850     Visit Number 3    Number of Visits 13    Date for PT Re-Evaluation 10/07/23    Authorization Type [employer]    Authorization Time Period 12 VISITS APPROVED FOR PT 08/23/2023-10/07/2023    Authorization - Visit Number 3    Authorization - Number of Visits 12    Progress Note Due on Visit 10    PT Start Time 0850    PT Stop Time 0935    PT Time Calculation (min) 45 min    Activity Tolerance Patient tolerated treatment well    Behavior During Therapy [facility] for tasks assessed/performed            Past Medical History:  Diagnosis Date   Arthritis    Concussion 09/12/2019   Past Surgical History:  Procedure Laterality Date   APPENDECTOMY  1968   DILATION AND CURETTAGE OF UTERUS  1990   TONSILLECTOMY  1980   Patient Active Problem List   Diagnosis Date Noted   Right hip pain 08/17/2023   Greater trochanteric bursitis, right 05/31/2023   Tear of right hamstring 02/03/2023   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   Elevated LDL cholesterol level 04/14/2021   Polyarthralgia 03/17/2021   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, right trigger thumb 04/24/2018   Numbness of right third and fourth toes 03/19/2018   Primary osteoarthritis of both hips 12/08/2015   Headache 08/12/2010   Lumbar degenerative disc disease 01/07/2008    PCP: Agapito Games, MD   REFERRING PROVIDER: Monica Becton, MD   REFERRING DIAG: M25.551 (ICD-10-CM) - Right hip pain   THERAPY DIAG:  Pain in right hip  Other abnormalities of gait and mobility  Muscle weakness (generalized)  Stiffness of right hip, not elsewhere classified  Rationale for Evaluation and Treatment: Rehabilitation  ONSET DATE: May 2024 with recent worsening  January 2025   SUBJECTIVE:   SUBJECTIVE STATEMENT: Patient reports swelling in front hip is getting better, states she has been using roller on front of thigh and noted some tender points in quads. Patient requests an update on HEP to bring to pilates instructor as a guide for her.   EVAL: Patient reports she tore her hamstring in May 2024 and wasn't diagnosed until June. She had PT for this injury. The hamstring pain is still there, but not as bad. She is now having worsening pain along the side/front of hip that started about 2 weeks ago. She reports that it was swollen. The hip was so painful that she couldn't abduct the hip. She increased her walking speed right around the time that this pain began in the front/side of the hip, so is unsure if this was the cause of her new area of pain. She saw Dr. Karie Schwalbe and had an injection, which seems to have helped with her pain. Rotational movement or transitioning from sit to stand still bothers the hip. Prior to this pain she was walking daily about 3 miles.   PERTINENT HISTORY: Rt hip injection 08/17/23 PAIN:  Are you having pain? Yes: NPRS scale: none currently; 5/10 at worst Pain location: anterolateral hip Pain description: sharp Aggravating factors: rotational movement, transitioning from sit to stand Relieving factors: injection,rest  PRECAUTIONS: None  RED FLAGS: None   WEIGHT  BEARING RESTRICTIONS: No  FALLS:  Has patient fallen in last 6 months? No  LIVING ENVIRONMENT: Lives with: lives with their spouse Lives in: House/apartment Stairs: Yes: Internal: 13 steps; on left going up Has following equipment at home: None  OCCUPATION: retired   PLOF: Independent  PATIENT GOALS: "I would love to have no pain and resume back to normal; make it more manageable."   NEXT MD VISIT: 09/28/23  OBJECTIVE:  Note: Objective measures were completed at Evaluation unless otherwise noted.  DIAGNOSTIC FINDINGS:  Rt Hip MRI: IMPRESSION: 1. Right  hamstring tendinosis with a full-thickness minimally retracted tear of the biceps femoris tendon origin with peritendinous edema. 2. Moderate volume fluid within the right ischiogluteal bursa. 3. Mild tendinosis of the contralateral left hamstring origin. 4. Mild tendinosis of the bilateral gluteus medius and minimus tendons without tear. 5. Mild osteoarthritis of the right hip.  PATIENT SURVEYS:  FOTO 56% function; 64% predicted   COGNITION: Overall cognitive status: Within functional limits for tasks assessed     SENSATION: Not tested  EDEMA:  Not inspected   MUSCLE LENGTH: Not tested   POSTURE: No Significant postural limitations  PALPATION: TTP Rt greater trochanter, lateral gluteals  LOWER EXTREMITY ROM:  Active ROM Right eval Left eval  Hip flexion Full    Hip extension    Hip abduction Full pain    Hip adduction    Hip internal rotation 30 pain    Hip external rotation 28   Knee flexion    Knee extension    Ankle dorsiflexion    Ankle plantarflexion    Ankle inversion    Ankle eversion     (Blank rows = not tested)  LOWER EXTREMITY MMT:  MMT Right eval Left eval  Hip flexion 4+ 4  Hip extension 4 5  Hip abduction 3- pain anterior hip 5  Hip adduction 5   Hip internal rotation 4+ "twinge" 4  Hip external rotation 5 5  Knee flexion 5 5  Knee extension 5 5  Ankle dorsiflexion    Ankle plantarflexion    Ankle inversion    Ankle eversion     (Blank rows = not tested)  LOWER EXTREMITY SPECIAL TESTS:  Not tested   FUNCTIONAL TESTS:  Not tested   GAIT: Distance walked: 20 ft  Assistive device utilized: None Level of assistance: Complete Independence Comments: Trendelenburg Rt   [facility] Adult PT Treatment:                                                DATE: 10/05/2023 Therapeutic Exercise: Quad/hip flexor stretch standing at railing with back foot on chair Standing quad stretch Pilates toe corrector exercises(HEP notes) Bridges + yoga  block b/w knees --> pelvis placed on yoga block Supine hip/knee extension with black super band (R)   [facility] Adult PT Treatment:                                                DATE: 08/31/2023 Therapeutic Exercise: Hooklying hip abd isometric with gait belt  Half-long sitting --> adductor/medial hamstring gentle stretch Repeated hip abd isometric (less pain) Hooklying hip add isometric with yoga block Bridges + yoga block b/w knees --> elevated pelvis  on 1/2 foam roller TFL self-massage with 4" ball Foam rolling R front hip (TFL/hip flexors) Heel raises + 4" ball b/w ankles  Self Care: Foam roll TFL                                                                                                                               [facility] Adult PT Treatment:                                                DATE: 08/22/23 Therapeutic Exercise: Demonstrated and issued initial HEP.  Self Care: Ice for pain control     PATIENT EDUCATION:  Education details: see treatment; POC; assessment findings  Person educated: Patient Education method: Explanation, Demonstration, Tactile cues, Verbal cues, and Handouts Education comprehension: verbalized understanding, returned demonstration, verbal cues required, tactile cues required, and needs further education  HOME EXERCISE PROGRAM: Access Code: [email or web address] URL: [email or web address] Date: 09/04/2023 Prepared by: Carlynn Herald  Program Notes Pilates toe corrector: *lengthen through inner leg* 1. parallel pull apart 2. curling toes --> arch --> ankles; reverse 3. fan leg open (ER) --> back to parallel 4. 1/2 small circles: fan open, roll over toes/arch/ankle, back to parallel --> reverse  Exercises - Supine Bridge  - 1 x daily - 7 x weekly - 2 sets - 10 reps - Supine March with Posterior Pelvic Tilt  - 1 x daily - 7 x weekly - 2 sets - 10 reps - Supine Hip Adduction Isometric with Ball  - 1 x daily - 7 x weekly - 2 sets - 10 reps -  Hooklying Isometric Hip Abduction with Belt  - 1 x daily - 7 x weekly - 2 sets - 10 reps - Modified Thomas Stretch  - 1 x daily - 7 x weekly - 3 sets - 30 sec  hold - Standing Quadriceps Stretch  - 1 x daily - 7 x weekly - 3 sets - 10 reps - Standing Lobbyist in [place] Split Squat Position  - 1 x daily - 7 x weekly - 3 sets - 10 reps - Supine Bridge with Mini Swiss Ball Between Knees  - 1 x daily - 7 x weekly - 3 sets - 10 reps - Supine Knee and Hip Extension with Resistance  - 1 x daily - 7 x weekly - 3 sets - 10 reps  ASSESSMENT:  CLINICAL IMPRESSION: Glute and hip strengthening exercises continued with pilates toe corrector exercises, using equipment patient brought from home. Tactile and verbal cueing provided as needed to improve hip stabilization and inner leg lengthening for increased proximal adductor activation. HEP printed with notes for pilates specific exercises; discussed patient's return to pilates and providing PT HEP for instructor for safe exercise guidelines.   EVAL: Patient is a 68 y.o. female who was  seen today for physical therapy evaluation and treatment for acute on  chronic Rt hip pain. She reports worsening of anterolateral hip pain that began about 2 weeks ago when she increased her walking speed during her typical daily walking routine. Upon assessment she is noted to have normal ROM about the hip reporting pain with active abduction and IR. She has significant hip abductor weakness with trendelenburg gait noted on the RLE. She will benefit from skilled PT to address the above stated deficits in order to optimize her function and assist with overall pain reduction.   OBJECTIVE IMPAIRMENTS: Abnormal gait, decreased activity tolerance, decreased balance, decreased knowledge of condition, difficulty walking, decreased strength, improper body mechanics, and pain.   ACTIVITY LIMITATIONS: carrying, lifting, bending, sitting, stairs, transfers, and locomotion  level  PARTICIPATION LIMITATIONS: meal prep, cleaning, laundry, shopping, community activity, yard work, and recreation   PERSONAL FACTORS: Age, Time since onset of injury/illness/exacerbation, and 1 comorbidity: osteoarthritis  are also affecting patient's functional outcome.   REHAB POTENTIAL: Good  CLINICAL DECISION MAKING: Stable/uncomplicated  EVALUATION COMPLEXITY: Low   GOALS: Goals reviewed with patient? Yes  SHORT TERM GOALS: Target date: 09/13/2023   Patient will be independent and compliant with initial HEP.  Baseline: issued at eval.  Goal status: INITIAL  2.  Patient will demonstrate pain free Rt hip AROM to improve ability to complete reaching and bending activity.  Baseline: pain with IR and abduction AROM Goal status: INITIAL  3.  Patient will be able to maintain SLS on the RLE with no pelvic drop to improve gait stability.  Baseline: Trendelenburg  Goal status: INITIAL    LONG TERM GOALS: Target date: 10/07/23  Patient will score at least 64% on FOTO to signify clinically meaningful improvement in functional abilities.  Baseline: see above  Goal status: INITIAL  2.  Patient will demonstrate at least 4-/5 Rt hip abductor strength to improve stability with walking.  Baseline: see above Goal status: INITIAL  3.  Patient will report pain at worst rated as </=2/10 to reduce current functional limitations.   Baseline: 5 Goal status: INITIAL   PLAN:  PT FREQUENCY: 2x/week  PT DURATION: 6 weeks  PLANNED INTERVENTIONS: 97164- PT Re-evaluation, 97110-Therapeutic exercises, 97530- Therapeutic activity, [id number]- Neuromuscular re-education, 97535- Self Care, [id number]- Manual therapy, [id number]- Gait training, [id number]- Aquatic Therapy, 97014- Electrical stimulation (unattended), [id number]- Electrical stimulation (manual), [id number]- Ionotophoresis 4mg /ml Dexamethasone, Dry Needling, Cryotherapy, and Moist heat  PLAN FOR NEXT SESSION: Follow up on return to pilates. Review and  progress HEP prn; progress glute med strength as tolerated. Hip strengthening.    Carlynn Herald, PTA 09/04/23 9:36 AM

## 2023-09-06 ENCOUNTER — Ambulatory Visit: Payer: Medicare PPO

## 2023-09-06 DIAGNOSIS — M25551 Pain in right hip: Secondary | ICD-10-CM

## 2023-09-06 DIAGNOSIS — R2689 Other abnormalities of gait and mobility: Secondary | ICD-10-CM | POA: Diagnosis not present

## 2023-09-06 DIAGNOSIS — M6281 Muscle weakness (generalized): Secondary | ICD-10-CM

## 2023-09-06 NOTE — Patient Instructions (Signed)

## 2023-09-06 NOTE — Therapy (Signed)
OUTPATIENT PHYSICAL THERAPY LOWER EXTREMITY TREATMENT   Patient Name: LEO WEYANDT MRN: [id number] DOB:03/24/1956, 68 y.o., female Today's Date: 09/06/2023  END OF SESSION:  PT End of Session - 09/06/23 1059     Visit Number 4    Number of Visits 13    Date for PT Re-Evaluation 10/07/23    Authorization Type [employer]    Authorization Time Period 12 VISITS APPROVED FOR PT 08/23/2023-10/07/2023    Authorization - Visit Number 4    Authorization - Number of Visits 12    Progress Note Due on Visit 10    PT Start Time 1100    PT Stop Time 1145    PT Time Calculation (min) 45 min    Activity Tolerance Patient tolerated treatment well    Behavior During Therapy [facility] for tasks assessed/performed             Past Medical History:  Diagnosis Date   Arthritis    Concussion 09/12/2019   Past Surgical History:  Procedure Laterality Date   APPENDECTOMY  1968   DILATION AND CURETTAGE OF UTERUS  1990   TONSILLECTOMY  1980   Patient Active Problem List   Diagnosis Date Noted   Right hip pain 08/17/2023   Greater trochanteric bursitis, right 05/31/2023   Tear of right hamstring 02/03/2023   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   Elevated LDL cholesterol level 04/14/2021   Polyarthralgia 03/17/2021   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, right trigger thumb 04/24/2018   Numbness of right third and fourth toes 03/19/2018   Primary osteoarthritis of both hips 12/08/2015   Headache 08/12/2010   Lumbar degenerative disc disease 01/07/2008    PCP: Agapito Games, MD   REFERRING PROVIDER: Monica Becton, MD   REFERRING DIAG: M25.551 (ICD-10-CM) - Right hip pain   THERAPY DIAG:  Pain in right hip  Other abnormalities of gait and mobility  Muscle weakness (generalized)  Rationale for Evaluation and Treatment: Rehabilitation  ONSET DATE: May 2024 with recent worsening January 2025   SUBJECTIVE:   SUBJECTIVE  STATEMENT: Patient reports the hip is feeling better. Tried using the pilates toe corrector, but didn't think she was using it right.   EVAL: Patient reports she tore her hamstring in May 2024 and wasn't diagnosed until June. She had PT for this injury. The hamstring pain is still there, but not as bad. She is now having worsening pain along the side/front of hip that started about 2 weeks ago. She reports that it was swollen. The hip was so painful that she couldn't abduct the hip. She increased her walking speed right around the time that this pain began in the front/side of the hip, so is unsure if this was the cause of her new area of pain. She saw Dr. Karie Schwalbe and had an injection, which seems to have helped with her pain. Rotational movement or transitioning from sit to stand still bothers the hip. Prior to this pain she was walking daily about 3 miles.   PERTINENT HISTORY: Rt hip injection 08/17/23 PAIN:  Are you having pain? Yes: NPRS scale: none currently; 3/10 at worst Pain location: anterolateral hip Pain description: sharp Aggravating factors: rotational movement, transitioning from sit to stand Relieving factors: injection,rest  PRECAUTIONS: None  RED FLAGS: None   WEIGHT BEARING RESTRICTIONS: No  FALLS:  Has patient fallen in last 6 months? No  LIVING ENVIRONMENT: Lives with: lives with their spouse Lives in: House/apartment Stairs: Yes:  Internal: 13 steps; on left going up Has following equipment at home: None  OCCUPATION: retired   PLOF: Independent  PATIENT GOALS: "I would love to have no pain and resume back to normal; make it more manageable."   NEXT MD VISIT: 09/28/23  OBJECTIVE:  Note: Objective measures were completed at Evaluation unless otherwise noted.  DIAGNOSTIC FINDINGS:  Rt Hip MRI: IMPRESSION: 1. Right hamstring tendinosis with a full-thickness minimally retracted tear of the biceps femoris tendon origin with peritendinous edema. 2. Moderate volume  fluid within the right ischiogluteal bursa. 3. Mild tendinosis of the contralateral left hamstring origin. 4. Mild tendinosis of the bilateral gluteus medius and minimus tendons without tear. 5. Mild osteoarthritis of the right hip.  PATIENT SURVEYS:  FOTO 56% function; 64% predicted   COGNITION: Overall cognitive status: Within functional limits for tasks assessed     SENSATION: Not tested  EDEMA:  Not inspected   MUSCLE LENGTH: Not tested   POSTURE: No Significant postural limitations  PALPATION: TTP Rt greater trochanter, lateral gluteals  LOWER EXTREMITY ROM:  Active ROM Right eval Left eval 09/06/23 Right   Hip flexion Full   Full   Hip extension     Hip abduction Full pain   Full pain anterior hip/thigh  Hip adduction     Hip internal rotation 30 pain   28 pain  Hip external rotation 28  30  Knee flexion     Knee extension     Ankle dorsiflexion     Ankle plantarflexion     Ankle inversion     Ankle eversion      (Blank rows = not tested)  LOWER EXTREMITY MMT:  MMT Right eval Left eval 09/06/23 Right   Hip flexion 4+ 4   Hip extension 4 5   Hip abduction 3- pain anterior hip 5 3+ pain anterolateral hip  Hip adduction 5    Hip internal rotation 4+ "twinge" 4   Hip external rotation 5 5   Knee flexion 5 5   Knee extension 5 5   Ankle dorsiflexion     Ankle plantarflexion     Ankle inversion     Ankle eversion      (Blank rows = not tested)  LOWER EXTREMITY SPECIAL TESTS:  Not tested   FUNCTIONAL TESTS:  Not tested   GAIT: Distance walked: 20 ft  Assistive device utilized: None Level of assistance: Complete Independence Comments: Trendelenburg Rt  [facility] Adult PT Treatment:                                                DATE: 09/06/23 Therapeutic Exercise: Resisted hooklying hip abduction red band 2 x 10 Sidelying SLR 2 x 10 RLE; attempted supine SLR in neutral, ER, IR unable due to pain Recumbent bike level 2 x 5 minutes SLS multiple  attempts with/without UE support  Updated HEP  Manual Therapy: STM Rt quadriceps, IT band, hip flexor  Skilled palpation of trigger points   Trigger Point Dry Needling  Initial Treatment: Pt instructed on Dry Needling rational, procedures, and possible side effects. Pt instructed to expect mild to moderate muscle soreness later in the day and/or into the next day.  Pt instructed in methods to reduce muscle soreness. Pt instructed to continue prescribed HEP. Patient verbalized understanding of these instructions and education.   Patient Verbal Consent  Given: Yes Education Handout Provided: Yes Muscles Treated: Rt vastus lateralis, intermedius, rectus femoris  Electrical Stimulation Performed: No Treatment Response/Outcome: reduction in tautness noted     [facility] Adult PT Treatment:                                                DATE: 10/05/2023 Therapeutic Exercise: Quad/hip flexor stretch standing at railing with back foot on chair Standing quad stretch Pilates toe corrector exercises(HEP notes) Bridges + yoga block b/w knees --> pelvis placed on yoga block Supine hip/knee extension with black super band (R)   [facility] Adult PT Treatment:                                                DATE: 08/31/2023 Therapeutic Exercise: Hooklying hip abd isometric with gait belt  Half-long sitting --> adductor/medial hamstring gentle stretch Repeated hip abd isometric (less pain) Hooklying hip add isometric with yoga block Bridges + yoga block b/w knees --> elevated pelvis on 1/2 foam roller TFL self-massage with 4" ball Foam rolling R front hip (TFL/hip flexors) Heel raises + 4" ball b/w ankles  Self Care: Foam roll TFL                                       PATIENT EDUCATION:  Education details: HEP updated  Person educated: Patient Education method: Explanation, Demonstration, Tactile cues, Verbal cues, and Handouts Education comprehension: verbalized understanding, returned  demonstration, verbal cues required, tactile cues required, and needs further education  HOME EXERCISE PROGRAM: Access Code: [email or web address] URL: [email or web address] Date: 09/06/2023 Prepared by: Letitia Libra  Program Notes Pilates toe corrector: *lengthen through inner leg*1. parallel pull apart2. curling toes --> arch --> ankles; reverse3. fan leg open (ER) --> back to parallel4. 1/2 small circles: fan open, roll over toes/arch/ankle, back to parallel --> revers  Exercises - Supine March with Posterior Pelvic Tilt  - 1 x daily - 7 x weekly - 2 sets - 10 reps - Modified Thomas Stretch  - 1 x daily - 7 x weekly - 3 sets - 30 sec  hold - Standing Quadriceps Stretch  - 1 x daily - 7 x weekly - 3 sets - 10 reps - Standing Lobbyist in [place] Split Squat Position  - 1 x daily - 7 x weekly - 3 sets - 10 reps - Supine Bridge with Mini Swiss Ball Between Knees  - 1 x daily - 7 x weekly - 3 sets - 10 reps - Supine Knee and Hip Extension with Resistance  - 1 x daily - 7 x weekly - 3 sets - 10 reps - Hooklying Clamshell with Resistance  - 1 x daily - 7 x weekly - 2 sets - 10 reps - Sidelying Hip Flexion  - 1 x daily - 7 x weekly - 2 sets - 10 reps  ASSESSMENT:  CLINICAL IMPRESSION: Able to initiate light resistance hip abductor strengthening without onset pain in hooklying positioning. Attempted SLR, but unable to tolerate in supine positioning due to pain. Able to complete SLR in sidelying positioning without onset of pain. In standing she is able to  correct pelvic drop when standing on the RLE with UE support, but without UE support she is unable to correct due to pain and weakness. Continues to have pain with active hip abduction and IR AROM. Able to complete brief duration on recumbent bike so was encouraged to utilize bike as an alternative to walking for cardio activity at this time.   EVAL: Patient is a 68 y.o. female who was seen today for physical therapy evaluation and  treatment for acute on  chronic Rt hip pain. She reports worsening of anterolateral hip pain that began about 2 weeks ago when she increased her walking speed during her typical daily walking routine. Upon assessment she is noted to have normal ROM about the hip reporting pain with active abduction and IR. She has significant hip abductor weakness with trendelenburg gait noted on the RLE. She will benefit from skilled PT to address the above stated deficits in order to optimize her function and assist with overall pain reduction.   OBJECTIVE IMPAIRMENTS: Abnormal gait, decreased activity tolerance, decreased balance, decreased knowledge of condition, difficulty walking, decreased strength, improper body mechanics, and pain.   ACTIVITY LIMITATIONS: carrying, lifting, bending, sitting, stairs, transfers, and locomotion level  PARTICIPATION LIMITATIONS: meal prep, cleaning, laundry, shopping, community activity, yard work, and recreation   PERSONAL FACTORS: Age, Time since onset of injury/illness/exacerbation, and 1 comorbidity: osteoarthritis  are also affecting patient's functional outcome.   REHAB POTENTIAL: Good  CLINICAL DECISION MAKING: Stable/uncomplicated  EVALUATION COMPLEXITY: Low   GOALS: Goals reviewed with patient? Yes  SHORT TERM GOALS: Target date: 09/13/2023   Patient will be independent and compliant with initial HEP.  Baseline: issued at eval.  Goal status: MET  2.  Patient will demonstrate pain free Rt hip AROM to improve ability to complete reaching and bending activity.  Baseline: pain with IR and abduction AROM Goal status: not met   3.  Patient will be able to maintain SLS on the RLE with no pelvic drop to improve gait stability.  Baseline: Trendelenburg  09/06/23: unable to maintain level pelvis without UE support  Goal status: not met     LONG TERM GOALS: Target date: 10/07/23  Patient will score at least 64% on FOTO to signify clinically meaningful  improvement in functional abilities.  Baseline: see above  Goal status: INITIAL  2.  Patient will demonstrate at least 4-/5 Rt hip abductor strength to improve stability with walking.  Baseline: see above Goal status: INITIAL  3.  Patient will report pain at worst rated as </=2/10 to reduce current functional limitations.   Baseline: 5 Goal status: INITIAL   PLAN:  PT FREQUENCY: 2x/week  PT DURATION: 6 weeks  PLANNED INTERVENTIONS: 97164- PT Re-evaluation, 97110-Therapeutic exercises, 97530- Therapeutic activity, [id number]- Neuromuscular re-education, 97535- Self Care, 40981- Manual therapy, [id number]- Gait training, [id number]- Aquatic Therapy, 97014- Electrical stimulation (unattended), [id number]- Electrical stimulation (manual), [id number]- Ionotophoresis 4mg /ml Dexamethasone, Dry Needling, Cryotherapy, and Moist heat  PLAN FOR NEXT SESSION: Follow up on return to pilates. Review and progress HEP prn; progress glute med strength as tolerated. Hip strengthening.    Letitia Libra, PT, DPT, ATC 09/06/23 12:41 PM

## 2023-09-11 ENCOUNTER — Ambulatory Visit: Payer: Medicare PPO | Attending: Sports Medicine

## 2023-09-11 DIAGNOSIS — M25551 Pain in right hip: Secondary | ICD-10-CM | POA: Diagnosis not present

## 2023-09-11 DIAGNOSIS — R2689 Other abnormalities of gait and mobility: Secondary | ICD-10-CM | POA: Insufficient documentation

## 2023-09-11 DIAGNOSIS — M25651 Stiffness of right hip, not elsewhere classified: Secondary | ICD-10-CM | POA: Insufficient documentation

## 2023-09-11 DIAGNOSIS — M6281 Muscle weakness (generalized): Secondary | ICD-10-CM | POA: Diagnosis not present

## 2023-09-11 NOTE — Therapy (Signed)
OUTPATIENT PHYSICAL THERAPY LOWER EXTREMITY TREATMENT   Patient Name: Becky Bowers MRN: [id number] DOB:June 25, 1956, 68 y.o., female Today's Date: 09/11/2023  END OF SESSION:  PT End of Session - 09/11/23 0759     Visit Number 5    Number of Visits 13    Date for PT Re-Evaluation 10/07/23    Authorization Type [employer]    Authorization Time Period 12 VISITS APPROVED FOR PT 08/23/2023-10/07/2023    Authorization - Visit Number 5    Authorization - Number of Visits 12    Progress Note Due on Visit 10    PT Start Time 0800    PT Stop Time 0842    PT Time Calculation (min) 42 min    Activity Tolerance Patient tolerated treatment well    Behavior During Therapy [facility] for tasks assessed/performed              Past Medical History:  Diagnosis Date   Arthritis    Concussion 09/12/2019   Past Surgical History:  Procedure Laterality Date   APPENDECTOMY  1968   DILATION AND CURETTAGE OF UTERUS  1990   TONSILLECTOMY  1980   Patient Active Problem List   Diagnosis Date Noted   Right hip pain 08/17/2023   Greater trochanteric bursitis, right 05/31/2023   Tear of right hamstring 02/03/2023   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   Elevated LDL cholesterol level 04/14/2021   Polyarthralgia 03/17/2021   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 It feels swollen. The hamstring seems to be worse." The pain was worse on Friday after she cleaned the house, so it is better today.   EVAL: Patient reports she tore her hamstring in May 2024 and wasn't diagnosed until June. She had PT for this injury. The hamstring pain is still there, but not as bad. She is now having worsening pain along the side/front of hip that started about 2 weeks ago. She reports that it was swollen. The hip was so painful that she couldn't abduct the hip. She increased her walking speed right around the time that this pain began in the front/side of the hip, so is unsure if this was the cause of her new area of pain. She saw Dr. Karie Schwalbe and had an injection, which seems to have helped with her pain. Rotational movement or transitioning from sit to stand still bothers the hip. Prior to this pain she was walking daily about 3 miles.   PERTINENT HISTORY: Rt hip injection 08/17/23 PAIN:  Are you having pain? Yes: NPRS scale: 3/10 Pain location: Rt hip Pain description: dull, ache, sharp  Aggravating factors: rotational movement, transitioning from sit to stand Relieving factors: injection,rest  PRECAUTIONS: None  RED FLAGS: None   WEIGHT BEARING RESTRICTIONS: No  FALLS:  Has patient fallen in last 6 months? No  LIVING ENVIRONMENT: Lives with: lives with their  spouse Lives in: House/apartment Stairs: Yes: Internal: 13 steps; on left going up Has following equipment at home: None  OCCUPATION: retired   PLOF: Independent  PATIENT GOALS: "I would love to have no pain and resume back to normal; make it more manageable."   NEXT MD VISIT: 09/28/23  OBJECTIVE:  Note: Objective measures were completed at Evaluation unless otherwise noted.  DIAGNOSTIC FINDINGS:  Rt Hip MRI: IMPRESSION: 1. Right hamstring tendinosis with a full-thickness minimally retracted tear of the biceps femoris tendon origin with peritendinous edema. 2. Moderate volume  fluid within the right ischiogluteal bursa. 3. Mild tendinosis of the contralateral left hamstring origin. 4. Mild tendinosis of the bilateral gluteus medius and minimus tendons without tear. 5. Mild osteoarthritis of the right hip.  PATIENT SURVEYS:  FOTO 56% function; 64% predicted   COGNITION: Overall cognitive status: Within functional limits for tasks assessed     SENSATION: Not tested  EDEMA:  Not inspected   MUSCLE LENGTH: Not tested   POSTURE: No Significant postural limitations  PALPATION: TTP Rt greater trochanter, lateral gluteals  LOWER EXTREMITY ROM:  Active ROM Right eval Left eval 09/06/23 Right   Hip flexion Full   Full   Hip extension     Hip abduction Full pain   Full pain anterior hip/thigh  Hip adduction     Hip internal rotation 30 pain   28 pain  Hip external rotation 28  30  Knee flexion     Knee extension     Ankle dorsiflexion     Ankle plantarflexion     Ankle inversion     Ankle eversion      (Blank rows = not tested)  LOWER EXTREMITY MMT:  MMT Right eval Left eval 09/06/23 Right   Hip flexion 4+ 4   Hip extension 4 5   Hip abduction 3- pain anterior hip 5 3+ pain anterolateral hip  Hip adduction 5    Hip internal rotation 4+ "twinge" 4   Hip external rotation 5 5   Knee flexion 5 5   Knee extension 5 5   Ankle dorsiflexion     Ankle plantarflexion     Ankle inversion     Ankle eversion      (Blank rows = not tested)  LOWER EXTREMITY SPECIAL TESTS:  Not tested   FUNCTIONAL TESTS:  Not tested   GAIT: Distance walked: 20 ft  Assistive device utilized: None Level of assistance: Complete Independence Comments: Trendelenburg Rt [facility] Adult PT Treatment:                                                DATE: 09/11/23 Therapeutic Exercise: Hooklying hip abduction green band 2 x 10  Clamshell 2 x 10 RLE  Prone hip extension 2 x 10 each  Fire hydrant d/c due to pain Quadruped leg extension 2 x 10  Updated HEP  Manual  Therapy: STM Rt IT band, TFL, quadriceps, lateral hamstring Demo and returned demo of use of foam roller for self-soft tissue mobilization to hamstring, IT band, quadriceps    [facility] Adult PT Treatment:  DATE: 09/06/23 Therapeutic Exercise: Resisted hooklying hip abduction red band 2 x 10 Sidelying SLR 2 x 10 RLE; attempted supine SLR in neutral, ER, IR unable due to pain Recumbent bike level 2 x 5 minutes SLS multiple attempts with/without UE support  Updated HEP  Manual Therapy: STM Rt quadriceps, IT band, hip flexor  Skilled palpation of trigger points   Trigger Point Dry Needling  Initial Treatment: Pt instructed on Dry Needling rational, procedures, and possible side effects. Pt instructed to expect mild to moderate muscle soreness later in the day and/or into the next day.  Pt instructed in methods to reduce muscle soreness. Pt instructed to continue prescribed HEP. Patient verbalized understanding of these instructions and education.   Patient Verbal Consent Given: Yes Education Handout Provided: Yes Muscles Treated: Rt vastus lateralis, intermedius, rectus femoris  Electrical Stimulation Performed: No Treatment Response/Outcome: reduction in tautness noted     [facility] Adult PT Treatment:                                                DATE: 10/05/2023 Therapeutic Exercise: Quad/hip flexor stretch standing at railing with back foot on chair Standing quad stretch Pilates toe corrector exercises(HEP notes) Bridges + yoga block b/w knees --> pelvis placed on yoga block Supine hip/knee extension with black super band (R)    PATIENT EDUCATION:  Education details: HEP updated  Person educated: Patient Education method: Explanation, Demonstration, Tactile cues, Verbal cues, and Handouts Education comprehension: verbalized understanding, returned demonstration, verbal cues required, tactile cues required, and needs further  education  HOME EXERCISE PROGRAM: Access Code: [email or web address] URL: [email or web address] Date: 09/11/2023 Prepared by: Letitia Libra  Program Notes Pilates toe corrector: *lengthen through inner leg*1. parallel pull apart2. curling toes --> arch --> ankles; reverse3. fan leg open (ER) --> back to parallel4. 1/2 small circles: fan open, roll over toes/arch/ankle, back to parallel --> revers  Exercises - Supine March with Posterior Pelvic Tilt  - 1 x daily - 7 x weekly - 2 sets - 10 reps - Modified Thomas Stretch  - 1 x daily - 7 x weekly - 3 sets - 30 sec  hold - Standing Quadriceps Stretch  - 1 x daily - 7 x weekly - 3 sets - 10 reps - Standing Lobbyist in [place] Split Squat Position  - 1 x daily - 7 x weekly - 3 sets - 10 reps - Supine Bridge with Mini Swiss Ball Between Knees  - 1 x daily - 7 x weekly - 3 sets - 10 reps - Supine Knee and Hip Extension with Resistance  - 1 x daily - 7 x weekly - 3 sets - 10 reps - Hooklying Clamshell with Resistance  - 1 x daily - 7 x weekly - 2 sets - 10 reps - Sidelying Hip Flexion  - 1 x daily - 7 x weekly - 2 sets - 10 reps - Clamshell  - 1 x daily - 7 x weekly - 2 sets - 10 reps - Prone Hip Extension  - 1 x daily - 7 x weekly - 2 sets - 10 reps - Quadruped Alternating Leg Extensions  - 1 x daily - 7 x weekly - 2 sets - 10 reps  ASSESSMENT:  CLINICAL IMPRESSION: Patient with increased hip pain attributed to household cleaning over the weekend. She is noted to have  tautness about anterolateral hip musculature/IT band so performed foam rolling today with patient instructed to complete as part of her HEP. Progressed glute med strengthening with patient tolerating increased resistance with hooklying hip abduction and able to complete sidelying clamshell without an increase in pain, though fatigues quickly. Introduced Doctor, hospital with patient unable to complete fire hydrant secondary to pain.   EVAL: Patient is a 68 y.o. female  who was seen today for physical therapy evaluation and treatment for acute on  chronic Rt hip pain. She reports worsening of anterolateral hip pain that began about 2 weeks ago when she increased her walking speed during her typical daily walking routine. Upon assessment she is noted to have normal ROM about the hip reporting pain with active abduction and IR. She has significant hip abductor weakness with trendelenburg gait noted on the RLE. She will benefit from skilled PT to address the above stated deficits in order to optimize her function and assist with overall pain reduction.   OBJECTIVE IMPAIRMENTS: Abnormal gait, decreased activity tolerance, decreased balance, decreased knowledge of condition, difficulty walking, decreased strength, improper body mechanics, and pain.   ACTIVITY LIMITATIONS: carrying, lifting, bending, sitting, stairs, transfers, and locomotion level  PARTICIPATION LIMITATIONS: meal prep, cleaning, laundry, shopping, community activity, yard work, and recreation   PERSONAL FACTORS: Age, Time since onset of injury/illness/exacerbation, and 1 comorbidity: osteoarthritis  are also affecting patient's functional outcome.   REHAB POTENTIAL: Good  CLINICAL DECISION MAKING: Stable/uncomplicated  EVALUATION COMPLEXITY: Low   GOALS: Goals reviewed with patient? Yes  SHORT TERM GOALS: Target date: 09/13/2023   Patient will be independent and compliant with initial HEP.  Baseline: issued at eval.  Goal status: MET  2.  Patient will demonstrate pain free Rt hip AROM to improve ability to complete reaching and bending activity.  Baseline: pain with IR and abduction AROM Goal status: not met   3.  Patient will be able to maintain SLS on the RLE with no pelvic drop to improve gait stability.  Baseline: Trendelenburg  09/06/23: unable to maintain level pelvis without UE support  Goal status: not met     LONG TERM GOALS: Target date: 10/07/23  Patient will score at least  64% on FOTO to signify clinically meaningful improvement in functional abilities.  Baseline: see above  Goal status: INITIAL  2.  Patient will demonstrate at least 4-/5 Rt hip abductor strength to improve stability with walking.  Baseline: see above Goal status: INITIAL  3.  Patient will report pain at worst rated as </=2/10 to reduce current functional limitations.   Baseline: 5 Goal status: INITIAL   PLAN:  PT FREQUENCY: 2x/week  PT DURATION: 6 weeks  PLANNED INTERVENTIONS: 97164- PT Re-evaluation, 97110-Therapeutic exercises, 97530- Therapeutic activity, [id number]- Neuromuscular re-education, 97535- Self Care, 16109- Manual therapy, [id number]- Gait training, [id number]- Aquatic Therapy, 97014- Electrical stimulation (unattended), [id number]- Electrical stimulation (manual), [id number]- Ionotophoresis 4mg /ml Dexamethasone, Dry Needling, Cryotherapy, and Moist heat  PLAN FOR NEXT SESSION: Follow up on return to pilates. Review and progress HEP prn; progress glute med strength as tolerated. Hip strengthening.    Letitia Libra, PT, DPT, ATC 09/11/23 8:43 AM

## 2023-09-14 ENCOUNTER — Ambulatory Visit: Payer: Medicare PPO

## 2023-09-14 DIAGNOSIS — R2689 Other abnormalities of gait and mobility: Secondary | ICD-10-CM | POA: Diagnosis not present

## 2023-09-14 DIAGNOSIS — M25651 Stiffness of right hip, not elsewhere classified: Secondary | ICD-10-CM | POA: Diagnosis not present

## 2023-09-14 DIAGNOSIS — M6281 Muscle weakness (generalized): Secondary | ICD-10-CM

## 2023-09-14 DIAGNOSIS — M25551 Pain in right hip: Secondary | ICD-10-CM | POA: Diagnosis not present

## 2023-09-14 NOTE — Therapy (Signed)
 OUTPATIENT PHYSICAL THERAPY LOWER EXTREMITY TREATMENT   Patient Name: Becky Bowers MRN: [id number] DOB:1956/07/25, 68 y.o., female Today's Date: 09/14/2023  END OF SESSION:  PT End of Session - 09/14/23 0801     Visit Number 6    Number of Visits 13    Date for PT Re-Evaluation 10/07/23    Authorization Type [employer]    Authorization Time Period 12 VISITS APPROVED FOR PT 08/23/2023-10/07/2023    Authorization - Visit Number 6    Authorization - Number of Visits 12    Progress Note Due on Visit 10    PT Start Time 0800    PT Stop Time 0850    PT Time Calculation (min) 50 min    Activity Tolerance Patient tolerated treatment well    Behavior During Therapy [facility] for tasks assessed/performed              Past Medical History:  Diagnosis Date   Arthritis    Concussion 09/12/2019   Past Surgical History:  Procedure Laterality Date   APPENDECTOMY  1968   DILATION AND CURETTAGE OF UTERUS  1990   TONSILLECTOMY  1980   Patient Active Problem List   Diagnosis Date Noted   Right hip pain 08/17/2023   Greater trochanteric bursitis, right 05/31/2023   Tear of right hamstring 02/03/2023   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   Elevated LDL cholesterol level 04/14/2021   Polyarthralgia 03/17/2021   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, right trigger thumb 04/24/2018   Numbness of right third and fourth toes 03/19/2018   Primary osteoarthritis of both hips 12/08/2015   Headache 08/12/2010   Lumbar degenerative disc disease 01/07/2008    PCP: Alvan Dorothyann BIRCH, MD   REFERRING PROVIDER: Curtis Debby PARAS, MD   REFERRING DIAG: M25.551 (ICD-10-CM) - Right hip pain   THERAPY DIAG:  Pain in right hip  Other abnormalities of gait and mobility  Muscle weakness (generalized)  Stiffness of right hip, not elsewhere classified  Rationale for Evaluation and Treatment: Rehabilitation  ONSET DATE: May 2024 with recent worsening  January 2025   SUBJECTIVE:   SUBJECTIVE STATEMENT: Patient reports she is feeling better since Friday, states she continues to have soreness along R ITB and hamstring.  EVAL: Patient reports she tore her hamstring in May 2024 and wasn't diagnosed until June. She had PT for this injury. The hamstring pain is still there, but not as bad. She is now having worsening pain along the side/front of hip that started about 2 weeks ago. She reports that it was swollen. The hip was so painful that she couldn't abduct the hip. She increased her walking speed right around the time that this pain began in the front/side of the hip, so is unsure if this was the cause of her new area of pain. She saw Dr. ONEIDA and had an injection, which seems to have helped with her pain. Rotational movement or transitioning from sit to stand still bothers the hip. Prior to this pain she was walking daily about 3 miles.   PERTINENT HISTORY: Rt hip injection 08/17/23 PAIN:  Are you having pain? Yes: NPRS scale: 3/10 Pain location: Rt hip Pain description: dull, ache, sharp  Aggravating factors: rotational movement, transitioning from sit to stand Relieving factors: injection,rest  PRECAUTIONS: None  RED FLAGS: None   WEIGHT BEARING RESTRICTIONS: No  FALLS:  Has patient fallen in last 6 months? No  LIVING ENVIRONMENT: Lives with: lives with their spouse  Lives in: House/apartment Stairs: Yes: Internal: 13 steps; on left going up Has following equipment at home: None  OCCUPATION: retired   PLOF: Independent  PATIENT GOALS: I would love to have no pain and resume back to normal; make it more manageable.   NEXT MD VISIT: 09/28/23  OBJECTIVE:  Note: Objective measures were completed at Evaluation unless otherwise noted.  DIAGNOSTIC FINDINGS:  Rt Hip MRI: IMPRESSION: 1. Right hamstring tendinosis with a full-thickness minimally retracted tear of the biceps femoris tendon origin with peritendinous edema. 2.  Moderate volume fluid within the right ischiogluteal bursa. 3. Mild tendinosis of the contralateral left hamstring origin. 4. Mild tendinosis of the bilateral gluteus medius and minimus tendons without tear. 5. Mild osteoarthritis of the right hip.  PATIENT SURVEYS:  FOTO 56% function; 64% predicted   COGNITION: Overall cognitive status: Within functional limits for tasks assessed     SENSATION: Not tested  EDEMA:  Not inspected   MUSCLE LENGTH: Not tested   POSTURE: No Significant postural limitations  PALPATION: TTP Rt greater trochanter, lateral gluteals  LOWER EXTREMITY ROM:  Active ROM Right eval Left eval 09/06/23 Right   Hip flexion Full   Full   Hip extension     Hip abduction Full pain   Full pain anterior hip/thigh  Hip adduction     Hip internal rotation 30 pain   28 pain  Hip external rotation 28  30  Knee flexion     Knee extension     Ankle dorsiflexion     Ankle plantarflexion     Ankle inversion     Ankle eversion      (Blank rows = not tested)  LOWER EXTREMITY MMT:  MMT Right eval Left eval 09/06/23 Right   Hip flexion 4+ 4   Hip extension 4 5   Hip abduction 3- pain anterior hip 5 3+ pain anterolateral hip  Hip adduction 5    Hip internal rotation 4+ twinge 4   Hip external rotation 5 5   Knee flexion 5 5   Knee extension 5 5   Ankle dorsiflexion     Ankle plantarflexion     Ankle inversion     Ankle eversion      (Blank rows = not tested)  LOWER EXTREMITY SPECIAL TESTS:  Not tested   FUNCTIONAL TESTS:  Not tested   GAIT: Distance walked: 20 ft  Assistive device utilized: None Level of assistance: Complete Independence Comments: Trendelenburg Rt  [facility] Adult PT Treatment:                                                DATE: 09/14/2023 Therapeutic Exercise: Active (R) quad stretch in lunge with R foot propped on chair R leg propped on chair, reaching underneath R thigh with L arm (active stretch) 1/2 kneeling(R leg  straight out to side) rock back to heel --> R adductor stretch Standing adductor foot slides (out/in with feet on washcloths) --> 1/10 pain in R thigh Standing glute med ball press at wall (R SLS) x8 R SL divers to chair back --> added RTB at medial ankle (no shoes) R SLS heel raises + tennis ball b/w ankles (no shoe) Double leg heel raises + tennis ball b/w ankles (no shoes) Bridges + GTB at knees (hip abd) & block b/w feet (stability    [facility] Adult  PT Treatment:                                                DATE: 09/11/23 Therapeutic Exercise: Hooklying hip abduction green band 2 x 10  Clamshell 2 x 10 RLE  Prone hip extension 2 x 10 each  Fire hydrant d/c due to pain Quadruped leg extension 2 x 10  Updated HEP  Manual Therapy: STM Rt IT band, TFL, quadriceps, lateral hamstring Demo and returned demo of use of foam roller for self-soft tissue mobilization to hamstring, IT band, quadriceps    [facility] Adult PT Treatment:                                                DATE: 09/06/23 Therapeutic Exercise: Resisted hooklying hip abduction red band 2 x 10 Sidelying SLR 2 x 10 RLE; attempted supine SLR in neutral, ER, IR unable due to pain Recumbent bike level 2 x 5 minutes SLS multiple attempts with/without UE support  Updated HEP  Manual Therapy: STM Rt quadriceps, IT band, hip flexor  Skilled palpation of trigger points   Trigger Point Dry Needling  Initial Treatment: Pt instructed on Dry Needling rational, procedures, and possible side effects. Pt instructed to expect mild to moderate muscle soreness later in the day and/or into the next day.  Pt instructed in methods to reduce muscle soreness. Pt instructed to continue prescribed HEP. Patient verbalized understanding of these instructions and education.   Patient Verbal Consent Given: Yes Education Handout Provided: Yes Muscles Treated: Rt vastus lateralis, intermedius, rectus femoris  Electrical Stimulation Performed:  No Treatment Response/Outcome: reduction in tautness noted    PATIENT EDUCATION:  Education details: HEP updated  Person educated: Patient Education method: Explanation, Demonstration, Tactile cues, Verbal cues, and Handouts Education comprehension: verbalized understanding, returned demonstration, verbal cues required, tactile cues required, and needs further education  HOME EXERCISE PROGRAM: Access Code: [email or web address] URL: [email or web address] Date: 09/14/2023 Prepared by: Lamarr Price  Program Notes Pilates toe corrector: *lengthen through inner leg*1. parallel pull apart2. curling toes --> arch --> ankles; reverse3. fan leg open (ER) --> back to parallel4. 1/2 small circles: fan open, roll over toes/arch/ankle, back to parallel --> revers  Exercises - Supine March with Posterior Pelvic Tilt  - 1 x daily - 7 x weekly - 2 sets - 10 reps - Modified Thomas Stretch  - 1 x daily - 7 x weekly - 3 sets - 30 sec  hold - Standing Quadriceps Stretch  - 1 x daily - 7 x weekly - 3 sets - 10 reps - Standing Lobbyist in [employer] Position  - 1 x daily - 7 x weekly - 3 sets - 10 reps - Supine Bridge with Mini Swiss Ball Between Knees  - 1 x daily - 7 x weekly - 3 sets - 10 reps - Supine Knee and Hip Extension with Resistance  - 1 x daily - 7 x weekly - 3 sets - 10 reps - Hooklying Clamshell with Resistance  - 1 x daily - 7 x weekly - 2 sets - 10 reps - Sidelying Hip Flexion  - 1 x daily - 7 x weekly - 2 sets - 10 reps - Clamshell  -  1 x daily - 7 x weekly - 2 sets - 10 reps - Prone Hip Extension  - 1 x daily - 7 x weekly - 2 sets - 10 reps - Quadruped Alternating Leg Extensions  - 1 x daily - 7 x weekly - 2 sets - 10 reps - Primal Push Up  - 1 x daily - 7 x weekly - 3 sets - 10 reps - The Diver  - 1 x daily - 7 x weekly - 3 sets - 10 reps - Single Leg Heel Raise  - 1 x daily - 7 x weekly - 3 sets - 10 reps - Supine Bridge with Resistance Band  - 1 x daily - 7 x weekly  - 3 sets - 10 reps  ASSESSMENT:  CLINICAL IMPRESSION: Mild pain in R anteriorlateral thigh during standing abd/add foot slides; exercise discontinued and following exercises focused on R medial ankle/foot stability. Noted difficulty with maintaining stability through great toe on R side. Increased hamstring activation on R LE reported during bridges with incorporation of hip abd activation and medial ankle/foot stabilization.   EVAL: Patient is a 68 y.o. female who was seen today for physical therapy evaluation and treatment for acute on  chronic Rt hip pain. She reports worsening of anterolateral hip pain that began about 2 weeks ago when she increased her walking speed during her typical daily walking routine. Upon assessment she is noted to have normal ROM about the hip reporting pain with active abduction and IR. She has significant hip abductor weakness with trendelenburg gait noted on the RLE. She will benefit from skilled PT to address the above stated deficits in order to optimize her function and assist with overall pain reduction.   OBJECTIVE IMPAIRMENTS: Abnormal gait, decreased activity tolerance, decreased balance, decreased knowledge of condition, difficulty walking, decreased strength, improper body mechanics, and pain.   ACTIVITY LIMITATIONS: carrying, lifting, bending, sitting, stairs, transfers, and locomotion level  PARTICIPATION LIMITATIONS: meal prep, cleaning, laundry, shopping, community activity, yard work, and recreation   PERSONAL FACTORS: Age, Time since onset of injury/illness/exacerbation, and 1 comorbidity: osteoarthritis  are also affecting patient's functional outcome.   REHAB POTENTIAL: Good  CLINICAL DECISION MAKING: Stable/uncomplicated  EVALUATION COMPLEXITY: Low   GOALS: Goals reviewed with patient? Yes  SHORT TERM GOALS: Target date: 09/13/2023  Patient will be independent and compliant with initial HEP.  Baseline: issued at eval.  Goal status:  MET  2.  Patient will demonstrate pain free Rt hip AROM to improve ability to complete reaching and bending activity.  Baseline: pain with IR and abduction AROM Goal status: not met   3.  Patient will be able to maintain SLS on the RLE with no pelvic drop to improve gait stability.  Baseline: Trendelenburg  09/06/23: unable to maintain level pelvis without UE support  Goal status: not met     LONG TERM GOALS: Target date: 10/07/23  Patient will score at least 64% on FOTO to signify clinically meaningful improvement in functional abilities.  Baseline: see above  Goal status: INITIAL  2.  Patient will demonstrate at least 4-/5 Rt hip abductor strength to improve stability with walking.  Baseline: see above Goal status: INITIAL  3.  Patient will report pain at worst rated as </=2/10 to reduce current functional limitations.   Baseline: 5 Goal status: INITIAL   PLAN:  PT FREQUENCY: 2x/week  PT DURATION: 6 weeks  PLANNED INTERVENTIONS: 97164- PT Re-evaluation, 97110-Therapeutic exercises, 97530- Therapeutic activity, [id number]- Neuromuscular re-education, 97535-  Self Care, [id number]- Manual therapy, [id number]- Gait training, [id number]- Aquatic Therapy, 97014- Electrical stimulation (unattended), [id number]- Electrical stimulation (manual), [id number]- Ionotophoresis 4mg /ml Dexamethasone , Dry Needling, Cryotherapy, and Moist heat  PLAN FOR NEXT SESSION: Review and progress HEP prn; progress glute med strength as tolerated. Hip strengthening.    Lamarr Price, PTA 09/14/23 8:56 AM

## 2023-09-18 ENCOUNTER — Encounter: Payer: Self-pay | Admitting: Obstetrics & Gynecology

## 2023-09-18 ENCOUNTER — Other Ambulatory Visit (HOSPITAL_COMMUNITY)
Admission: RE | Admit: 2023-09-18 | Discharge: 2023-09-18 | Disposition: A | Discharge status: Home / Self Care | Payer: Medicare PPO | Source: Ambulatory Visit | Attending: Obstetrics & Gynecology | Admitting: Obstetrics & Gynecology

## 2023-09-18 ENCOUNTER — Ambulatory Visit (INDEPENDENT_AMBULATORY_CARE_PROVIDER_SITE_OTHER): Payer: Medicare PPO | Admitting: Obstetrics & Gynecology

## 2023-09-18 ENCOUNTER — Ambulatory Visit: Payer: Medicare PPO

## 2023-09-18 VITALS — BP 110/67 | HR 56 | Ht 64.0 in | Wt 126.0 lb

## 2023-09-18 DIAGNOSIS — M6289 Other specified disorders of muscle: Secondary | ICD-10-CM

## 2023-09-18 DIAGNOSIS — M25551 Pain in right hip: Secondary | ICD-10-CM | POA: Diagnosis not present

## 2023-09-18 DIAGNOSIS — N94819 Vulvodynia, unspecified: Secondary | ICD-10-CM

## 2023-09-18 DIAGNOSIS — M6281 Muscle weakness (generalized): Secondary | ICD-10-CM | POA: Diagnosis not present

## 2023-09-18 DIAGNOSIS — M25651 Stiffness of right hip, not elsewhere classified: Secondary | ICD-10-CM

## 2023-09-18 DIAGNOSIS — N898 Other specified noninflammatory disorders of vagina: Secondary | ICD-10-CM | POA: Insufficient documentation

## 2023-09-18 DIAGNOSIS — R2689 Other abnormalities of gait and mobility: Secondary | ICD-10-CM

## 2023-09-18 MED ORDER — CLOBETASOL PROPIONATE 0.05 % EX OINT: 1.0000 | TOPICAL_OINTMENT | Freq: Two times a day (BID) | CUTANEOUS | 0 refills | Status: DC

## 2023-09-18 NOTE — Therapy (Signed)
 OUTPATIENT PHYSICAL THERAPY LOWER EXTREMITY TREATMENT   Patient Name: Becky Bowers MRN: [id number] DOB:Jul 25, 1956, 68 y.o., female Today's Date: 09/18/2023  END OF SESSION:  PT End of Session - 09/18/23 0843     Visit Number 7    Number of Visits 13    Date for PT Re-Evaluation 10/07/23    Authorization Type [employer]    Authorization Time Period 12 VISITS APPROVED FOR PT 08/23/2023-10/07/2023    Authorization - Visit Number 7    Authorization - Number of Visits 12    Progress Note Due on Visit 10    PT Start Time 0844    PT Stop Time 0928    PT Time Calculation (min) 44 min    Activity Tolerance Patient tolerated treatment well    Behavior During Therapy [facility] for tasks assessed/performed            Past Medical History:  Diagnosis Date   Arthritis    Concussion 09/12/2019   Past Surgical History:  Procedure Laterality Date   APPENDECTOMY  1968   DILATION AND CURETTAGE OF UTERUS  1990   TONSILLECTOMY  1980   Patient Active Problem List   Diagnosis Date Noted   Right hip pain 08/17/2023   Greater trochanteric bursitis, right 05/31/2023   Tear of right hamstring 02/03/2023   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   Elevated LDL cholesterol level 04/14/2021   Polyarthralgia 03/17/2021   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, right trigger thumb 04/24/2018   Numbness of right third and fourth toes 03/19/2018   Primary osteoarthritis of both hips 12/08/2015   Headache 08/12/2010   Lumbar degenerative disc disease 01/07/2008    PCP: Cydney Draft, MD   REFERRING PROVIDER: Gean Keels, MD   REFERRING DIAG: M25.551 (ICD-10-CM) - Right hip pain   THERAPY DIAG:  Pain in right hip  Other abnormalities of gait and mobility  Muscle weakness (generalized)  Stiffness of right hip, not elsewhere classified  Rationale for Evaluation and Treatment: Rehabilitation  ONSET DATE: May 2024 with recent worsening  January 2025   SUBJECTIVE:   SUBJECTIVE STATEMENT: Patient reports she is sore in lateral thigh, primarily from mid thigh to knee. Patient states she had increased pain Thursday night that lasted until Friday morning. Patient states she went to a movie and was uncomfortable sitting for a prolonged time. Patient states she has noticed that she "better off to do exercises earlier in the day than later". Patient states she is still getting swelling in hip.   EVAL: Patient reports she tore her hamstring in May 2024 and wasn't diagnosed until June. She had PT for this injury. The hamstring pain is still there, but not as bad. She is now having worsening pain along the side/front of hip that started about 2 weeks ago. She reports that it was swollen. The hip was so painful that she couldn't abduct the hip. She increased her walking speed right around the time that this pain began in the front/side of the hip, so is unsure if this was the cause of her new area of pain. She saw Dr. Elva Hamburger and had an injection, which seems to have helped with her pain. Rotational movement or transitioning from sit to stand still bothers the hip. Prior to this pain she was walking daily about 3 miles.   PERTINENT HISTORY: Rt hip injection 08/17/23 PAIN:  Are you having pain? Yes: NPRS scale: 3/10 Pain location: Rt hip Pain description:  dull, ache, sharp  Aggravating factors: rotational movement, transitioning from sit to stand Relieving factors: injection,rest  PRECAUTIONS: None  RED FLAGS: None   WEIGHT BEARING RESTRICTIONS: No  FALLS:  Has patient fallen in last 6 months? No  LIVING ENVIRONMENT: Lives with: lives with their spouse Lives in: House/apartment Stairs: Yes: Internal: 13 steps; on left going up Has following equipment at home: None  OCCUPATION: retired   PLOF: Independent  PATIENT GOALS: "I would love to have no pain and resume back to normal; make it more manageable."   NEXT MD VISIT:  09/28/23  OBJECTIVE:  Note: Objective measures were completed at Evaluation unless otherwise noted.  DIAGNOSTIC FINDINGS:  Rt Hip MRI: IMPRESSION: 1. Right hamstring tendinosis with a full-thickness minimally retracted tear of the biceps femoris tendon origin with peritendinous edema. 2. Moderate volume fluid within the right ischiogluteal bursa. 3. Mild tendinosis of the contralateral left hamstring origin. 4. Mild tendinosis of the bilateral gluteus medius and minimus tendons without tear. 5. Mild osteoarthritis of the right hip.  PATIENT SURVEYS:  FOTO 56% function; 64% predicted   COGNITION: Overall cognitive status: Within functional limits for tasks assessed     SENSATION: Not tested  EDEMA:  Not inspected   MUSCLE LENGTH: Not tested   POSTURE: No Significant postural limitations  PALPATION: TTP Rt greater trochanter, lateral gluteals  LOWER EXTREMITY ROM:  Active ROM Right eval Left eval 09/06/23 Right   Hip flexion Full   Full   Hip extension     Hip abduction Full pain   Full pain anterior hip/thigh  Hip adduction     Hip internal rotation 30 pain   28 pain  Hip external rotation 28  30  Knee flexion     Knee extension     Ankle dorsiflexion     Ankle plantarflexion     Ankle inversion     Ankle eversion      (Blank rows = not tested)  LOWER EXTREMITY MMT:  MMT Right eval Left eval 09/06/23 Right   Hip flexion 4+ 4   Hip extension 4 5   Hip abduction 3- pain anterior hip 5 3+ pain anterolateral hip  Hip adduction 5    Hip internal rotation 4+ "twinge" 4   Hip external rotation 5 5   Knee flexion 5 5   Knee extension 5 5   Ankle dorsiflexion     Ankle plantarflexion     Ankle inversion     Ankle eversion      (Blank rows = not tested)  LOWER EXTREMITY SPECIAL TESTS:  Not tested   FUNCTIONAL TESTS:  Not tested   GAIT: Distance walked: 20 ft  Assistive device utilized: None Level of assistance: Complete Independence Comments:  Trendelenburg Rt  [facility] Adult PT Treatment:                                                DATE: 09/18/2023 Therapeutic Exercise: Quadruped: Rock back in [employer] back in child's pose Prone hip extension from toe tuck position 2x10 (R) --> L x10 to compare Quadruped hydrants + YTB 2x10 Quadruped hip hikes --> opp knee on block Bridges + Blue TB at knees (hip abd) & block b/w feet (stability)  Figure 4 bridge x10 Standing glute med press with ball in wall x10 Standing SLB + slow lateral  weight shifting --> added mirror for postural awareness   [facility] Adult PT Treatment:                                                DATE: 09/14/2023 Therapeutic Exercise: Active (R) quad stretch in lunge with R foot propped on chair R leg propped on chair, reaching underneath R thigh with L arm (active stretch) 1/2 kneeling(R leg straight out to side) rock back to heel --> R adductor stretch Standing adductor foot slides (out/in with feet on washcloths) --> 1/10 pain in R thigh Standing glute med ball press at wall (R SLS) x8 R SL divers to chair back --> added RTB at medial ankle (no shoes) R SLS heel raises + tennis ball b/w ankles (no shoe) Double leg heel raises + tennis ball b/w ankles (no shoes) Bridges + GTB at knees (hip abd) & block b/w feet (stability    [facility] Adult PT Treatment:                                                DATE: 09/11/23 Therapeutic Exercise: Hooklying hip abduction green band 2 x 10  Clamshell 2 x 10 RLE  Prone hip extension 2 x 10 each  Fire hydrant d/c due to pain Quadruped leg extension 2 x 10  Updated HEP  Manual Therapy: STM Rt IT band, TFL, quadriceps, lateral hamstring Demo and returned demo of use of foam roller for self-soft tissue mobilization to hamstring, IT band, quadriceps    [facility] Adult PT Treatment:                                                DATE: 09/06/23 Therapeutic Exercise: Resisted hooklying hip abduction red band 2 x 10 Sidelying SLR 2 x  10 RLE; attempted supine SLR in neutral, ER, IR unable due to pain Recumbent bike level 2 x 5 minutes SLS multiple attempts with/without UE support  Updated HEP  Manual Therapy: STM Rt quadriceps, IT band, hip flexor  Skilled palpation of trigger points   Trigger Point Dry Needling  Initial Treatment: Pt instructed on Dry Needling rational, procedures, and possible side effects. Pt instructed to expect mild to moderate muscle soreness later in the day and/or into the next day.  Pt instructed in methods to reduce muscle soreness. Pt instructed to continue prescribed HEP. Patient verbalized understanding of these instructions and education.   Patient Verbal Consent Given: Yes Education Handout Provided: Yes Muscles Treated: Rt vastus lateralis, intermedius, rectus femoris  Electrical Stimulation Performed: No Treatment Response/Outcome: reduction in tautness noted    PATIENT EDUCATION:  Education details: HEP updated  Person educated: Patient Education method: Explanation, Demonstration, Tactile cues, Verbal cues, and Handouts Education comprehension: verbalized understanding, returned demonstration, verbal cues required, tactile cues required, and needs further education  HOME EXERCISE PROGRAM: Access Code: [email or web address] URL: [email or web address] Date: 09/14/2023 Prepared by: Sims Duck  Program Notes Pilates toe corrector: *lengthen through inner leg*1. parallel pull apart2. curling toes --> arch --> ankles; reverse3. fan leg open (ER) --> back to parallel4. 1/2 small circles: fan open,  roll over toes/arch/ankle, back to parallel --> revers  Exercises - Supine March with Posterior Pelvic Tilt  - 1 x daily - 7 x weekly - 2 sets - 10 reps - Modified Thomas Stretch  - 1 x daily - 7 x weekly - 3 sets - 30 sec  hold - Standing Quadriceps Stretch  - 1 x daily - 7 x weekly - 3 sets - 10 reps - Standing Lobbyist in [employer] Position  - 1 x daily - 7 x  weekly - 3 sets - 10 reps - Supine Bridge with Mini Swiss Ball Between Knees  - 1 x daily - 7 x weekly - 3 sets - 10 reps - Supine Knee and Hip Extension with Resistance  - 1 x daily - 7 x weekly - 3 sets - 10 reps - Hooklying Clamshell with Resistance  - 1 x daily - 7 x weekly - 2 sets - 10 reps - Sidelying Hip Flexion  - 1 x daily - 7 x weekly - 2 sets - 10 reps - Clamshell  - 1 x daily - 7 x weekly - 2 sets - 10 reps - Prone Hip Extension  - 1 x daily - 7 x weekly - 2 sets - 10 reps - Quadruped Alternating Leg Extensions  - 1 x daily - 7 x weekly - 2 sets - 10 reps - Primal Push Up  - 1 x daily - 7 x weekly - 3 sets - 10 reps - The Diver  - 1 x daily - 7 x weekly - 3 sets - 10 reps - Single Leg Heel Raise  - 1 x daily - 7 x weekly - 3 sets - 10 reps - Supine Bridge with Resistance Band  - 1 x daily - 7 x weekly - 3 sets - 10 reps  ASSESSMENT:  CLINICAL IMPRESSION: Adding light resistance alleviated mild pain at lateral thigh during hip abd in quadruped position. Continued focus on single leg stability and glute med activation and engagement. Improved pelvic stability noted with lateral weight shifting and single leg stance on R LE, with patient demonstrating improved self-correction awareness. Patient continues to have intermittent mild pain at R lateral thigh.  EVAL: Patient is a 68 y.o. female who was seen today for physical therapy evaluation and treatment for acute on  chronic Rt hip pain. She reports worsening of anterolateral hip pain that began about 2 weeks ago when she increased her walking speed during her typical daily walking routine. Upon assessment she is noted to have normal ROM about the hip reporting pain with active abduction and IR. She has significant hip abductor weakness with trendelenburg gait noted on the RLE. She will benefit from skilled PT to address the above stated deficits in order to optimize her function and assist with overall pain reduction.   OBJECTIVE  IMPAIRMENTS: Abnormal gait, decreased activity tolerance, decreased balance, decreased knowledge of condition, difficulty walking, decreased strength, improper body mechanics, and pain.   ACTIVITY LIMITATIONS: carrying, lifting, bending, sitting, stairs, transfers, and locomotion level  PARTICIPATION LIMITATIONS: meal prep, cleaning, laundry, shopping, community activity, yard work, and recreation   PERSONAL FACTORS: Age, Time since onset of injury/illness/exacerbation, and 1 comorbidity: osteoarthritis  are also affecting patient's functional outcome.   REHAB POTENTIAL: Good  CLINICAL DECISION MAKING: Stable/uncomplicated  EVALUATION COMPLEXITY: Low   GOALS: Goals reviewed with patient? Yes  SHORT TERM GOALS: Target date: 09/13/2023  Patient will be independent and compliant with initial HEP.  Baseline: issued at eval.  Goal status: MET  2.  Patient will demonstrate pain free Rt hip AROM to improve ability to complete reaching and bending activity.  Baseline: pain with IR and abduction AROM Goal status: not met   3.  Patient will be able to maintain SLS on the RLE with no pelvic drop to improve gait stability.  Baseline: Trendelenburg  09/06/23: unable to maintain level pelvis without UE support  Goal status: not met     LONG TERM GOALS: Target date: 10/07/23  Patient will score at least 64% on FOTO to signify clinically meaningful improvement in functional abilities.  Baseline: see above  Goal status: INITIAL  2.  Patient will demonstrate at least 4-/5 Rt hip abductor strength to improve stability with walking.  Baseline: see above Goal status: INITIAL  3.  Patient will report pain at worst rated as </=2/10 to reduce current functional limitations.   Baseline: 5 Goal status: INITIAL   PLAN:  PT FREQUENCY: 2x/week  PT DURATION: 6 weeks  PLANNED INTERVENTIONS: 97164- PT Re-evaluation, 97110-Therapeutic exercises, 97530- Therapeutic activity, [id number]- Neuromuscular  re-education, 97535- Self Care, 16109- Manual therapy, [id number]- Gait training, [id number]- Aquatic Therapy, 97014- Electrical stimulation (unattended), [id number]- Electrical stimulation (manual), [id number]- Ionotophoresis 4mg /ml Dexamethasone , Dry Needling, Cryotherapy, and Moist heat  PLAN FOR NEXT SESSION: Review and progress HEP prn; progress glute med strength as tolerated. Hip strengthening; single leg exercises/activities    Sims Duck, PTA 09/18/23 9:28 AM

## 2023-09-18 NOTE — Progress Notes (Signed)
   Subjective:     Becky Bowers is a 68 y.o. female here for a routine exam.  Current complaints: vaginal burning  No discharge.   Gynecologic History No LMP recorded. Patient is postmenopausal. Contraception: post menopausal status Last Mammogram: 10/07/22- next one scheduled for 10/11/23 Last Pap Smear:  09/06/21- neg Last Colon Screening;  Jan 2024- normal Seat Belts:   yes Sun Screen:   yes Dental Check Up:  yes Brush & Floss:  yes     Obstetric History OB History  Gravida Para Term Preterm AB Living  2 1   1 1   SAB IAB Ectopic Multiple Live Births  1        # Outcome Date GA Lbr Len/2nd Weight Sex Type Anes PTL Lv  2 SAB           1 Para              The following portions of the patient's history were reviewed and updated as appropriate: allergies, current medications, past family history, past medical history, past social history, past surgical history, and problem list.  Review of Systems Pertinent items noted in HPI and remainder of comprehensive ROS otherwise negative.    Objective:     Vitals:   09/18/23 1315  BP: 110/67  Pulse: (!) 56  Weight: 126 lb (57.2 kg)  Height: 5\' 4"  (1.626 m)   Vitals:  WNL General appearance: alert, cooperative and no distress  HEENT: Normocephalic, without obvious abnormality, atraumatic Eyes: negative Throat: lips, mucosa, and tongue normal; teeth and gums normal  Respiratory: Clear to auscultation bilaterally  CV: Regular rate and rhythm  Breasts:  Normal appearance, no masses or tenderness, no nipple retraction or dimpling  GI: Soft, non-tender; bowel sounds normal; no masses,  no organomegaly  GU: External Genitalia:  Tanner V Tenderness to q tip at intoirtus near skenes and vestibular glands. Urethra:  No prolapse   Vagina: Pink, normal rugae, no blood or discharge--well estrogenized with vagifem; increased tone of pelvic diaphragm  Cervix: No CMT, no lesion  Uterus:  Normal size and contour, non tender  Adnexa:  Normal, no masses, non tender  Musculoskeletal: No edema, redness or tenderness in the calves or thighs  Skin: No lesions or rash  Lymphatic: Axillary adenopathy: none     Psychiatric: Normal mood and behavior        Assessment:    Healthy female exam.    Plan:    Pap smears not indicated. >66 with no history of CIN 2 or 3; regularly screened Vulvar/introitus burning--aptima to r/o infection.  Clobetsol peas sized amount to burning areas bid for 1 week, every day for 1 week then twice the 3rd week.  Recheck in office If not better then pelvic PT Continue vagifem  30 mins spent during encounter including review of records, history and physical, counseling, and documentation.  Reviewed vulvar hygiene and moisturizers.  Becky Bowers will not start a vulvar/vaginal moisturizers until after the clobetasol taper.

## 2023-09-19 LAB — CERVICOVAGINAL ANCILLARY ONLY
Bacterial Vaginitis (gardnerella): POSITIVE — AB
Candida Glabrata: NEGATIVE
Candida Vaginitis: NEGATIVE
Comment: NEGATIVE
Comment: NEGATIVE
Comment: NEGATIVE

## 2023-09-20 ENCOUNTER — Ambulatory Visit: Payer: Medicare PPO

## 2023-09-20 ENCOUNTER — Other Ambulatory Visit: Payer: Self-pay | Admitting: Medical Genetics

## 2023-09-20 DIAGNOSIS — M6281 Muscle weakness (generalized): Secondary | ICD-10-CM | POA: Diagnosis not present

## 2023-09-20 DIAGNOSIS — M25551 Pain in right hip: Secondary | ICD-10-CM

## 2023-09-20 DIAGNOSIS — R2689 Other abnormalities of gait and mobility: Secondary | ICD-10-CM

## 2023-09-20 DIAGNOSIS — M25651 Stiffness of right hip, not elsewhere classified: Secondary | ICD-10-CM | POA: Diagnosis not present

## 2023-09-20 NOTE — Therapy (Signed)
 OUTPATIENT PHYSICAL THERAPY LOWER EXTREMITY TREATMENT   Patient Name: Becky Bowers MRN: [id number] DOB:1955/12/14, 68 y.o., female Today's Date: 09/20/2023  END OF SESSION:  PT End of Session - 09/20/23 0800     Visit Number 8    Number of Visits 13    Date for PT Re-Evaluation 10/07/23    Authorization Type [employer]    Authorization Time Period 12 VISITS APPROVED FOR PT 08/23/2023-10/07/2023    Authorization - Visit Number 8    Authorization - Number of Visits 12    Progress Note Due on Visit 10    PT Start Time 0800    PT Stop Time 0848    PT Time Calculation (min) 48 min    Activity Tolerance Patient tolerated treatment well    Behavior During Therapy [facility] for tasks assessed/performed            Past Medical History:  Diagnosis Date   Arthritis    Concussion 09/12/2019   Past Surgical History:  Procedure Laterality Date   APPENDECTOMY  1968   DILATION AND CURETTAGE OF UTERUS  1990   TONSILLECTOMY  1980   Patient Active Problem List   Diagnosis Date Noted   Right hip pain 08/17/2023   Greater trochanteric bursitis, right 05/31/2023   Tear of right hamstring 02/03/2023   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   Elevated LDL cholesterol level 04/14/2021   Polyarthralgia 03/17/2021   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, right trigger thumb 04/24/2018   Numbness of right third and fourth toes 03/19/2018   Primary osteoarthritis of both hips 12/08/2015   Headache 08/12/2010   Lumbar degenerative disc disease 01/07/2008    PCP: Agapito Games, MD   REFERRING PROVIDER: Monica Becton, MD   REFERRING DIAG: M25.551 (ICD-10-CM) - Right hip pain   THERAPY DIAG:  Pain in right hip  Other abnormalities of gait and mobility  Muscle weakness (generalized)  Stiffness of right hip, not elsewhere classified  Rationale for Evaluation and Treatment: Rehabilitation  ONSET DATE: May 2024 with recent worsening  January 2025   SUBJECTIVE:   SUBJECTIVE STATEMENT: Patient reports she continues to have swelling and pain at lateral thigh on R LE.   EVAL: Patient reports she tore her hamstring in May 2024 and wasn't diagnosed until June. She had PT for this injury. The hamstring pain is still there, but not as bad. She is now having worsening pain along the side/front of hip that started about 2 weeks ago. She reports that it was swollen. The hip was so painful that she couldn't abduct the hip. She increased her walking speed right around the time that this pain began in the front/side of the hip, so is unsure if this was the cause of her new area of pain. She saw Dr. Karie Schwalbe and had an injection, which seems to have helped with her pain. Rotational movement or transitioning from sit to stand still bothers the hip. Prior to this pain she was walking daily about 3 miles.   PERTINENT HISTORY: Rt hip injection 08/17/23 PAIN:  Are you having pain? Yes: NPRS scale: 3/10 Pain location: Rt hip Pain description: dull, ache, sharp  Aggravating factors: rotational movement, transitioning from sit to stand Relieving factors: injection,rest  PRECAUTIONS: None  RED FLAGS: None   WEIGHT BEARING RESTRICTIONS: No  FALLS:  Has patient fallen in last 6 months? No  LIVING ENVIRONMENT: Lives with: lives with their spouse Lives in: House/apartment Stairs: Yes:  Internal: 13 steps; on left going up Has following equipment at home: None  OCCUPATION: retired   PLOF: Independent  PATIENT GOALS: "I would love to have no pain and resume back to normal; make it more manageable."   NEXT MD VISIT: 09/28/23  OBJECTIVE:  Note: Objective measures were completed at Evaluation unless otherwise noted.  DIAGNOSTIC FINDINGS:  Rt Hip MRI: IMPRESSION: 1. Right hamstring tendinosis with a full-thickness minimally retracted tear of the biceps femoris tendon origin with peritendinous edema. 2. Moderate volume fluid within the  right ischiogluteal bursa. 3. Mild tendinosis of the contralateral left hamstring origin. 4. Mild tendinosis of the bilateral gluteus medius and minimus tendons without tear. 5. Mild osteoarthritis of the right hip.  PATIENT SURVEYS:  FOTO 56% function; 64% predicted   COGNITION: Overall cognitive status: Within functional limits for tasks assessed     SENSATION: Not tested  EDEMA:  Not inspected   MUSCLE LENGTH: Not tested   POSTURE: No Significant postural limitations  PALPATION: TTP Rt greater trochanter, lateral gluteals  LOWER EXTREMITY ROM:  Active ROM Right eval Left eval 09/06/23 Right   Hip flexion Full   Full   Hip extension     Hip abduction Full pain   Full pain anterior hip/thigh  Hip adduction     Hip internal rotation 30 pain   28 pain  Hip external rotation 28  30  Knee flexion     Knee extension     Ankle dorsiflexion     Ankle plantarflexion     Ankle inversion     Ankle eversion      (Blank rows = not tested)  LOWER EXTREMITY MMT:  MMT Right eval Left eval 09/06/23 Right   Hip flexion 4+ 4   Hip extension 4 5   Hip abduction 3- pain anterior hip 5 3+ pain anterolateral hip  Hip adduction 5    Hip internal rotation 4+ "twinge" 4   Hip external rotation 5 5   Knee flexion 5 5   Knee extension 5 5   Ankle dorsiflexion     Ankle plantarflexion     Ankle inversion     Ankle eversion      (Blank rows = not tested)  LOWER EXTREMITY SPECIAL TESTS:  Not tested   FUNCTIONAL TESTS:  Not tested   GAIT: Distance walked: 20 ft  Assistive device utilized: None Level of assistance: Complete Independence Comments: Trendelenburg Rt  [facility] Adult PT Treatment:                                                DATE: 09/20/2023 Therapeutic Exercise: Isometric lateral weight shift onto R LE  Hip hikes standing in R LE on 4" step 3x10 --> gradually increasing ROM Glute med ball press in wall (R SLS) 10x5" Resisted eccentric single leg deadlift  progression + RTB -> back foot down, kickstand, small range leg lift Supine bridges + block b/w feet & GTB around knees 2x15    [facility] Adult PT Treatment:                                                DATE: 09/18/2023 Therapeutic Exercise: Quadruped: Rock back in [employer]  back in child's pose Prone hip extension from toe tuck position 2x10 (R) --> L x10 to compare Quadruped hydrants + YTB 2x10 Quadruped hip hikes --> opp knee on block Bridges + Blue TB at knees (hip abd) & block b/w feet (stability)  Figure 4 bridge x10 Standing glute med press with ball in wall x10 Standing SLB + slow lateral weight shifting --> added mirror for postural awareness    [facility] Adult PT Treatment:                                                DATE: 09/14/2023 Therapeutic Exercise: Active (R) quad stretch in lunge with R foot propped on chair R leg propped on chair, reaching underneath R thigh with L arm (active stretch) 1/2 kneeling(R leg straight out to side) rock back to heel --> R adductor stretch Standing adductor foot slides (out/in with feet on washcloths) --> 1/10 pain in R thigh Standing glute med ball press at wall (R SLS) x8 R SL divers to chair back --> added RTB at medial ankle (no shoes) R SLS heel raises + tennis ball b/w ankles (no shoe) Double leg heel raises + tennis ball b/w ankles (no shoes) Bridges + GTB at knees (hip abd) & block b/w feet (stability   [facility] Adult PT Treatment:                                                DATE: 09/06/23 Therapeutic Exercise: Resisted hooklying hip abduction red band 2 x 10 Sidelying SLR 2 x 10 RLE; attempted supine SLR in neutral, ER, IR unable due to pain Recumbent bike level 2 x 5 minutes SLS multiple attempts with/without UE support  Updated HEP  Manual Therapy: STM Rt quadriceps, IT band, hip flexor  Skilled palpation of trigger points   Trigger Point Dry Needling  Initial Treatment: Pt instructed on Dry Needling rational,  procedures, and possible side effects. Pt instructed to expect mild to moderate muscle soreness later in the day and/or into the next day.  Pt instructed in methods to reduce muscle soreness. Pt instructed to continue prescribed HEP. Patient verbalized understanding of these instructions and education.   Patient Verbal Consent Given: Yes Education Handout Provided: Yes Muscles Treated: Rt vastus lateralis, intermedius, rectus femoris  Electrical Stimulation Performed: No Treatment Response/Outcome: reduction in tautness noted    PATIENT EDUCATION:  Education details: HEP updated  Person educated: Patient Education method: Explanation, Demonstration, Tactile cues, Verbal cues, and Handouts Education comprehension: verbalized understanding, returned demonstration, verbal cues required, tactile cues required, and needs further education  HOME EXERCISE PROGRAM: Access Code: [email or web address] URL: [email or web address] Date: 09/20/2023 Prepared by: Carlynn Herald  Program Notes Pilates toe corrector: *lengthen through inner leg*1. parallel pull apart2. curling toes --> arch --> ankles; reverse3. fan leg open (ER) --> back to parallel4. 1/2 small circles: fan open, roll over toes/arch/ankle, back to parallel --> revers  Exercises - Standing Quad Stretch in [employer] Position  - 1 x daily - 7 x weekly - 3 sets - 10 reps - Prone Hip Extension  - 1 x daily - 7 x weekly - 2 sets - 10 reps - Quadruped Alternating Leg Extensions  - 1 x  daily - 7 x weekly - 2 sets - 10 reps - Primal Push Up  - 1 x daily - 7 x weekly - 3 sets - 10 reps - Single Leg Heel Raise  - 1 x daily - 7 x weekly - 3 sets - 10 reps - Supine Bridge with Resistance Band  - 1 x daily - 7 x weekly - 3 sets - 10 reps - Hooklying Single Leg Bent Knee Fallouts with Resistance  - 1 x daily - 7 x weekly - 3 sets - 10 reps - Modified Single-Leg Deadlift  - 1 x daily - 7 x weekly - 3 sets - 10 reps - The Diver  - 1 x  daily - 7 x weekly - 3 sets - 10 reps  ASSESSMENT:  CLINICAL IMPRESSION: Glute strengthening continued with focus on single leg stance activities. Noted mild gripping in R hip flexor during standing hip hikes from low step. Discussion with patient on what to modify in pilates and updated HEP.   EVAL: Patient is a 68 y.o. female who was seen today for physical therapy evaluation and treatment for acute on  chronic Rt hip pain. She reports worsening of anterolateral hip pain that began about 2 weeks ago when she increased her walking speed during her typical daily walking routine. Upon assessment she is noted to have normal ROM about the hip reporting pain with active abduction and IR. She has significant hip abductor weakness with trendelenburg gait noted on the RLE. She will benefit from skilled PT to address the above stated deficits in order to optimize her function and assist with overall pain reduction.   OBJECTIVE IMPAIRMENTS: Abnormal gait, decreased activity tolerance, decreased balance, decreased knowledge of condition, difficulty walking, decreased strength, improper body mechanics, and pain.   ACTIVITY LIMITATIONS: carrying, lifting, bending, sitting, stairs, transfers, and locomotion level  PARTICIPATION LIMITATIONS: meal prep, cleaning, laundry, shopping, community activity, yard work, and recreation   PERSONAL FACTORS: Age, Time since onset of injury/illness/exacerbation, and 1 comorbidity: osteoarthritis  are also affecting patient's functional outcome.   REHAB POTENTIAL: Good  CLINICAL DECISION MAKING: Stable/uncomplicated  EVALUATION COMPLEXITY: Low   GOALS: Goals reviewed with patient? Yes  SHORT TERM GOALS: Target date: 09/13/2023  Patient will be independent and compliant with initial HEP.  Baseline: issued at eval.  Goal status: MET  2.  Patient will demonstrate pain free Rt hip AROM to improve ability to complete reaching and bending activity.  Baseline: pain with  IR and abduction AROM Goal status: not met   3.  Patient will be able to maintain SLS on the RLE with no pelvic drop to improve gait stability.  Baseline: Trendelenburg  09/06/23: unable to maintain level pelvis without UE support  Goal status: not met    LONG TERM GOALS: Target date: 10/07/23  Patient will score at least 64% on FOTO to signify clinically meaningful improvement in functional abilities.  Baseline: see above  Goal status: INITIAL  2.  Patient will demonstrate at least 4-/5 Rt hip abductor strength to improve stability with walking.  Baseline: see above Goal status: INITIAL  3.  Patient will report pain at worst rated as </=2/10 to reduce current functional limitations.   Baseline: 5 Goal status: INITIAL   PLAN:  PT FREQUENCY: 2x/week  PT DURATION: 6 weeks  PLANNED INTERVENTIONS: 97164- PT Re-evaluation, 97110-Therapeutic exercises, 97530- Therapeutic activity, [id number]- Neuromuscular re-education, 97535- Self Care, 16109- Manual therapy, [id number]- Gait training, [phone]- Aquatic Therapy, 97014- Electrical stimulation (unattended),  16109- Electrical stimulation (manual), 60454- Ionotophoresis 4mg /ml Dexamethasone, Dry Needling, Cryotherapy, and Moist heat  PLAN FOR NEXT SESSION: Review and progress HEP prn; progress glute med strength as tolerated. Hip strengthening; single leg exercises/activities    Carlynn Herald, PTA 09/20/23 8:49 AM

## 2023-09-21 ENCOUNTER — Other Ambulatory Visit: Payer: Self-pay | Admitting: Obstetrics & Gynecology

## 2023-09-21 ENCOUNTER — Encounter: Payer: Self-pay | Admitting: Obstetrics & Gynecology

## 2023-09-21 MED ORDER — METRONIDAZOLE 500 MG PO TABS: 500.0000 mg | ORAL_TABLET | Freq: Two times a day (BID) | ORAL | 0 refills | Status: DC

## 2023-09-21 MED ORDER — ESTRADIOL 10 MCG VA TABS: ORAL_TABLET | VAGINAL | 6 refills | Status: DC

## 2023-09-21 NOTE — Progress Notes (Signed)
Flagyl for BV.  Needs TOC at next visit if still burning.

## 2023-09-26 ENCOUNTER — Ambulatory Visit: Payer: Medicare PPO

## 2023-09-26 DIAGNOSIS — M6281 Muscle weakness (generalized): Secondary | ICD-10-CM | POA: Diagnosis not present

## 2023-09-26 DIAGNOSIS — M25551 Pain in right hip: Secondary | ICD-10-CM | POA: Diagnosis not present

## 2023-09-26 DIAGNOSIS — R2689 Other abnormalities of gait and mobility: Secondary | ICD-10-CM | POA: Diagnosis not present

## 2023-09-26 DIAGNOSIS — M25651 Stiffness of right hip, not elsewhere classified: Secondary | ICD-10-CM | POA: Diagnosis not present

## 2023-09-26 NOTE — Therapy (Signed)
 OUTPATIENT PHYSICAL THERAPY LOWER EXTREMITY TREATMENT RE-CERTIFICATION Progress Note Reporting Period 08/23/23 to 09/26/23  See note below for Objective Data and Assessment of Progress/Goals.      Patient Name: Becky Bowers MRN: [id number] DOB:[birth year], 68 y.o., female Today's Date: 09/26/2023  END OF SESSION:  PT End of Session - 09/26/23 1012     Visit Number 9    Number of Visits 21    Date for PT Re-Evaluation 11/11/23    Authorization Type [employer]    Authorization Time Period 12 VISITS APPROVED FOR PT 08/23/2023-10/07/2023    Authorization - Visit Number 9    Authorization - Number of Visits 12    Progress Note Due on Visit 19    PT Start Time 1015    PT Stop Time 1059    PT Time Calculation (min) 44 min    Activity Tolerance Patient tolerated treatment well    Behavior During Therapy [facility] for tasks assessed/performed             Past Medical History:  Diagnosis Date   Arthritis    Concussion 09/12/2019   Past Surgical History:  Procedure Laterality Date   APPENDECTOMY  1968   DILATION AND CURETTAGE OF UTERUS  1990   TONSILLECTOMY  1980   Patient Active Problem List   Diagnosis Date Noted   Right hip pain 08/17/2023   Greater trochanteric bursitis, right 05/31/2023   Tear of right hamstring 02/03/2023   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   Elevated LDL cholesterol level 04/14/2021   Polyarthralgia 03/17/2021   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" Still has pain about the Rt lateral hip with palpation and specific movements of the hip mostly with flexion and rotation. Regular walking does not hurt the hip, but has not returned to her previous walking regimen.   EVAL: Patient reports she tore her hamstring in May 2024 and wasn't diagnosed until June. She had PT for this injury. The hamstring pain is still there, but not as bad. She is now having worsening pain along the side/front of hip that started about 2 weeks ago. She reports that it was swollen. The hip was so painful that she couldn't abduct the hip. She increased her walking speed right around the time that this pain began in the front/side of the hip, so is unsure if this was the cause of her new area of pain. She saw Dr. Karie Schwalbe and had an injection, which seems to have helped with her pain. Rotational movement or transitioning from sit to stand still bothers the hip. Prior to this pain she was walking daily about 3 miles.   PERTINENT HISTORY: Rt hip injection 08/17/23 PAIN:  Are you having pain? Yes: NPRS scale: none currently; at worst 5/10 Pain location: Rt hip Pain description: stabbing Aggravating factors: rotational movement, transitioning from sit to stand Relieving  factors: injection,rest  PRECAUTIONS: None  RED FLAGS: None   WEIGHT BEARING RESTRICTIONS: No  FALLS:  Has patient fallen in last 6 months? No  LIVING ENVIRONMENT: Lives with: lives with their spouse Lives in: House/apartment Stairs: Yes: Internal: 13 steps; on left going up Has following equipment at home: None  OCCUPATION: retired   PLOF: Independent  PATIENT GOALS: "I would love to have no pain and resume back to normal; make it more manageable."   NEXT MD VISIT: 09/28/23  OBJECTIVE:  Note: Objective measures  were completed at Evaluation unless otherwise noted.  DIAGNOSTIC FINDINGS:  Rt Hip MRI: IMPRESSION: 1. Right hamstring tendinosis with a full-thickness minimally retracted tear of the biceps femoris tendon origin with peritendinous edema. 2. Moderate volume fluid within the right ischiogluteal bursa. 3. Mild tendinosis of the contralateral left hamstring origin. 4. Mild tendinosis of the bilateral gluteus medius and minimus tendons without tear. 5. Mild osteoarthritis of the right hip.  PATIENT SURVEYS:  FOTO 56% function; 64% predicted  09/26/23: 73% function   COGNITION: Overall cognitive status: Within functional limits for tasks assessed     SENSATION: Not tested  EDEMA:  Not inspected   MUSCLE LENGTH: Not tested   POSTURE: No Significant postural limitations  PALPATION: TTP Rt greater trochanter, lateral gluteals  LOWER EXTREMITY ROM:  Active ROM Right eval Left eval 09/06/23 Right  09/26/23 Right   Hip flexion Full   Full  Full dull pain anterolateral hip with lowering   Hip extension      Hip abduction Full pain   Full pain anterior hip/thigh Full   Hip adduction      Hip internal rotation 30 pain   28 pain 30 twinge at greater trochanter  Hip external rotation 28  30 35 sharp pain lateral thigh   Knee flexion      Knee extension      Ankle dorsiflexion      Ankle plantarflexion      Ankle inversion      Ankle eversion       (Blank rows = not tested)  LOWER EXTREMITY MMT:  MMT Right eval Left eval 09/06/23 Right  09/26/23 Right   Hip flexion 4+ 4  4  Hip extension 4 5  4   Hip abduction 3- pain anterior hip 5 3+ pain anterolateral hip 3+ pain greater trochanter  Hip adduction 5     Hip internal rotation 4+ "twinge" 4  4+ twinge greater trochanter  Hip external rotation 5 5  5   Knee flexion 5 5  In prone 4 biased to bicep femoris   Knee extension 5 5    Ankle dorsiflexion      Ankle plantarflexion      Ankle inversion      Ankle eversion        (Blank rows = not tested)  LOWER EXTREMITY SPECIAL TESTS:  Not tested   FUNCTIONAL TESTS:  Not tested   GAIT: Distance walked: 20 ft  Assistive device utilized: None Level of assistance: Complete Independence Comments: Trendelenburg Rt  [facility] Adult PT Treatment:                                                DATE: 09/26/23 Therapeutic Exercise: HEP review   Therapeutic Activity: Re-assessment to determine overall progress, educating patient on progress towards goals.   Self  Care: Discussed anatomy of the hip Educated on walking program to begin to implement with handout provided  Discussed potential benefit of compression/hip spica for upcoming trip    [facility] Adult PT Treatment:                                                DATE: 09/20/2023 Therapeutic Exercise: Isometric lateral weight shift onto R LE  Hip hikes standing in R LE on 4" step 3x10 --> gradually increasing ROM Glute med ball press in wall (R SLS) 10x5" Resisted eccentric single leg deadlift progression + RTB -> back foot down, kickstand, small range leg lift Supine bridges + block b/w feet & GTB around knees 2x15    [facility] Adult PT Treatment:                                                DATE: 09/18/2023 Therapeutic Exercise: Quadruped: Rock back in [employer] back in child's pose Prone hip extension from toe tuck position 2x10 (R) --> L x10 to compare Quadruped hydrants + YTB 2x10 Quadruped hip hikes --> opp knee on block Bridges + Blue TB at knees (hip abd) & block b/w feet (stability)  Figure 4 bridge x10 Standing glute med press with ball in wall x10 Standing SLB + slow lateral weight shifting --> added mirror for postural awareness    [facility] Adult PT Treatment:                                                DATE: 09/14/2023 Therapeutic Exercise: Active (R) quad stretch in lunge with R foot propped on chair R leg propped on chair, reaching underneath R thigh with L arm (active stretch) 1/2  kneeling(R leg straight out to side) rock back to heel --> R adductor stretch Standing adductor foot slides (out/in with feet on washcloths) --> 1/10 pain in R thigh Standing glute med ball press at wall (R SLS) x8 R SL divers to chair back --> added RTB at medial ankle (no shoes) R SLS heel raises + tennis ball b/w ankles (no shoe) Double leg heel raises + tennis ball b/w ankles (no shoes) Bridges + GTB at knees (hip abd) & block b/w feet (stability    PATIENT EDUCATION:  Education details: see treatment Person educated: Patient Education method: Explanation, Demonstration, and Handouts Education comprehension: verbalized understanding  HOME EXERCISE PROGRAM: Access Code: [email or web address] URL: [email or web address] Date: 09/20/2023 Prepared by: Carlynn Herald  Program Notes Pilates toe corrector: *lengthen through inner leg*1. parallel pull apart2. curling toes --> arch --> ankles; reverse3. fan leg open (ER) --> back to parallel4. 1/2 small circles: fan open, roll over toes/arch/ankle, back to parallel --> revers  Exercises - Standing Quad Stretch in [employer] Position  - 1 x daily - 7 x weekly - 3 sets - 10 reps - Prone Hip Extension  - 1 x daily - 7 x weekly - 2 sets - 10 reps - Quadruped Alternating Leg Extensions  - 1 x daily - 7 x weekly - 2 sets - 10 reps - Primal Push  Up  - 1 x daily - 7 x weekly - 3 sets - 10 reps - Single Leg Heel Raise  - 1 x daily - 7 x weekly - 3 sets - 10 reps - Supine Bridge with Resistance Band  - 1 x daily - 7 x weekly - 3 sets - 10 reps - Hooklying Single Leg Bent Knee Fallouts with Resistance  - 1 x daily - 7 x weekly - 3 sets - 10 reps - Modified Single-Leg Deadlift  - 1 x daily - 7 x weekly - 3 sets - 10 reps - The Diver  - 1 x daily - 7 x weekly - 3 sets - 10 reps  ASSESSMENT:  CLINICAL IMPRESSION: [name] is making steady progress in PT for her Rt hip pain. She does report overall improvement in her hip pain and has  tolerated strength progression fairly well in gravity eliminated positioning. She does exhibit slight improvement in hip abductor strength and ability to maintain level pelvis during SLS (negative trendelenburg). We discussed slowly re-introducing walking as part of her exercise routine with handout provided regarding guidance on initiating this activity. She will benefit from continued skilled PT 2 x week for 6 additional weeks to address lingering hip strength deficits progressing towards standing/functional activity as indicated.   EVAL: Patient is a 68 y.o. female who was seen today for physical therapy evaluation and treatment for acute on  chronic Rt hip pain. She reports worsening of anterolateral hip pain that began about 2 weeks ago when she increased her walking speed during her typical daily walking routine. Upon assessment she is noted to have normal ROM about the hip reporting pain with active abduction and IR. She has significant hip abductor weakness with trendelenburg gait noted on the RLE. She will benefit from skilled PT to address the above stated deficits in order to optimize her function and assist with overall pain reduction.   OBJECTIVE IMPAIRMENTS: Abnormal gait, decreased activity tolerance, decreased balance, decreased knowledge of condition, difficulty walking, decreased strength, improper body mechanics, and pain.   ACTIVITY LIMITATIONS: carrying, lifting, bending, sitting, stairs, transfers, and locomotion level  PARTICIPATION LIMITATIONS: meal prep, cleaning, laundry, shopping, community activity, yard work, and recreation   PERSONAL FACTORS: Age, Time since onset of injury/illness/exacerbation, and 1 comorbidity: osteoarthritis  are also affecting patient's functional outcome.   REHAB POTENTIAL: Good  CLINICAL DECISION MAKING: Stable/uncomplicated  EVALUATION COMPLEXITY: Low   GOALS: Goals reviewed with patient? Yes  SHORT TERM GOALS: Target date:  09/13/2023  Patient will be independent and compliant with initial HEP.  Baseline: issued at eval.  Goal status: MET  2.  Patient will demonstrate pain free Rt hip AROM to improve ability to complete reaching and bending activity.  Baseline: pain with IR and abduction AROM Goal status: partially met   3.  Patient will be able to maintain SLS on the RLE with no pelvic drop to improve gait stability.  Baseline: Trendelenburg  09/06/23: unable to maintain level pelvis without UE support  09/26/23: negative Trendelenburg  Goal status: MET   LONG TERM GOALS: Target date: 11/11/23  Patient will score at least 64% on FOTO to signify clinically meaningful improvement in functional abilities.  Baseline: see above  Goal status: MET  2.  Patient will demonstrate at least 4-/5 Rt hip abductor strength to improve stability with walking.  Baseline: see above Goal status: progressing   3.  Patient will report pain at worst rated as </=2/10 to reduce current functional  limitations.   Baseline: 5 Goal status: progressing    PLAN:  PT FREQUENCY: 2x/week  PT DURATION: 6 weeks  PLANNED INTERVENTIONS: 97164- PT Re-evaluation, 97110-Therapeutic exercises, 97530- Therapeutic activity, [id number]- Neuromuscular re-education, 97535- Self Care, [id number]- Manual therapy, [id number]- Gait training, [id number]- Aquatic Therapy, 97014- Electrical stimulation (unattended), [id number]- Electrical stimulation (manual), [id number]- Ionotophoresis 4mg /ml Dexamethasone, Dry Needling, Cryotherapy, and Moist heat  PLAN FOR NEXT SESSION: Review and progress HEP prn; progress glute med strength as tolerated. Hip strengthening; single leg exercises/activities   Letitia Libra, PT, DPT, ATC 09/26/23 12:42 PM

## 2023-09-28 ENCOUNTER — Ambulatory Visit (INDEPENDENT_AMBULATORY_CARE_PROVIDER_SITE_OTHER): Payer: Medicare PPO | Admitting: Sports Medicine

## 2023-09-28 DIAGNOSIS — S76311A Strain of muscle, fascia and tendon of the posterior muscle group at thigh level, right thigh, initial encounter: Secondary | ICD-10-CM

## 2023-09-28 DIAGNOSIS — M51362 Other intervertebral disc degeneration, lumbar region with discogenic back pain and lower extremity pain: Secondary | ICD-10-CM

## 2023-09-28 DIAGNOSIS — M7061 Trochanteric bursitis, right hip: Secondary | ICD-10-CM | POA: Diagnosis not present

## 2023-09-28 MED ORDER — HYDROCODONE-ACETAMINOPHEN 5-325 MG PO TABS: 1.0000 | ORAL_TABLET | Freq: Three times a day (TID) | ORAL | 0 refills | Status: DC | PRN

## 2023-09-28 NOTE — Assessment & Plan Note (Signed)
 Becky Bowers returns, she is a very pleasant 68 year old female, we last saw her with multifactorial right hip pain, an MRI showed hip abductor tendinopathy, as well as tearing of the hamstring origin with ischiogluteal bursitis. We did a hip abductor/trochanteric bursa injection with ultrasound guidance, her lateral hip pain has resolved. She still has pain deep in the buttock at the ischial tuberosity reproducible with palpation. She does have a trip coming up to [place], I would like to see her back about 2 to 3 weeks before the trip for ischiogluteal bursa injection.

## 2023-09-28 NOTE — Assessment & Plan Note (Signed)
 Becky Bowers also has pain lateral thigh with radiation slightly below the knee with twisting her spine, she has multifactorial disc disease and facet arthropathy, we will add some home conditioning and if insufficient improvement in 4 to 6 weeks we will do an MRI for epidural planning.

## 2023-09-28 NOTE — Progress Notes (Signed)
    Procedures performed today:    None.  Independent interpretation of notes and tests performed by another provider:   None.  Brief History, Exam, Impression, and Recommendations:    Greater trochanteric bursitis, right Videl returns, she is a very pleasant 68 year old female, we last saw her with multifactorial right hip pain, an MRI showed hip abductor tendinopathy, as well as tearing of the hamstring origin with ischiogluteal bursitis. We did a hip abductor/trochanteric bursa injection with ultrasound guidance, her lateral hip pain has resolved. She still has pain deep in the buttock at the ischial tuberosity reproducible with palpation. She does have a trip coming up to [place], I would like to see her back about 2 to 3 weeks before the trip for ischiogluteal bursa injection.  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 Please see below, MRI does confirm right hamstring tear with ischiogluteal bursitis, current symptomatology is referable to the ischial tuberosity. She has a trip coming up, we will do a shot 2 to 3 weeks before the trip. I am going to give her some hydrocodone to take when on her trip if needed.  Lumbar degenerative disc disease Kristinia also has pain lateral thigh with radiation slightly below the knee with twisting her spine, she has multifactorial disc disease and facet arthropathy, we will add some home conditioning and if insufficient improvement in 4 to 6 weeks we will do an MRI for epidural planning.    ____________________________________________ Ihor Austin. Benjamin Stain, M.D., ABFM., CAQSM., AME. Primary Care and Sports Medicine [facility]  Adjunct Professor of Family Medicine  [employer]  Restaurant manager, fast food

## 2023-09-28 NOTE — Assessment & Plan Note (Addendum)
 Please see below, MRI does confirm right hamstring tear with ischiogluteal bursitis, current symptomatology is referable to the ischial tuberosity. She has a trip coming up, we will do a shot 2 to 3 weeks before the trip. I am going to give her some hydrocodone to take when on her trip if needed.

## 2023-10-02 ENCOUNTER — Other Ambulatory Visit (HOSPITAL_COMMUNITY)
Admission: RE | Admit: 2023-10-02 | Discharge: 2023-10-02 | Disposition: A | Discharge status: Home / Self Care | Payer: Self-pay | Source: Ambulatory Visit | Attending: Oncology | Admitting: Oncology

## 2023-10-03 ENCOUNTER — Ambulatory Visit: Payer: Medicare PPO

## 2023-10-03 DIAGNOSIS — M25551 Pain in right hip: Secondary | ICD-10-CM | POA: Diagnosis not present

## 2023-10-03 DIAGNOSIS — R2689 Other abnormalities of gait and mobility: Secondary | ICD-10-CM

## 2023-10-03 DIAGNOSIS — M25651 Stiffness of right hip, not elsewhere classified: Secondary | ICD-10-CM

## 2023-10-03 DIAGNOSIS — M6281 Muscle weakness (generalized): Secondary | ICD-10-CM

## 2023-10-03 NOTE — Therapy (Signed)
 OUTPATIENT PHYSICAL THERAPY LOWER EXTREMITY TREATMENT    Patient Name: Becky Bowers MRN: [id number] DOB:1956-02-20, 68 y.o., female Today's Date: 10/03/2023  END OF SESSION:  PT End of Session - 10/03/23 0934     Visit Number 10    Number of Visits 21    Date for PT Re-Evaluation 11/11/23    Authorization Type [employer]    Authorization Time Period 12 VISITS APPROVED FOR PT 08/23/2023-10/07/2023    Authorization - Visit Number 10    Authorization - Number of Visits 12    Progress Note Due on Visit 19    PT Start Time 0932    PT Stop Time 1014    PT Time Calculation (min) 42 min    Activity Tolerance Patient tolerated treatment well    Behavior During Therapy [facility] for tasks assessed/performed             Past Medical History:  Diagnosis Date   Arthritis    Concussion 09/12/2019   Past Surgical History:  Procedure Laterality Date   APPENDECTOMY  1968   DILATION AND CURETTAGE OF UTERUS  1990   TONSILLECTOMY  1980   Patient Active Problem List   Diagnosis Date Noted   Right hip pain 08/17/2023   Greater trochanteric bursitis, right 05/31/2023   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 02/03/2023   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   Elevated LDL cholesterol level 04/14/2021   Polyarthralgia 03/17/2021   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, right trigger thumb 04/24/2018   Numbness of right third and fourth toes 03/19/2018   Primary osteoarthritis of both hips 12/08/2015   Headache 08/12/2010   Lumbar degenerative disc disease 01/07/2008    PCP: Agapito Games, MD   REFERRING PROVIDER: Monica Becton, MD   REFERRING DIAG: M25.551 (ICD-10-CM) - Right hip pain   THERAPY DIAG:  Pain in right hip  Other abnormalities of gait and mobility  Muscle weakness (generalized)  Stiffness of right hip, not elsewhere classified  Rationale for Evaluation and Treatment: Rehabilitation  ONSET DATE:  May 2024 with recent worsening January 2025   SUBJECTIVE:   SUBJECTIVE STATEMENT: Patient reports she has been walking indoor track and recumbent bike at gym. Patient is interested in trying aquatic class at pool but what's some recommendations on how to start.   EVAL: Patient reports she tore her hamstring in May 2024 and wasn't diagnosed until June. She had PT for this injury. The hamstring pain is still there, but not as bad. She is now having worsening pain along the side/front of hip that started about 2 weeks ago. She reports that it was swollen. The hip was so painful that she couldn't abduct the hip. She increased her walking speed right around the time that this pain began in the front/side of the hip, so is unsure if this was the cause of her new area of pain. She saw Dr. Karie Schwalbe and had an injection, which seems to have helped with her pain. Rotational movement or transitioning from sit to stand still bothers the hip. Prior to this pain she was walking daily about 3 miles.   PERTINENT HISTORY: Rt hip injection 08/17/23 PAIN:  Are you having pain? Yes: NPRS scale: none currently; at worst 5/10 Pain location: Rt hip Pain description: stabbing Aggravating factors: rotational movement, transitioning from sit to stand Relieving factors: injection,rest  PRECAUTIONS: None  RED FLAGS: None   WEIGHT BEARING RESTRICTIONS: No  FALLS:  Has  patient fallen in last 6 months? No  LIVING ENVIRONMENT: Lives with: lives with their spouse Lives in: House/apartment Stairs: Yes: Internal: 13 steps; on left going up Has following equipment at home: None  OCCUPATION: retired   PLOF: Independent  PATIENT GOALS: "I would love to have no pain and resume back to normal; make it more manageable."   NEXT MD VISIT: 09/28/23  OBJECTIVE:  Note: Objective measures were completed at Evaluation unless otherwise noted.  DIAGNOSTIC FINDINGS:  Rt Hip MRI: IMPRESSION: 1. Right hamstring tendinosis with a  full-thickness minimally retracted tear of the biceps femoris tendon origin with peritendinous edema. 2. Moderate volume fluid within the right ischiogluteal bursa. 3. Mild tendinosis of the contralateral left hamstring origin. 4. Mild tendinosis of the bilateral gluteus medius and minimus tendons without tear. 5. Mild osteoarthritis of the right hip.  PATIENT SURVEYS:  FOTO 56% function; 64% predicted  09/26/23: 73% function   COGNITION: Overall cognitive status: Within functional limits for tasks assessed     SENSATION: Not tested  EDEMA:  Not inspected   MUSCLE LENGTH: Not tested   POSTURE: No Significant postural limitations  PALPATION: TTP Rt greater trochanter, lateral gluteals  LOWER EXTREMITY ROM:  Active ROM Right eval Left eval 09/06/23 Right  09/26/23 Right   Hip flexion Full   Full  Full dull pain anterolateral hip with lowering   Hip extension      Hip abduction Full pain   Full pain anterior hip/thigh Full   Hip adduction      Hip internal rotation 30 pain   28 pain 30 twinge at greater trochanter  Hip external rotation 28  30 35 sharp pain lateral thigh   Knee flexion      Knee extension      Ankle dorsiflexion      Ankle plantarflexion      Ankle inversion      Ankle eversion       (Blank rows = not tested)  LOWER EXTREMITY MMT:  MMT Right eval Left eval 09/06/23 Right  09/26/23 Right   Hip flexion 4+ 4  4  Hip extension 4 5  4   Hip abduction 3- pain anterior hip 5 3+ pain anterolateral hip 3+ pain greater trochanter  Hip adduction 5     Hip internal rotation 4+ "twinge" 4  4+ twinge greater trochanter  Hip external rotation 5 5  5   Knee flexion 5 5  In prone 4 biased to bicep femoris   Knee extension 5 5    Ankle dorsiflexion      Ankle plantarflexion      Ankle inversion      Ankle eversion       (Blank rows = not tested)  LOWER EXTREMITY SPECIAL TESTS:  Not tested   FUNCTIONAL TESTS:  Not tested   GAIT: Distance walked: 20  ft  Assistive device utilized: None Level of assistance: Complete Independence Comments: Trendelenburg Rt  [facility] Adult PT Treatment:                                                DATE: 10/03/2023 Therapeutic Exercise: Treadmill: 2.0 mph, 0% incline x 5 min Glute med ball press into ball Neuromuscular re-ed: Foam beam: Mini squats (R) step on for single leg balance, step off to solid ground: forward & lateral shifting Tandem walking forward &  backward --> side stepping (R) divers --> focus on pelvic alignment & glute activation (R) SLS front/side/back sliders --> progressed to half circles Self Care: Aquatic exercises and how to progress    [facility] Adult PT Treatment:                                                DATE: 09/26/23 Therapeutic Exercise: HEP review   Therapeutic Activity: Re-assessment to determine overall progress, educating patient on progress towards goals.   Self Care: Discussed anatomy of the hip Educated on walking program to begin to implement with handout provided  Discussed potential benefit of compression/hip spica for upcoming trip    [facility] Adult PT Treatment:                                                DATE: 09/20/2023 Therapeutic Exercise: Isometric lateral weight shift onto R LE  Hip hikes standing in R LE on 4" step 3x10 --> gradually increasing ROM Glute med ball press in wall (R SLS) 10x5" Resisted eccentric single leg deadlift progression + RTB -> back foot down, kickstand, small range leg lift Supine bridges + block b/w feet & GTB around knees 2x15    [facility] Adult PT Treatment:                                                DATE: 09/18/2023 Therapeutic Exercise: Quadruped: Rock back in [employer] back in child's pose Prone hip extension from toe tuck position 2x10 (R) --> L x10 to compare Quadruped hydrants + YTB 2x10 Quadruped hip hikes --> opp knee on block Bridges + Blue TB at knees (hip abd) & block b/w feet (stability)  Figure 4  bridge x10 Standing glute med press with ball in wall x10 Standing SLB + slow lateral weight shifting --> added mirror for postural awareness    [facility] Adult PT Treatment:                                                DATE: 09/14/2023 Therapeutic Exercise: Active (R) quad stretch in lunge with R foot propped on chair R leg propped on chair, reaching underneath R thigh with L arm (active stretch) 1/2 kneeling(R leg straight out to side) rock back to heel --> R adductor stretch Standing adductor foot slides (out/in with feet on washcloths) --> 1/10 pain in R thigh Standing glute med ball press at wall (R SLS) x8 R SL divers to chair back --> added RTB at medial ankle (no shoes) R SLS heel raises + tennis ball b/w ankles (no shoe) Double leg heel raises + tennis ball b/w ankles (no shoes) Bridges + GTB at knees (hip abd) & block b/w feet (stability    PATIENT EDUCATION:  Education details: see treatment Person educated: Patient Education method: Explanation, Demonstration, and Handouts Education comprehension: verbalized understanding  HOME EXERCISE PROGRAM: Access Code: [email or web address] URL: [email or web address] Date: 09/20/2023 Prepared by: Carlynn Herald  Program Notes Pilates toe corrector: *lengthen through inner leg*1. parallel pull apart2. curling toes --> arch --> ankles; reverse3. fan leg open (ER) --> back to parallel4. 1/2 small circles: fan open, roll over toes/arch/ankle, back to parallel --> revers  Exercises - Standing Quad Stretch in [employer] Position  - 1 x daily - 7 x weekly - 3 sets - 10 reps - Prone Hip Extension  - 1 x daily - 7 x weekly - 2 sets - 10 reps - Quadruped Alternating Leg Extensions  - 1 x daily - 7 x weekly - 2 sets - 10 reps - Primal Push Up  - 1 x daily - 7 x weekly - 3 sets - 10 reps - Single Leg Heel Raise  - 1 x daily - 7 x weekly - 3 sets - 10 reps - Supine Bridge with Resistance Band  - 1 x daily - 7 x weekly - 3 sets - 10  reps - Hooklying Single Leg Bent Knee Fallouts with Resistance  - 1 x daily - 7 x weekly - 3 sets - 10 reps - Modified Single-Leg Deadlift  - 1 x daily - 7 x weekly - 3 sets - 10 reps - The Diver  - 1 x daily - 7 x weekly - 3 sets - 10 reps  ASSESSMENT:  CLINICAL IMPRESSION: Session focused on glute activation in single leg stance and dynamic activities on foam beam. Patient had difficulty maintaining neutral pelvic alignment with divers exercise and required verbal cues with patient demonstrating good application. Increased glute activation noted during single leg slider exercise. Treadmill warm-up and compliant surfaces incorporated to progress patient in preparation for upcoming hiking trip; will follow-up on how patient responded at next PT session.  EVAL: Patient is a 68 y.o. female who was seen today for physical therapy evaluation and treatment for acute on  chronic Rt hip pain. She reports worsening of anterolateral hip pain that began about 2 weeks ago when she increased her walking speed during her typical daily walking routine. Upon assessment she is noted to have normal ROM about the hip reporting pain with active abduction and IR. She has significant hip abductor weakness with trendelenburg gait noted on the RLE. She will benefit from skilled PT to address the above stated deficits in order to optimize her function and assist with overall pain reduction.   OBJECTIVE IMPAIRMENTS: Abnormal gait, decreased activity tolerance, decreased balance, decreased knowledge of condition, difficulty walking, decreased strength, improper body mechanics, and pain.   ACTIVITY LIMITATIONS: carrying, lifting, bending, sitting, stairs, transfers, and locomotion level  PARTICIPATION LIMITATIONS: meal prep, cleaning, laundry, shopping, community activity, yard work, and recreation   PERSONAL FACTORS: Age, Time since onset of injury/illness/exacerbation, and 1 comorbidity: osteoarthritis  are also affecting  patient's functional outcome.   REHAB POTENTIAL: Good  CLINICAL DECISION MAKING: Stable/uncomplicated  EVALUATION COMPLEXITY: Low   GOALS: Goals reviewed with patient? Yes  SHORT TERM GOALS: Target date: 09/13/2023  Patient will be independent and compliant with initial HEP.  Baseline: issued at eval.  Goal status: MET  2.  Patient will demonstrate pain free Rt hip AROM to improve ability to complete reaching and bending activity.  Baseline: pain with IR and abduction AROM Goal status: partially met   3.  Patient will be able to maintain SLS on the RLE with no pelvic drop to improve gait stability.  Baseline: Trendelenburg  09/06/23: unable to maintain level pelvis without UE support  09/26/23: negative Trendelenburg  Goal status: MET   LONG TERM GOALS: Target date: 11/11/23  Patient will score at least 64% on FOTO to signify clinically meaningful improvement in functional abilities.  Baseline: see above  Goal status: MET  2.  Patient will demonstrate at least 4-/5 Rt hip abductor strength to improve stability with walking.  Baseline: see above Goal status: progressing   3.  Patient will report pain at worst rated as </=2/10 to reduce current functional limitations.   Baseline: 5 Goal status: progressing    PLAN:  PT FREQUENCY: 2x/week  PT DURATION: 6 weeks  PLANNED INTERVENTIONS: 97164- PT Re-evaluation, 97110-Therapeutic exercises, 97530- Therapeutic activity, [id number]- Neuromuscular re-education, 97535- Self Care, [id number]- Manual therapy, [id number]- Gait training, [id number]- Aquatic Therapy, 97014- Electrical stimulation (unattended), [id number]- Electrical stimulation (manual), [id number]- Ionotophoresis 4mg /ml Dexamethasone, Dry Needling, Cryotherapy, and Moist heat  PLAN FOR NEXT SESSION: Review and progress HEP prn; progress glute med strength as tolerated. Hip strengthening; single leg exercises/activities   Carlynn Herald, PTA 10/03/23 10:17 AM

## 2023-10-06 ENCOUNTER — Ambulatory Visit: Payer: Medicare PPO

## 2023-10-06 DIAGNOSIS — R2689 Other abnormalities of gait and mobility: Secondary | ICD-10-CM | POA: Diagnosis not present

## 2023-10-06 DIAGNOSIS — M25651 Stiffness of right hip, not elsewhere classified: Secondary | ICD-10-CM

## 2023-10-06 DIAGNOSIS — M25551 Pain in right hip: Secondary | ICD-10-CM

## 2023-10-06 DIAGNOSIS — M6281 Muscle weakness (generalized): Secondary | ICD-10-CM

## 2023-10-06 NOTE — Therapy (Signed)
 OUTPATIENT PHYSICAL THERAPY LOWER EXTREMITY TREATMENT    Patient Name: Becky Bowers MRN: [id number] DOB:08-25-1955, 68 y.o., female Today's Date: 10/06/2023  END OF SESSION:  PT End of Session - 10/06/23 0800     Visit Number 11    Number of Visits 21    Date for PT Re-Evaluation 11/11/23    Authorization Type [employer]    Authorization Time Period 12 VISITS APPROVED FOR PT 08/23/2023-10/07/2023    Authorization - Visit Number 11    Authorization - Number of Visits 12    PT Start Time 0800    PT Stop Time 0842    PT Time Calculation (min) 42 min    Activity Tolerance Patient tolerated treatment well    Behavior During Therapy [facility] for tasks assessed/performed            Past Medical History:  Diagnosis Date   Arthritis    Concussion 09/12/2019   Past Surgical History:  Procedure Laterality Date   APPENDECTOMY  1968   DILATION AND CURETTAGE OF UTERUS  1990   TONSILLECTOMY  1980   Patient Active Problem List   Diagnosis Date Noted   Right hip pain 08/17/2023   Greater trochanteric bursitis, right 05/31/2023   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 02/03/2023   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   Elevated LDL cholesterol level 04/14/2021   Polyarthralgia 03/17/2021   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, right trigger thumb 04/24/2018   Numbness of right third and fourth toes 03/19/2018   Primary osteoarthritis of both hips 12/08/2015   Headache 08/12/2010   Lumbar degenerative disc disease 01/07/2008    PCP: Agapito Games, MD   REFERRING PROVIDER: Monica Becton, MD   REFERRING DIAG: M25.551 (ICD-10-CM) - Right hip pain   THERAPY DIAG:  Pain in right hip  Other abnormalities of gait and mobility  Muscle weakness (generalized)  Stiffness of right hip, not elsewhere classified  Rationale for Evaluation and Treatment: Rehabilitation  ONSET DATE: May 2024 with recent worsening  January 2025   SUBJECTIVE:   SUBJECTIVE STATEMENT: Patient reports she felt better after last visit, continues to have "random" shooting pain down lateral thigh. Patient states she has noticed less swelling and hasn't needed ice since Saturday.   EVAL: Patient reports she tore her hamstring in May 2024 and wasn't diagnosed until June. She had PT for this injury. The hamstring pain is still there, but not as bad. She is now having worsening pain along the side/front of hip that started about 2 weeks ago. She reports that it was swollen. The hip was so painful that she couldn't abduct the hip. She increased her walking speed right around the time that this pain began in the front/side of the hip, so is unsure if this was the cause of her new area of pain. She saw Dr. Karie Schwalbe and had an injection, which seems to have helped with her pain. Rotational movement or transitioning from sit to stand still bothers the hip. Prior to this pain she was walking daily about 3 miles.   PERTINENT HISTORY: Rt hip injection 08/17/23 PAIN:  Are you having pain? Yes: NPRS scale: none currently; at worst 5/10 Pain location: Rt hip Pain description: stabbing Aggravating factors: rotational movement, transitioning from sit to stand Relieving factors: injection,rest  PRECAUTIONS: None  RED FLAGS: None   WEIGHT BEARING RESTRICTIONS: No  FALLS:  Has patient fallen in last 6 months? No  LIVING ENVIRONMENT:  Lives with: lives with their spouse Lives in: House/apartment Stairs: Yes: Internal: 13 steps; on left going up Has following equipment at home: None  OCCUPATION: retired   PLOF: Independent  PATIENT GOALS: "I would love to have no pain and resume back to normal; make it more manageable."   NEXT MD VISIT: 09/28/23  OBJECTIVE:  Note: Objective measures were completed at Evaluation unless otherwise noted.  DIAGNOSTIC FINDINGS:  Rt Hip MRI: IMPRESSION: 1. Right hamstring tendinosis with a full-thickness  minimally retracted tear of the biceps femoris tendon origin with peritendinous edema. 2. Moderate volume fluid within the right ischiogluteal bursa. 3. Mild tendinosis of the contralateral left hamstring origin. 4. Mild tendinosis of the bilateral gluteus medius and minimus tendons without tear. 5. Mild osteoarthritis of the right hip.  PATIENT SURVEYS:  FOTO 56% function; 64% predicted  09/26/23: 73% function   COGNITION: Overall cognitive status: Within functional limits for tasks assessed     SENSATION: Not tested  EDEMA:  Not inspected   MUSCLE LENGTH: Not tested   POSTURE: No Significant postural limitations  PALPATION: TTP Rt greater trochanter, lateral gluteals  LOWER EXTREMITY ROM:  Active ROM Right eval Left eval 09/06/23 Right  09/26/23 Right   Hip flexion Full   Full  Full dull pain anterolateral hip with lowering   Hip extension      Hip abduction Full pain   Full pain anterior hip/thigh Full   Hip adduction      Hip internal rotation 30 pain   28 pain 30 twinge at greater trochanter  Hip external rotation 28  30 35 sharp pain lateral thigh   Knee flexion      Knee extension      Ankle dorsiflexion      Ankle plantarflexion      Ankle inversion      Ankle eversion       (Blank rows = not tested)  LOWER EXTREMITY MMT:  MMT Right eval Left eval 09/06/23 Right  09/26/23 Right   Hip flexion 4+ 4  4  Hip extension 4 5  4   Hip abduction 3- pain anterior hip 5 3+ pain anterolateral hip 3+ pain greater trochanter  Hip adduction 5     Hip internal rotation 4+ "twinge" 4  4+ twinge greater trochanter  Hip external rotation 5 5  5   Knee flexion 5 5  In prone 4 biased to bicep femoris   Knee extension 5 5    Ankle dorsiflexion      Ankle plantarflexion      Ankle inversion      Ankle eversion       (Blank rows = not tested)  LOWER EXTREMITY SPECIAL TESTS:  Not tested   FUNCTIONAL TESTS:  Not tested   GAIT: Distance walked: 20 ft  Assistive  device utilized: None Level of assistance: Complete Independence Comments: Trendelenburg Rt   [facility] Adult PT Treatment:                                                DATE: 10/06/2023 Therapeutic Exercise: Treadmill: 2.0 mph, 0% incline x 4 min --> 2% incline x Runners step up 6" step x10 + HHAx1 6" lateral step down heel taps x10 5# pulleys --> trunk rotation 1/2 kneeling paloff press --> double blue TB Divers (R) --> small range with pelvic  rotation towards R for glute activation Neuromuscular re-ed: Foam beam: Mini squats x10 Alternating step around turns Fwd/bkwd tandem walking Hip hikes standing on R LE --> 4" step, L foot reach on/off block Slow march --> high knee on R LE High knee lateral marching   [facility] Adult PT Treatment:                                                DATE: 10/03/2023 Therapeutic Exercise: Treadmill: 2.0 mph, 0% incline x 5 min Glute med ball press into ball Neuromuscular re-ed: Foam beam: Mini squats (R) step on for single leg balance, step off to solid ground: forward & lateral shifting Tandem walking forward & backward --> side stepping (R) divers --> focus on pelvic alignment & glute activation (R) SLS front/side/back sliders --> progressed to half circles Self Care: Aquatic exercises and how to progress    [facility] Adult PT Treatment:                                                DATE: 09/26/23 Therapeutic Exercise: HEP review  Therapeutic Activity: Re-assessment to determine overall progress, educating patient on progress towards goals.  Self Care: Discussed anatomy of the hip Educated on walking program to begin to implement with handout provided  Discussed potential benefit of compression/hip spica for upcoming trip    PATIENT EDUCATION:  Education details: see treatment Person educated: Patient Education method: Explanation, Demonstration, and Handouts Education comprehension: verbalized understanding  HOME EXERCISE  PROGRAM: Access Code: [email or web address] URL: [email or web address] Date: 10/06/2023 Prepared by: Carlynn Herald  Program Notes Pilates toe corrector: *lengthen through inner leg*1. parallel pull apart2. curling toes --> arch --> ankles; reverse3. fan leg open (ER) --> back to parallel4. 1/2 small circles: fan open, roll over toes/arch/ankle, back to parallel --> revers  Exercises - Standing Quad Stretch in [employer] Position  - 1 x daily - 7 x weekly - 3 sets - 10 reps - Prone Hip Extension  - 1 x daily - 7 x weekly - 2 sets - 10 reps - Quadruped Alternating Leg Extensions  - 1 x daily - 7 x weekly - 2 sets - 10 reps - Primal Push Up  - 1 x daily - 7 x weekly - 3 sets - 10 reps - Single Leg Heel Raise  - 1 x daily - 7 x weekly - 3 sets - 10 reps - Supine Bridge with Resistance Band  - 1 x daily - 7 x weekly - 3 sets - 10 reps - Hooklying Single Leg Bent Knee Fallouts with Resistance  - 1 x daily - 7 x weekly - 3 sets - 10 reps - Modified Single-Leg Deadlift  - 1 x daily - 7 x weekly - 3 sets - 10 reps - The Diver  - 1 x daily - 7 x weekly - 3 sets - 10 reps - Half Kneeling Anti-Rotation Press With Shoulder Flexion and Anchored Resistance  - 1 x daily - 7 x weekly - 3 sets - 10 reps - Walking March  - 1 x daily - 7 x weekly - 3 sets - 10 reps - Hip Hiking on Step  - 1 x daily - 7 x  weekly - 3 sets - 10 reps  ASSESSMENT:  CLINICAL IMPRESSION: Dynamic balance exercises progressed with focus on weight shifting and single leg stabilization. Improved R glute stabilization noted in single leg stance, however not equal to L side. Noted swelling at lateral R hip down lateral thigh. Patient will continue to benefit from skilled therapy to progress glute  strength and stability.    EVAL: Patient is a 68 y.o. female who was seen today for physical therapy evaluation and treatment for acute on  chronic Rt hip pain. She reports worsening of anterolateral hip pain that began about 2  weeks ago when she increased her walking speed during her typical daily walking routine. Upon assessment she is noted to have normal ROM about the hip reporting pain with active abduction and IR. She has significant hip abductor weakness with trendelenburg gait noted on the RLE. She will benefit from skilled PT to address the above stated deficits in order to optimize her function and assist with overall pain reduction.   OBJECTIVE IMPAIRMENTS: Abnormal gait, decreased activity tolerance, decreased balance, decreased knowledge of condition, difficulty walking, decreased strength, improper body mechanics, and pain.   ACTIVITY LIMITATIONS: carrying, lifting, bending, sitting, stairs, transfers, and locomotion level  PARTICIPATION LIMITATIONS: meal prep, cleaning, laundry, shopping, community activity, yard work, and recreation   PERSONAL FACTORS: Age, Time since onset of injury/illness/exacerbation, and 1 comorbidity: osteoarthritis  are also affecting patient's functional outcome.   REHAB POTENTIAL: Good  CLINICAL DECISION MAKING: Stable/uncomplicated  EVALUATION COMPLEXITY: Low   GOALS: Goals reviewed with patient? Yes  SHORT TERM GOALS: Target date: 09/13/2023  Patient will be independent and compliant with initial HEP.  Baseline: issued at eval.  Goal status: MET  2.  Patient will demonstrate pain free Rt hip AROM to improve ability to complete reaching and bending activity.  Baseline: pain with IR and abduction AROM Goal status: partially met   3.  Patient will be able to maintain SLS on the RLE with no pelvic drop to improve gait stability.  Baseline: Trendelenburg  09/06/23: unable to maintain level pelvis without UE support  09/26/23: negative Trendelenburg  Goal status: MET   LONG TERM GOALS: Target date: 11/11/23  Patient will score at least 64% on FOTO to signify clinically meaningful improvement in functional abilities.  Baseline: see above  Goal status: MET  2.   Patient will demonstrate at least 4-/5 Rt hip abductor strength to improve stability with walking.  Baseline: see above Goal status: progressing   3.  Patient will report pain at worst rated as </=2/10 to reduce current functional limitations.   Baseline: 5 Goal status: progressing    PLAN:  PT FREQUENCY: 2x/week  PT DURATION: 6 weeks  PLANNED INTERVENTIONS: 97164- PT Re-evaluation, 97110-Therapeutic exercises, 97530- Therapeutic activity, [id number]- Neuromuscular re-education, 97535- Self Care, [id number]- Manual therapy, [id number]- Gait training, [id number]- Aquatic Therapy, 97014- Electrical stimulation (unattended), [id number]- Electrical stimulation (manual), [id number]- Ionotophoresis 4mg /ml Dexamethasone, Dry Needling, Cryotherapy, and Moist heat  PLAN FOR NEXT SESSION: Review and progress HEP prn; progress glute med strength as tolerated. Hip strengthening; single leg exercises/activities   Carlynn Herald, PTA 10/06/23 8:46 AM

## 2023-10-09 ENCOUNTER — Encounter: Payer: Self-pay | Admitting: Obstetrics & Gynecology

## 2023-10-09 ENCOUNTER — Other Ambulatory Visit (HOSPITAL_COMMUNITY)
Admission: RE | Admit: 2023-10-09 | Discharge: 2023-10-09 | Disposition: A | Discharge status: Home / Self Care | Source: Ambulatory Visit | Attending: Obstetrics & Gynecology | Admitting: Obstetrics & Gynecology

## 2023-10-09 ENCOUNTER — Ambulatory Visit: Payer: Medicare PPO | Admitting: Obstetrics & Gynecology

## 2023-10-09 VITALS — BP 109/72 | HR 71 | Ht 64.0 in | Wt 124.0 lb

## 2023-10-09 DIAGNOSIS — M6289 Other specified disorders of muscle: Secondary | ICD-10-CM | POA: Diagnosis not present

## 2023-10-09 DIAGNOSIS — B9689 Other specified bacterial agents as the cause of diseases classified elsewhere: Secondary | ICD-10-CM | POA: Insufficient documentation

## 2023-10-09 DIAGNOSIS — N760 Acute vaginitis: Secondary | ICD-10-CM | POA: Diagnosis not present

## 2023-10-09 DIAGNOSIS — N94819 Vulvodynia, unspecified: Secondary | ICD-10-CM

## 2023-10-09 DIAGNOSIS — R102 Pelvic and perineal pain: Secondary | ICD-10-CM

## 2023-10-09 NOTE — Progress Notes (Signed)
   Subjective:    Patient ID: Becky Bowers, female    DOB: 1955-09-29, 68 y.o.   MRN: [id number]  HPI  68 year old female presents after treatment of BV and using a clobetasol taper.  Patient still having burning and pain.  She also had an episode of loose stools and she felt like her anus was also inflamed.  It is better now.  She is having And a visible rash and was seen by her dermatologist.  He said this is what happens when you have a lot of inflammation internally and there is a skin manifestation.  This rash is on her back and she has episodes of intense itching.  Review of Systems  Constitutional: Negative.   Respiratory: Negative.    Cardiovascular: Negative.   Gastrointestinal:  Positive for diarrhea. Negative for abdominal pain and anal bleeding.  Genitourinary:  Positive for vaginal pain. Negative for vaginal bleeding and vaginal discharge.  Musculoskeletal:  Positive for back pain.       Bilateral hip pain       Objective:   Physical Exam Vitals reviewed.  Constitutional:      General: She is not in acute distress.    Appearance: She is well-developed.  HENT:     Head: Normocephalic and atraumatic.  Eyes:     Conjunctiva/sclera: Conjunctivae normal.  Cardiovascular:     Rate and Rhythm: Normal rate.  Pulmonary:     Effort: Pulmonary effort is normal.  Genitourinary:    Comments: Tanner V Vulva:  Mild redness near skene's glands Vagina:  Pink, no lesions, no discharge, no blood; incrase pain and tone on left vaginal side wall.  Well estrogenized.   Skin:    General: Skin is warm and dry.  Neurological:     Mental Status: She is alert and oriented to person, place, and time.  Psychiatric:        Mood and Affect: Mood normal.    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

## 2023-10-10 ENCOUNTER — Ambulatory Visit: Payer: Medicare PPO | Attending: Sports Medicine

## 2023-10-10 DIAGNOSIS — R2689 Other abnormalities of gait and mobility: Secondary | ICD-10-CM | POA: Insufficient documentation

## 2023-10-10 DIAGNOSIS — M5459 Other low back pain: Secondary | ICD-10-CM | POA: Insufficient documentation

## 2023-10-10 DIAGNOSIS — M6281 Muscle weakness (generalized): Secondary | ICD-10-CM | POA: Diagnosis not present

## 2023-10-10 DIAGNOSIS — M25551 Pain in right hip: Secondary | ICD-10-CM | POA: Diagnosis not present

## 2023-10-10 DIAGNOSIS — M25651 Stiffness of right hip, not elsewhere classified: Secondary | ICD-10-CM | POA: Diagnosis not present

## 2023-10-10 LAB — CERVICOVAGINAL ANCILLARY ONLY
Bacterial Vaginitis (gardnerella): POSITIVE — AB
Candida Glabrata: NEGATIVE
Candida Vaginitis: NEGATIVE
Comment: NEGATIVE
Comment: NEGATIVE
Comment: NEGATIVE

## 2023-10-10 NOTE — Therapy (Signed)
 OUTPATIENT PHYSICAL THERAPY LOWER EXTREMITY TREATMENT    Patient Name: Becky Bowers MRN: [id number] DOB:Dec 25, 1955, 68 y.o., female Today's Date: 10/10/2023  END OF SESSION:  PT End of Session - 10/10/23 1146     Visit Number 12    Number of Visits 21    Date for PT Re-Evaluation 11/11/23    Authorization Type [employer]    Authorization Time Period auth pending    Progress Note Due on Visit 19    PT Start Time 1146    PT Stop Time 1232    PT Time Calculation (min) 46 min    Activity Tolerance Patient tolerated treatment well    Behavior During Therapy [facility] for tasks assessed/performed             Past Medical History:  Diagnosis Date   Arthritis    Concussion 09/12/2019   Past Surgical History:  Procedure Laterality Date   APPENDECTOMY  1968   DILATION AND CURETTAGE OF UTERUS  1990   TONSILLECTOMY  1980   Patient Active Problem List   Diagnosis Date Noted   Right hip pain 08/17/2023   Greater trochanteric bursitis, right 05/31/2023   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 02/03/2023   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   Elevated LDL cholesterol level 04/14/2021   Polyarthralgia 03/17/2021   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, right trigger thumb 04/24/2018   Numbness of right third and fourth toes 03/19/2018   Primary osteoarthritis of both hips 12/08/2015   Headache 08/12/2010   Lumbar degenerative disc disease 01/07/2008    PCP: Agapito Games, MD   REFERRING PROVIDER: Monica Becton, MD   REFERRING DIAG: M25.551 (ICD-10-CM) - Right hip pain   THERAPY DIAG:  Pain in right hip  Other abnormalities of gait and mobility  Muscle weakness (generalized)  Rationale for Evaluation and Treatment: Rehabilitation  ONSET DATE: May 2024 with recent worsening January 2025   SUBJECTIVE:   SUBJECTIVE STATEMENT: [facility] and has been [facility] and doing some  exercises in the pool.   EVAL: Patient reports she tore her hamstring in May 2024 and wasn't diagnosed until June. She had PT for this injury. The hamstring pain is still there, but not as bad. She is now having worsening pain along the side/front of hip that started about 2 weeks ago. She reports that it was swollen. The hip was so painful that she couldn't abduct the hip. She increased her walking speed right around the time that this pain began in the front/side of the hip, so is unsure if this was the cause of her new area of pain. She saw Dr. Karie Schwalbe and had an injection, which seems to have helped with her pain. Rotational movement or transitioning from sit to stand still bothers the hip. Prior to this pain she was walking daily about 3 miles.   PERTINENT HISTORY: Rt hip injection 08/17/23 PAIN:  Are you having pain? Yes: NPRS scale: 1/10 Pain location: Rt hip Pain description: stabbing Aggravating factors: rotational movement, transitioning from sit to stand Relieving factors: injection,rest  PRECAUTIONS: None  RED FLAGS: None   WEIGHT BEARING RESTRICTIONS: No  FALLS:  Has patient fallen in last 6 months? No  LIVING ENVIRONMENT: Lives with: lives with their spouse Lives in: House/apartment Stairs: Yes: Internal: 13 steps; on left going up Has following equipment at home: None  OCCUPATION: retired   PLOF: Independent  PATIENT GOALS: "I  would love to have no pain and resume back to normal; make it more manageable."   NEXT MD VISIT: 09/28/23  OBJECTIVE:  Note: Objective measures were completed at Evaluation unless otherwise noted.  DIAGNOSTIC FINDINGS:  Rt Hip MRI: IMPRESSION: 1. Right hamstring tendinosis with a full-thickness minimally retracted tear of the biceps femoris tendon origin with peritendinous edema. 2. Moderate volume fluid within the right ischiogluteal bursa. 3. Mild tendinosis of the contralateral left hamstring origin. 4. Mild tendinosis of the bilateral  gluteus medius and minimus tendons without tear. 5. Mild osteoarthritis of the right hip.  PATIENT SURVEYS:  FOTO 56% function; 64% predicted  09/26/23: 73% function   COGNITION: Overall cognitive status: Within functional limits for tasks assessed     SENSATION: Not tested  EDEMA:  Not inspected   MUSCLE LENGTH: Not tested   POSTURE: No Significant postural limitations  PALPATION: TTP Rt greater trochanter, lateral gluteals  LOWER EXTREMITY ROM:  Active ROM Right eval Left eval 09/06/23 Right  09/26/23 Right   Hip flexion Full   Full  Full dull pain anterolateral hip with lowering   Hip extension      Hip abduction Full pain   Full pain anterior hip/thigh Full   Hip adduction      Hip internal rotation 30 pain   28 pain 30 twinge at greater trochanter  Hip external rotation 28  30 35 sharp pain lateral thigh   Knee flexion      Knee extension      Ankle dorsiflexion      Ankle plantarflexion      Ankle inversion      Ankle eversion       (Blank rows = not tested)  LOWER EXTREMITY MMT:  MMT Right eval Left eval 09/06/23 Right  09/26/23 Right   Hip flexion 4+ 4  4  Hip extension 4 5  4   Hip abduction 3- pain anterior hip 5 3+ pain anterolateral hip 3+ pain greater trochanter  Hip adduction 5     Hip internal rotation 4+ "twinge" 4  4+ twinge greater trochanter  Hip external rotation 5 5  5   Knee flexion 5 5  In prone 4 biased to bicep femoris   Knee extension 5 5    Ankle dorsiflexion      Ankle plantarflexion      Ankle inversion      Ankle eversion       (Blank rows = not tested)  LOWER EXTREMITY SPECIAL TESTS:  Not tested   FUNCTIONAL TESTS:  Not tested   GAIT: Distance walked: 20 ft  Assistive device utilized: None Level of assistance: Complete Independence Comments: Trendelenburg Rt  [facility] Adult PT Treatment:                                                DATE: 10/10/23 Therapeutic Exercise: Electa Sniff d/c due to poor form  Manual Therapy: STM  Rt gluteals, TFL, IT band   Therapeutic Activity: Lateral step ups 2 x 10; 4 inch step   Self Care: Discussed appropriate fitness center classes to trial Discussed increasing distance/time for walking slowly progressing to 30 minutes Discussed recovery days between walking/strengthening  Educated on anatomy of the hip    [facility] Adult PT Treatment:  DATE: 10/06/2023 Therapeutic Exercise: Treadmill: 2.0 mph, 0% incline x 4 min --> 2% incline x Runners step up 6" step x10 + HHAx1 6" lateral step down heel taps x10 5# pulleys --> trunk rotation 1/2 kneeling paloff press --> double blue TB Divers (R) --> small range with pelvic rotation towards R for glute activation Neuromuscular re-ed: Foam beam: Mini squats x10 Alternating step around turns Fwd/bkwd tandem walking Hip hikes standing on R LE --> 4" step, L foot reach on/off block Slow march --> high knee on R LE High knee lateral marching   [facility] Adult PT Treatment:                                                DATE: 10/03/2023 Therapeutic Exercise: Treadmill: 2.0 mph, 0% incline x 5 min Glute med ball press into ball Neuromuscular re-ed: Foam beam: Mini squats (R) step on for single leg balance, step off to solid ground: forward & lateral shifting Tandem walking forward & backward --> side stepping (R) divers --> focus on pelvic alignment & glute activation (R) SLS front/side/back sliders --> progressed to half circles Self Care: Aquatic exercises and how to progress    [facility] Adult PT Treatment:                                                DATE: 09/26/23 Therapeutic Exercise: HEP review  Therapeutic Activity: Re-assessment to determine overall progress, educating patient on progress towards goals.  Self Care: Discussed anatomy of the hip Educated on walking program to begin to implement with handout provided  Discussed potential benefit of compression/hip spica for  upcoming trip    PATIENT EDUCATION:  Education details: see treatment Person educated: Patient Education method: Explanation, Demonstration, and Handouts Education comprehension: verbalized understanding  HOME EXERCISE PROGRAM: Access Code: [email or web address] URL: [email or web address] Date: 10/06/2023 Prepared by: Carlynn Herald  Program Notes Pilates toe corrector: *lengthen through inner leg*1. parallel pull apart2. curling toes --> arch --> ankles; reverse3. fan leg open (ER) --> back to parallel4. 1/2 small circles: fan open, roll over toes/arch/ankle, back to parallel --> revers  Exercises - Standing Quad Stretch in [employer] Position  - 1 x daily - 7 x weekly - 3 sets - 10 reps - Prone Hip Extension  - 1 x daily - 7 x weekly - 2 sets - 10 reps - Quadruped Alternating Leg Extensions  - 1 x daily - 7 x weekly - 2 sets - 10 reps - Primal Push Up  - 1 x daily - 7 x weekly - 3 sets - 10 reps - Single Leg Heel Raise  - 1 x daily - 7 x weekly - 3 sets - 10 reps - Supine Bridge with Resistance Band  - 1 x daily - 7 x weekly - 3 sets - 10 reps - Hooklying Single Leg Bent Knee Fallouts with Resistance  - 1 x daily - 7 x weekly - 3 sets - 10 reps - Modified Single-Leg Deadlift  - 1 x daily - 7 x weekly - 3 sets - 10 reps - The Diver  - 1 x daily - 7 x weekly - 3 sets - 10 reps - Half Kneeling Anti-Rotation Press  With Shoulder Flexion and Anchored Resistance  - 1 x daily - 7 x weekly - 3 sets - 10 reps - Walking March  - 1 x daily - 7 x weekly - 3 sets - 10 reps - Hip Hiking on Step  - 1 x daily - 7 x weekly - 3 sets - 10 reps  ASSESSMENT:  CLINICAL IMPRESSION: Today's session focused on addressing exercise progression as part of home program. Time spent discussing appropriate gym classes to attend at [facility], progression of walking program, and implementing recovery day due to extensive exercise routine patient is currently completing. At next visit will plan to  consolidate HEP's that have been provided during current POC, previous PT POC, and from referring provider. With lateral step ups patient reported mild lateral hip pain towards end of prescribed reps.   EVAL: Patient is a 68 y.o. female who was seen today for physical therapy evaluation and treatment for acute on  chronic Rt hip pain. She reports worsening of anterolateral hip pain that began about 2 weeks ago when she increased her walking speed during her typical daily walking routine. Upon assessment she is noted to have normal ROM about the hip reporting pain with active abduction and IR. She has significant hip abductor weakness with trendelenburg gait noted on the RLE. She will benefit from skilled PT to address the above stated deficits in order to optimize her function and assist with overall pain reduction.   OBJECTIVE IMPAIRMENTS: Abnormal gait, decreased activity tolerance, decreased balance, decreased knowledge of condition, difficulty walking, decreased strength, improper body mechanics, and pain.   ACTIVITY LIMITATIONS: carrying, lifting, bending, sitting, stairs, transfers, and locomotion level  PARTICIPATION LIMITATIONS: meal prep, cleaning, laundry, shopping, community activity, yard work, and recreation   PERSONAL FACTORS: Age, Time since onset of injury/illness/exacerbation, and 1 comorbidity: osteoarthritis  are also affecting patient's functional outcome.   REHAB POTENTIAL: Good  CLINICAL DECISION MAKING: Stable/uncomplicated  EVALUATION COMPLEXITY: Low   GOALS: Goals reviewed with patient? Yes  SHORT TERM GOALS: Target date: 09/13/2023  Patient will be independent and compliant with initial HEP.  Baseline: issued at eval.  Goal status: MET  2.  Patient will demonstrate pain free Rt hip AROM to improve ability to complete reaching and bending activity.  Baseline: pain with IR and abduction AROM Goal status: partially met   3.  Patient will be able to maintain SLS on  the RLE with no pelvic drop to improve gait stability.  Baseline: Trendelenburg  09/06/23: unable to maintain level pelvis without UE support  09/26/23: negative Trendelenburg  Goal status: MET   LONG TERM GOALS: Target date: 11/11/23  Patient will score at least 64% on FOTO to signify clinically meaningful improvement in functional abilities.  Baseline: see above  Goal status: MET  2.  Patient will demonstrate at least 4-/5 Rt hip abductor strength to improve stability with walking.  Baseline: see above Goal status: progressing   3.  Patient will report pain at worst rated as </=2/10 to reduce current functional limitations.   Baseline: 5 Goal status: progressing    PLAN:  PT FREQUENCY: 2x/week  PT DURATION: 6 weeks  PLANNED INTERVENTIONS: 97164- PT Re-evaluation, 97110-Therapeutic exercises, 97530- Therapeutic activity, [id number]- Neuromuscular re-education, 97535- Self Care, [id number]- Manual therapy, [id number]- Gait training, [id number]- Aquatic Therapy, 97014- Electrical stimulation (unattended), [id number]- Electrical stimulation (manual), [id number]- Ionotophoresis 4mg /ml Dexamethasone, Dry Needling, Cryotherapy, and Moist heat  PLAN FOR NEXT SESSION: Review and progress HEP prn; progress glute med strength  as tolerated. Hip strengthening; single leg exercises/activities; consolidate HEP    Letitia Libra, PT, DPT, ATC 10/10/23 12:44 PM

## 2023-10-11 ENCOUNTER — Encounter: Payer: Self-pay | Admitting: Family Medicine

## 2023-10-11 ENCOUNTER — Ambulatory Visit (INDEPENDENT_AMBULATORY_CARE_PROVIDER_SITE_OTHER): Payer: Medicare PPO | Admitting: Family Medicine

## 2023-10-11 ENCOUNTER — Ambulatory Visit: Payer: Medicare PPO

## 2023-10-11 VITALS — BP 117/61 | HR 58 | Ht 64.0 in | Wt 124.0 lb

## 2023-10-11 DIAGNOSIS — M2550 Pain in unspecified joint: Secondary | ICD-10-CM | POA: Diagnosis not present

## 2023-10-11 DIAGNOSIS — M810 Age-related osteoporosis without current pathological fracture: Secondary | ICD-10-CM | POA: Insufficient documentation

## 2023-10-11 DIAGNOSIS — Z1231 Encounter for screening mammogram for malignant neoplasm of breast: Secondary | ICD-10-CM | POA: Diagnosis not present

## 2023-10-11 DIAGNOSIS — E7800 Pure hypercholesterolemia, unspecified: Secondary | ICD-10-CM | POA: Diagnosis not present

## 2023-10-11 MED ORDER — MUPIROCIN 2 % EX OINT: TOPICAL_OINTMENT | Freq: Two times a day (BID) | CUTANEOUS | 0 refills | Status: DC

## 2023-10-11 NOTE — Addendum Note (Signed)
 Addended by: Nani Gasser D on: 10/11/2023 11:17 AM   Modules accepted: Orders

## 2023-10-11 NOTE — Progress Notes (Addendum)
 Complete physical exam  Patient: Becky Bowers   DOB: 1955/11/10   68 y.o. Female  MRN: [id number]  Subjective:    Chief Complaint  Patient presents with   Annual Exam    Becky Bowers is a 68 y.o. female who presents today for a complete physical exam. She reports consuming a general diet. Exercise is limited by orthopedic condition(s): hip problems, doing PT. She generally feels well.  She does not have additional problems to discuss today.   She saw her OB/GYN recently in February and had a pelvic exam.  She says that they her referring her to pelvic PT this spring.   Most recent fall risk assessment:    05/23/2023    2:38 PM  Fall Risk   Falls in the past year? 0  Number falls in past yr: 0  Injury with Fall? 0  Risk for fall due to : No Fall Risks     Most recent depression screenings:    05/23/2023    2:40 PM 05/23/2023    2:36 PM  PHQ 2/9 Scores  PHQ - 2 Score 0 0         Patient Care Team: Agapito Games, MD as PCP - General   Outpatient Medications Prior to Visit  Medication Sig   Calcium Carbonate-Vitamin D 600-200 MG-UNIT TABS Take 1 tablet by mouth 2 (two) times daily.   celecoxib (CELEBREX) 200 MG capsule One to 2 tablets by mouth daily as needed for pain.   Estradiol 10 MCG TABS vaginal tablet 1 tablet per vagina daily   HYDROcodone-acetaminophen (NORCO/VICODIN) 5-325 MG tablet Take 1 tablet by mouth every 8 (eight) hours as needed for moderate pain (pain score 4-6).   Multiple Vitamins-Minerals (ONE-A-DAY WOMENS 50+ ADVANTAGE PO) Take 1 capsule by mouth daily.   triamcinolone ointment (KENALOG) 0.1 % Apply 1 Application topically 2 (two) times daily.   valACYclovir (VALTREX) 1000 MG tablet Take 1 tablet (1,000 mg total) by mouth as needed.   [DISCONTINUED] clobetasol ointment (TEMOVATE) 0.05 % Apply 1 Application topically 2 (two) times daily. (Patient not taking: Reported on 10/09/2023)   No facility-administered medications prior to  visit.    ROS        Objective:     BP 117/61   Pulse (!) 58   Ht 5\' 4"  (1.626 m)   Wt 124 lb (56.2 kg)   SpO2 100%   BMI 21.28 kg/m     Physical Exam   No results found for any visits on 10/11/23.      Assessment & Plan:    Routine Health Maintenance and Physical Exam  Immunization History  Administered Date(s) Administered   Influenza Split 05/08/2012   Influenza, High Dose Seasonal PF 05/09/2022   Influenza,inj,Quad PF,6+ Mos 05/09/2014   Influenza-Unspecified 05/15/2015, 05/24/2016, 05/12/2017, 05/11/2018, 05/15/2019, 05/04/2020, 05/21/2021   Janssen (J&J) SARS-COV-2 Vaccination 10/11/2019   Moderna SARS-COV2 Booster Vaccination 07/13/2021, 05/09/2022   PFIZER Comirnaty(Gray Top)Covid-19 Tri-Sucrose Vaccine 01/12/2021   PFIZER(Purple Top)SARS-COV-2 Vaccination 06/04/2020   PNEUMOCOCCAL CONJUGATE-20 02/03/2022   Pfizer(Comirnaty)Fall Seasonal Vaccine 12 years and older 10/10/2023   Rsv, Bivalent, Protein Subunit Rsvpref,pf Verdis Frederickson) 04/19/2022   Td 02/18/2009   Tdap 02/06/2011, 09/03/2020   Unspecified SARS-COV-2 Vaccination 04/05/2023   Zoster Recombinant(Shingrix) 07/13/2017, 10/23/2017    Health Maintenance  Topic Date Due   INFLUENZA VACCINE  11/06/2023 (Originally 03/09/2023)   Medicare Annual Wellness (AWV)  05/22/2024   MAMMOGRAM  10/05/2024   Colonoscopy  08/15/2029  DTaP/Tdap/Td (4 - Td or Tdap) 09/03/2030   Pneumonia Vaccine 37+ Years old  Completed   DEXA SCAN  Completed   COVID-19 Vaccine  Completed   Hepatitis C Screening  Completed   Zoster Vaccines- Shingrix  Completed   HPV VACCINES  Aged Out    Discussed health benefits of physical activity, and encouraged her to engage in regular exercise appropriate for her age and condition.  Problem List Items Addressed This Visit       Musculoskeletal and Integument   Osteoporosis   Relevant Orders   CBC   Lipid Panel With LDL/HDL Ratio   CMP14+EGFR   VITAMIN D 25 Hydroxy (Vit-D  Deficiency, Fractures)     Other   Polyarthralgia   Relevant Orders   CBC   Lipid Panel With LDL/HDL Ratio   CMP14+EGFR   VITAMIN D 25 Hydroxy (Vit-D Deficiency, Fractures)   Elevated LDL cholesterol level - Primary   Relevant Orders   CBC   Lipid Panel With LDL/HDL Ratio   CMP14+EGFR   VITAMIN D 25 Hydroxy (Vit-D Deficiency, Fractures)   Return in about 1 year (around 10/10/2024) for Wellness Exam.   Keep up a regular exercise program and make sure you are eating a healthy diet Try to eat 4 servings of dairy a day, or if you are lactose intolerant take a calcium with vitamin D daily.  Your vaccines are up to date.  She had her mammo Cheree Ditto done today.  She had her COVID-vaccine done yesterday on October 10, 2023.  She Also had her flu vaccine in October.  She also had her right upper earlobe pierced a little over a year ago.  But it always stays chronically crusty and sore so she is a most of the point of thinking maybe she should take it out it is never actually been infected.  Going to send over some E. Pearson ointment for her to try to see if we reduce the scale also want her to try wearing a different metal stud and see if that makes a difference before she decides to permanently remove it.   Nani Gasser, MD

## 2023-10-12 ENCOUNTER — Ambulatory Visit: Payer: Medicare PPO

## 2023-10-12 ENCOUNTER — Encounter: Payer: Self-pay | Admitting: Family Medicine

## 2023-10-12 ENCOUNTER — Other Ambulatory Visit: Payer: Self-pay | Admitting: Obstetrics & Gynecology

## 2023-10-12 DIAGNOSIS — M25551 Pain in right hip: Secondary | ICD-10-CM

## 2023-10-12 DIAGNOSIS — M5459 Other low back pain: Secondary | ICD-10-CM | POA: Diagnosis not present

## 2023-10-12 DIAGNOSIS — R2689 Other abnormalities of gait and mobility: Secondary | ICD-10-CM

## 2023-10-12 DIAGNOSIS — M25651 Stiffness of right hip, not elsewhere classified: Secondary | ICD-10-CM | POA: Diagnosis not present

## 2023-10-12 DIAGNOSIS — M6281 Muscle weakness (generalized): Secondary | ICD-10-CM | POA: Diagnosis not present

## 2023-10-12 LAB — CMP14+EGFR
ALT: 9 IU/L (ref 0–32)
AST: 25 IU/L (ref 0–40)
Albumin: 4.7 g/dL (ref 3.9–4.9)
Alkaline Phosphatase: 66 IU/L (ref 44–121)
BUN/Creatinine Ratio: 27 (ref 12–28)
BUN: 21 mg/dL (ref 8–27)
Bilirubin Total: 0.3 mg/dL (ref 0.0–1.2)
CO2: 27 mmol/L (ref 20–29)
Calcium: 9.4 mg/dL (ref 8.7–10.3)
Chloride: 104 mmol/L (ref 96–106)
Creatinine, Ser: 0.79 mg/dL (ref 0.57–1.00)
Globulin, Total: 1.9 g/dL (ref 1.5–4.5)
Glucose: 102 mg/dL — ABNORMAL HIGH (ref 70–99)
Potassium: 5 mmol/L (ref 3.5–5.2)
Sodium: 143 mmol/L (ref 134–144)
Total Protein: 6.6 g/dL (ref 6.0–8.5)
eGFR: 82 mL/min/{1.73_m2} (ref >59)

## 2023-10-12 LAB — LIPID PANEL WITH LDL/HDL RATIO
Cholesterol, Total: 191 mg/dL (ref 100–199)
HDL: 86 mg/dL (ref >39)
LDL Chol Calc (NIH): 94 mg/dL (ref 0–99)
LDL/HDL Ratio: 1.1 ratio (ref 0.0–3.2)
Triglycerides: 61 mg/dL (ref 0–149)
VLDL Cholesterol Cal: 11 mg/dL (ref 5–40)

## 2023-10-12 LAB — SPECIMEN STATUS REPORT

## 2023-10-12 LAB — CBC
Hematocrit: 40.1 % (ref 34.0–46.6)
Hemoglobin: 13 g/dL (ref 11.1–15.9)
MCH: 30.4 pg (ref 26.6–33.0)
MCHC: 32.4 g/dL (ref 31.5–35.7)
MCV: 94 fL (ref 79–97)
Platelets: 191 10*3/uL (ref 150–450)
RBC: 4.27 x10E6/uL (ref 3.77–5.28)
RDW: 12.1 % (ref 11.7–15.4)
WBC: 6.1 10*3/uL (ref 3.4–10.8)

## 2023-10-12 LAB — VITAMIN D 25 HYDROXY (VIT D DEFICIENCY, FRACTURES): Vit D, 25-Hydroxy: 67.2 ng/mL (ref 30.0–100.0)

## 2023-10-12 MED ORDER — CLINDAMYCIN PHOSPHATE 2 % VA CREA: 1.0000 | TOPICAL_CREAM | Freq: Every day | VAGINAL | 0 refills | Status: DC

## 2023-10-12 NOTE — Progress Notes (Signed)
 Your lab work is within acceptable range and there are no concerning findings.   ?

## 2023-10-12 NOTE — Progress Notes (Signed)
 Flagyl did not work (TOC is + for BV).  Will try cleocin cream. Sent to [facility]

## 2023-10-12 NOTE — Therapy (Signed)
 OUTPATIENT PHYSICAL THERAPY LOWER EXTREMITY TREATMENT    Patient Name: Becky Bowers MRN: [id number] DOB:09-28-1955, 68 y.o., female Today's Date: 10/12/2023  END OF SESSION:  PT End of Session - 10/12/23 0932     Visit Number 13    Number of Visits 21    Date for PT Re-Evaluation 11/11/23    Authorization Type [employer]    Authorization Time Period 3/4-11/11/23    Authorization - Visit Number 2    Authorization - Number of Visits 8    Progress Note Due on Visit 19    PT Start Time 0932    PT Stop Time 1015    PT Time Calculation (min) 43 min    Activity Tolerance Patient tolerated treatment well    Behavior During Therapy [facility] for tasks assessed/performed              Past Medical History:  Diagnosis Date   Arthritis    Concussion 09/12/2019   Past Surgical History:  Procedure Laterality Date   APPENDECTOMY  1968   DILATION AND CURETTAGE OF UTERUS  1990   TONSILLECTOMY  1980   Patient Active Problem List   Diagnosis Date Noted   Osteoporosis 10/11/2023   Right hip pain 08/17/2023   Greater trochanteric bursitis, right 05/31/2023   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 02/03/2023   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   Elevated LDL cholesterol level 04/14/2021   Polyarthralgia 03/17/2021   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, right trigger thumb 04/24/2018   Numbness of right third and fourth toes 03/19/2018   Primary osteoarthritis of both hips 12/08/2015   Headache 08/12/2010   Lumbar degenerative disc disease 01/07/2008    PCP: Agapito Games, MD   REFERRING PROVIDER: Monica Becton, MD   REFERRING DIAG: M25.551 (ICD-10-CM) - Right hip pain   THERAPY DIAG:  Pain in right hip  Other abnormalities of gait and mobility  Muscle weakness (generalized)  Rationale for Evaluation and Treatment: Rehabilitation  ONSET DATE: May 2024 with recent worsening January 2025   SUBJECTIVE:    SUBJECTIVE STATEMENT: Patient reports she is hurting today, but not as bad as yesterday. She had a lot of errands yesterday and rated pain about the posterior hip as 8/10 at the end of the day. Today is a 3-4 along posterior gluteals.   EVAL: Patient reports she tore her hamstring in May 2024 and wasn't diagnosed until June. She had PT for this injury. The hamstring pain is still there, but not as bad. She is now having worsening pain along the side/front of hip that started about 2 weeks ago. She reports that it was swollen. The hip was so painful that she couldn't abduct the hip. She increased her walking speed right around the time that this pain began in the front/side of the hip, so is unsure if this was the cause of her new area of pain. She saw Dr. Karie Schwalbe and had an injection, which seems to have helped with her pain. Rotational movement or transitioning from sit to stand still bothers the hip. Prior to this pain she was walking daily about 3 miles.   PERTINENT HISTORY: Rt hip injection 08/17/23 PAIN:  Are you having pain? Yes: NPRS scale: 4/10 Pain location: Rt buttock Pain description: stabbing Aggravating factors: rotational movement, transitioning from sit to stand Relieving factors: injection,rest  PRECAUTIONS: None  RED FLAGS: None   WEIGHT BEARING RESTRICTIONS: No  FALLS:  Has patient  fallen in last 6 months? No  LIVING ENVIRONMENT: Lives with: lives with their spouse Lives in: House/apartment Stairs: Yes: Internal: 13 steps; on left going up Has following equipment at home: None  OCCUPATION: retired   PLOF: Independent  PATIENT GOALS: "I would love to have no pain and resume back to normal; make it more manageable."   NEXT MD VISIT: 09/28/23  OBJECTIVE:  Note: Objective measures were completed at Evaluation unless otherwise noted.  DIAGNOSTIC FINDINGS:  Rt Hip MRI: IMPRESSION: 1. Right hamstring tendinosis with a full-thickness minimally retracted tear of the  biceps femoris tendon origin with peritendinous edema. 2. Moderate volume fluid within the right ischiogluteal bursa. 3. Mild tendinosis of the contralateral left hamstring origin. 4. Mild tendinosis of the bilateral gluteus medius and minimus tendons without tear. 5. Mild osteoarthritis of the right hip.  PATIENT SURVEYS:  FOTO 56% function; 64% predicted  09/26/23: 73% function   COGNITION: Overall cognitive status: Within functional limits for tasks assessed     SENSATION: Not tested  EDEMA:  Not inspected   MUSCLE LENGTH: Not tested   POSTURE: No Significant postural limitations  PALPATION: TTP Rt greater trochanter, lateral gluteals  LOWER EXTREMITY ROM:  Active ROM Right eval Left eval 09/06/23 Right  09/26/23 Right   Hip flexion Full   Full  Full dull pain anterolateral hip with lowering   Hip extension      Hip abduction Full pain   Full pain anterior hip/thigh Full   Hip adduction      Hip internal rotation 30 pain   28 pain 30 twinge at greater trochanter  Hip external rotation 28  30 35 sharp pain lateral thigh   Knee flexion      Knee extension      Ankle dorsiflexion      Ankle plantarflexion      Ankle inversion      Ankle eversion       (Blank rows = not tested)  LOWER EXTREMITY MMT:  MMT Right eval Left eval 09/06/23 Right  09/26/23 Right   Hip flexion 4+ 4  4  Hip extension 4 5  4   Hip abduction 3- pain anterior hip 5 3+ pain anterolateral hip 3+ pain greater trochanter  Hip adduction 5     Hip internal rotation 4+ "twinge" 4  4+ twinge greater trochanter  Hip external rotation 5 5  5   Knee flexion 5 5  In prone 4 biased to bicep femoris   Knee extension 5 5    Ankle dorsiflexion      Ankle plantarflexion      Ankle inversion      Ankle eversion       (Blank rows = not tested)  LOWER EXTREMITY SPECIAL TESTS:  Not tested   FUNCTIONAL TESTS:  Not tested   GAIT: Distance walked: 20 ft  Assistive device utilized: None Level of  assistance: Complete Independence Comments: Trendelenburg Rt  [facility] Adult PT Treatment:                                                DATE: 10/12/23 Therapeutic Exercise: Reviewed, performed, and updated HEP discussing frequency, sets, and reps Discussed implementing core/hip strengthening in pilates focusing on form/control with pain free activity  Reviewed appropriate classes to trial at [employer]  Manual Therapy: Skilled palpation of trigger points  Trigger Point Dry Needling  Subsequent Treatment: Instructions provided previously at initial dry needling treatment.   Patient Verbal Consent Given: Yes Education Handout Provided: Previously Provided Muscles Treated: Rt gluteals  Electrical Stimulation Performed: No Treatment Response/Outcome: patient reported deep twitch response and pain reduction   Self Care: Discussed importance of rest/recovery days to allow for tissue healing/adaption to loading  Ice for swelling/pain control   [facility] Adult PT Treatment:                                                DATE: 10/10/23 Therapeutic Exercise: Electa Sniff d/c due to poor form  Manual Therapy: STM Rt gluteals, TFL, IT band   Therapeutic Activity: Lateral step ups 2 x 10; 4 inch step   Self Care: Discussed appropriate fitness center classes to trial Discussed increasing distance/time for walking slowly progressing to 30 minutes Discussed recovery days between walking/strengthening  Educated on anatomy of the hip    [facility] Adult PT Treatment:                                                DATE: 10/06/2023 Therapeutic Exercise: Treadmill: 2.0 mph, 0% incline x 4 min --> 2% incline x Runners step up 6" step x10 + HHAx1 6" lateral step down heel taps x10 5# pulleys --> trunk rotation 1/2 kneeling paloff press --> double blue TB Divers (R) --> small range with pelvic rotation towards R for glute activation Neuromuscular re-ed: Foam beam: Mini squats x10 Alternating step around  turns Fwd/bkwd tandem walking Hip hikes standing on R LE --> 4" step, L foot reach on/off block Slow march --> high knee on R LE High knee lateral marching   PATIENT EDUCATION:  Education details: see treatment Person educated: Patient Education method: Explanation, Demonstration, and Handouts, cues  Education comprehension: verbalized understanding, returned demo, cues   HOME EXERCISE PROGRAM: Access Code: [email or web address] URL: [email or web address] Date: 10/12/2023 Prepared by: Letitia Libra  Exercises - Prone Hip Extension  - 1 x daily - 3 x weekly - 2 sets - 10 reps - Quadruped Alternating Leg Extensions  - 1 x daily - 3 x weekly - 2 sets - 10 reps - Figure 4 Bridge  - 1 x daily - 3 x weekly - 2 sets - 10 reps - Sidelying Hip Abduction  - 1 x daily - 3 x weekly - 2 sets - 10 reps - Single Leg Stance  - 1 x daily - 3 x weekly - 3 sets - 30 sec  hold - Modified Single-Leg Deadlift  - 1 x daily - 3 x weekly - 3 sets - 10 reps  ASSESSMENT:  CLINICAL IMPRESSION: TPDN performed to Rt gluteals with patient reporting a deep twitch response and reduction in pain at conclusion of session. We focused on consolidating multiple home exercise programs that she has acquired from PT and MD as well as discussed importance of allowing for rest/recovery day for proper healing and adaption of tissue. Patient performed all exercises as part of updated HEP requiring cues for proper core engagement/maintaining neutral pelvis initially with ability to correct with cues.   EVAL: Patient is a 68 y.o. female who was seen today for physical therapy evaluation and  treatment for acute on  chronic Rt hip pain. She reports worsening of anterolateral hip pain that began about 2 weeks ago when she increased her walking speed during her typical daily walking routine. Upon assessment she is noted to have normal ROM about the hip reporting pain with active abduction and IR. She has significant hip abductor  weakness with trendelenburg gait noted on the RLE. She will benefit from skilled PT to address the above stated deficits in order to optimize her function and assist with overall pain reduction.   OBJECTIVE IMPAIRMENTS: Abnormal gait, decreased activity tolerance, decreased balance, decreased knowledge of condition, difficulty walking, decreased strength, improper body mechanics, and pain.   ACTIVITY LIMITATIONS: carrying, lifting, bending, sitting, stairs, transfers, and locomotion level  PARTICIPATION LIMITATIONS: meal prep, cleaning, laundry, shopping, community activity, yard work, and recreation   PERSONAL FACTORS: Age, Time since onset of injury/illness/exacerbation, and 1 comorbidity: osteoarthritis  are also affecting patient's functional outcome.   REHAB POTENTIAL: Good  CLINICAL DECISION MAKING: Stable/uncomplicated  EVALUATION COMPLEXITY: Low   GOALS: Goals reviewed with patient? Yes  SHORT TERM GOALS: Target date: 09/13/2023  Patient will be independent and compliant with initial HEP.  Baseline: issued at eval.  Goal status: MET  2.  Patient will demonstrate pain free Rt hip AROM to improve ability to complete reaching and bending activity.  Baseline: pain with IR and abduction AROM Goal status: partially met   3.  Patient will be able to maintain SLS on the RLE with no pelvic drop to improve gait stability.  Baseline: Trendelenburg  09/06/23: unable to maintain level pelvis without UE support  09/26/23: negative Trendelenburg  Goal status: MET   LONG TERM GOALS: Target date: 11/11/23  Patient will score at least 64% on FOTO to signify clinically meaningful improvement in functional abilities.  Baseline: see above  Goal status: MET  2.  Patient will demonstrate at least 4-/5 Rt hip abductor strength to improve stability with walking.  Baseline: see above Goal status: progressing   3.  Patient will report pain at worst rated as </=2/10 to reduce current functional  limitations.   Baseline: 5 Goal status: progressing    PLAN:  PT FREQUENCY: 2x/week  PT DURATION: 6 weeks  PLANNED INTERVENTIONS: 97164- PT Re-evaluation, 97110-Therapeutic exercises, 97530- Therapeutic activity, [id number]- Neuromuscular re-education, 97535- Self Care, [id number]- Manual therapy, [id number]- Gait training, [id number]- Aquatic Therapy, 97014- Electrical stimulation (unattended), [id number]- Electrical stimulation (manual), [id number]- Ionotophoresis 4mg /ml Dexamethasone, Dry Needling, Cryotherapy, and Moist heat  PLAN FOR NEXT SESSION: Review and progress HEP prn; progress glute med strength as tolerated. Hip strengthening; single leg exercises/activities; TPDN response?   Letitia Libra, PT, DPT, ATC 10/12/23 12:19 PM

## 2023-10-14 ENCOUNTER — Encounter: Payer: Self-pay | Admitting: Obstetrics & Gynecology

## 2023-10-17 ENCOUNTER — Encounter: Payer: Self-pay | Admitting: Physician Assistant

## 2023-10-17 NOTE — Progress Notes (Signed)
Normal mammogram. Follow up in one year.

## 2023-10-18 ENCOUNTER — Ambulatory Visit: Payer: Medicare PPO

## 2023-10-18 DIAGNOSIS — M25551 Pain in right hip: Secondary | ICD-10-CM | POA: Diagnosis not present

## 2023-10-18 DIAGNOSIS — M6281 Muscle weakness (generalized): Secondary | ICD-10-CM | POA: Diagnosis not present

## 2023-10-18 DIAGNOSIS — M25651 Stiffness of right hip, not elsewhere classified: Secondary | ICD-10-CM | POA: Diagnosis not present

## 2023-10-18 DIAGNOSIS — R2689 Other abnormalities of gait and mobility: Secondary | ICD-10-CM

## 2023-10-18 DIAGNOSIS — M5459 Other low back pain: Secondary | ICD-10-CM | POA: Diagnosis not present

## 2023-10-18 NOTE — Therapy (Signed)
 OUTPATIENT PHYSICAL THERAPY LOWER EXTREMITY TREATMENT    Patient Name: ANALISIA KINGSFORD MRN: [id number] DOB:[birth year], 68 y.o., female Today's Date: 10/18/2023  END OF SESSION:  PT End of Session - 10/18/23 0929     Visit Number 14    Number of Visits 21    Date for PT Re-Evaluation 11/11/23    Authorization Type [employer]    Authorization Time Period 3/4-11/11/23    Authorization - Visit Number 3    Authorization - Number of Visits 8    Progress Note Due on Visit 19    PT Start Time 0932    PT Stop Time 1015    PT Time Calculation (min) 43 min    Activity Tolerance Patient tolerated treatment well    Behavior During Therapy [facility] for tasks assessed/performed            Past Medical History:  Diagnosis Date   Arthritis    Concussion 09/12/2019   Past Surgical History:  Procedure Laterality Date   APPENDECTOMY  1968   DILATION AND CURETTAGE OF UTERUS  1990   TONSILLECTOMY  1980   Patient Active Problem List   Diagnosis Date Noted   Osteoporosis 10/11/2023   Right hip pain 08/17/2023   Greater trochanteric bursitis, right 05/31/2023   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 02/03/2023   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   Elevated LDL cholesterol level 04/14/2021   Polyarthralgia 03/17/2021   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, right trigger thumb 04/24/2018   Numbness of right third and fourth toes 03/19/2018   Primary osteoarthritis of both hips 12/08/2015   Headache 08/12/2010   Lumbar degenerative disc disease 01/07/2008    PCP: Agapito Games, MD   REFERRING PROVIDER: Monica Becton, MD   REFERRING DIAG: M25.551 (ICD-10-CM) - Right hip pain   THERAPY DIAG:  Pain in right hip  Other abnormalities of gait and mobility  Muscle weakness (generalized)  Stiffness of right hip, not elsewhere classified  Rationale for Evaluation and Treatment: Rehabilitation  ONSET DATE: May 2024 with  recent worsening January 2025   SUBJECTIVE:   SUBJECTIVE STATEMENT: Patient reports she continues to have pain primarily along lateral R thigh; also has pain around "sits bones and tail bone" when sitting. Patient states she has been following recommended protocol with having rest days between exercise days. Patient states she was sore after dry needling at last visit and felt better that day, however uncertain if it made a difference long-term.   EVAL: Patient reports she tore her hamstring in May 2024 and wasn't diagnosed until June. She had PT for this injury. The hamstring pain is still there, but not as bad. She is now having worsening pain along the side/front of hip that started about 2 weeks ago. She reports that it was swollen. The hip was so painful that she couldn't abduct the hip. She increased her walking speed right around the time that this pain began in the front/side of the hip, so is unsure if this was the cause of her new area of pain. She saw Dr. Karie Schwalbe and had an injection, which seems to have helped with her pain. Rotational movement or transitioning from sit to stand still bothers the hip. Prior to this pain she was walking daily about 3 miles.   PERTINENT HISTORY: Rt hip injection 08/17/23 PAIN:  Are you having pain? Yes: NPRS scale: 4/10 Pain location: Rt buttock Pain description: stabbing Aggravating factors: rotational  movement, transitioning from sit to stand Relieving factors: injection,rest  PRECAUTIONS: None  RED FLAGS: None   WEIGHT BEARING RESTRICTIONS: No  FALLS:  Has patient fallen in last 6 months? No  LIVING ENVIRONMENT: Lives with: lives with their spouse Lives in: House/apartment Stairs: Yes: Internal: 13 steps; on left going up Has following equipment at home: None  OCCUPATION: retired   PLOF: Independent  PATIENT GOALS: "I would love to have no pain and resume back to normal; make it more manageable."   NEXT MD VISIT: 10/19/2023  OBJECTIVE:   Note: Objective measures were completed at Evaluation unless otherwise noted.  DIAGNOSTIC FINDINGS:  Rt Hip MRI: IMPRESSION: 1. Right hamstring tendinosis with a full-thickness minimally retracted tear of the biceps femoris tendon origin with peritendinous edema. 2. Moderate volume fluid within the right ischiogluteal bursa. 3. Mild tendinosis of the contralateral left hamstring origin. 4. Mild tendinosis of the bilateral gluteus medius and minimus tendons without tear. 5. Mild osteoarthritis of the right hip.  PATIENT SURVEYS:  FOTO 56% function; 64% predicted  09/26/23: 73% function   COGNITION: Overall cognitive status: Within functional limits for tasks assessed     SENSATION: Not tested  EDEMA:  Not inspected   MUSCLE LENGTH: Not tested   POSTURE: No Significant postural limitations  PALPATION: TTP Rt greater trochanter, lateral gluteals  LOWER EXTREMITY ROM:  Active ROM Right eval Left eval 09/06/23 Right  09/26/23 Right   Hip flexion Full   Full  Full dull pain anterolateral hip with lowering   Hip extension      Hip abduction Full pain   Full pain anterior hip/thigh Full   Hip adduction      Hip internal rotation 30 pain   28 pain 30 twinge at greater trochanter  Hip external rotation 28  30 35 sharp pain lateral thigh   Knee flexion      Knee extension      Ankle dorsiflexion      Ankle plantarflexion      Ankle inversion      Ankle eversion       (Blank rows = not tested)  LOWER EXTREMITY MMT:  MMT Right eval Left eval 09/06/23 Right  09/26/23 Right   Hip flexion 4+ 4  4  Hip extension 4 5  4   Hip abduction 3- pain anterior hip 5 3+ pain anterolateral hip 3+ pain greater trochanter  Hip adduction 5     Hip internal rotation 4+ "twinge" 4  4+ twinge greater trochanter  Hip external rotation 5 5  5   Knee flexion 5 5  In prone 4 biased to bicep femoris   Knee extension 5 5    Ankle dorsiflexion      Ankle plantarflexion      Ankle inversion       Ankle eversion       (Blank rows = not tested)  LOWER EXTREMITY SPECIAL TESTS:  Not tested   FUNCTIONAL TESTS:  Not tested   GAIT: Distance walked: 20 ft  Assistive device utilized: None Level of assistance: Complete Independence Comments: Trendelenburg Rt   [facility] Adult PT Treatment:                                                DATE: 10/18/2023 Therapeutic Exercise: Reviewed HEP for alignment/body mechanics cues S/L hip abd --> leg raises,  small circles CW/CCCW Neuromuscular re-ed: Prone hip extension + 2#AW on posterior thigh for tactile feedback Quadruped hip extension + head against wall for tactile feedback/postural stabilization Hip abd leg raises Hip extension in abd S/L hip abd with back against wall     [facility] Adult PT Treatment:                                                DATE: 10/12/23 Therapeutic Exercise: Reviewed, performed, and updated HEP discussing frequency, sets, and reps Discussed implementing core/hip strengthening in pilates focusing on form/control with pain free activity  Reviewed appropriate classes to trial at [facility]  Manual Therapy: Skilled palpation of trigger points Trigger Point Dry Needling  Subsequent Treatment: Instructions provided previously at initial dry needling treatment.   Patient Verbal Consent Given: Yes Education Handout Provided: Previously Provided Muscles Treated: Rt gluteals  Electrical Stimulation Performed: No Treatment Response/Outcome: patient reported deep twitch response and pain reduction   Self Care: Discussed importance of rest/recovery days to allow for tissue healing/adaption to loading  Ice for swelling/pain control     [facility] Adult PT Treatment:                                                DATE: 10/10/23 Therapeutic Exercise: Electa Sniff d/c due to poor form  Manual Therapy: STM Rt gluteals, TFL, IT band   Therapeutic Activity: Lateral step ups 2 x 10; 4 inch step   Self Care: Discussed  appropriate fitness center classes to trial Discussed increasing distance/time for walking slowly progressing to 30 minutes Discussed recovery days between walking/strengthening  Educated on anatomy of the hip    PATIENT EDUCATION:  Education details: see treatment Person educated: Patient Education method: Explanation, Demonstration, and Handouts, cues  Education comprehension: verbalized understanding, returned demo, cues   HOME EXERCISE PROGRAM: Access Code: [email or web address] URL: [email or web address] Date: 10/12/2023 Prepared by: Letitia Libra  Exercises - Prone Hip Extension  - 1 x daily - 3 x weekly - 2 sets - 10 reps - Quadruped Alternating Leg Extensions  - 1 x daily - 3 x weekly - 2 sets - 10 reps - Figure 4 Bridge  - 1 x daily - 3 x weekly - 2 sets - 10 reps - Sidelying Hip Abduction  - 1 x daily - 3 x weekly - 2 sets - 10 reps - Single Leg Stance  - 1 x daily - 3 x weekly - 3 sets - 30 sec  hold - Modified Single-Leg Deadlift  - 1 x daily - 3 x weekly - 3 sets - 10 reps  ASSESSMENT:  CLINICAL IMPRESSION: HEP reviewed for proper form and alignment. Utilizing wall provided tactile feedback for pelvic and postural stability during side lying hip and variations. Glute fatigue noted during figure 4 bridges and hip extension in side lying with cues. Patient able to complete all exercises with minimal discomfort.   EVAL: Patient is a 68 y.o. female who was seen today for physical therapy evaluation and treatment for acute on  chronic Rt hip pain. She reports worsening of anterolateral hip pain that began about 2 weeks ago when she increased her walking speed during her typical daily walking routine. Upon assessment she is  noted to have normal ROM about the hip reporting pain with active abduction and IR. She has significant hip abductor weakness with trendelenburg gait noted on the RLE. She will benefit from skilled PT to address the above stated deficits in order to optimize  her function and assist with overall pain reduction.   OBJECTIVE IMPAIRMENTS: Abnormal gait, decreased activity tolerance, decreased balance, decreased knowledge of condition, difficulty walking, decreased strength, improper body mechanics, and pain.   ACTIVITY LIMITATIONS: carrying, lifting, bending, sitting, stairs, transfers, and locomotion level  PARTICIPATION LIMITATIONS: meal prep, cleaning, laundry, shopping, community activity, yard work, and recreation   PERSONAL FACTORS: Age, Time since onset of injury/illness/exacerbation, and 1 comorbidity: osteoarthritis  are also affecting patient's functional outcome.   REHAB POTENTIAL: Good  CLINICAL DECISION MAKING: Stable/uncomplicated  EVALUATION COMPLEXITY: Low   GOALS: Goals reviewed with patient? Yes  SHORT TERM GOALS: Target date: 09/13/2023  Patient will be independent and compliant with initial HEP.  Baseline: issued at eval.  Goal status: MET  2.  Patient will demonstrate pain free Rt hip AROM to improve ability to complete reaching and bending activity.  Baseline: pain with IR and abduction AROM Goal status: partially met   3.  Patient will be able to maintain SLS on the RLE with no pelvic drop to improve gait stability.  Baseline: Trendelenburg  09/06/23: unable to maintain level pelvis without UE support  09/26/23: negative Trendelenburg  Goal status: MET   LONG TERM GOALS: Target date: 11/11/23  Patient will score at least 64% on FOTO to signify clinically meaningful improvement in functional abilities.  Baseline: see above  Goal status: MET  2.  Patient will demonstrate at least 4-/5 Rt hip abductor strength to improve stability with walking.  Baseline: see above Goal status: progressing   3.  Patient will report pain at worst rated as </=2/10 to reduce current functional limitations.   Baseline: 5 Goal status: progressing    PLAN:  PT FREQUENCY: 2x/week  PT DURATION: 6 weeks  PLANNED INTERVENTIONS:  97164- PT Re-evaluation, 97110-Therapeutic exercises, 97530- Therapeutic activity, [id number]- Neuromuscular re-education, 97535- Self Care, 96045- Manual therapy, [id number]- Gait training, [id number]- Aquatic Therapy, 97014- Electrical stimulation (unattended), [id number]- Electrical stimulation (manual), [id number]- Ionotophoresis 4mg /ml Dexamethasone, Dry Needling, Cryotherapy, and Moist heat  PLAN FOR NEXT SESSION: Review HEP as needed; progress glute med/HS strength as tolerated. Hip strengthening; single leg exercises/activities   Carlynn Herald, PTA 10/18/23 10:15 AM

## 2023-10-19 ENCOUNTER — Ambulatory Visit (INDEPENDENT_AMBULATORY_CARE_PROVIDER_SITE_OTHER): Payer: Medicare PPO | Admitting: Sports Medicine

## 2023-10-19 ENCOUNTER — Encounter: Payer: Self-pay | Admitting: Sports Medicine

## 2023-10-19 DIAGNOSIS — S76311A Strain of muscle, fascia and tendon of the posterior muscle group at thigh level, right thigh, initial encounter: Secondary | ICD-10-CM

## 2023-10-19 DIAGNOSIS — M51362 Other intervertebral disc degeneration, lumbar region with discogenic back pain and lower extremity pain: Secondary | ICD-10-CM | POA: Diagnosis not present

## 2023-10-19 MED ORDER — GABAPENTIN 300 MG PO CAPS: ORAL_CAPSULE | ORAL | 3 refills | Status: DC

## 2023-10-19 NOTE — Assessment & Plan Note (Signed)
 Becky Bowers returns, she is a pleasant 68 year old female, she has lateral thigh pain, as well as bilateral lower back and buttock pain. Worse with twisting, she does have known multilevel disc disease and facet arthropathy. At this point she has failed greater than 6 weeks of physical therapy, we will proceed with MRI for injection planning. Adding some home conditioning as well.

## 2023-10-19 NOTE — Assessment & Plan Note (Signed)
 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 She has a trip coming up at the end of April, she will return 2 weeks before for an ischiogluteal bursa injection with ultrasound guidance. I have suggested adding gabapentin as she does have widespread migratory pain that we have not gotten a great grasp on overall yet.

## 2023-10-19 NOTE — Progress Notes (Signed)
    Procedures performed today:    None.  Independent interpretation of notes and tests performed by another provider:   None.  Brief History, Exam, Impression, and Recommendations:    Lumbar degenerative disc disease Becky Bowers returns, she is a pleasant 68 year old female, she has lateral thigh pain, as well as bilateral lower back and buttock pain. Worse with twisting, she does have known multilevel disc disease and facet arthropathy. At this point she has failed greater than 6 weeks of physical therapy, we will proceed with MRI for injection planning. Adding some home conditioning as well.  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 She has a trip coming up at the end of April, she will return 2 weeks before for an ischiogluteal bursa injection with ultrasound guidance. I have suggested adding gabapentin as she does have widespread migratory pain that we have not gotten a great grasp on overall yet.    ____________________________________________ Becky Bowers. Becky Bowers, M.D., ABFM., CAQSM., AME. Primary Care and Sports Medicine [facility]  Adjunct Professor of Family Medicine  [employer]  Restaurant manager, fast food

## 2023-10-20 ENCOUNTER — Other Ambulatory Visit: Payer: Self-pay | Admitting: Sports Medicine

## 2023-10-20 ENCOUNTER — Ambulatory Visit

## 2023-10-20 ENCOUNTER — Ambulatory Visit: Payer: Medicare PPO

## 2023-10-20 ENCOUNTER — Other Ambulatory Visit (HOSPITAL_COMMUNITY)
Admission: RE | Admit: 2023-10-20 | Discharge: 2023-10-20 | Disposition: A | Discharge status: Home / Self Care | Source: Ambulatory Visit | Attending: Obstetrics & Gynecology | Admitting: Obstetrics & Gynecology

## 2023-10-20 DIAGNOSIS — B9689 Other specified bacterial agents as the cause of diseases classified elsewhere: Secondary | ICD-10-CM | POA: Diagnosis not present

## 2023-10-20 DIAGNOSIS — N760 Acute vaginitis: Secondary | ICD-10-CM | POA: Insufficient documentation

## 2023-10-20 DIAGNOSIS — M25551 Pain in right hip: Secondary | ICD-10-CM | POA: Diagnosis not present

## 2023-10-20 DIAGNOSIS — M51362 Other intervertebral disc degeneration, lumbar region with discogenic back pain and lower extremity pain: Secondary | ICD-10-CM

## 2023-10-20 DIAGNOSIS — M6281 Muscle weakness (generalized): Secondary | ICD-10-CM | POA: Diagnosis not present

## 2023-10-20 DIAGNOSIS — M5459 Other low back pain: Secondary | ICD-10-CM | POA: Diagnosis not present

## 2023-10-20 DIAGNOSIS — R2689 Other abnormalities of gait and mobility: Secondary | ICD-10-CM

## 2023-10-20 DIAGNOSIS — M25651 Stiffness of right hip, not elsewhere classified: Secondary | ICD-10-CM | POA: Diagnosis not present

## 2023-10-20 NOTE — Therapy (Signed)
 OUTPATIENT PHYSICAL THERAPY LOWER EXTREMITY TREATMENT    Patient Name: Becky Bowers MRN: [id number] DOB:1956-05-17, 67 y.o., female Today's Date: 10/20/2023  END OF SESSION:  PT End of Session - 10/20/23 0932     Visit Number 15    Number of Visits 21    Date for PT Re-Evaluation 11/11/23    Authorization Type [employer]    Authorization Time Period 3/4-11/11/23    Authorization - Visit Number 4    Authorization - Number of Visits 8    Progress Note Due on Visit 19    PT Start Time 0932    PT Stop Time 1014    PT Time Calculation (min) 42 min    Activity Tolerance Patient tolerated treatment well    Behavior During Therapy [facility] for tasks assessed/performed             Past Medical History:  Diagnosis Date   Arthritis    Concussion 09/12/2019   Past Surgical History:  Procedure Laterality Date   APPENDECTOMY  1968   DILATION AND CURETTAGE OF UTERUS  1990   TONSILLECTOMY  1980   Patient Active Problem List   Diagnosis Date Noted   Osteoporosis 10/11/2023   Right hip pain 08/17/2023   Greater trochanteric bursitis, right 05/31/2023   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 02/03/2023   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   Elevated LDL cholesterol level 04/14/2021   Polyarthralgia 03/17/2021   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, right trigger thumb 04/24/2018   Numbness of right third and fourth toes 03/19/2018   Primary osteoarthritis of both hips 12/08/2015   Headache 08/12/2010   Lumbar degenerative disc disease 01/07/2008    PCP: Agapito Games, MD   REFERRING PROVIDER: Monica Becton, MD   REFERRING DIAG: M25.551 (ICD-10-CM) - Right hip pain   THERAPY DIAG:  Pain in right hip  Other abnormalities of gait and mobility  Muscle weakness (generalized)  Rationale for Evaluation and Treatment: Rehabilitation  ONSET DATE: May 2024 with recent worsening January 2025   SUBJECTIVE:    SUBJECTIVE STATEMENT: Patient reports mostly the Rt hip is hurting with sitting right now. She saw Dr. Karie Schwalbe yesterday and is getting a back MRI scheduled.   EVAL: Patient reports she tore her hamstring in May 2024 and wasn't diagnosed until June. She had PT for this injury. The hamstring pain is still there, but not as bad. She is now having worsening pain along the side/front of hip that started about 2 weeks ago. She reports that it was swollen. The hip was so painful that she couldn't abduct the hip. She increased her walking speed right around the time that this pain began in the front/side of the hip, so is unsure if this was the cause of her new area of pain. She saw Dr. Karie Schwalbe and had an injection, which seems to have helped with her pain. Rotational movement or transitioning from sit to stand still bothers the hip. Prior to this pain she was walking daily about 3 miles.   PERTINENT HISTORY: Rt hip injection 08/17/23 PAIN:  Are you having pain? Yes: NPRS scale: 2/10 Pain location: Rt buttock and lateral thigh Pain description: stabbing, sitting on rocks Aggravating factors: rotational movement, sitting, transitioning from sitting  Relieving factors: injection,rest  PRECAUTIONS: None  RED FLAGS: None   WEIGHT BEARING RESTRICTIONS: No  FALLS:  Has patient fallen in last 6 months? No  LIVING ENVIRONMENT: Lives with: lives  with their spouse Lives in: House/apartment Stairs: Yes: Internal: 13 steps; on left going up Has following equipment at home: None  OCCUPATION: retired   PLOF: Independent  PATIENT GOALS: "I would love to have no pain and resume back to normal; make it more manageable."   NEXT MD VISIT: 10/19/2023  OBJECTIVE:  Note: Objective measures were completed at Evaluation unless otherwise noted.  DIAGNOSTIC FINDINGS:  Rt Hip MRI: IMPRESSION: 1. Right hamstring tendinosis with a full-thickness minimally retracted tear of the biceps femoris tendon origin  with peritendinous edema. 2. Moderate volume fluid within the right ischiogluteal bursa. 3. Mild tendinosis of the contralateral left hamstring origin. 4. Mild tendinosis of the bilateral gluteus medius and minimus tendons without tear. 5. Mild osteoarthritis of the right hip.  PATIENT SURVEYS:  FOTO 56% function; 64% predicted  09/26/23: 73% function   COGNITION: Overall cognitive status: Within functional limits for tasks assessed     SENSATION: Not tested  EDEMA:  Not inspected   MUSCLE LENGTH: Not tested   POSTURE: No Significant postural limitations  PALPATION: TTP Rt greater trochanter, lateral gluteals  LOWER EXTREMITY ROM:  Active ROM Right eval Left eval 09/06/23 Right  09/26/23 Right   Hip flexion Full   Full  Full dull pain anterolateral hip with lowering   Hip extension      Hip abduction Full pain   Full pain anterior hip/thigh Full   Hip adduction      Hip internal rotation 30 pain   28 pain 30 twinge at greater trochanter  Hip external rotation 28  30 35 sharp pain lateral thigh   Knee flexion      Knee extension      Ankle dorsiflexion      Ankle plantarflexion      Ankle inversion      Ankle eversion       (Blank rows = not tested)  LOWER EXTREMITY MMT:  MMT Right eval Left eval 09/06/23 Right  09/26/23 Right  10/20/23 Right   Hip flexion 4+ 4  4   Hip extension 4 5  4    Hip abduction 3- pain anterior hip 5 3+ pain anterolateral hip 3+ pain greater trochanter 4-   Hip adduction 5      Hip internal rotation 4+ "twinge" 4  4+ twinge greater trochanter   Hip external rotation 5 5  5    Knee flexion 5 5  In prone 4 biased to bicep femoris    Knee extension 5 5     Ankle dorsiflexion       Ankle plantarflexion       Ankle inversion       Ankle eversion        (Blank rows = not tested)  LOWER EXTREMITY SPECIAL TESTS:  Not tested   FUNCTIONAL TESTS:  Not tested   GAIT: Distance walked: 20 ft  Assistive device utilized: None Level of  assistance: Complete Independence Comments: Trendelenburg Rt  [facility] Adult PT Treatment:                                                DATE: 10/20/23  Neuromuscular re-ed: Standing hip abduction 2 x 10  Standing hip extension 2 x 10  Hip bridge march 2 x 10  Fire hydrant 2 x 10    [facility] Adult PT Treatment:  DATE: 10/18/2023 Therapeutic Exercise: Reviewed HEP for alignment/body mechanics cues S/L hip abd --> leg raises, small circles CW/CCCW Neuromuscular re-ed: Prone hip extension + 2#AW on posterior thigh for tactile feedback Quadruped hip extension + head against wall for tactile feedback/postural stabilization Hip abd leg raises Hip extension in abd S/L hip abd with back against wall     [facility] Adult PT Treatment:                                                DATE: 10/12/23 Therapeutic Exercise: Reviewed, performed, and updated HEP discussing frequency, sets, and reps Discussed implementing core/hip strengthening in pilates focusing on form/control with pain free activity  Reviewed appropriate classes to trial at [facility]  Manual Therapy: Skilled palpation of trigger points Trigger Point Dry Needling  Subsequent Treatment: Instructions provided previously at initial dry needling treatment.   Patient Verbal Consent Given: Yes Education Handout Provided: Previously Provided Muscles Treated: Rt gluteals  Electrical Stimulation Performed: No Treatment Response/Outcome: patient reported deep twitch response and pain reduction   Self Care: Discussed importance of rest/recovery days to allow for tissue healing/adaption to loading  Ice for swelling/pain control     PATIENT EDUCATION:  Education details: HEP update  Person educated: Patient Education method: Explanation, Demonstration, and Handouts, cues  Education comprehension: verbalized understanding, returned demo, cues   HOME EXERCISE PROGRAM: Access Code:  [email or web address] URL: [email or web address] Date: 10/20/2023 Prepared by: Letitia Libra  Exercises - Quadruped Alternating Leg Extensions  - 1 x daily - 3 x weekly - 2 sets - 10 reps - Sidelying Hip Abduction  - 1 x daily - 3 x weekly - 2 sets - 10 reps - Modified Single-Leg Deadlift  - 1 x daily - 3 x weekly - 3 sets - 10 reps - Standing Hip Extension with Counter Support  - 1 x daily - 3 x weekly - 2 sets - 10 reps - Standing Hip Abduction with Counter Support  - 1 x daily - 3 x weekly - 2 sets - 10 reps - Marching Bridge  - 1 x daily - 3 x weekly - 2 sets - 10 reps  ASSESSMENT:  CLINICAL IMPRESSION: Progressed into standing hip strengthening today with good tolerance. She is able to maintain lumbopelvic stability with SL standing strengthening, but does fatigue quickly in the Rt glutes. Her hip abductor strength has improved compared to previous assessment, but remains weak. Mild difficulty maintaining lumbopelvic stability with bridge and quadruped progression.   EVAL: Patient is a 68 y.o. female who was seen today for physical therapy evaluation and treatment for acute on  chronic Rt hip pain. She reports worsening of anterolateral hip pain that began about 2 weeks ago when she increased her walking speed during her typical daily walking routine. Upon assessment she is noted to have normal ROM about the hip reporting pain with active abduction and IR. She has significant hip abductor weakness with trendelenburg gait noted on the RLE. She will benefit from skilled PT to address the above stated deficits in order to optimize her function and assist with overall pain reduction.   OBJECTIVE IMPAIRMENTS: Abnormal gait, decreased activity tolerance, decreased balance, decreased knowledge of condition, difficulty walking, decreased strength, improper body mechanics, and pain.   ACTIVITY LIMITATIONS: carrying, lifting, bending, sitting, stairs, transfers, and locomotion  level  PARTICIPATION LIMITATIONS: meal prep,  cleaning, laundry, shopping, community activity, yard work, and recreation   PERSONAL FACTORS: Age, Time since onset of injury/illness/exacerbation, and 1 comorbidity: osteoarthritis  are also affecting patient's functional outcome.   REHAB POTENTIAL: Good  CLINICAL DECISION MAKING: Stable/uncomplicated  EVALUATION COMPLEXITY: Low   GOALS: Goals reviewed with patient? Yes  SHORT TERM GOALS: Target date: 09/13/2023  Patient will be independent and compliant with initial HEP.  Baseline: issued at eval.  Goal status: MET  2.  Patient will demonstrate pain free Rt hip AROM to improve ability to complete reaching and bending activity.  Baseline: pain with IR and abduction AROM Goal status: partially met   3.  Patient will be able to maintain SLS on the RLE with no pelvic drop to improve gait stability.  Baseline: Trendelenburg  09/06/23: unable to maintain level pelvis without UE support  09/26/23: negative Trendelenburg  Goal status: MET   LONG TERM GOALS: Target date: 11/11/23  Patient will score at least 64% on FOTO to signify clinically meaningful improvement in functional abilities.  Baseline: see above  Goal status: MET  2.  Patient will demonstrate at least 4-/5 Rt hip abductor strength to improve stability with walking.  Baseline: see above Goal status: progressing   3.  Patient will report pain at worst rated as </=2/10 to reduce current functional limitations.   Baseline: 5 Goal status: progressing    PLAN:  PT FREQUENCY: 2x/week  PT DURATION: 6 weeks  PLANNED INTERVENTIONS: 97164- PT Re-evaluation, 97110-Therapeutic exercises, 97530- Therapeutic activity, [id number]- Neuromuscular re-education, 97535- Self Care, 11914- Manual therapy, [id number]- Gait training, [id number]- Aquatic Therapy, 97014- Electrical stimulation (unattended), [id number]- Electrical stimulation (manual), [id number]- Ionotophoresis 4mg /ml Dexamethasone, Dry Needling,  Cryotherapy, and Moist heat  PLAN FOR NEXT SESSION: Review HEP as needed; progress glute med/HS strength as tolerated. Hip strengthening; single leg exercises/activities  Letitia Libra, PT, DPT, ATC 10/20/23 10:17 AM

## 2023-10-20 NOTE — Progress Notes (Signed)
Pt here for TOC for BV. Self swab done.

## 2023-10-23 LAB — CERVICOVAGINAL ANCILLARY ONLY
Bacterial Vaginitis (gardnerella): NEGATIVE
Comment: NEGATIVE

## 2023-10-24 ENCOUNTER — Ambulatory Visit: Payer: Medicare PPO

## 2023-10-24 DIAGNOSIS — M6281 Muscle weakness (generalized): Secondary | ICD-10-CM

## 2023-10-24 DIAGNOSIS — M25651 Stiffness of right hip, not elsewhere classified: Secondary | ICD-10-CM

## 2023-10-24 DIAGNOSIS — M25551 Pain in right hip: Secondary | ICD-10-CM | POA: Diagnosis not present

## 2023-10-24 DIAGNOSIS — R2689 Other abnormalities of gait and mobility: Secondary | ICD-10-CM | POA: Diagnosis not present

## 2023-10-24 DIAGNOSIS — M5459 Other low back pain: Secondary | ICD-10-CM | POA: Diagnosis not present

## 2023-10-24 NOTE — Therapy (Signed)
 OUTPATIENT PHYSICAL THERAPY LOWER EXTREMITY TREATMENT    Patient Name: TIAH HECKEL MRN: [id number] DOB:05/09/1956, 68 y.o., female Today's Date: 10/24/2023  END OF SESSION:  PT End of Session - 10/24/23 1019     Visit Number 16    Number of Visits 21    Date for PT Re-Evaluation 11/11/23    Authorization Type [employer]    Authorization Time Period 3/4-11/11/23    Authorization - Visit Number 5    Authorization - Number of Visits 9    PT Start Time 1020    PT Stop Time 1058    PT Time Calculation (min) 38 min    Activity Tolerance Patient tolerated treatment well    Behavior During Therapy [facility] for tasks assessed/performed             Past Medical History:  Diagnosis Date   Arthritis    Concussion 09/12/2019   Past Surgical History:  Procedure Laterality Date   APPENDECTOMY  1968   DILATION AND CURETTAGE OF UTERUS  1990   TONSILLECTOMY  1980   Patient Active Problem List   Diagnosis Date Noted   Osteoporosis 10/11/2023   Right hip pain 08/17/2023   Greater trochanteric bursitis, right 05/31/2023   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 02/03/2023   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   Elevated LDL cholesterol level 04/14/2021   Polyarthralgia 03/17/2021   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, right trigger thumb 04/24/2018   Numbness of right third and fourth toes 03/19/2018   Primary osteoarthritis of both hips 12/08/2015   Headache 08/12/2010   Lumbar degenerative disc disease 01/07/2008    PCP: Agapito Games, MD   REFERRING PROVIDER: Monica Becton, MD   REFERRING DIAG: M25.551 (ICD-10-CM) - Right hip pain   THERAPY DIAG:  Pain in right hip  Other abnormalities of gait and mobility  Muscle weakness (generalized)  Stiffness of right hip, not elsewhere classified  Rationale for Evaluation and Treatment: Rehabilitation  ONSET DATE: May 2024 with recent worsening January 2025    SUBJECTIVE:   SUBJECTIVE STATEMENT: Patient reports she has MRI scheduled on 10/30/23. Patient states her L posterior hip is bothering her more than R side; states Sunday her R side "hurt all the way from low back to sits bone" and then the next day R side was fine and L side bothered her. Patient states she has been doing less and sticking to her 6 exercises from HEP, pilates once a week, and walking or riding bike at gym twice a week.   EVAL: Patient reports she tore her hamstring in May 2024 and wasn't diagnosed until June. She had PT for this injury. The hamstring pain is still there, but not as bad. She is now having worsening pain along the side/front of hip that started about 2 weeks ago. She reports that it was swollen. The hip was so painful that she couldn't abduct the hip. She increased her walking speed right around the time that this pain began in the front/side of the hip, so is unsure if this was the cause of her new area of pain. She saw Dr. Karie Schwalbe and had an injection, which seems to have helped with her pain. Rotational movement or transitioning from sit to stand still bothers the hip. Prior to this pain she was walking daily about 3 miles.   PERTINENT HISTORY: Rt hip injection 08/17/23 PAIN:  Are you having pain? Yes: NPRS scale: 2/10 Pain  location: Rt buttock and lateral thigh Pain description: stabbing, sitting on rocks Aggravating factors: rotational movement, sitting, transitioning from sitting  Relieving factors: injection,rest  PRECAUTIONS: None  RED FLAGS: None   WEIGHT BEARING RESTRICTIONS: No  FALLS:  Has patient fallen in last 6 months? No  LIVING ENVIRONMENT: Lives with: lives with their spouse Lives in: House/apartment Stairs: Yes: Internal: 13 steps; on left going up Has following equipment at home: None  OCCUPATION: retired   PLOF: Independent  PATIENT GOALS: "I would love to have no pain and resume back to normal; make it more manageable."   NEXT MD  VISIT: 10/19/2023  OBJECTIVE:  Note: Objective measures were completed at Evaluation unless otherwise noted.  DIAGNOSTIC FINDINGS:  Rt Hip MRI: IMPRESSION: 1. Right hamstring tendinosis with a full-thickness minimally retracted tear of the biceps femoris tendon origin with peritendinous edema. 2. Moderate volume fluid within the right ischiogluteal bursa. 3. Mild tendinosis of the contralateral left hamstring origin. 4. Mild tendinosis of the bilateral gluteus medius and minimus tendons without tear. 5. Mild osteoarthritis of the right hip.  PATIENT SURVEYS:  FOTO 56% function; 64% predicted  09/26/23: 73% function   COGNITION: Overall cognitive status: Within functional limits for tasks assessed     SENSATION: Not tested  EDEMA:  Not inspected   MUSCLE LENGTH: Not tested   POSTURE: No Significant postural limitations  PALPATION: TTP Rt greater trochanter, lateral gluteals  LOWER EXTREMITY ROM:  Active ROM Right eval Left eval 09/06/23 Right  09/26/23 Right   Hip flexion Full   Full  Full dull pain anterolateral hip with lowering   Hip extension      Hip abduction Full pain   Full pain anterior hip/thigh Full   Hip adduction      Hip internal rotation 30 pain   28 pain 30 twinge at greater trochanter  Hip external rotation 28  30 35 sharp pain lateral thigh   Knee flexion      Knee extension      Ankle dorsiflexion      Ankle plantarflexion      Ankle inversion      Ankle eversion       (Blank rows = not tested)  LOWER EXTREMITY MMT:  MMT Right eval Left eval 09/06/23 Right  09/26/23 Right  10/20/23 Right   Hip flexion 4+ 4  4   Hip extension 4 5  4    Hip abduction 3- pain anterior hip 5 3+ pain anterolateral hip 3+ pain greater trochanter 4-   Hip adduction 5      Hip internal rotation 4+ "twinge" 4  4+ twinge greater trochanter   Hip external rotation 5 5  5    Knee flexion 5 5  In prone 4 biased to bicep femoris    Knee extension 5 5     Ankle  dorsiflexion       Ankle plantarflexion       Ankle inversion       Ankle eversion        (Blank rows = not tested)  LOWER EXTREMITY SPECIAL TESTS:  Not tested   FUNCTIONAL TESTS:  Not tested   GAIT: Distance walked: 20 ft  Assistive device utilized: None Level of assistance: Complete Independence Comments: Trendelenburg Rt  [facility] Adult PT Treatment:  DATE: 10/24/2023 Therapeutic Exercise: Seated figure 4 stretch 1/2 long sitting: Adductor stretch HS stretch  Quadruped: Rock back Hip CARs Standing hip abd x10 (B) Incline side plank at wall --> lateral hip shift towards/away from wall (oblique activation) Neuromuscular re-ed: Standing small range press down 7.5#pulley --> front & side ER (pilates variation) Standing airplane (hip IR/ER)    [facility] Adult PT Treatment:                                                DATE: 10/20/23 Neuromuscular re-ed: Standing hip abduction 2 x 10  Standing hip extension 2 x 10  Hip bridge march 2 x 10  Fire hydrant 2 x 10    [facility] Adult PT Treatment:                                                DATE: 10/12/23 Therapeutic Exercise: Reviewed, performed, and updated HEP discussing frequency, sets, and reps Discussed implementing core/hip strengthening in pilates focusing on form/control with pain free activity  Reviewed appropriate classes to trial at [facility]  Manual Therapy: Skilled palpation of trigger points Trigger Point Dry Needling  Subsequent Treatment: Instructions provided previously at initial dry needling treatment.   Patient Verbal Consent Given: Yes Education Handout Provided: Previously Provided Muscles Treated: Rt gluteals  Electrical Stimulation Performed: No Treatment Response/Outcome: patient reported deep twitch response and pain reduction   Self Care: Discussed importance of rest/recovery days to allow for tissue healing/adaption to loading  Ice for swelling/pain  control     PATIENT EDUCATION:  Education details: HEP update  Person educated: Patient Education method: Explanation, Demonstration, and Handouts, cues  Education comprehension: verbalized understanding, returned demo, cues   HOME EXERCISE PROGRAM: Access Code: [email or web address] URL: [email or web address] Date: 10/20/2023 Prepared by: Letitia Libra  Exercises - Quadruped Alternating Leg Extensions  - 1 x daily - 3 x weekly - 2 sets - 10 reps - Sidelying Hip Abduction  - 1 x daily - 3 x weekly - 2 sets - 10 reps - Modified Single-Leg Deadlift  - 1 x daily - 3 x weekly - 3 sets - 10 reps - Standing Hip Extension with Counter Support  - 1 x daily - 3 x weekly - 2 sets - 10 reps - Standing Hip Abduction with Counter Support  - 1 x daily - 3 x weekly - 2 sets - 10 reps - Marching Bridge  - 1 x daily - 3 x weekly - 2 sets - 10 reps  ASSESSMENT:  CLINICAL IMPRESSION: Glute strengthening with focus on single leg exercises continued; tactile cues improved glute activation and hip stabilization when stabilizing on R LE. Dynamic hip rotation with single leg balance added to challenge postural stability and awareness. Patient continues to have discomfort along lateral R thigh with hip abduction.  EVAL: Patient is a 68 y.o. female who was seen today for physical therapy evaluation and treatment for acute on  chronic Rt hip pain. She reports worsening of anterolateral hip pain that began about 2 weeks ago when she increased her walking speed during her typical daily walking routine. Upon assessment she is noted to have normal ROM about the hip reporting pain with active abduction and IR.  She has significant hip abductor weakness with trendelenburg gait noted on the RLE. She will benefit from skilled PT to address the above stated deficits in order to optimize her function and assist with overall pain reduction.   OBJECTIVE IMPAIRMENTS: Abnormal gait, decreased activity tolerance, decreased  balance, decreased knowledge of condition, difficulty walking, decreased strength, improper body mechanics, and pain.   ACTIVITY LIMITATIONS: carrying, lifting, bending, sitting, stairs, transfers, and locomotion level  PARTICIPATION LIMITATIONS: meal prep, cleaning, laundry, shopping, community activity, yard work, and recreation   PERSONAL FACTORS: Age, Time since onset of injury/illness/exacerbation, and 1 comorbidity: osteoarthritis  are also affecting patient's functional outcome.   REHAB POTENTIAL: Good  CLINICAL DECISION MAKING: Stable/uncomplicated  EVALUATION COMPLEXITY: Low   GOALS: Goals reviewed with patient? Yes  SHORT TERM GOALS: Target date: 09/13/2023  Patient will be independent and compliant with initial HEP.  Baseline: issued at eval.  Goal status: MET  2.  Patient will demonstrate pain free Rt hip AROM to improve ability to complete reaching and bending activity.  Baseline: pain with IR and abduction AROM Goal status: partially met   3.  Patient will be able to maintain SLS on the RLE with no pelvic drop to improve gait stability.  Baseline: Trendelenburg  09/06/23: unable to maintain level pelvis without UE support  09/26/23: negative Trendelenburg  Goal status: MET   LONG TERM GOALS: Target date: 11/11/23  Patient will score at least 64% on FOTO to signify clinically meaningful improvement in functional abilities.  Baseline: see above  Goal status: MET  2.  Patient will demonstrate at least 4-/5 Rt hip abductor strength to improve stability with walking.  Baseline: see above Goal status: progressing   3.  Patient will report pain at worst rated as </=2/10 to reduce current functional limitations.   Baseline: 5 Goal status: progressing    PLAN:  PT FREQUENCY: 2x/week  PT DURATION: 6 weeks  PLANNED INTERVENTIONS: 97164- PT Re-evaluation, 97110-Therapeutic exercises, 97530- Therapeutic activity, [id number]- Neuromuscular re-education, 97535- Self Care,  97140- Manual therapy, [id number]- Gait training, 40981- Aquatic Therapy, 97014- Electrical stimulation (unattended), [id number]- Electrical stimulation (manual), [id number]- Ionotophoresis 4mg /ml Dexamethasone, Dry Needling, Cryotherapy, and Moist heat  PLAN FOR NEXT SESSION: Review HEP as needed; progress glute med/HS strength as tolerated. Hip strengthening; single leg exercises/activities  Carlynn Herald, PTA 10/24/23 10:59 AM

## 2023-10-26 ENCOUNTER — Encounter: Payer: Self-pay | Admitting: Obstetrics & Gynecology

## 2023-10-26 ENCOUNTER — Ambulatory Visit: Payer: Medicare PPO

## 2023-10-26 DIAGNOSIS — R2689 Other abnormalities of gait and mobility: Secondary | ICD-10-CM

## 2023-10-26 DIAGNOSIS — M25651 Stiffness of right hip, not elsewhere classified: Secondary | ICD-10-CM | POA: Diagnosis not present

## 2023-10-26 DIAGNOSIS — M25551 Pain in right hip: Secondary | ICD-10-CM | POA: Diagnosis not present

## 2023-10-26 DIAGNOSIS — M5459 Other low back pain: Secondary | ICD-10-CM | POA: Diagnosis not present

## 2023-10-26 DIAGNOSIS — M6281 Muscle weakness (generalized): Secondary | ICD-10-CM | POA: Diagnosis not present

## 2023-10-26 NOTE — Therapy (Signed)
 OUTPATIENT PHYSICAL THERAPY LOWER EXTREMITY TREATMENT    Patient Name: Becky Bowers MRN: [id number] DOB:[birth year], 68 y.o., female Today's Date: 10/26/2023  END OF SESSION:  PT End of Session - 10/26/23 1018     Visit Number 17    Number of Visits 21    Date for PT Re-Evaluation 11/11/23    Authorization Type [employer]    Authorization Time Period 3/4-11/11/23    Authorization - Visit Number 6    Authorization - Number of Visits 9    PT Start Time 1016    PT Stop Time 1101    PT Time Calculation (min) 45 min    Activity Tolerance Patient tolerated treatment well    Behavior During Therapy [facility] for tasks assessed/performed             Past Medical History:  Diagnosis Date   Arthritis    Concussion 09/12/2019   Past Surgical History:  Procedure Laterality Date   APPENDECTOMY  1968   DILATION AND CURETTAGE OF UTERUS  1990   TONSILLECTOMY  1980   Patient Active Problem List   Diagnosis Date Noted   Osteoporosis 10/11/2023   Right hip pain 08/17/2023   Greater trochanteric bursitis, right 05/31/2023   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 02/03/2023   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   Elevated LDL cholesterol level 04/14/2021   Polyarthralgia 03/17/2021   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, right trigger thumb 04/24/2018   Numbness of right third and fourth toes 03/19/2018   Primary osteoarthritis of both hips 12/08/2015   Headache 08/12/2010   Lumbar degenerative disc disease 01/07/2008    PCP: Agapito Games, MD   REFERRING PROVIDER: Monica Becton, MD   REFERRING DIAG: M25.551 (ICD-10-CM) - Right hip pain   THERAPY DIAG:  Pain in right hip  Other abnormalities of gait and mobility  Muscle weakness (generalized)  Stiffness of right hip, not elsewhere classified  Rationale for Evaluation and Treatment: Rehabilitation  ONSET DATE: May 2024 with recent worsening January 2025    SUBJECTIVE:   SUBJECTIVE STATEMENT: Patient reports she has soreness/stiffness across low back and continued pain along R lateral thigh. Patient states she went to [facility] and had no soreness, pain or swelling afterwards.  EVAL: Patient reports she tore her hamstring in May 2024 and wasn't diagnosed until June. She had PT for this injury. The hamstring pain is still there, but not as bad. She is now having worsening pain along the side/front of hip that started about 2 weeks ago. She reports that it was swollen. The hip was so painful that she couldn't abduct the hip. She increased her walking speed right around the time that this pain began in the front/side of the hip, so is unsure if this was the cause of her new area of pain. She saw Dr. Karie Schwalbe and had an injection, which seems to have helped with her pain. Rotational movement or transitioning from sit to stand still bothers the hip. Prior to this pain she was walking daily about 3 miles.   PERTINENT HISTORY: Rt hip injection 08/17/23 PAIN:  Are you having pain? Yes: NPRS scale: 2/10 Pain location: Rt buttock and lateral thigh Pain description: stabbing, sitting on rocks Aggravating factors: rotational movement, sitting, transitioning from sitting  Relieving factors: injection,rest  PRECAUTIONS: None  RED FLAGS: None   WEIGHT BEARING RESTRICTIONS: No  FALLS:  Has patient fallen in last 6 months? No  LIVING  ENVIRONMENT: Lives with: lives with their spouse Lives in: House/apartment Stairs: Yes: Internal: 13 steps; on left going up Has following equipment at home: None  OCCUPATION: retired   PLOF: Independent  PATIENT GOALS: "I would love to have no pain and resume back to normal; make it more manageable."   NEXT MD VISIT: 10/19/2023  OBJECTIVE:  Note: Objective measures were completed at Evaluation unless otherwise noted.  DIAGNOSTIC FINDINGS:  Rt Hip MRI: IMPRESSION: 1. Right hamstring tendinosis with a full-thickness  minimally retracted tear of the biceps femoris tendon origin with peritendinous edema. 2. Moderate volume fluid within the right ischiogluteal bursa. 3. Mild tendinosis of the contralateral left hamstring origin. 4. Mild tendinosis of the bilateral gluteus medius and minimus tendons without tear. 5. Mild osteoarthritis of the right hip.  PATIENT SURVEYS:  FOTO 56% function; 64% predicted  09/26/23: 73% function   COGNITION: Overall cognitive status: Within functional limits for tasks assessed     SENSATION: Not tested  EDEMA:  Not inspected   MUSCLE LENGTH: Not tested   POSTURE: No Significant postural limitations  PALPATION: TTP Rt greater trochanter, lateral gluteals  LOWER EXTREMITY ROM:  Active ROM Right eval Left eval 09/06/23 Right  09/26/23 Right   Hip flexion Full   Full  Full dull pain anterolateral hip with lowering   Hip extension      Hip abduction Full pain   Full pain anterior hip/thigh Full   Hip adduction      Hip internal rotation 30 pain   28 pain 30 twinge at greater trochanter  Hip external rotation 28  30 35 sharp pain lateral thigh   Knee flexion      Knee extension      Ankle dorsiflexion      Ankle plantarflexion      Ankle inversion      Ankle eversion       (Blank rows = not tested)  LOWER EXTREMITY MMT:  MMT Right eval Left eval 09/06/23 Right  09/26/23 Right  10/20/23 Right   Hip flexion 4+ 4  4   Hip extension 4 5  4    Hip abduction 3- pain anterior hip 5 3+ pain anterolateral hip 3+ pain greater trochanter 4-   Hip adduction 5      Hip internal rotation 4+ "twinge" 4  4+ twinge greater trochanter   Hip external rotation 5 5  5    Knee flexion 5 5  In prone 4 biased to bicep femoris    Knee extension 5 5     Ankle dorsiflexion       Ankle plantarflexion       Ankle inversion       Ankle eversion        (Blank rows = not tested)  LOWER EXTREMITY SPECIAL TESTS:  Not tested   FUNCTIONAL TESTS:  Not tested    GAIT: Distance walked: 20 ft  Assistive device utilized: None Level of assistance: Complete Independence Comments: Trendelenburg Rt   [facility] Adult PT Treatment:                                                DATE: 10/26/2023 Therapeutic Exercise: Seated figure 4 stretch Supine figure 4 LTR (R crossed on L, rocking to L) Bridges --> added small range single leg slide out/in at top of bridge (B) Pretzel pigeon  stretch Long sitting HS & adductor (straddle) stretches Wide leg LTR Neuromuscular re-ed: Standing: SLS + opp leg half arc slides front <--> back Airplane (SLS dynamic hip IR/ER) --> bilateral Hip CARs Quadruped cat/cow with RTB for tactile cue at mid-thoracic    [facility] Adult PT Treatment:                                                DATE: 10/24/2023 Therapeutic Exercise: Seated figure 4 stretch 1/2 long sitting: Adductor stretch HS stretch  Quadruped: Rock back Hip CARs Standing hip abd x10 (B) Incline side plank at wall --> lateral hip shift towards/away from wall (oblique activation) Neuromuscular re-ed: Standing small range press down 7.5#pulley --> front & side ER (pilates variation) Standing airplane (hip IR/ER)    [facility] Adult PT Treatment:                                                DATE: 10/20/23 Neuromuscular re-ed: Standing hip abduction 2 x 10  Standing hip extension 2 x 10  Hip bridge march 2 x 10  Fire hydrant 2 x 10    [facility] Adult PT Treatment:                                                DATE: 10/12/23 Therapeutic Exercise: Reviewed, performed, and updated HEP discussing frequency, sets, and reps Discussed implementing core/hip strengthening in pilates focusing on form/control with pain free activity  Reviewed appropriate classes to trial at [facility]  Manual Therapy: Skilled palpation of trigger points Trigger Point Dry Needling  Subsequent Treatment: Instructions provided previously at initial dry needling treatment.   Patient  Verbal Consent Given: Yes Education Handout Provided: Previously Provided Muscles Treated: Rt gluteals  Electrical Stimulation Performed: No Treatment Response/Outcome: patient reported deep twitch response and pain reduction   Self Care: Discussed importance of rest/recovery days to allow for tissue healing/adaption to loading  Ice for swelling/pain control     PATIENT EDUCATION:  Education details: HEP update  Person educated: Patient Education method: Explanation, Demonstration, and Handouts, cues  Education comprehension: verbalized understanding, returned demo, cues   HOME EXERCISE PROGRAM: Access Code: [email or web address] URL: [email or web address] Date: 10/20/2023 Prepared by: Letitia Libra  Exercises - Quadruped Alternating Leg Extensions  - 1 x daily - 3 x weekly - 2 sets - 10 reps - Sidelying Hip Abduction  - 1 x daily - 3 x weekly - 2 sets - 10 reps - Modified Single-Leg Deadlift  - 1 x daily - 3 x weekly - 3 sets - 10 reps - Standing Hip Extension with Counter Support  - 1 x daily - 3 x weekly - 2 sets - 10 reps - Standing Hip Abduction with Counter Support  - 1 x daily - 3 x weekly - 2 sets - 10 reps - Marching Bridge  - 1 x daily - 3 x weekly - 2 sets - 10 reps  ASSESSMENT:  CLINICAL IMPRESSION: Glute stretches added, focusing on releasing glute max. Resistance band added at mid-thoracic to provide proprioceptive feedback and awareness of thoracic mobility  and decrease lumbar tension. Dynamic single leg exercises for balance and hip strengthening continued with no exacerbation of pain.   EVAL: Patient is a 68 y.o. female who was seen today for physical therapy evaluation and treatment for acute on  chronic Rt hip pain. She reports worsening of anterolateral hip pain that began about 2 weeks ago when she increased her walking speed during her typical daily walking routine. Upon assessment she is noted to have normal ROM about the hip reporting pain with active  abduction and IR. She has significant hip abductor weakness with trendelenburg gait noted on the RLE. She will benefit from skilled PT to address the above stated deficits in order to optimize her function and assist with overall pain reduction.   OBJECTIVE IMPAIRMENTS: Abnormal gait, decreased activity tolerance, decreased balance, decreased knowledge of condition, difficulty walking, decreased strength, improper body mechanics, and pain.   ACTIVITY LIMITATIONS: carrying, lifting, bending, sitting, stairs, transfers, and locomotion level  PARTICIPATION LIMITATIONS: meal prep, cleaning, laundry, shopping, community activity, yard work, and recreation   PERSONAL FACTORS: Age, Time since onset of injury/illness/exacerbation, and 1 comorbidity: osteoarthritis  are also affecting patient's functional outcome.   REHAB POTENTIAL: Good  CLINICAL DECISION MAKING: Stable/uncomplicated  EVALUATION COMPLEXITY: Low   GOALS: Goals reviewed with patient? Yes  SHORT TERM GOALS: Target date: 09/13/2023  Patient will be independent and compliant with initial HEP.  Baseline: issued at eval.  Goal status: MET  2.  Patient will demonstrate pain free Rt hip AROM to improve ability to complete reaching and bending activity.  Baseline: pain with IR and abduction AROM Goal status: partially met   3.  Patient will be able to maintain SLS on the RLE with no pelvic drop to improve gait stability.  Baseline: Trendelenburg  09/06/23: unable to maintain level pelvis without UE support  09/26/23: negative Trendelenburg  Goal status: MET   LONG TERM GOALS: Target date: 11/11/23  Patient will score at least 64% on FOTO to signify clinically meaningful improvement in functional abilities.  Baseline: see above  Goal status: MET  2.  Patient will demonstrate at least 4-/5 Rt hip abductor strength to improve stability with walking.  Baseline: see above Goal status: progressing   3.  Patient will report pain at  worst rated as </=2/10 to reduce current functional limitations.   Baseline: 5 Goal status: progressing    PLAN:  PT FREQUENCY: 2x/week  PT DURATION: 6 weeks  PLANNED INTERVENTIONS: 97164- PT Re-evaluation, 97110-Therapeutic exercises, 97530- Therapeutic activity, [id number]- Neuromuscular re-education, 97535- Self Care, 95621- Manual therapy, [id number]- Gait training, [id number]- Aquatic Therapy, 97014- Electrical stimulation (unattended), [id number]- Electrical stimulation (manual), [id number]- Ionotophoresis 4mg /ml Dexamethasone, Dry Needling, Cryotherapy, and Moist heat  PLAN FOR NEXT SESSION: Dry needle next session? progress glute med/HS strength as tolerated. Hip strengthening; single leg exercises/activities  Carlynn Herald, PTA 10/26/23 11:03 AM

## 2023-10-28 ENCOUNTER — Encounter: Payer: Self-pay | Admitting: Sports Medicine

## 2023-10-30 ENCOUNTER — Ambulatory Visit

## 2023-10-30 DIAGNOSIS — M6281 Muscle weakness (generalized): Secondary | ICD-10-CM

## 2023-10-30 DIAGNOSIS — M51362 Other intervertebral disc degeneration, lumbar region with discogenic back pain and lower extremity pain: Secondary | ICD-10-CM | POA: Diagnosis not present

## 2023-10-30 DIAGNOSIS — R2689 Other abnormalities of gait and mobility: Secondary | ICD-10-CM | POA: Diagnosis not present

## 2023-10-30 DIAGNOSIS — M5136 Other intervertebral disc degeneration, lumbar region with discogenic back pain only: Secondary | ICD-10-CM | POA: Diagnosis not present

## 2023-10-30 DIAGNOSIS — M47816 Spondylosis without myelopathy or radiculopathy, lumbar region: Secondary | ICD-10-CM | POA: Diagnosis not present

## 2023-10-30 DIAGNOSIS — M5459 Other low back pain: Secondary | ICD-10-CM | POA: Diagnosis not present

## 2023-10-30 DIAGNOSIS — M25551 Pain in right hip: Secondary | ICD-10-CM | POA: Diagnosis not present

## 2023-10-30 DIAGNOSIS — M48061 Spinal stenosis, lumbar region without neurogenic claudication: Secondary | ICD-10-CM | POA: Diagnosis not present

## 2023-10-30 DIAGNOSIS — M25651 Stiffness of right hip, not elsewhere classified: Secondary | ICD-10-CM | POA: Diagnosis not present

## 2023-10-30 DIAGNOSIS — M4316 Spondylolisthesis, lumbar region: Secondary | ICD-10-CM | POA: Diagnosis not present

## 2023-10-30 NOTE — Therapy (Signed)
 OUTPATIENT PHYSICAL THERAPY LOWER EXTREMITY TREATMENT    Patient Name: DANETTA PROM MRN: [id number] DOB:1956/06/17, 68 y.o., female Today's Date: 10/30/2023  END OF SESSION:  PT End of Session - 10/30/23 1058     Visit Number 18    Number of Visits 21    Date for PT Re-Evaluation 11/11/23    Authorization Type [employer]    Authorization Time Period 3/4-11/11/23    Authorization - Visit Number 7    Authorization - Number of Visits 8    Progress Note Due on Visit 19    PT Start Time 1100    PT Stop Time 1143    PT Time Calculation (min) 43 min    Activity Tolerance Patient tolerated treatment well    Behavior During Therapy [facility] for tasks assessed/performed              Past Medical History:  Diagnosis Date   Arthritis    Concussion 09/12/2019   Past Surgical History:  Procedure Laterality Date   APPENDECTOMY  1968   DILATION AND CURETTAGE OF UTERUS  1990   TONSILLECTOMY  1980   Patient Active Problem List   Diagnosis Date Noted   Osteoporosis 10/11/2023   Right hip pain 08/17/2023   Greater trochanteric bursitis, right 05/31/2023   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 02/03/2023   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   Elevated LDL cholesterol level 04/14/2021   Polyarthralgia 03/17/2021   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, right trigger thumb 04/24/2018   Numbness of right third and fourth toes 03/19/2018   Primary osteoarthritis of both hips 12/08/2015   Headache 08/12/2010   Lumbar degenerative disc disease 01/07/2008    PCP: Agapito Games, MD   REFERRING PROVIDER: Monica Becton, MD   REFERRING DIAG: M25.551 (ICD-10-CM) - Right hip pain   THERAPY DIAG:  Pain in right hip  Other abnormalities of gait and mobility  Muscle weakness (generalized)  Rationale for Evaluation and Treatment: Rehabilitation  ONSET DATE: May 2024 with recent worsening January 2025   SUBJECTIVE:    SUBJECTIVE STATEMENT: "Better for the past 3 days." Had MRI this morning for her back.   EVAL: Patient reports she tore her hamstring in May 2024 and wasn't diagnosed until June. She had PT for this injury. The hamstring pain is still there, but not as bad. She is now having worsening pain along the side/front of hip that started about 2 weeks ago. She reports that it was swollen. The hip was so painful that she couldn't abduct the hip. She increased her walking speed right around the time that this pain began in the front/side of the hip, so is unsure if this was the cause of her new area of pain. She saw Dr. Karie Schwalbe and had an injection, which seems to have helped with her pain. Rotational movement or transitioning from sit to stand still bothers the hip. Prior to this pain she was walking daily about 3 miles.   PERTINENT HISTORY: Rt hip injection 08/17/23 PAIN:  Are you having pain? Yes: NPRS scale: 2/10 Pain location: Rt lateral thigh Pain description: stabbing Aggravating factors: rotational movement, sitting, transitioning from sitting  Relieving factors: injection,rest  PRECAUTIONS: None  RED FLAGS: None   WEIGHT BEARING RESTRICTIONS: No  FALLS:  Has patient fallen in last 6 months? No  LIVING ENVIRONMENT: Lives with: lives with their spouse Lives in: House/apartment Stairs: Yes: Internal: 13 steps; on left going up  Has following equipment at home: None  OCCUPATION: retired   PLOF: Independent  PATIENT GOALS: "I would love to have no pain and resume back to normal; make it more manageable."   NEXT MD VISIT: 10/19/2023  OBJECTIVE:  Note: Objective measures were completed at Evaluation unless otherwise noted.  DIAGNOSTIC FINDINGS:  Rt Hip MRI: IMPRESSION: 1. Right hamstring tendinosis with a full-thickness minimally retracted tear of the biceps femoris tendon origin with peritendinous edema. 2. Moderate volume fluid within the right ischiogluteal bursa. 3. Mild  tendinosis of the contralateral left hamstring origin. 4. Mild tendinosis of the bilateral gluteus medius and minimus tendons without tear. 5. Mild osteoarthritis of the right hip.  PATIENT SURVEYS:  FOTO 56% function; 64% predicted  09/26/23: 73% function   COGNITION: Overall cognitive status: Within functional limits for tasks assessed     SENSATION: Not tested  EDEMA:  Not inspected   MUSCLE LENGTH: Not tested   POSTURE: No Significant postural limitations  PALPATION: TTP Rt greater trochanter, lateral gluteals  LOWER EXTREMITY ROM:  Active ROM Right eval Left eval 09/06/23 Right  09/26/23 Right   Hip flexion Full   Full  Full dull pain anterolateral hip with lowering   Hip extension      Hip abduction Full pain   Full pain anterior hip/thigh Full   Hip adduction      Hip internal rotation 30 pain   28 pain 30 twinge at greater trochanter  Hip external rotation 28  30 35 sharp pain lateral thigh   Knee flexion      Knee extension      Ankle dorsiflexion      Ankle plantarflexion      Ankle inversion      Ankle eversion       (Blank rows = not tested)  LOWER EXTREMITY MMT:  MMT Right eval Left eval 09/06/23 Right  09/26/23 Right  10/20/23 Right   Hip flexion 4+ 4  4   Hip extension 4 5  4    Hip abduction 3- pain anterior hip 5 3+ pain anterolateral hip 3+ pain greater trochanter 4-   Hip adduction 5      Hip internal rotation 4+ "twinge" 4  4+ twinge greater trochanter   Hip external rotation 5 5  5    Knee flexion 5 5  In prone 4 biased to bicep femoris    Knee extension 5 5     Ankle dorsiflexion       Ankle plantarflexion       Ankle inversion       Ankle eversion        (Blank rows = not tested)  LOWER EXTREMITY SPECIAL TESTS:  Not tested   FUNCTIONAL TESTS:  Not tested   GAIT: Distance walked: 20 ft  Assistive device utilized: None Level of assistance: Complete Independence Comments: Trendelenburg Rt  [facility] Adult PT Treatment:                                                 DATE: 10/30/23 Therapeutic Exercise: Prone quad stretch x 1 minute  Manual Therapy: Skilled palpation of trigger points Trigger Point Dry Needling  Subsequent Treatment: Instructions reviewed, if requested by the patient, prior to subsequent dry needling treatment.   Patient Verbal Consent Given: Yes Education Handout Provided: Previously Provided Muscles Treated: Rt vastus lateralis  Electrical Stimulation Performed: No Treatment Response/Outcome: twitch response elicited    Neuromuscular re-ed: Sidelying hip abduction x 10, x 10 @ 1 lb  Bird dog 2 x 10  Standing hip flexion on airex 2 x 10  Standing hip abduction on airex x 10 each   Self Care: Walking progression to begin outdoor walking   [facility] Adult PT Treatment:                                                DATE: 10/26/2023 Therapeutic Exercise: Seated figure 4 stretch Supine figure 4 LTR (R crossed on L, rocking to L) Bridges --> added small range single leg slide out/in at top of bridge (B) Pretzel pigeon stretch Long sitting HS & adductor (straddle) stretches Wide leg LTR Neuromuscular re-ed: Standing: SLS + opp leg half arc slides front <--> back Airplane (SLS dynamic hip IR/ER) --> bilateral Hip CARs Quadruped cat/cow with RTB for tactile cue at mid-thoracic    [facility] Adult PT Treatment:                                                DATE: 10/24/2023 Therapeutic Exercise: Seated figure 4 stretch 1/2 long sitting: Adductor stretch HS stretch  Quadruped: Rock back Hip CARs Standing hip abd x10 (B) Incline side plank at wall --> lateral hip shift towards/away from wall (oblique activation) Neuromuscular re-ed: Standing small range press down 7.5#pulley --> front & side ER (pilates variation) Standing airplane (hip IR/ER)     PATIENT EDUCATION:  Education details: verbal update to HEP to add 1 lb weight to abduction, walking program  Person educated: Patient Education  method: Explanation, cues  Education comprehension: verbalized understanding,  HOME EXERCISE PROGRAM: Access Code: [email or web address] URL: [email or web address] Date: 10/20/2023 Prepared by: Letitia Libra  Exercises - Quadruped Alternating Leg Extensions  - 1 x daily - 3 x weekly - 2 sets - 10 reps - Sidelying Hip Abduction  - 1 x daily - 3 x weekly - 2 sets - 10 reps - Modified Single-Leg Deadlift  - 1 x daily - 3 x weekly - 3 sets - 10 reps - Standing Hip Extension with Counter Support  - 1 x daily - 3 x weekly - 2 sets - 10 reps - Standing Hip Abduction with Counter Support  - 1 x daily - 3 x weekly - 2 sets - 10 reps - Marching Bridge  - 1 x daily - 3 x weekly - 2 sets - 10 reps  ASSESSMENT:  CLINICAL IMPRESSION: TPDN performed to Rt vastus lateralis with excellent twitch response elicited. Focused on progression of lumbopelvic strengthening with good tolerance. Challenged with quadruped progression with mild difficulty maintaining stability with bird dog. Introduced standing hip strengthening on unstable surface with notable difference in stability/strength on the RLE vs. LLE during SL tasks. Lateral thigh pain resolved throughout session.   EVAL: Patient is a 68 y.o. female who was seen today for physical therapy evaluation and treatment for acute on  chronic Rt hip pain. She reports worsening of anterolateral hip pain that began about 2 weeks ago when she increased her walking speed during her typical daily walking routine. Upon assessment she is noted to have normal ROM about the  hip reporting pain with active abduction and IR. She has significant hip abductor weakness with trendelenburg gait noted on the RLE. She will benefit from skilled PT to address the above stated deficits in order to optimize her function and assist with overall pain reduction.   OBJECTIVE IMPAIRMENTS: Abnormal gait, decreased activity tolerance, decreased balance, decreased knowledge of condition,  difficulty walking, decreased strength, improper body mechanics, and pain.   ACTIVITY LIMITATIONS: carrying, lifting, bending, sitting, stairs, transfers, and locomotion level  PARTICIPATION LIMITATIONS: meal prep, cleaning, laundry, shopping, community activity, yard work, and recreation   PERSONAL FACTORS: Age, Time since onset of injury/illness/exacerbation, and 1 comorbidity: osteoarthritis  are also affecting patient's functional outcome.   REHAB POTENTIAL: Good  CLINICAL DECISION MAKING: Stable/uncomplicated  EVALUATION COMPLEXITY: Low   GOALS: Goals reviewed with patient? Yes  SHORT TERM GOALS: Target date: 09/13/2023  Patient will be independent and compliant with initial HEP.  Baseline: issued at eval.  Goal status: MET  2.  Patient will demonstrate pain free Rt hip AROM to improve ability to complete reaching and bending activity.  Baseline: pain with IR and abduction AROM Goal status: partially met   3.  Patient will be able to maintain SLS on the RLE with no pelvic drop to improve gait stability.  Baseline: Trendelenburg  09/06/23: unable to maintain level pelvis without UE support  09/26/23: negative Trendelenburg  Goal status: MET   LONG TERM GOALS: Target date: 11/11/23  Patient will score at least 64% on FOTO to signify clinically meaningful improvement in functional abilities.  Baseline: see above  Goal status: MET  2.  Patient will demonstrate at least 4-/5 Rt hip abductor strength to improve stability with walking.  Baseline: see above Goal status: progressing   3.  Patient will report pain at worst rated as </=2/10 to reduce current functional limitations.   Baseline: 5 Goal status: progressing    PLAN:  PT FREQUENCY: 2x/week  PT DURATION: 6 weeks  PLANNED INTERVENTIONS: 97164- PT Re-evaluation, 97110-Therapeutic exercises, 97530- Therapeutic activity, [id number]- Neuromuscular re-education, 97535- Self Care, [id number]- Manual therapy, [id number]- Gait  training, [id number]- Aquatic Therapy, 97014- Electrical stimulation (unattended), [id number]- Electrical stimulation (manual), [id number]- Ionotophoresis 4mg /ml Dexamethasone, Dry Needling, Cryotherapy, and Moist heat  PLAN FOR NEXT SESSION: TPDN response; progress glute med/HS strength as tolerated. Hip strengthening; single leg exercises/activities; RE-EVAL  Keylan Costabile, PT, DPT, ATC 10/30/23 11:45 AM

## 2023-11-01 ENCOUNTER — Ambulatory Visit

## 2023-11-01 DIAGNOSIS — M5459 Other low back pain: Secondary | ICD-10-CM

## 2023-11-01 DIAGNOSIS — R2689 Other abnormalities of gait and mobility: Secondary | ICD-10-CM | POA: Diagnosis not present

## 2023-11-01 DIAGNOSIS — M25551 Pain in right hip: Secondary | ICD-10-CM

## 2023-11-01 DIAGNOSIS — M6281 Muscle weakness (generalized): Secondary | ICD-10-CM

## 2023-11-01 DIAGNOSIS — M25651 Stiffness of right hip, not elsewhere classified: Secondary | ICD-10-CM | POA: Diagnosis not present

## 2023-11-01 NOTE — Therapy (Addendum)
 OUTPATIENT PHYSICAL THERAPY LOWER EXTREMITY TREATMENT RE-EVALUATION  Progress Note Reporting Period 09/26/23 to 11/01/23  See note below for Objective Data and Assessment of Progress/Goals.        Patient Name: Becky Bowers MRN: [id number] DOB:[birth year], 68 y.o., female Today's Date: 11/01/2023  END OF SESSION:  PT End of Session - 11/01/23 1016     Visit Number 19    Number of Visits 31    Date for PT Re-Evaluation 12/16/23    Authorization Type [employer]    Authorization Time Period 3/4-11/11/23    Authorization - Visit Number 8    Authorization - Number of Visits 8    Progress Note Due on Visit 19    PT Start Time 1016    PT Stop Time 1100    PT Time Calculation (min) 44 min    Activity Tolerance Patient tolerated treatment well    Behavior During Therapy [facility] for tasks assessed/performed               Past Medical History:  Diagnosis Date   Arthritis    Concussion 09/12/2019   Past Surgical History:  Procedure Laterality Date   APPENDECTOMY  1968   DILATION AND CURETTAGE OF UTERUS  1990   TONSILLECTOMY  1980   Patient Active Problem List   Diagnosis Date Noted   Osteoporosis 10/11/2023   Right hip pain 08/17/2023   Greater trochanteric bursitis, right 05/31/2023   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 02/03/2023   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   Elevated LDL cholesterol level 04/14/2021   Polyarthralgia 03/17/2021   Primary osteoarthritis right first [facility] and right second MCP, right trigger thumb 04/24/2018   Numbness of right third and fourth toes 03/19/2018   Primary osteoarthritis of both hips 12/08/2015   Headache 08/12/2010   Lumbar degenerative disc disease 01/07/2008    PCP: Agapito Games, MD   REFERRING PROVIDER: Monica Becton, MD   REFERRING DIAG: M25.551 (ICD-10-CM) - Right hip pain   M51.362 (ICD-10-CM) - Degeneration of intervertebral disc of lumbar region with discogenic back  pain and lower extremity pain   THERAPY DIAG:  Pain in right hip  Other abnormalities of gait and mobility  Muscle weakness (generalized)  Other low back pain  Rationale for Evaluation and Treatment: Rehabilitation  ONSET DATE: May 2024 with recent worsening January 2025   SUBJECTIVE:   SUBJECTIVE STATEMENT: Patient reports overall the specific posterolateral hip pain that we were initially treating is better. Pain fluctuates around 1-2 in these areas. She walked in the neighborhood and felt the hamstring working, but was not painful. The low back was hurting after the walk. More recently she began noticing lateral thigh pain that began in early February of insidious onset. Back pain is fairly new and started about 2-3 weeks ago. She reports history of back pain from prolonged standing at work, but this pain feels different. It feels "twingy." She denies any numbness/tingling. No red flag symptoms. Back MRI was completed on Monday, results are still pending.   EVAL: Patient reports she tore her hamstring in May 2024 and wasn't diagnosed until June. She had PT for this injury. The hamstring pain is still there, but not as bad. She is now having worsening pain along the side/front of hip that started about 2 weeks ago. She reports that it was swollen. The hip was so painful that she couldn't abduct the hip. She increased her walking speed right around the time that  this pain began in the front/side of the hip, so is unsure if this was the cause of her new area of pain. She saw Dr. Karie Schwalbe and had an injection, which seems to have helped with her pain. Rotational movement or transitioning from sit to stand still bothers the hip. Prior to this pain she was walking daily about 3 miles.   PERTINENT HISTORY: Rt hip injection 08/17/23 Osteoporosis  PAIN:  Are you having pain? Yes: NPRS scale: 1/10 Pain location: Rt lateral thigh, ischial tuberosity Pain description: jabbing Aggravating factors:  rotational movement, sitting, transitioning from sitting  Relieving factors: injection,rest  PRECAUTIONS: None  RED FLAGS: None   WEIGHT BEARING RESTRICTIONS: No  FALLS:  Has patient fallen in last 6 months? No  LIVING ENVIRONMENT: Lives with: lives with their spouse Lives in: House/apartment Stairs: Yes: Internal: 13 steps; on left going up Has following equipment at home: None  OCCUPATION: retired   PLOF: Independent  PATIENT GOALS: "I would love to have no pain and resume back to normal; make it more manageable."   NEXT MD VISIT: 10/19/2023  OBJECTIVE:  Note: Objective measures were completed at Evaluation unless otherwise noted.  DIAGNOSTIC FINDINGS:  Rt Hip MRI: IMPRESSION: 1. Right hamstring tendinosis with a full-thickness minimally retracted tear of the biceps femoris tendon origin with peritendinous edema. 2. Moderate volume fluid within the right ischiogluteal bursa. 3. Mild tendinosis of the contralateral left hamstring origin. 4. Mild tendinosis of the bilateral gluteus medius and minimus tendons without tear. 5. Mild osteoarthritis of the right hip.  PATIENT SURVEYS:  FOTO 56% function; 64% predicted  09/26/23: 73% function   COGNITION: Overall cognitive status: Within functional limits for tasks assessed     SENSATION: Not tested  EDEMA:  Not inspected   MUSCLE LENGTH: Not tested   POSTURE: No Significant postural limitations  PALPATION: TTP Rt greater trochanter, lateral gluteals  11/01/23: gentle PAIVM with patient reporting pain isolated to the low back at L5-S1   LOWER EXTREMITY ROM:  Active ROM Right eval Left eval 09/06/23 Right  09/26/23 Right  11/01/23 Right    Hip flexion Full   Full  Full dull pain anterolateral hip with lowering  Full   Hip extension       Hip abduction Full pain   Full pain anterior hip/thigh Full    Hip adduction       Hip internal rotation 30 pain   28 pain 30 twinge at greater trochanter 30 mild pain  at greater trochanter   Hip external rotation 28  30 35 sharp pain lateral thigh  35 lateral thigh pain  Knee flexion       Knee extension       Ankle dorsiflexion       Ankle plantarflexion       Ankle inversion       Ankle eversion        (Blank rows = not tested)  LOWER EXTREMITY MMT:  MMT Right eval Left eval 09/06/23 Right  09/26/23 Right  10/20/23 Right  11/01/23  Hip flexion 4+ 4  4  Rt: 5 lateral thigh pain; Lt: 4+  Hip extension 4 5  4   Lt: 5; Rt: 4  Hip abduction 3- pain anterior hip 5 3+ pain anterolateral hip 3+ pain greater trochanter 4-  Lt: 4+; Rt: 4- mild glute pain  Hip adduction 5       Hip internal rotation 4+ "twinge" 4  4+ twinge greater trochanter  5 bilateral;  mild pain Rt greater trochanter  Hip external rotation 5 5  5  5  bilateral   Knee flexion 5 5  In prone 4 biased to bicep femoris     Knee extension 5 5      Ankle dorsiflexion        Ankle plantarflexion        Ankle inversion        Ankle eversion         (Blank rows = not tested) LUMBAR ROM:   Active  A/PROM  11/01/23  Flexion Finger tips to floor; pain in low back on ascent   Extension WNL low back pain  Right lateral flexion WNL lateral thigh pain  Left lateral flexion WNL low back pain  Right rotation WNL lateral hip pain  Left rotation WNL   (Blank rows = not tested)  SPECIAL TESTS:  11/01/23: (-) SLR   FUNCTIONAL TESTS:  Not tested   GAIT: Distance walked: 20 ft  Assistive device utilized: None Level of assistance: Complete Independence Comments: Trendelenburg Rt [facility] Adult PT Treatment:                                                DATE: 11/01/23 Therapeutic Exercise: Cat/cow x 5 Thread the needle x 5 Figure 4 stretch x 30 sec each  LTR x 5 Reviewed and updated HEP Manual Therapy: LAD RLE, no effect on pain  STM Rt gluteals    [facility] Adult PT Treatment:                                                DATE: 10/30/23 Therapeutic Exercise: Prone quad stretch x 1 minute   Manual Therapy: Skilled palpation of trigger points Trigger Point Dry Needling  Subsequent Treatment: Instructions reviewed, if requested by the patient, prior to subsequent dry needling treatment.   Patient Verbal Consent Given: Yes Education Handout Provided: Previously Provided Muscles Treated: Rt vastus lateralis  Electrical Stimulation Performed: No Treatment Response/Outcome: twitch response elicited    Neuromuscular re-ed: Sidelying hip abduction x 10, x 10 @ 1 lb  Bird dog 2 x 10  Standing hip flexion on airex 2 x 10  Standing hip abduction on airex x 10 each   Self Care: Walking progression to begin outdoor walking     PATIENT EDUCATION:  Education details: see treatment  Person educated: Patient Education method: Programmer, multimedia, cues, demo, handout  Education comprehension: verbalized understanding,returned demo, cues   HOME EXERCISE PROGRAM: Access Code: [email or web address] URL: [email or web address] Date: 11/01/2023 Prepared by: Letitia Libra  Exercises - Quadruped Alternating Leg Extensions  - 1 x daily - 3 x weekly - 2 sets - 10 reps - Sidelying Hip Abduction  - 1 x daily - 3 x weekly - 2 sets - 10 reps - Modified Single-Leg Deadlift  - 1 x daily - 3 x weekly - 3 sets - 10 reps - Standing Hip Extension with Counter Support  - 1 x daily - 3 x weekly - 2 sets - 10 reps - Standing Hip Abduction with Counter Support  - 1 x daily - 3 x weekly - 2 sets - 10 reps - Marching Bridge  - 1 x daily - 3 x weekly -  2 sets - 10 reps - Cat Cow  - 1 x daily - 7 x weekly - 2 sets - 10 reps - Supine Lower Trunk Rotation  - 1 x daily - 7 x weekly - 1 sets - 10 reps - Supine Figure 4 Piriformis Stretch  - 1 x daily - 7 x weekly - 3 sets - 30 sec  hold - Quadruped Full Range Thoracic Rotation with Reach  - 1 x daily - 7 x weekly - 1 sets - 10 reps  ASSESSMENT:  CLINICAL IMPRESSION: Patient with new referral for degeneration of intervertebral disc of lumbar region with  discogenic back pain and lower extremity pain, so re-evaluation was performed today for this new complaint as well as re-assessment of initial lateral hip pain. In regards to her lateral hip pain this continues to steadily improve with patient noted to improvements in hip strength and mild hip pain at the greater trochanter is elicited with hip IR AROM. In regards to her back she reports onset of low back pain a few weeks ago and prior to this in February was devoloping Rt lateral thigh pain. Overall she has good lumbar AROM, though pain is elicited in the low back with majority of ROM with concordant thigh pain elicited with Rt lateral flexion. She will benefit from skilled PT to address ongoing hip weakness and spinal/hip mobility deficits in order to optimize her function and assist in overall pain reduction.   EVAL: Patient is a 68 y.o. female who was seen today for physical therapy evaluation and treatment for acute on  chronic Rt hip pain. She reports worsening of anterolateral hip pain that began about 2 weeks ago when she increased her walking speed during her typical daily walking routine. Upon assessment she is noted to have normal ROM about the hip reporting pain with active abduction and IR. She has significant hip abductor weakness with trendelenburg gait noted on the RLE. She will benefit from skilled PT to address the above stated deficits in order to optimize her function and assist with overall pain reduction.   OBJECTIVE IMPAIRMENTS: Abnormal gait, decreased activity tolerance, decreased balance, decreased knowledge of condition, difficulty walking, decreased strength, improper body mechanics, and pain.   ACTIVITY LIMITATIONS: carrying, lifting, bending, sitting, stairs, transfers, and locomotion level  PARTICIPATION LIMITATIONS: meal prep, cleaning, laundry, shopping, community activity, yard work, and recreation   PERSONAL FACTORS: Age, Time since onset of injury/illness/exacerbation,  and 1 comorbidity: osteoarthritis  are also affecting patient's functional outcome.   REHAB POTENTIAL: Good  CLINICAL DECISION MAKING: Stable/uncomplicated  EVALUATION COMPLEXITY: Low   GOALS: Goals reviewed with patient? Yes  SHORT TERM GOALS: Target date: 09/13/2023  Patient will be independent and compliant with initial HEP.  Baseline: issued at eval.  Goal status: MET  2.  Patient will demonstrate pain free Rt hip AROM to improve ability to complete reaching and bending activity.  Baseline: pain with IR and abduction AROM Goal status: partially met   3.  Patient will be able to maintain SLS on the RLE with no pelvic drop to improve gait stability.  Baseline: Trendelenburg  09/06/23: unable to maintain level pelvis without UE support  09/26/23: negative Trendelenburg  Goal status: MET   LONG TERM GOALS: Target date: 12/16/23  Patient will score at least 64% on FOTO to signify clinically meaningful improvement in functional abilities.  Baseline: see above  Goal status: MET  2.  Patient will demonstrate at least 4/5 Rt hip abductor strength to  improve stability with walking.  Baseline: see above Goal status: REVISED   3.  Patient will report pain at worst rated as </=2/10 to reduce current functional limitations.   Baseline: 5 Goal status: MET  4. Patient will have no onset of lateral thigh pain with lumbar AROM indicative of improvement in current condition.   Baseline: see ROM chart above  Goal status: NEW   5. Patient will be able to tolerate walking at least 1 mile of outdoor walking with minimal/no back/RLE pain.   Baseline: has tolerated treadmill walking, is avoiding hills with initiating outdoor walking  Goal Status: NEW     PLAN:  PT FREQUENCY: 2x/week  PT DURATION: 6 weeks  PLANNED INTERVENTIONS: 97164- PT Re-evaluation, 97110-Therapeutic exercises, 97530- Therapeutic activity, [id number]- Neuromuscular re-education, 97535- Self Care, 13086- Manual therapy,  [id number]- Gait training, [phone]- Aquatic Therapy, [id number]- Electrical stimulation (unattended), [id number]- Electrical stimulation (manual), [id number]- Ionotophoresis 4mg /ml Dexamethasone, Dry Needling, Cryotherapy, and Moist heat  PLAN FOR NEXT SESSION: TPDN response; progress glute med/HS strength as tolerated. Hip strengthening; single leg exercises/activities; lumbar mobility (mindful of osteoporosis)   Letitia Libra, PT, DPT, ATC 11/01/23 12:57 PM  Letitia Libra, PT, DPT, ATC 11/09/23 9:17 AM  Referring diagnosis?  M25.551 (ICD-10-CM) - Right hip pain   M51.362 (ICD-10-CM) - Degeneration of intervertebral disc of lumbar region with discogenic back pain and lower extremity pain  Treatment diagnosis? (if different than referring diagnosis)   Pain in right hip  Other abnormalities of gait and mobility  Muscle weakness (generalized)  Other low back pain  What was this (referring dx) caused by? []  Surgery []  Fall [x]  Ongoing issue []  Arthritis []  Other: ____________  Laterality: [x]  Rt []  Lt []  Both  Check all possible CPT codes:  *CHOOSE 10 OR LESS*    See Planned Interventions listed in the Plan section of the Evaluation.

## 2023-11-08 LAB — GENECONNECT MOLECULAR SCREEN

## 2023-11-09 ENCOUNTER — Ambulatory Visit: Attending: Sports Medicine

## 2023-11-09 ENCOUNTER — Telehealth: Payer: Self-pay | Admitting: Medical Genetics

## 2023-11-09 DIAGNOSIS — M5459 Other low back pain: Secondary | ICD-10-CM | POA: Diagnosis not present

## 2023-11-09 DIAGNOSIS — M25551 Pain in right hip: Secondary | ICD-10-CM | POA: Insufficient documentation

## 2023-11-09 DIAGNOSIS — M25651 Stiffness of right hip, not elsewhere classified: Secondary | ICD-10-CM | POA: Diagnosis not present

## 2023-11-09 DIAGNOSIS — M6281 Muscle weakness (generalized): Secondary | ICD-10-CM | POA: Diagnosis not present

## 2023-11-09 DIAGNOSIS — R2689 Other abnormalities of gait and mobility: Secondary | ICD-10-CM | POA: Diagnosis not present

## 2023-11-09 NOTE — Therapy (Signed)
 OUTPATIENT PHYSICAL THERAPY LOWER EXTREMITY TREATMENT     Patient Name: Becky Bowers MRN: [id number] DOB:1956-06-15, 68 y.o., female Today's Date: 11/09/2023  END OF SESSION:  PT End of Session - 11/09/23 0848     Visit Number 20    Number of Visits 31    Date for PT Re-Evaluation 12/16/23    Authorization Type [employer]    Authorization Time Period 4/3-5/10/25    Authorization - Visit Number 1    Authorization - Number of Visits 12    Progress Note Due on Visit 29    PT Start Time 0849    PT Stop Time 0930    PT Time Calculation (min) 41 min    Activity Tolerance Patient tolerated treatment well    Behavior During Therapy [facility] for tasks assessed/performed                Past Medical History:  Diagnosis Date   Arthritis    Concussion 09/12/2019   Past Surgical History:  Procedure Laterality Date   APPENDECTOMY  1968   DILATION AND CURETTAGE OF UTERUS  1990   TONSILLECTOMY  1980   Patient Active Problem List   Diagnosis Date Noted   Osteoporosis 10/11/2023   Right hip pain 08/17/2023   Greater trochanteric bursitis, right 05/31/2023   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 02/03/2023   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   Elevated LDL cholesterol level 04/14/2021   Polyarthralgia 03/17/2021   Primary osteoarthritis right first [facility] and right second MCP, right trigger thumb 04/24/2018   Numbness of right third and fourth toes 03/19/2018   Primary osteoarthritis of both hips 12/08/2015   Headache 08/12/2010   Lumbar degenerative disc disease 01/07/2008    PCP: Agapito Games, MD   REFERRING PROVIDER: Monica Becton, MD   REFERRING DIAG: [phone] (ICD-10-CM) - Right hip pain   M51.362 (ICD-10-CM) - Degeneration of intervertebral disc of lumbar region with discogenic back pain and lower extremity pain   THERAPY DIAG:  Pain in right hip  Other abnormalities of gait and mobility  Muscle weakness  (generalized)  Other low back pain  Rationale for Evaluation and Treatment: Rehabilitation  ONSET DATE: May 2024 with recent worsening January 2025   SUBJECTIVE:   SUBJECTIVE STATEMENT: "Better than 2 days ago." She was lifting/climbing on a ladder and had pain shooting down the LLE. It is better today. Still is sore in Lt buttock and has Rt lateral thigh pain.   EVAL: Patient reports she tore her hamstring in May 2024 and wasn't diagnosed until June. She had PT for this injury. The hamstring pain is still there, but not as bad. She is now having worsening pain along the side/front of hip that started about 2 weeks ago. She reports that it was swollen. The hip was so painful that she couldn't abduct the hip. She increased her walking speed right around the time that this pain began in the front/side of the hip, so is unsure if this was the cause of her new area of pain. She saw Dr. Karie Schwalbe and had an injection, which seems to have helped with her pain. Rotational movement or transitioning from sit to stand still bothers the hip. Prior to this pain she was walking daily about 3 miles.   PERTINENT HISTORY: Rt hip injection 08/17/23 Osteoporosis  PAIN:  Are you having pain? Yes: NPRS scale: 1/10 Pain location: Rt lateral thigh, Lt buttock Pain description: stabbing, dull ache Aggravating factors:  rotational movement, sitting, transitioning from sitting  Relieving factors: injection,rest  PRECAUTIONS: None  RED FLAGS: None   WEIGHT BEARING RESTRICTIONS: No  FALLS:  Has patient fallen in last 6 months? No  LIVING ENVIRONMENT: Lives with: lives with their spouse Lives in: House/apartment Stairs: Yes: Internal: 13 steps; on left going up Has following equipment at home: None  OCCUPATION: retired   PLOF: Independent  PATIENT GOALS: "I would love to have no pain and resume back to normal; make it more manageable."   NEXT MD VISIT: 10/19/2023  OBJECTIVE:  Note: Objective measures were  completed at Evaluation unless otherwise noted.  DIAGNOSTIC FINDINGS:  Rt Hip MRI: IMPRESSION: 1. Right hamstring tendinosis with a full-thickness minimally retracted tear of the biceps femoris tendon origin with peritendinous edema. 2. Moderate volume fluid within the right ischiogluteal bursa. 3. Mild tendinosis of the contralateral left hamstring origin. 4. Mild tendinosis of the bilateral gluteus medius and minimus tendons without tear. 5. Mild osteoarthritis of the right hip.  PATIENT SURVEYS:  FOTO 56% function; 64% predicted  09/26/23: 73% function   COGNITION: Overall cognitive status: Within functional limits for tasks assessed     SENSATION: Not tested  EDEMA:  Not inspected   MUSCLE LENGTH: Not tested   POSTURE: No Significant postural limitations  PALPATION: TTP Rt greater trochanter, lateral gluteals  11/01/23: gentle PAIVM with patient reporting pain isolated to the low back at L5-S1   LOWER EXTREMITY ROM:  Active ROM Right eval Left eval 09/06/23 Right  09/26/23 Right  11/01/23 Right    Hip flexion Full   Full  Full dull pain anterolateral hip with lowering  Full   Hip extension       Hip abduction Full pain   Full pain anterior hip/thigh Full    Hip adduction       Hip internal rotation 30 pain   28 pain 30 twinge at greater trochanter 30 mild pain at greater trochanter   Hip external rotation 28  30 35 sharp pain lateral thigh  35 lateral thigh pain  Knee flexion       Knee extension       Ankle dorsiflexion       Ankle plantarflexion       Ankle inversion       Ankle eversion        (Blank rows = not tested)  LOWER EXTREMITY MMT:  MMT Right eval Left eval 09/06/23 Right  09/26/23 Right  10/20/23 Right  11/01/23  Hip flexion 4+ 4  4  Rt: 5 lateral thigh pain; Lt: 4+  Hip extension 4 5  4   Lt: 5; Rt: 4  Hip abduction 3- pain anterior hip 5 3+ pain anterolateral hip 3+ pain greater trochanter 4-  Lt: 4+; Rt: 4- mild glute pain  Hip  adduction 5       Hip internal rotation 4+ "twinge" 4  4+ twinge greater trochanter  5 bilateral; mild pain Rt greater trochanter  Hip external rotation 5 5  5  5  bilateral   Knee flexion 5 5  In prone 4 biased to bicep femoris     Knee extension 5 5      Ankle dorsiflexion        Ankle plantarflexion        Ankle inversion        Ankle eversion         (Blank rows = not tested) LUMBAR ROM:   Active  A/PROM  11/01/23  Flexion Finger tips to floor; pain in low back on ascent   Extension WNL low back pain  Right lateral flexion WNL lateral thigh pain  Left lateral flexion WNL low back pain  Right rotation WNL lateral hip pain  Left rotation WNL   (Blank rows = not tested)  SPECIAL TESTS:  11/01/23: (-) SLR   FUNCTIONAL TESTS:  Not tested   GAIT: Distance walked: 20 ft  Assistive device utilized: None Level of assistance: Complete Independence Comments: Trendelenburg Rt [facility] Adult PT Treatment:                                                DATE: 11/09/23 Therapeutic Exercise: Thread the needle x 5   Neuromuscular re-ed: Quadruped leg extension 2 x 10  Standing hip abduction 2 x 10 @ 1 lb Standing hip extension 2 x 10 @ 1 lb  Standing march 2 x 10 @ 1 lb  Pallof press 2 x 10 red band   Self Care: Discussed various modalities for pain control/reduction for upcoming trip TENS Unit Ice/ice bags Supportive shoes Hiking poles K-tape    [facility] Adult PT Treatment:                                                DATE: 11/01/23 Therapeutic Exercise: Cat/cow x 5 Thread the needle x 5 Figure 4 stretch x 30 sec each  LTR x 5 Reviewed and updated HEP Manual Therapy: LAD RLE, no effect on pain  STM Rt gluteals    [facility] Adult PT Treatment:                                                DATE: 10/30/23 Therapeutic Exercise: Prone quad stretch x 1 minute  Manual Therapy: Skilled palpation of trigger points Trigger Point Dry Needling  Subsequent Treatment: Instructions  reviewed, if requested by the patient, prior to subsequent dry needling treatment.   Patient Verbal Consent Given: Yes Education Handout Provided: Previously Provided Muscles Treated: Rt vastus lateralis  Electrical Stimulation Performed: No Treatment Response/Outcome: twitch response elicited    Neuromuscular re-ed: Sidelying hip abduction x 10, x 10 @ 1 lb  Bird dog 2 x 10  Standing hip flexion on airex 2 x 10  Standing hip abduction on airex x 10 each   Self Care: Walking progression to begin outdoor walking     PATIENT EDUCATION:  Education details: add ankle weights to standing hip strengthening as part of HEP  Person educated: Patient Education method: Programmer, multimedia, Education comprehension: verbalized understanding,  HOME EXERCISE PROGRAM: Access Code: [email or web address] URL: [email or web address] Date: 11/01/2023 Prepared by: Letitia Libra  Exercises - Quadruped Alternating Leg Extensions  - 1 x daily - 3 x weekly - 2 sets - 10 reps - Sidelying Hip Abduction  - 1 x daily - 3 x weekly - 2 sets - 10 reps - Modified Single-Leg Deadlift  - 1 x daily - 3 x weekly - 3 sets - 10 reps - Standing Hip Extension with Counter Support  - 1 x daily - 3 x weekly - 2  sets - 10 reps - Standing Hip Abduction with Counter Support  - 1 x daily - 3 x weekly - 2 sets - 10 reps - Marching Bridge  - 1 x daily - 3 x weekly - 2 sets - 10 reps - Cat Cow  - 1 x daily - 7 x weekly - 2 sets - 10 reps - Supine Lower Trunk Rotation  - 1 x daily - 7 x weekly - 1 sets - 10 reps - Supine Figure 4 Piriformis Stretch  - 1 x daily - 7 x weekly - 3 sets - 30 sec  hold - Quadruped Full Range Thoracic Rotation with Reach  - 1 x daily - 7 x weekly - 1 sets - 10 reps  ASSESSMENT:  CLINICAL IMPRESSION: Patient arrives with mild pain. Time spent discussing various modalities for pain control for upcoming trip as she will be doing a lot of standing and walking, which will likely exacerbate her pain.  Focused on progression of lumbopelvic strengthening with good tolerance. She demonstrates good stability with resisted [place] hip strengthening. Challenged with quadruped strengthening requiring tactile cues to maintain trunk alignment.   RE-EVAL: Patient with new referral for degeneration of intervertebral disc of lumbar region with discogenic back pain and lower extremity pain, so re-evaluation was performed today for this new complaint as well as re-assessment of initial lateral hip pain. In regards to her lateral hip pain this continues to steadily improve with patient noted to improvements in hip strength and mild hip pain at the greater trochanter is elicited with hip IR AROM. In regards to her back she reports onset of low back pain a few weeks ago and prior to this in February was devoloping Rt lateral thigh pain. Overall she has good lumbar AROM, though pain is elicited in the low back with majority of ROM with concordant thigh pain elicited with Rt lateral flexion. She will benefit from skilled PT to address ongoing hip weakness and spinal/hip mobility deficits in order to optimize her function and assist in overall pain reduction.   EVAL: Patient is a 68 y.o. female who was seen today for physical therapy evaluation and treatment for acute on  chronic Rt hip pain. She reports worsening of anterolateral hip pain that began about 2 weeks ago when she increased her walking speed during her typical daily walking routine. Upon assessment she is noted to have normal ROM about the hip reporting pain with active abduction and IR. She has significant hip abductor weakness with trendelenburg gait noted on the RLE. She will benefit from skilled PT to address the above stated deficits in order to optimize her function and assist with overall pain reduction.   OBJECTIVE IMPAIRMENTS: Abnormal gait, decreased activity tolerance, decreased balance, decreased knowledge of condition, difficulty walking, decreased  strength, improper body mechanics, and pain.   ACTIVITY LIMITATIONS: carrying, lifting, bending, sitting, stairs, transfers, and locomotion level  PARTICIPATION LIMITATIONS: meal prep, cleaning, laundry, shopping, community activity, yard work, and recreation   PERSONAL FACTORS: Age, Time since onset of injury/illness/exacerbation, and 1 comorbidity: osteoarthritis  are also affecting patient's functional outcome.   REHAB POTENTIAL: Good  CLINICAL DECISION MAKING: Stable/uncomplicated  EVALUATION COMPLEXITY: Low   GOALS: Goals reviewed with patient? Yes  SHORT TERM GOALS: Target date: 09/13/2023  Patient will be independent and compliant with initial HEP.  Baseline: issued at eval.  Goal status: MET  2.  Patient will demonstrate pain free Rt hip AROM to improve ability to complete reaching and  bending activity.  Baseline: pain with IR and abduction AROM Goal status: partially met   3.  Patient will be able to maintain SLS on the RLE with no pelvic drop to improve gait stability.  Baseline: Trendelenburg  09/06/23: unable to maintain level pelvis without UE support  09/26/23: negative Trendelenburg  Goal status: MET   LONG TERM GOALS: Target date: 12/16/23  Patient will score at least 64% on FOTO to signify clinically meaningful improvement in functional abilities.  Baseline: see above  Goal status: MET  2.  Patient will demonstrate at least 4/5 Rt hip abductor strength to improve stability with walking.  Baseline: see above Goal status: REVISED   3.  Patient will report pain at worst rated as </=2/10 to reduce current functional limitations.   Baseline: 5 Goal status: MET  4. Patient will have no onset of lateral thigh pain with lumbar AROM indicative of improvement in current condition.   Baseline: see ROM chart above  Goal status: NEW   5. Patient will be able to tolerate walking at least 1 mile of outdoor walking with minimal/no back/RLE pain.   Baseline: has  tolerated treadmill walking, is avoiding hills with initiating outdoor walking  Goal Status: NEW     PLAN:  PT FREQUENCY: 2x/week  PT DURATION: 6 weeks  PLANNED INTERVENTIONS: 97164- PT Re-evaluation, 97110-Therapeutic exercises, 97530- Therapeutic activity, [id number]- Neuromuscular re-education, 97535- Self Care, [id number]- Manual therapy, [id number]- Gait training, [phone]- Aquatic Therapy, [phone]- Electrical stimulation (unattended), [id number]- Electrical stimulation (manual), [id number]- Ionotophoresis 4mg /ml Dexamethasone, Dry Needling, Cryotherapy, and Moist heat  PLAN FOR NEXT SESSION: TPDN response; progress glute med/HS strength as tolerated. Hip strengthening; single leg exercises/activities; lumbar mobility (mindful of osteoporosis)   Letitia Libra, PT, DPT, ATC 11/09/23 9:32 AM

## 2023-11-10 ENCOUNTER — Other Ambulatory Visit: Payer: Self-pay | Admitting: Medical Genetics

## 2023-11-10 DIAGNOSIS — Z006 Encounter for examination for normal comparison and control in clinical research program: Secondary | ICD-10-CM

## 2023-11-10 NOTE — Progress Notes (Signed)
 Initial result was a TNP. New order requested. Confirmed consent on file.

## 2023-11-10 NOTE — Telephone Encounter (Signed)
 [facility]  11/10/2023  10:22 AM  Confirmed I was speaking with [name] [phone] by using name and DOB. Informed participant the reason for this call is to follow-up on a recent blood sample the participant provided at one of the [facility] lab locations. Informed participant the test was not able to be completed with this sample and apologized for the inconvenience. Participant was requested to provide a new sample at one of our participating labs at no cost so that participant can continue participation and receive test results. Informed participant they do not need to be fasting and if there are other samples that need to be drawn, they can be done at the same visit. Participant has not had a blood transfusion or blood product in the last 30 days. Participant agreed to provide another sample. Participant was provided the [employer] program website to learn why this may have happened. Participant was thanked for their time and continued support of the above study.    Newman Nip, BS [place]  [facility] Clinical Research Specialist II Direct Dial: [phone]  Fax: [phone]

## 2023-11-13 ENCOUNTER — Ambulatory Visit: Payer: Medicare PPO | Admitting: Sports Medicine

## 2023-11-14 ENCOUNTER — Other Ambulatory Visit: Payer: Self-pay

## 2023-11-14 ENCOUNTER — Ambulatory Visit

## 2023-11-14 DIAGNOSIS — M6281 Muscle weakness (generalized): Secondary | ICD-10-CM

## 2023-11-14 DIAGNOSIS — M5459 Other low back pain: Secondary | ICD-10-CM

## 2023-11-14 DIAGNOSIS — R2689 Other abnormalities of gait and mobility: Secondary | ICD-10-CM

## 2023-11-14 DIAGNOSIS — M25551 Pain in right hip: Secondary | ICD-10-CM | POA: Diagnosis not present

## 2023-11-14 DIAGNOSIS — M25651 Stiffness of right hip, not elsewhere classified: Secondary | ICD-10-CM | POA: Diagnosis not present

## 2023-11-14 NOTE — Patient Instructions (Signed)

## 2023-11-14 NOTE — Therapy (Signed)
 OUTPATIENT PHYSICAL THERAPY LOWER EXTREMITY TREATMENT     Patient Name: Becky Bowers MRN: [id number] DOB:03-Dec-1955, 68 y.o., female Today's Date: 11/14/2023  END OF SESSION:  PT End of Session - 11/14/23 1015     Visit Number 21    Number of Visits 31    Date for PT Re-Evaluation 12/16/23    Authorization Type [employer]    Authorization Time Period 4/3-5/10/25    Authorization - Visit Number 2    Authorization - Number of Visits 12    Progress Note Due on Visit 29    PT Start Time 1017    PT Stop Time 1057    PT Time Calculation (min) 40 min    Activity Tolerance Patient tolerated treatment well    Behavior During Therapy [facility] for tasks assessed/performed                 Past Medical History:  Diagnosis Date   Arthritis    Concussion 09/12/2019   Past Surgical History:  Procedure Laterality Date   APPENDECTOMY  1968   DILATION AND CURETTAGE OF UTERUS  1990   TONSILLECTOMY  1980   Patient Active Problem List   Diagnosis Date Noted   Osteoporosis 10/11/2023   Right hip pain 08/17/2023   Greater trochanteric bursitis, right 05/31/2023   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 02/03/2023   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   Elevated LDL cholesterol level 04/14/2021   Polyarthralgia 03/17/2021   Primary osteoarthritis right first [facility] and right second MCP, right trigger thumb 04/24/2018   Numbness of right third and fourth toes 03/19/2018   Primary osteoarthritis of both hips 12/08/2015   Headache 08/12/2010   Lumbar degenerative disc disease 01/07/2008    PCP: Agapito Games, MD   REFERRING PROVIDER: Monica Becton, MD   REFERRING DIAG: M25.551 (ICD-10-CM) - Right hip pain   M51.362 (ICD-10-CM) - Degeneration of intervertebral disc of lumbar region with discogenic back pain and lower extremity pain   THERAPY DIAG:  Pain in right hip  Other abnormalities of gait and mobility  Muscle weakness  (generalized)  Other low back pain  Rationale for Evaluation and Treatment: Rehabilitation  ONSET DATE: May 2024 with recent worsening January 2025   SUBJECTIVE:   SUBJECTIVE STATEMENT: Patient reports it's been a pretty good week. Felt some pain yesterday afternoon/evening in the left side of the low back and Lt buttock.   EVAL: Patient reports she tore her hamstring in May 2024 and wasn't diagnosed until June. She had PT for this injury. The hamstring pain is still there, but not as bad. She is now having worsening pain along the side/front of hip that started about 2 weeks ago. She reports that it was swollen. The hip was so painful that she couldn't abduct the hip. She increased her walking speed right around the time that this pain began in the front/side of the hip, so is unsure if this was the cause of her new area of pain. She saw Dr. Karie Schwalbe and had an injection, which seems to have helped with her pain. Rotational movement or transitioning from sit to stand still bothers the hip. Prior to this pain she was walking daily about 3 miles.   PERTINENT HISTORY: Rt hip injection 08/17/23 Osteoporosis  PAIN:  Are you having pain? Yes: NPRS scale: 2/10 Pain location: Lt low back, Lt buttock to posterior calf  Pain description: tingling, burn Aggravating factors: rotational movement, sitting, transitioning from sitting  Relieving factors: injection,rest  PRECAUTIONS: None  RED FLAGS: None   WEIGHT BEARING RESTRICTIONS: No  FALLS:  Has patient fallen in last 6 months? No  LIVING ENVIRONMENT: Lives with: lives with their spouse Lives in: House/apartment Stairs: Yes: Internal: 13 steps; on left going up Has following equipment at home: None  OCCUPATION: retired   PLOF: Independent  PATIENT GOALS: "I would love to have no pain and resume back to normal; make it more manageable."   NEXT MD VISIT: 10/19/2023  OBJECTIVE:  Note: Objective measures were completed at Evaluation unless  otherwise noted.  DIAGNOSTIC FINDINGS:  Rt Hip MRI: IMPRESSION: 1. Right hamstring tendinosis with a full-thickness minimally retracted tear of the biceps femoris tendon origin with peritendinous edema. 2. Moderate volume fluid within the right ischiogluteal bursa. 3. Mild tendinosis of the contralateral left hamstring origin. 4. Mild tendinosis of the bilateral gluteus medius and minimus tendons without tear. 5. Mild osteoarthritis of the right hip.  PATIENT SURVEYS:  FOTO 56% function; 64% predicted  09/26/23: 73% function   COGNITION: Overall cognitive status: Within functional limits for tasks assessed     SENSATION: Not tested  EDEMA:  Not inspected   MUSCLE LENGTH: Not tested   POSTURE: No Significant postural limitations  PALPATION: TTP Rt greater trochanter, lateral gluteals  11/01/23: gentle PAIVM with patient reporting pain isolated to the low back at L5-S1   LOWER EXTREMITY ROM:  Active ROM Right eval Left eval 09/06/23 Right  09/26/23 Right  11/01/23 Right    Hip flexion Full   Full  Full dull pain anterolateral hip with lowering  Full   Hip extension       Hip abduction Full pain   Full pain anterior hip/thigh Full    Hip adduction       Hip internal rotation 30 pain   28 pain 30 twinge at greater trochanter 30 mild pain at greater trochanter   Hip external rotation 28  30 35 sharp pain lateral thigh  35 lateral thigh pain  Knee flexion       Knee extension       Ankle dorsiflexion       Ankle plantarflexion       Ankle inversion       Ankle eversion        (Blank rows = not tested)  LOWER EXTREMITY MMT:  MMT Right eval Left eval 09/06/23 Right  09/26/23 Right  10/20/23 Right  11/01/23  Hip flexion 4+ 4  4  Rt: 5 lateral thigh pain; Lt: 4+  Hip extension 4 5  4   Lt: 5; Rt: 4  Hip abduction 3- pain anterior hip 5 3+ pain anterolateral hip 3+ pain greater trochanter 4-  Lt: 4+; Rt: 4- mild glute pain  Hip adduction 5       Hip internal  rotation 4+ "twinge" 4  4+ twinge greater trochanter  5 bilateral; mild pain Rt greater trochanter  Hip external rotation 5 5  5  5  bilateral   Knee flexion 5 5  In prone 4 biased to bicep femoris     Knee extension 5 5      Ankle dorsiflexion        Ankle plantarflexion        Ankle inversion        Ankle eversion         (Blank rows = not tested) LUMBAR ROM:   Active  A/PROM  11/01/23  Flexion Finger tips to floor; pain  in low back on ascent   Extension WNL low back pain  Right lateral flexion WNL lateral thigh pain  Left lateral flexion WNL low back pain  Right rotation WNL lateral hip pain  Left rotation WNL   (Blank rows = not tested)  SPECIAL TESTS:  11/01/23: (-) SLR   FUNCTIONAL TESTS:  Not tested   GAIT: Distance walked: 20 ft  Assistive device utilized: None Level of assistance: Complete Independence Comments: Trendelenburg Rt [facility] Adult PT Treatment:                                                DATE: 11/14/23 Therapeutic Exercise: Prone pressup 2 x 10  Standing lumbar extension x 10  Updated HEP   Therapeutic Activity: Seated hip hinge with dowel x 10 Standing hip hinge with dowel x 10   Self Care: Discussed importance of maintaining neutral spine with pilates- avoiding flexion Bending/lifting mechanics- with handout provided     [facility] Adult PT Treatment:                                                DATE: 11/09/23 Therapeutic Exercise: Thread the needle x 5   Neuromuscular re-ed: Quadruped leg extension 2 x 10  Standing hip abduction 2 x 10 @ 1 lb Standing hip extension 2 x 10 @ 1 lb  Standing march 2 x 10 @ 1 lb  Pallof press 2 x 10 red band   Self Care: Discussed various modalities for pain control/reduction for upcoming trip TENS Unit Ice/ice bags Supportive shoes Hiking poles K-tape    [facility] Adult PT Treatment:                                                DATE: 11/01/23 Therapeutic Exercise: Cat/cow x 5 Thread the needle x 5 Figure 4  stretch x 30 sec each  LTR x 5 Reviewed and updated HEP Manual Therapy: LAD RLE, no effect on pain  STM Rt gluteals    [facility] Adult PT Treatment:                                                DATE: 10/30/23 Therapeutic Exercise: Prone quad stretch x 1 minute  Manual Therapy: Skilled palpation of trigger points Trigger Point Dry Needling  Subsequent Treatment: Instructions reviewed, if requested by the patient, prior to subsequent dry needling treatment.   Patient Verbal Consent Given: Yes Education Handout Provided: Previously Provided Muscles Treated: Rt vastus lateralis  Electrical Stimulation Performed: No Treatment Response/Outcome: twitch response elicited    Neuromuscular re-ed: Sidelying hip abduction x 10, x 10 @ 1 lb  Bird dog 2 x 10  Standing hip flexion on airex 2 x 10  Standing hip abduction on airex x 10 each   Self Care: Walking progression to begin outdoor walking     PATIENT EDUCATION:  Education details: HEP update; see treatment Person educated: Patient Education method: Explanation,demo, cues,  handout Education comprehension: verbalized understanding, returned demo  HOME EXERCISE PROGRAM: Access Code: [email or web address] URL: [email or web address] Date: 11/14/2023 Prepared by: Letitia Libra  Exercises - Quadruped Alternating Leg Extensions  - 1 x daily - 3 x weekly - 2 sets - 10 reps - Sidelying Hip Abduction  - 1 x daily - 3 x weekly - 2 sets - 10 reps - Modified Single-Leg Deadlift  - 1 x daily - 3 x weekly - 3 sets - 10 reps - Standing Hip Extension with Counter Support  - 1 x daily - 3 x weekly - 2 sets - 10 reps - Standing Hip Abduction with Counter Support  - 1 x daily - 3 x weekly - 2 sets - 10 reps - Marching Bridge  - 1 x daily - 3 x weekly - 2 sets - 10 reps - Cat Cow  - 1 x daily - 7 x weekly - 2 sets - 10 reps - Supine Lower Trunk Rotation  - 1 x daily - 7 x weekly - 1 sets - 10 reps - Supine Figure 4 Piriformis Stretch  - 1 x  daily - 7 x weekly - 3 sets - 30 sec  hold - Quadruped Full Range Thoracic Rotation with Reach  - 1 x daily - 7 x weekly - 1 sets - 10 reps - Prone Press Up  - 6 x daily - 7 x weekly - 1 sets - 10 reps - Standing Lumbar Extension  - 6 x daily - 7 x weekly - 1 sets - 10 reps  ASSESSMENT:  CLINICAL IMPRESSION: Patient arrives with pain about the Lt low back and radiating into posterior LLE. Given further peripheralization of symptoms we completed trial of repeated movement. She seems to respond positively to prone pressup reporting abolishment of LLE pain with this movement, so extension biased movement was added to HEP. Focused on lifting mechanics with patient requiring heavy cues initially for proper hip hinge with ability to correct with cues and use of dowel. No return of pain with lifting activity today.   RE-EVAL: Patient with new referral for degeneration of intervertebral disc of lumbar region with discogenic back pain and lower extremity pain, so re-evaluation was performed today for this new complaint as well as re-assessment of initial lateral hip pain. In regards to her lateral hip pain this continues to steadily improve with patient noted to improvements in hip strength and mild hip pain at the greater trochanter is elicited with hip IR AROM. In regards to her back she reports onset of low back pain a few weeks ago and prior to this in February was devoloping Rt lateral thigh pain. Overall she has good lumbar AROM, though pain is elicited in the low back with majority of ROM with concordant thigh pain elicited with Rt lateral flexion. She will benefit from skilled PT to address ongoing hip weakness and spinal/hip mobility deficits in order to optimize her function and assist in overall pain reduction.   EVAL: Patient is a 68 y.o. female who was seen today for physical therapy evaluation and treatment for acute on  chronic Rt hip pain. She reports worsening of anterolateral hip pain that began  about 2 weeks ago when she increased her walking speed during her typical daily walking routine. Upon assessment she is noted to have normal ROM about the hip reporting pain with active abduction and IR. She has significant hip abductor weakness with trendelenburg gait noted on the RLE. She will benefit from skilled  PT to address the above stated deficits in order to optimize her function and assist with overall pain reduction.   OBJECTIVE IMPAIRMENTS: Abnormal gait, decreased activity tolerance, decreased balance, decreased knowledge of condition, difficulty walking, decreased strength, improper body mechanics, and pain.   ACTIVITY LIMITATIONS: carrying, lifting, bending, sitting, stairs, transfers, and locomotion level  PARTICIPATION LIMITATIONS: meal prep, cleaning, laundry, shopping, community activity, yard work, and recreation   PERSONAL FACTORS: Age, Time since onset of injury/illness/exacerbation, and 1 comorbidity: osteoarthritis  are also affecting patient's functional outcome.   REHAB POTENTIAL: Good  CLINICAL DECISION MAKING: Stable/uncomplicated  EVALUATION COMPLEXITY: Low   GOALS: Goals reviewed with patient? Yes  SHORT TERM GOALS: Target date: 09/13/2023  Patient will be independent and compliant with initial HEP.  Baseline: issued at eval.  Goal status: MET  2.  Patient will demonstrate pain free Rt hip AROM to improve ability to complete reaching and bending activity.  Baseline: pain with IR and abduction AROM Goal status: partially met   3.  Patient will be able to maintain SLS on the RLE with no pelvic drop to improve gait stability.  Baseline: Trendelenburg  09/06/23: unable to maintain level pelvis without UE support  09/26/23: negative Trendelenburg  Goal status: MET   LONG TERM GOALS: Target date: 12/16/23  Patient will score at least 64% on FOTO to signify clinically meaningful improvement in functional abilities.  Baseline: see above  Goal status:  MET  2.  Patient will demonstrate at least 4/5 Rt hip abductor strength to improve stability with walking.  Baseline: see above Goal status: REVISED   3.  Patient will report pain at worst rated as </=2/10 to reduce current functional limitations.   Baseline: 5 Goal status: MET  4. Patient will have no onset of lateral thigh pain with lumbar AROM indicative of improvement in current condition.   Baseline: see ROM chart above  Goal status: NEW   5. Patient will be able to tolerate walking at least 1 mile of outdoor walking with minimal/no back/RLE pain.   Baseline: has tolerated treadmill walking, is avoiding hills with initiating outdoor walking  Goal Status: NEW     PLAN:  PT FREQUENCY: 2x/week  PT DURATION: 6 weeks  PLANNED INTERVENTIONS: 97164- PT Re-evaluation, 97110-Therapeutic exercises, 97530- Therapeutic activity, [id number]- Neuromuscular re-education, 97535- Self Care, [id number]- Manual therapy, [id number]- Gait training, [phone]- Aquatic Therapy, [phone]- Electrical stimulation (unattended), [id number]- Electrical stimulation (manual), [id number]- Ionotophoresis 4mg /ml Dexamethasone, Dry Needling, Cryotherapy, and Moist heat  PLAN FOR NEXT SESSION: progress glute med/HS strength as tolerated. Hip strengthening; single leg exercises/activities; response to extension? Progress lifting working on hip hinge/squat mechanics. Further discussion of pilates activities with what to limit.   Letitia Libra, PT, DPT, ATC 11/14/23 11:00 AM

## 2023-11-15 ENCOUNTER — Other Ambulatory Visit

## 2023-11-15 ENCOUNTER — Other Ambulatory Visit: Payer: Self-pay | Admitting: Medical Genetics

## 2023-11-15 DIAGNOSIS — Z006 Encounter for examination for normal comparison and control in clinical research program: Secondary | ICD-10-CM

## 2023-11-16 ENCOUNTER — Ambulatory Visit: Admitting: Sports Medicine

## 2023-11-16 ENCOUNTER — Ambulatory Visit

## 2023-11-16 DIAGNOSIS — M25551 Pain in right hip: Secondary | ICD-10-CM

## 2023-11-16 DIAGNOSIS — M6281 Muscle weakness (generalized): Secondary | ICD-10-CM

## 2023-11-16 DIAGNOSIS — M5459 Other low back pain: Secondary | ICD-10-CM

## 2023-11-16 DIAGNOSIS — M25651 Stiffness of right hip, not elsewhere classified: Secondary | ICD-10-CM

## 2023-11-16 DIAGNOSIS — R2689 Other abnormalities of gait and mobility: Secondary | ICD-10-CM | POA: Diagnosis not present

## 2023-11-16 NOTE — Therapy (Signed)
 OUTPATIENT PHYSICAL THERAPY LOWER EXTREMITY TREATMENT     Patient Name: NALIYA GISH MRN: [id number] DOB:January 11, 1956, 68 y.o., female Today's Date: 11/16/2023  END OF SESSION:  PT End of Session - 11/16/23 0846     Visit Number 22    Number of Visits 31    Date for PT Re-Evaluation 12/16/23    Authorization Type [employer]    Authorization Time Period 4/3-5/10/25    Authorization - Visit Number 3    Authorization - Number of Visits 12    Progress Note Due on Visit 29    PT Start Time 0848    PT Stop Time 0930    PT Time Calculation (min) 42 min    Activity Tolerance Patient tolerated treatment well    Behavior During Therapy [facility] for tasks assessed/performed            Past Medical History:  Diagnosis Date   Arthritis    Concussion 09/12/2019   Past Surgical History:  Procedure Laterality Date   APPENDECTOMY  1968   DILATION AND CURETTAGE OF UTERUS  1990   TONSILLECTOMY  1980   Patient Active Problem List   Diagnosis Date Noted   Osteoporosis 10/11/2023   Right hip pain 08/17/2023   Greater trochanteric bursitis, right 05/31/2023   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 02/03/2023   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   Elevated LDL cholesterol level 04/14/2021   Polyarthralgia 03/17/2021   Primary osteoarthritis right first [facility] and right second MCP, right trigger thumb 04/24/2018   Numbness of right third and fourth toes 03/19/2018   Primary osteoarthritis of both hips 12/08/2015   Headache 08/12/2010   Lumbar degenerative disc disease 01/07/2008    PCP: Agapito Games, MD   REFERRING PROVIDER: Monica Becton, MD   REFERRING DIAG: [phone] (ICD-10-CM) - Right hip pain   M51.362 (ICD-10-CM) - Degeneration of intervertebral disc of lumbar region with discogenic back pain and lower extremity pain   THERAPY DIAG:  Pain in right hip  Other abnormalities of gait and mobility  Muscle weakness  (generalized)  Other low back pain  Stiffness of right hip, not elsewhere classified  Rationale for Evaluation and Treatment: Rehabilitation  ONSET DATE: May 2024 with recent worsening January 2025   SUBJECTIVE:   SUBJECTIVE STATEMENT: Patient reports R hamstring is a little sore today from aqua barre class yesterday. Patient states she continues to have pain on L side of low back and a "tightness" along lateral L thigh to knee. Patient states she has been doing the extension stretches throughout the day but has not been feeling much of a difference.  EVAL: Patient reports she tore her hamstring in May 2024 and wasn't diagnosed until June. She had PT for this injury. The hamstring pain is still there, but not as bad. She is now having worsening pain along the side/front of hip that started about 2 weeks ago. She reports that it was swollen. The hip was so painful that she couldn't abduct the hip. She increased her walking speed right around the time that this pain began in the front/side of the hip, so is unsure if this was the cause of her new area of pain. She saw Dr. Karie Schwalbe and had an injection, which seems to have helped with her pain. Rotational movement or transitioning from sit to stand still bothers the hip. Prior to this pain she was walking daily about 3 miles.   PERTINENT HISTORY: Rt hip injection 08/17/23  Osteoporosis  PAIN:  Are you having pain? Yes: NPRS scale: 2/10 Pain location: Lt low back, Lt buttock to posterior calf  Pain description: tingling, burn Aggravating factors: rotational movement, sitting, transitioning from sitting  Relieving factors: injection,rest  PRECAUTIONS: None  RED FLAGS: None   WEIGHT BEARING RESTRICTIONS: No  FALLS:  Has patient fallen in last 6 months? No  LIVING ENVIRONMENT: Lives with: lives with their spouse Lives in: House/apartment Stairs: Yes: Internal: 13 steps; on left going up Has following equipment at home: None  OCCUPATION:  retired   PLOF: Independent  PATIENT GOALS: "I would love to have no pain and resume back to normal; make it more manageable."   NEXT MD VISIT: 10/19/2023  OBJECTIVE:  Note: Objective measures were completed at Evaluation unless otherwise noted.  DIAGNOSTIC FINDINGS:  Rt Hip MRI: IMPRESSION: 1. Right hamstring tendinosis with a full-thickness minimally retracted tear of the biceps femoris tendon origin with peritendinous edema. 2. Moderate volume fluid within the right ischiogluteal bursa. 3. Mild tendinosis of the contralateral left hamstring origin. 4. Mild tendinosis of the bilateral gluteus medius and minimus tendons without tear. 5. Mild osteoarthritis of the right hip.  PATIENT SURVEYS:  FOTO 56% function; 64% predicted  09/26/23: 73% function   COGNITION: Overall cognitive status: Within functional limits for tasks assessed     SENSATION: Not tested  EDEMA:  Not inspected   MUSCLE LENGTH: Not tested   POSTURE: No Significant postural limitations  PALPATION: TTP Rt greater trochanter, lateral gluteals  11/01/23: gentle PAIVM with patient reporting pain isolated to the low back at L5-S1   LOWER EXTREMITY ROM:  Active ROM Right eval Left eval 09/06/23 Right  09/26/23 Right  11/01/23 Right    Hip flexion Full   Full  Full dull pain anterolateral hip with lowering  Full   Hip extension       Hip abduction Full pain   Full pain anterior hip/thigh Full    Hip adduction       Hip internal rotation 30 pain   28 pain 30 twinge at greater trochanter 30 mild pain at greater trochanter   Hip external rotation 28  30 35 sharp pain lateral thigh  35 lateral thigh pain  Knee flexion       Knee extension       Ankle dorsiflexion       Ankle plantarflexion       Ankle inversion       Ankle eversion        (Blank rows = not tested)  LOWER EXTREMITY MMT:  MMT Right eval Left eval 09/06/23 Right  09/26/23 Right  10/20/23 Right  11/01/23  Hip flexion 4+ 4  4  Rt: 5  lateral thigh pain; Lt: 4+  Hip extension 4 5  4   Lt: 5; Rt: 4  Hip abduction 3- pain anterior hip 5 3+ pain anterolateral hip 3+ pain greater trochanter 4-  Lt: 4+; Rt: 4- mild glute pain  Hip adduction 5       Hip internal rotation 4+ "twinge" 4  4+ twinge greater trochanter  5 bilateral; mild pain Rt greater trochanter  Hip external rotation 5 5  5  5  bilateral   Knee flexion 5 5  In prone 4 biased to bicep femoris     Knee extension 5 5      Ankle dorsiflexion        Ankle plantarflexion        Ankle inversion  Ankle eversion         (Blank rows = not tested) LUMBAR ROM:   Active  A/PROM  11/01/23  Flexion Finger tips to floor; pain in low back on ascent   Extension WNL low back pain  Right lateral flexion WNL lateral thigh pain  Left lateral flexion WNL low back pain  Right rotation WNL lateral hip pain  Left rotation WNL   (Blank rows = not tested)  SPECIAL TESTS:  11/01/23: (-) SLR   FUNCTIONAL TESTS:  Not tested   GAIT: Distance walked: 20 ft  Assistive device utilized: None Level of assistance: Complete Independence Comments: Trendelenburg Rt  [facility] Adult PT Treatment:                                                DATE: 11/16/2023 Therapeutic Exercise: Prone press up 2x10 Supine: Double leg stretch + orange ball b/w knees Double leg stretch + black band around ankles --> around knees  Neuromuscular re-ed: Standing hip hinge deadlift --> added small knee bend Modified single leg deadlift Staggered stance lunge (back heel lifted) + straight arm press down with RTB for ab bracing x10 each leg Blue TB boxing --> double arm --> alt single arm --> small circles Kneeling chest expansion + GTB    [facility] Adult PT Treatment:                                                DATE: 11/14/23 Therapeutic Exercise: Prone pressup 2 x 10  Standing lumbar extension x 10  Updated HEP   Therapeutic Activity: Seated hip hinge with dowel x 10 Standing hip hinge with dowel  x 10   Self Care: Discussed importance of maintaining neutral spine with pilates- avoiding flexion Bending/lifting mechanics- with handout provided     [facility] Adult PT Treatment:                                                DATE: 11/09/23 Therapeutic Exercise: Thread the needle x 5   Neuromuscular re-ed: Quadruped leg extension 2 x 10  Standing hip abduction 2 x 10 @ 1 lb Standing hip extension 2 x 10 @ 1 lb  Standing march 2 x 10 @ 1 lb  Pallof press 2 x 10 red band   Self Care: Discussed various modalities for pain control/reduction for upcoming trip TENS Unit Ice/ice bags Supportive shoes Hiking poles K-tape     [facility] Adult PT Treatment:                                                DATE: 10/30/23 Therapeutic Exercise: Prone quad stretch x 1 minute  Manual Therapy: Skilled palpation of trigger points Trigger Point Dry Needling  Subsequent Treatment: Instructions reviewed, if requested by the patient, prior to subsequent dry needling treatment.   Patient Verbal Consent Given: Yes Education Handout Provided: Previously Provided Muscles Treated: Rt vastus lateralis  Electrical Stimulation Performed:  No Treatment Response/Outcome: twitch response elicited    Neuromuscular re-ed: Sidelying hip abduction x 10, x 10 @ 1 lb  Bird dog 2 x 10  Standing hip flexion on airex 2 x 10  Standing hip abduction on airex x 10 each   Self Care: Walking progression to begin outdoor walking     PATIENT EDUCATION:  Education details: HEP update; see treatment Person educated: Patient Education method: Explanation,demo, cues, handout Education comprehension: verbalized understanding, returned demo  HOME EXERCISE PROGRAM: Access Code: [email or web address] URL: [email or web address] Date: 11/14/2023 Prepared by: Letitia Libra  Exercises - Quadruped Alternating Leg Extensions  - 1 x daily - 3 x weekly - 2 sets - 10 reps - Sidelying Hip Abduction  - 1 x daily - 3 x weekly  - 2 sets - 10 reps - Modified Single-Leg Deadlift  - 1 x daily - 3 x weekly - 3 sets - 10 reps - Standing Hip Extension with Counter Support  - 1 x daily - 3 x weekly - 2 sets - 10 reps - Standing Hip Abduction with Counter Support  - 1 x daily - 3 x weekly - 2 sets - 10 reps - Marching Bridge  - 1 x daily - 3 x weekly - 2 sets - 10 reps - Cat Cow  - 1 x daily - 7 x weekly - 2 sets - 10 reps - Supine Lower Trunk Rotation  - 1 x daily - 7 x weekly - 1 sets - 10 reps - Supine Figure 4 Piriformis Stretch  - 1 x daily - 7 x weekly - 3 sets - 30 sec  hold - Quadruped Full Range Thoracic Rotation with Reach  - 1 x daily - 7 x weekly - 1 sets - 10 reps - Prone Press Up  - 6 x daily - 7 x weekly - 1 sets - 10 reps - Standing Lumbar Extension  - 6 x daily - 7 x weekly - 1 sets - 10 reps  ASSESSMENT:  CLINICAL IMPRESSION: Hip hinge variations continued with good glute activation demonstrated. Abdominal bracing and postural stabilization challenged with resistance band exercises in standing and kneeling. Tactile cues provided as needed to address rib flare and TA activation.  RE-EVAL: Patient with new referral for degeneration of intervertebral disc of lumbar region with discogenic back pain and lower extremity pain, so re-evaluation was performed today for this new complaint as well as re-assessment of initial lateral hip pain. In regards to her lateral hip pain this continues to steadily improve with patient noted to improvements in hip strength and mild hip pain at the greater trochanter is elicited with hip IR AROM. In regards to her back she reports onset of low back pain a few weeks ago and prior to this in February was devoloping Rt lateral thigh pain. Overall she has good lumbar AROM, though pain is elicited in the low back with majority of ROM with concordant thigh pain elicited with Rt lateral flexion. She will benefit from skilled PT to address ongoing hip weakness and spinal/hip mobility deficits  in order to optimize her function and assist in overall pain reduction.   EVAL: Patient is a 68 y.o. female who was seen today for physical therapy evaluation and treatment for acute on  chronic Rt hip pain. She reports worsening of anterolateral hip pain that began about 2 weeks ago when she increased her walking speed during her typical daily walking routine. Upon assessment she is noted to have normal ROM  about the hip reporting pain with active abduction and IR. She has significant hip abductor weakness with trendelenburg gait noted on the RLE. She will benefit from skilled PT to address the above stated deficits in order to optimize her function and assist with overall pain reduction.   OBJECTIVE IMPAIRMENTS: Abnormal gait, decreased activity tolerance, decreased balance, decreased knowledge of condition, difficulty walking, decreased strength, improper body mechanics, and pain.   ACTIVITY LIMITATIONS: carrying, lifting, bending, sitting, stairs, transfers, and locomotion level  PARTICIPATION LIMITATIONS: meal prep, cleaning, laundry, shopping, community activity, yard work, and recreation   PERSONAL FACTORS: Age, Time since onset of injury/illness/exacerbation, and 1 comorbidity: osteoarthritis  are also affecting patient's functional outcome.   REHAB POTENTIAL: Good  CLINICAL DECISION MAKING: Stable/uncomplicated  EVALUATION COMPLEXITY: Low   GOALS: Goals reviewed with patient? Yes  SHORT TERM GOALS: Target date: 09/13/2023  Patient will be independent and compliant with initial HEP.  Baseline: issued at eval.  Goal status: MET  2.  Patient will demonstrate pain free Rt hip AROM to improve ability to complete reaching and bending activity.  Baseline: pain with IR and abduction AROM Goal status: partially met   3.  Patient will be able to maintain SLS on the RLE with no pelvic drop to improve gait stability.  Baseline: Trendelenburg  09/06/23: unable to maintain level pelvis  without UE support  09/26/23: negative Trendelenburg  Goal status: MET   LONG TERM GOALS: Target date: 12/16/23  Patient will score at least 64% on FOTO to signify clinically meaningful improvement in functional abilities.  Baseline: see above  Goal status: MET  2.  Patient will demonstrate at least 4/5 Rt hip abductor strength to improve stability with walking.  Baseline: see above Goal status: REVISED   3.  Patient will report pain at worst rated as </=2/10 to reduce current functional limitations.   Baseline: 5 Goal status: MET  4. Patient will have no onset of lateral thigh pain with lumbar AROM indicative of improvement in current condition.   Baseline: see ROM chart above  Goal status: NEW   5. Patient will be able to tolerate walking at least 1 mile of outdoor walking with minimal/no back/RLE pain.   Baseline: has tolerated treadmill walking, is avoiding hills with initiating outdoor walking  Goal Status: NEW     PLAN:  PT FREQUENCY: 2x/week  PT DURATION: 6 weeks  PLANNED INTERVENTIONS: 97164- PT Re-evaluation, 97110-Therapeutic exercises, 97530- Therapeutic activity, [id number]- Neuromuscular re-education, 97535- Self Care, [id number]- Manual therapy, [id number]- Gait training, [phone]- Aquatic Therapy, [phone]- Electrical stimulation (unattended), [id number]- Electrical stimulation (manual), [id number]- Ionotophoresis 4mg /ml Dexamethasone, Dry Needling, Cryotherapy, and Moist heat  PLAN FOR NEXT SESSION: progress glute med/HS strength as tolerated. Hip strengthening; single leg exercises/activities; Progress lifting working on hip hinge/squat mechanics.    Carlynn Herald, PTA 11/16/23 9:31 AM

## 2023-11-20 ENCOUNTER — Other Ambulatory Visit: Payer: Self-pay | Admitting: *Deleted

## 2023-11-20 ENCOUNTER — Other Ambulatory Visit: Payer: Self-pay

## 2023-11-20 DIAGNOSIS — M51362 Other intervertebral disc degeneration, lumbar region with discogenic back pain and lower extremity pain: Secondary | ICD-10-CM

## 2023-11-20 DIAGNOSIS — M19011 Primary osteoarthritis, right shoulder: Secondary | ICD-10-CM

## 2023-11-20 MED ORDER — GABAPENTIN 300 MG PO CAPS: ORAL_CAPSULE | ORAL | 3 refills | Status: DC

## 2023-11-21 ENCOUNTER — Ambulatory Visit (INDEPENDENT_AMBULATORY_CARE_PROVIDER_SITE_OTHER): Admitting: Sports Medicine

## 2023-11-21 ENCOUNTER — Encounter: Payer: Self-pay | Admitting: Sports Medicine

## 2023-11-21 ENCOUNTER — Ambulatory Visit

## 2023-11-21 DIAGNOSIS — S76311A Strain of muscle, fascia and tendon of the posterior muscle group at thigh level, right thigh, initial encounter: Secondary | ICD-10-CM

## 2023-11-21 DIAGNOSIS — M25651 Stiffness of right hip, not elsewhere classified: Secondary | ICD-10-CM

## 2023-11-21 DIAGNOSIS — M6281 Muscle weakness (generalized): Secondary | ICD-10-CM | POA: Diagnosis not present

## 2023-11-21 DIAGNOSIS — M25551 Pain in right hip: Secondary | ICD-10-CM | POA: Diagnosis not present

## 2023-11-21 DIAGNOSIS — M5459 Other low back pain: Secondary | ICD-10-CM

## 2023-11-21 DIAGNOSIS — M51362 Other intervertebral disc degeneration, lumbar region with discogenic back pain and lower extremity pain: Secondary | ICD-10-CM

## 2023-11-21 DIAGNOSIS — R2689 Other abnormalities of gait and mobility: Secondary | ICD-10-CM | POA: Diagnosis not present

## 2023-11-21 LAB — GENECONNECT MOLECULAR SCREEN: Genetic Analysis Overall Interpretation: NEGATIVE

## 2023-11-21 NOTE — Assessment & Plan Note (Signed)
 This very pleasant 68 year old female returns, she has had a long history of buttock and lateral thigh pain, she also has some bilateral low back pain. Worse with twisting, we have been treating her extensively for ischiogluteal bursitis and proximal hamstring tendinosis. We have just focus to look more at her lumbar spine We did an MRI that did show fairly severe spinal stenosis at L4-L5. We added gabapentin which also seems to have helped her symptoms significantly. She is not hurting enough to need an injection today and she is about to go on her trip to [place]. She is currently taking gabapentin 1 capsule 3 times daily, she can increase gradually to 2 capsules 3 times daily. Return to see me a month or 6 weeks after she returns from her trip.

## 2023-11-21 NOTE — Therapy (Signed)
 OUTPATIENT PHYSICAL THERAPY LOWER EXTREMITY TREATMENT     Patient Name: Becky Bowers MRN: [id number] DOB:[birth year], 68 y.o., female Today's Date: 11/21/2023  END OF SESSION:  PT End of Session - 11/21/23 0848     Visit Number 23    Number of Visits 31    Date for PT Re-Evaluation 12/16/23    Authorization Type [employer]    Authorization Time Period 4/3-5/10/25    Authorization - Visit Number 4    Authorization - Number of Visits 12    Progress Note Due on Visit 29    PT Start Time 0848    PT Stop Time 0930    PT Time Calculation (min) 42 min    Activity Tolerance Patient tolerated treatment well    Behavior During Therapy [facility] for tasks assessed/performed            Past Medical History:  Diagnosis Date   Arthritis    Concussion 09/12/2019   Past Surgical History:  Procedure Laterality Date   APPENDECTOMY  1968   DILATION AND CURETTAGE OF UTERUS  1990   TONSILLECTOMY  1980   Patient Active Problem List   Diagnosis Date Noted   Osteoporosis 10/11/2023   Right hip pain 08/17/2023   Greater trochanteric bursitis, right 05/31/2023   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 02/03/2023   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   Elevated LDL cholesterol level 04/14/2021   Polyarthralgia 03/17/2021   Primary osteoarthritis right first [facility] and right second MCP, right trigger thumb 04/24/2018   Numbness of right third and fourth toes 03/19/2018   Primary osteoarthritis of both hips 12/08/2015   Headache 08/12/2010   Lumbar degenerative disc disease 01/07/2008    PCP: Cydney Draft, MD   REFERRING PROVIDER: Gean Keels, MD   REFERRING DIAG: [phone] (ICD-10-CM) - Right hip pain   M51.362 (ICD-10-CM) - Degeneration of intervertebral disc of lumbar region with discogenic back pain and lower extremity pain   THERAPY DIAG:  Pain in right hip  Other abnormalities of gait and mobility  Muscle weakness  (generalized)  Other low back pain  Stiffness of right hip, not elsewhere classified  Rationale for Evaluation and Treatment: Rehabilitation  ONSET DATE: May 2024 with recent worsening January 2025   SUBJECTIVE:   SUBJECTIVE STATEMENT: Patient reports she did a lot of standing and sitting on Saturday and by the end of the day she had pain on the L side of low back; states she felt better after doing stretches on Sunday. Patient states she has appt with Dr. Elva Hamburger today for injection in hamstring.  EVAL: Patient reports she tore her hamstring in May 2024 and wasn't diagnosed until June. She had PT for this injury. The hamstring pain is still there, but not as bad. She is now having worsening pain along the side/front of hip that started about 2 weeks ago. She reports that it was swollen. The hip was so painful that she couldn't abduct the hip. She increased her walking speed right around the time that this pain began in the front/side of the hip, so is unsure if this was the cause of her new area of pain. She saw Dr. Elva Hamburger and had an injection, which seems to have helped with her pain. Rotational movement or transitioning from sit to stand still bothers the hip. Prior to this pain she was walking daily about 3 miles.   PERTINENT HISTORY: Rt hip injection 08/17/23 Osteoporosis  PAIN:  Are you  having pain? Yes: NPRS scale: 2/10 Pain location: Lt low back, Lt buttock to posterior calf  Pain description: tingling, burn Aggravating factors: rotational movement, sitting, transitioning from sitting  Relieving factors: injection,rest  PRECAUTIONS: None  RED FLAGS: None   WEIGHT BEARING RESTRICTIONS: No  FALLS:  Has patient fallen in last 6 months? No  LIVING ENVIRONMENT: Lives with: lives with their spouse Lives in: House/apartment Stairs: Yes: Internal: 13 steps; on left going up Has following equipment at home: None  OCCUPATION: retired   PLOF: Independent  PATIENT GOALS: "I would love  to have no pain and resume back to normal; make it more manageable."   NEXT MD VISIT: 11/21/2023  OBJECTIVE:  Note: Objective measures were completed at Evaluation unless otherwise noted.  DIAGNOSTIC FINDINGS:  Rt Hip MRI: IMPRESSION: 1. Right hamstring tendinosis with a full-thickness minimally retracted tear of the biceps femoris tendon origin with peritendinous edema. 2. Moderate volume fluid within the right ischiogluteal bursa. 3. Mild tendinosis of the contralateral left hamstring origin. 4. Mild tendinosis of the bilateral gluteus medius and minimus tendons without tear. 5. Mild osteoarthritis of the right hip.  PATIENT SURVEYS:  FOTO 56% function; 64% predicted  09/26/23: 73% function   COGNITION: Overall cognitive status: Within functional limits for tasks assessed     SENSATION: Not tested  EDEMA:  Not inspected   MUSCLE LENGTH: Not tested   POSTURE: No Significant postural limitations  PALPATION: TTP Rt greater trochanter, lateral gluteals  11/01/23: gentle PAIVM with patient reporting pain isolated to the low back at L5-S1   LOWER EXTREMITY ROM:  Active ROM Right eval Left eval 09/06/23 Right  09/26/23 Right  11/01/23 Right    Hip flexion Full   Full  Full dull pain anterolateral hip with lowering  Full   Hip extension       Hip abduction Full pain   Full pain anterior hip/thigh Full    Hip adduction       Hip internal rotation 30 pain   28 pain 30 twinge at greater trochanter 30 mild pain at greater trochanter   Hip external rotation 28  30 35 sharp pain lateral thigh  35 lateral thigh pain  Knee flexion       Knee extension       Ankle dorsiflexion       Ankle plantarflexion       Ankle inversion       Ankle eversion        (Blank rows = not tested)  LOWER EXTREMITY MMT:  MMT Right eval Left eval 09/06/23 Right  09/26/23 Right  10/20/23 Right  11/01/23  Hip flexion 4+ 4  4  Rt: 5 lateral thigh pain; Lt: 4+  Hip extension 4 5  4   Lt: 5; Rt:  4  Hip abduction 3- pain anterior hip 5 3+ pain anterolateral hip 3+ pain greater trochanter 4-  Lt: 4+; Rt: 4- mild glute pain  Hip adduction 5       Hip internal rotation 4+ "twinge" 4  4+ twinge greater trochanter  5 bilateral; mild pain Rt greater trochanter  Hip external rotation 5 5  5  5  bilateral   Knee flexion 5 5  In prone 4 biased to bicep femoris     Knee extension 5 5      Ankle dorsiflexion        Ankle plantarflexion        Ankle inversion        Ankle  eversion         (Blank rows = not tested) LUMBAR ROM:   Active  A/PROM  11/01/23  Flexion Finger tips to floor; pain in low back on ascent   Extension WNL low back pain  Right lateral flexion WNL lateral thigh pain  Left lateral flexion WNL low back pain  Right rotation WNL lateral hip pain  Left rotation WNL   (Blank rows = not tested)  SPECIAL TESTS:  11/01/23: (-) SLR   FUNCTIONAL TESTS:  Not tested   GAIT: Distance walked: 20 ft  Assistive device utilized: None Level of assistance: Complete Independence Comments: Trendelenburg Rt  [facility] Adult PT Treatment:                                                DATE: 11/21/2023 Therapeutic Exercise: Treadmill: 1.8-2.5 mph, 0% - 4% incline x 5 min Prone press up x10 Prone on elbows  3x10" Neuromuscular re-ed: Modified plank from quadruped --> rock forward + orange ball b/w thighs  Plank progression + orange ball b/w thighs --> knee taps Wall squat + orange b/w thighs Glute med press with orange ball in wall (B) R SLS toe tap on colored dots --> single leg squat variations   [facility] Adult PT Treatment:                                                DATE: 11/16/2023 Therapeutic Exercise: Prone press up 2x10 Supine: Double leg stretch + orange ball b/w knees Double leg stretch + black band around ankles --> around knees  Neuromuscular re-ed: Standing hip hinge deadlift --> added small knee bend Modified single leg deadlift Staggered stance lunge (back heel  lifted) + straight arm press down with RTB for ab bracing x10 each leg Blue TB boxing --> double arm --> alt single arm --> small circles Kneeling chest expansion + GTB   [facility] Adult PT Treatment:                                                DATE: 10/30/23 Therapeutic Exercise: Prone quad stretch x 1 minute  Manual Therapy: Skilled palpation of trigger points Trigger Point Dry Needling  Subsequent Treatment: Instructions reviewed, if requested by the patient, prior to subsequent dry needling treatment.   Patient Verbal Consent Given: Yes Education Handout Provided: Previously Provided Muscles Treated: Rt vastus lateralis  Electrical Stimulation Performed: No Treatment Response/Outcome: twitch response elicited    Neuromuscular re-ed: Sidelying hip abduction x 10, x 10 @ 1 lb  Bird dog 2 x 10  Standing hip flexion on airex 2 x 10  Standing hip abduction on airex x 10 each   Self Care: Walking progression to begin outdoor walking     PATIENT EDUCATION:  Education details: HEP update; see treatment Person educated: Patient Education method: Explanation,demo, cues, handout Education comprehension: verbalized understanding, returned demo  HOME EXERCISE PROGRAM: Access Code: [email or web address] URL: [email or web address] Date: 11/21/2023 Prepared by: Carlynn Herald  Exercises - Quadruped Alternating Leg Extensions  - 1 x daily - 3 x weekly - 2 sets -  10 reps - Sidelying Hip Abduction  - 1 x daily - 3 x weekly - 2 sets - 10 reps - Modified Single-Leg Deadlift  - 1 x daily - 3 x weekly - 3 sets - 10 reps - Standing Hip Extension with Counter Support  - 1 x daily - 3 x weekly - 2 sets - 10 reps - Standing Hip Abduction with Counter Support  - 1 x daily - 3 x weekly - 2 sets - 10 reps - Marching Bridge  - 1 x daily - 3 x weekly - 2 sets - 10 reps - Cat Cow  - 1 x daily - 7 x weekly - 2 sets - 10 reps - Supine Lower Trunk Rotation  - 1 x daily - 7 x weekly - 1 sets - 10  reps - Supine Figure 4 Piriformis Stretch  - 1 x daily - 7 x weekly - 3 sets - 30 sec  hold - Quadruped Full Range Thoracic Rotation with Reach  - 1 x daily - 7 x weekly - 1 sets - 10 reps - Prone Press Up  - 6 x daily - 7 x weekly - 1 sets - 10 reps - Standing Lumbar Extension  - 6 x daily - 7 x weekly - 1 sets - 10 reps - Kneeling Plank with Feet on Ground  - 1 x daily - 7 x weekly - 3 sets - 10 reps  ASSESSMENT:  CLINICAL IMPRESSION: Modified plank progressed from quadruped to full exercise; ball placed between thighs promoted good hip add and TA activation and increased overall postural stability. Glute med strengthening continued with minimal discomfort. Incline training incorporated in preparation of upcoming trip.  RE-EVAL: Patient with new referral for degeneration of intervertebral disc of lumbar region with discogenic back pain and lower extremity pain, so re-evaluation was performed today for this new complaint as well as re-assessment of initial lateral hip pain. In regards to her lateral hip pain this continues to steadily improve with patient noted to improvements in hip strength and mild hip pain at the greater trochanter is elicited with hip IR AROM. In regards to her back she reports onset of low back pain a few weeks ago and prior to this in February was devoloping Rt lateral thigh pain. Overall she has good lumbar AROM, though pain is elicited in the low back with majority of ROM with concordant thigh pain elicited with Rt lateral flexion. She will benefit from skilled PT to address ongoing hip weakness and spinal/hip mobility deficits in order to optimize her function and assist in overall pain reduction.   EVAL: Patient is a 68 y.o. female who was seen today for physical therapy evaluation and treatment for acute on  chronic Rt hip pain. She reports worsening of anterolateral hip pain that began about 2 weeks ago when she increased her walking speed during her typical daily walking  routine. Upon assessment she is noted to have normal ROM about the hip reporting pain with active abduction and IR. She has significant hip abductor weakness with trendelenburg gait noted on the RLE. She will benefit from skilled PT to address the above stated deficits in order to optimize her function and assist with overall pain reduction.   OBJECTIVE IMPAIRMENTS: Abnormal gait, decreased activity tolerance, decreased balance, decreased knowledge of condition, difficulty walking, decreased strength, improper body mechanics, and pain.   ACTIVITY LIMITATIONS: carrying, lifting, bending, sitting, stairs, transfers, and locomotion level  PARTICIPATION LIMITATIONS: meal prep, cleaning, laundry,  shopping, community activity, yard work, and recreation   PERSONAL FACTORS: Age, Time since onset of injury/illness/exacerbation, and 1 comorbidity: osteoarthritis  are also affecting patient's functional outcome.   REHAB POTENTIAL: Good  CLINICAL DECISION MAKING: Stable/uncomplicated  EVALUATION COMPLEXITY: Low   GOALS: Goals reviewed with patient? Yes  SHORT TERM GOALS: Target date: 09/13/2023  Patient will be independent and compliant with initial HEP.  Baseline: issued at eval.  Goal status: MET  2.  Patient will demonstrate pain free Rt hip AROM to improve ability to complete reaching and bending activity.  Baseline: pain with IR and abduction AROM Goal status: partially met   3.  Patient will be able to maintain SLS on the RLE with no pelvic drop to improve gait stability.  Baseline: Trendelenburg  09/06/23: unable to maintain level pelvis without UE support  09/26/23: negative Trendelenburg  Goal status: MET   LONG TERM GOALS: Target date: 12/16/23  Patient will score at least 64% on FOTO to signify clinically meaningful improvement in functional abilities.  Baseline: see above  Goal status: MET  2.  Patient will demonstrate at least 4/5 Rt hip abductor strength to improve stability  with walking.  Baseline: see above Goal status: REVISED   3.  Patient will report pain at worst rated as </=2/10 to reduce current functional limitations.   Baseline: 5 Goal status: MET  4. Patient will have no onset of lateral thigh pain with lumbar AROM indicative of improvement in current condition.   Baseline: see ROM chart above  Goal status: NEW   5. Patient will be able to tolerate walking at least 1 mile of outdoor walking with minimal/no back/RLE pain.   Baseline: has tolerated treadmill walking, is avoiding hills with initiating outdoor walking  Goal Status: NEW     PLAN:  PT FREQUENCY: 2x/week  PT DURATION: 6 weeks  PLANNED INTERVENTIONS: 97164- PT Re-evaluation, 97110-Therapeutic exercises, 97530- Therapeutic activity, [id number]- Neuromuscular re-education, 97535- Self Care, [id number]- Manual therapy, [id number]- Gait training, [phone]- Aquatic Therapy, [phone]- Electrical stimulation (unattended), [id number]- Electrical stimulation (manual), [id number]- Ionotophoresis 4mg /ml Dexamethasone, Dry Needling, Cryotherapy, and Moist heat  PLAN FOR NEXT SESSION: progress glute med/HS strength as tolerated. Hip strengthening; single leg exercises/activities; Progress lifting working on hip hinge/squat mechanics. MRI results?   Sims Duck, PTA 11/21/23 9:34 AM

## 2023-11-21 NOTE — Progress Notes (Signed)
    Procedures performed today:    None.  Independent interpretation of notes and tests performed by another provider:   None.  Brief History, Exam, Impression, and Recommendations:    Lumbar degenerative disc disease This very pleasant 68 year old female returns, she has had a long history of buttock and lateral thigh pain, she also has some bilateral low back pain. Worse with twisting, we have been treating her extensively for ischiogluteal bursitis and proximal hamstring tendinosis. We have just focus to look more at her lumbar spine We did an MRI that did show fairly severe spinal stenosis at L4-L5. We added gabapentin which also seems to have helped her symptoms significantly. She is not hurting enough to need an injection today and she is about to go on her trip to [place]. She is currently taking gabapentin 1 capsule 3 times daily, she can increase gradually to 2 capsules 3 times daily. Return to see me a month or 6 weeks after she returns from her trip.  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 Annalie does have an MRI confirming a right hamstring tear with ischiogluteal bursitis, she did have some pain of the ischial bursa when sitting. She has failed a lot of the conservative treatments thus far. We considered an ischiogluteal bursa injection today with ultrasound guidance, however as we have shifted focus to her lumbar spine the gabapentin seems to have helped her symptoms significantly, she will continue the gabapentin up taper and she can return to see us  as needed for the ischiogluteal bursitis.    ____________________________________________ Joselyn Nicely. Sandy Crumb, M.D., ABFM., CAQSM., AME. Primary Care and Sports Medicine [facility]  Adjunct Professor of [employer] [facility]  [employer]  Restaurant manager, fast food

## 2023-11-21 NOTE — Assessment & Plan Note (Signed)
 Becky Bowers does have an MRI confirming a right hamstring tear with ischiogluteal bursitis, she did have some pain of the ischial bursa when sitting. She has failed a lot of the conservative treatments thus far. We considered an ischiogluteal bursa injection today with ultrasound guidance, however as we have shifted focus to her lumbar spine the gabapentin seems to have helped her symptoms significantly, she will continue the gabapentin up taper and she can return to see us  as needed for the ischiogluteal bursitis.

## 2023-11-23 ENCOUNTER — Other Ambulatory Visit: Payer: Self-pay

## 2023-11-23 ENCOUNTER — Ambulatory Visit

## 2023-11-23 DIAGNOSIS — R2689 Other abnormalities of gait and mobility: Secondary | ICD-10-CM

## 2023-11-23 DIAGNOSIS — M51362 Other intervertebral disc degeneration, lumbar region with discogenic back pain and lower extremity pain: Secondary | ICD-10-CM

## 2023-11-23 DIAGNOSIS — M5459 Other low back pain: Secondary | ICD-10-CM | POA: Diagnosis not present

## 2023-11-23 DIAGNOSIS — M6281 Muscle weakness (generalized): Secondary | ICD-10-CM | POA: Diagnosis not present

## 2023-11-23 DIAGNOSIS — M25551 Pain in right hip: Secondary | ICD-10-CM | POA: Diagnosis not present

## 2023-11-23 DIAGNOSIS — M25651 Stiffness of right hip, not elsewhere classified: Secondary | ICD-10-CM | POA: Diagnosis not present

## 2023-11-23 DIAGNOSIS — M19011 Primary osteoarthritis, right shoulder: Secondary | ICD-10-CM

## 2023-11-23 MED ORDER — CELECOXIB 200 MG PO CAPS: ORAL_CAPSULE | ORAL | 2 refills | Status: DC

## 2023-11-23 MED ORDER — GABAPENTIN 300 MG PO CAPS: ORAL_CAPSULE | ORAL | 3 refills | Status: DC

## 2023-11-23 NOTE — Telephone Encounter (Signed)
 Already Sent gabapentin refill to [facility] as requested Patient also requesting Celebrex 200mg  to [facility]  Last written 08/16/2023 Last OV 11/21/2023 Upcoming appt 12/27/2023

## 2023-11-23 NOTE — Therapy (Signed)
 OUTPATIENT PHYSICAL THERAPY LOWER EXTREMITY TREATMENT     Patient Name: Becky Bowers MRN: [id number] DOB:09/06/1955, 68 y.o., female Today's Date: 11/23/2023  END OF SESSION:  PT End of Session - 11/23/23 0846     Visit Number 24    Number of Visits 31    Date for PT Re-Evaluation 12/16/23    Authorization Type [employer]    Authorization Time Period 4/3-5/10/25    Authorization - Visit Number 5    Authorization - Number of Visits 12    Progress Note Due on Visit 29    PT Start Time 0846    PT Stop Time 0931    PT Time Calculation (min) 45 min    Activity Tolerance Patient tolerated treatment well    Behavior During Therapy [facility] for tasks assessed/performed             Past Medical History:  Diagnosis Date   Arthritis    Concussion 09/12/2019   Past Surgical History:  Procedure Laterality Date   APPENDECTOMY  1968   DILATION AND CURETTAGE OF UTERUS  1990   TONSILLECTOMY  1980   Patient Active Problem List   Diagnosis Date Noted   Osteoporosis 10/11/2023   Right hip pain 08/17/2023   Greater trochanteric bursitis, right 05/31/2023   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 02/03/2023   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   Elevated LDL cholesterol level 04/14/2021   Polyarthralgia 03/17/2021   Primary osteoarthritis right first [facility] and right second MCP, right trigger thumb 04/24/2018   Numbness of right third and fourth toes 03/19/2018   Primary osteoarthritis of both hips 12/08/2015   Headache 08/12/2010   Lumbar degenerative disc disease 01/07/2008    PCP: Cydney Draft, MD   REFERRING PROVIDER: Gean Keels, MD   REFERRING DIAG: [phone] (ICD-10-CM) - Right hip pain   M51.362 (ICD-10-CM) - Degeneration of intervertebral disc of lumbar region with discogenic back pain and lower extremity pain   THERAPY DIAG:  Pain in right hip  Other abnormalities of gait and mobility  Muscle weakness  (generalized)  Other low back pain  Rationale for Evaluation and Treatment: Rehabilitation  ONSET DATE: May 2024 with recent worsening January 2025   SUBJECTIVE:   SUBJECTIVE STATEMENT: Patient had f/u with Dr. Elva Hamburger and they discussed options for the back. She was instructed to up her gabapentin and then will f/u once she returns from her trip. Did not get an injection in the hip.   EVAL: Patient reports she tore her hamstring in May 2024 and wasn't diagnosed until June. She had PT for this injury. The hamstring pain is still there, but not as bad. She is now having worsening pain along the side/front of hip that started about 2 weeks ago. She reports that it was swollen. The hip was so painful that she couldn't abduct the hip. She increased her walking speed right around the time that this pain began in the front/side of the hip, so is unsure if this was the cause of her new area of pain. She saw Dr. Elva Hamburger and had an injection, which seems to have helped with her pain. Rotational movement or transitioning from sit to stand still bothers the hip. Prior to this pain she was walking daily about 3 miles.   PERTINENT HISTORY: Rt hip injection 08/17/23 Osteoporosis  PAIN:  Are you having pain? Yes: NPRS scale: 1/10 Pain location: bilateral lateral thighs Pain description: dull Aggravating factors: rotational movement, sitting,  transitioning from sitting  Relieving factors: injection,rest  PRECAUTIONS: None  RED FLAGS: None   WEIGHT BEARING RESTRICTIONS: No  FALLS:  Has patient fallen in last 6 months? No  LIVING ENVIRONMENT: Lives with: lives with their spouse Lives in: House/apartment Stairs: Yes: Internal: 13 steps; on left going up Has following equipment at home: None  OCCUPATION: retired   PLOF: Independent  PATIENT GOALS: "I would love to have no pain and resume back to normal; make it more manageable."   NEXT MD VISIT: 11/21/2023  OBJECTIVE:  Note: Objective measures were  completed at Evaluation unless otherwise noted.  DIAGNOSTIC FINDINGS:  Rt Hip MRI: IMPRESSION: 1. Right hamstring tendinosis with a full-thickness minimally retracted tear of the biceps femoris tendon origin with peritendinous edema. 2. Moderate volume fluid within the right ischiogluteal bursa. 3. Mild tendinosis of the contralateral left hamstring origin. 4. Mild tendinosis of the bilateral gluteus medius and minimus tendons without tear. 5. Mild osteoarthritis of the right hip.  PATIENT SURVEYS:  FOTO 56% function; 64% predicted  09/26/23: 73% function   COGNITION: Overall cognitive status: Within functional limits for tasks assessed     SENSATION: Not tested  EDEMA:  Not inspected   MUSCLE LENGTH: Not tested   POSTURE: No Significant postural limitations  PALPATION: TTP Rt greater trochanter, lateral gluteals  11/01/23: gentle PAIVM with patient reporting pain isolated to the low back at L5-S1   LOWER EXTREMITY ROM:  Active ROM Right eval Left eval 09/06/23 Right  09/26/23 Right  11/01/23 Right    Hip flexion Full   Full  Full dull pain anterolateral hip with lowering  Full   Hip extension       Hip abduction Full pain   Full pain anterior hip/thigh Full    Hip adduction       Hip internal rotation 30 pain   28 pain 30 twinge at greater trochanter 30 mild pain at greater trochanter   Hip external rotation 28  30 35 sharp pain lateral thigh  35 lateral thigh pain  Knee flexion       Knee extension       Ankle dorsiflexion       Ankle plantarflexion       Ankle inversion       Ankle eversion        (Blank rows = not tested)  LOWER EXTREMITY MMT:  MMT Right eval Left eval 09/06/23 Right  09/26/23 Right  10/20/23 Right  11/01/23  Hip flexion 4+ 4  4  Rt: 5 lateral thigh pain; Lt: 4+  Hip extension 4 5  4   Lt: 5; Rt: 4  Hip abduction 3- pain anterior hip 5 3+ pain anterolateral hip 3+ pain greater trochanter 4-  Lt: 4+; Rt: 4- mild glute pain  Hip  adduction 5       Hip internal rotation 4+ "twinge" 4  4+ twinge greater trochanter  5 bilateral; mild pain Rt greater trochanter  Hip external rotation 5 5  5  5  bilateral   Knee flexion 5 5  In prone 4 biased to bicep femoris     Knee extension 5 5      Ankle dorsiflexion        Ankle plantarflexion        Ankle inversion        Ankle eversion         (Blank rows = not tested) LUMBAR ROM:   Active  A/PROM  11/01/23  Flexion Finger  tips to floor; pain in low back on ascent   Extension WNL low back pain  Right lateral flexion WNL lateral thigh pain  Left lateral flexion WNL low back pain  Right rotation WNL lateral hip pain  Left rotation WNL   (Blank rows = not tested)  SPECIAL TESTS:  11/01/23: (-) SLR   FUNCTIONAL TESTS:  Not tested   GAIT: Distance walked: 20 ft  Assistive device utilized: None Level of assistance: Complete Independence Comments: Trendelenburg Rt [facility] Adult PT Treatment:                                                DATE: 11/23/23 Therapeutic Exercise: Prone pressup x 10  HEP update   Neuromuscular re-ed: 90/90 toe tap 2 x 10  Figure 4 bridge x 10 each  Therapeutic Activity: Hip hinge with dowel x 10  Hip hinge to 6 inch step with 5 lbs x 10 Hip hinge with 2 lb dumbbells x 5  Self Care: Discussed lumbar anatomy as it relates to pathology, educating patient on effects of flexion and extension of the back Discussed proper/supportive footwear for trip with recommendations on where to purchase.  Recommended to begin small inclined treadmill walking for 1 mile   [facility] Adult PT Treatment:                                                DATE: 11/21/2023 Therapeutic Exercise: Treadmill: 1.8-2.5 mph, 0% - 4% incline x 5 min Prone press up x10 Prone on elbows  3x10" Neuromuscular re-ed: Modified plank from quadruped --> rock forward + orange ball b/w thighs  Plank progression + orange ball b/w thighs --> knee taps Wall squat + orange b/w thighs Glute  med press with orange ball in wall (B) R SLS toe tap on colored dots --> single leg squat variations   [facility] Adult PT Treatment:                                                DATE: 11/16/2023 Therapeutic Exercise: Prone press up 2x10 Supine: Double leg stretch + orange ball b/w knees Double leg stretch + black band around ankles --> around knees  Neuromuscular re-ed: Standing hip hinge deadlift --> added small knee bend Modified single leg deadlift Staggered stance lunge (back heel lifted) + straight arm press down with RTB for ab bracing x10 each leg Blue TB boxing --> double arm --> alt single arm --> small circles Kneeling chest expansion + GTB   [facility] Adult PT Treatment:                                                DATE: 10/30/23 Therapeutic Exercise: Prone quad stretch x 1 minute  Manual Therapy: Skilled palpation of trigger points Trigger Point Dry Needling  Subsequent Treatment: Instructions reviewed, if requested by the patient, prior to subsequent dry needling treatment.   Patient Verbal Consent Given: Yes Education Handout Provided:  Previously Provided Muscles Treated: Rt vastus lateralis  Electrical Stimulation Performed: No Treatment Response/Outcome: twitch response elicited    Neuromuscular re-ed: Sidelying hip abduction x 10, x 10 @ 1 lb  Bird dog 2 x 10  Standing hip flexion on airex 2 x 10  Standing hip abduction on airex x 10 each   Self Care: Walking progression to begin outdoor walking     PATIENT EDUCATION:  Education details: HEP update; see treatment Person educated: Patient Education method: Explanation,demo, cues, handout Education comprehension: verbalized understanding, returned demo  HOME EXERCISE PROGRAM: Access Code: [email or web address] URL: [email or web address] Date: 11/23/2023 Prepared by: Letitia Libra  Exercises - Prone Press Up  - 6 x daily - 7 x weekly - 1 sets - 10 reps - Standing Lumbar Extension  - 6 x daily - 7 x  weekly - 1 sets - 10 reps - Quadruped Full Range Thoracic Rotation with Reach  - 1 x daily - 7 x weekly - 1 sets - 10 reps - Supine Lower Trunk Rotation  - 1 x daily - 7 x weekly - 1 sets - 10 reps - Quadruped Alternating Leg Extensions  - 1 x daily - 3 x weekly - 2 sets - 10 reps - Sidelying Hip Abduction  - 1 x daily - 3 x weekly - 2 sets - 10 reps - Standing Hip Extension with Counter Support  - 1 x daily - 3 x weekly - 2 sets - 10 reps - Standing Hip Abduction with Counter Support  - 1 x daily - 3 x weekly - 2 sets - 10 reps - Kneeling Plank with Feet on Ground  - 1 x daily - 3 x weekly - 3 sets - 10 reps - Deadlift With Dumbbells  - 1 x daily - 3 x weekly - 2 sets - 10 reps - Figure 4 Bridge  - 1 x daily - 3 x weekly - 2 sets - 10 reps  ASSESSMENT:  CLINICAL IMPRESSION: Lengthy discussion regarding back anatomy and recommendation to avoid/limit lumbar flexion activity. We also discussed supportive footwear options for upcoming trip with patient having plans to purchase new shoes today. Focused on progression of core stabilization and lifting with good tolerance. She demonstrates good form with hip hinge utilizing dowel, but requires cues when initiating weighted hip hinge. With cues she is able to correct excessive trunk flexion. No increase in pain reported with strength progression today.   RE-EVAL: Patient with new referral for degeneration of intervertebral disc of lumbar region with discogenic back pain and lower extremity pain, so re-evaluation was performed today for this new complaint as well as re-assessment of initial lateral hip pain. In regards to her lateral hip pain this continues to steadily improve with patient noted to improvements in hip strength and mild hip pain at the greater trochanter is elicited with hip IR AROM. In regards to her back she reports onset of low back pain a few weeks ago and prior to this in February was devoloping Rt lateral thigh pain. Overall she has  good lumbar AROM, though pain is elicited in the low back with majority of ROM with concordant thigh pain elicited with Rt lateral flexion. She will benefit from skilled PT to address ongoing hip weakness and spinal/hip mobility deficits in order to optimize her function and assist in overall pain reduction.   EVAL: Patient is a 68 y.o. female who was seen today for physical therapy evaluation and treatment for acute on  chronic Rt  hip pain. She reports worsening of anterolateral hip pain that began about 2 weeks ago when she increased her walking speed during her typical daily walking routine. Upon assessment she is noted to have normal ROM about the hip reporting pain with active abduction and IR. She has significant hip abductor weakness with trendelenburg gait noted on the RLE. She will benefit from skilled PT to address the above stated deficits in order to optimize her function and assist with overall pain reduction.   OBJECTIVE IMPAIRMENTS: Abnormal gait, decreased activity tolerance, decreased balance, decreased knowledge of condition, difficulty walking, decreased strength, improper body mechanics, and pain.   ACTIVITY LIMITATIONS: carrying, lifting, bending, sitting, stairs, transfers, and locomotion level  PARTICIPATION LIMITATIONS: meal prep, cleaning, laundry, shopping, community activity, yard work, and recreation   PERSONAL FACTORS: Age, Time since onset of injury/illness/exacerbation, and 1 comorbidity: osteoarthritis  are also affecting patient's functional outcome.   REHAB POTENTIAL: Good  CLINICAL DECISION MAKING: Stable/uncomplicated  EVALUATION COMPLEXITY: Low   GOALS: Goals reviewed with patient? Yes  SHORT TERM GOALS: Target date: 09/13/2023  Patient will be independent and compliant with initial HEP.  Baseline: issued at eval.  Goal status: MET  2.  Patient will demonstrate pain free Rt hip AROM to improve ability to complete reaching and bending activity.   Baseline: pain with IR and abduction AROM Goal status: partially met   3.  Patient will be able to maintain SLS on the RLE with no pelvic drop to improve gait stability.  Baseline: Trendelenburg  09/06/23: unable to maintain level pelvis without UE support  09/26/23: negative Trendelenburg  Goal status: MET   LONG TERM GOALS: Target date: 12/16/23  Patient will score at least 64% on FOTO to signify clinically meaningful improvement in functional abilities.  Baseline: see above  Goal status: MET  2.  Patient will demonstrate at least 4/5 Rt hip abductor strength to improve stability with walking.  Baseline: see above Goal status: REVISED   3.  Patient will report pain at worst rated as </=2/10 to reduce current functional limitations.   Baseline: 5 Goal status: MET  4. Patient will have no onset of lateral thigh pain with lumbar AROM indicative of improvement in current condition.   Baseline: see ROM chart above  Goal status: NEW   5. Patient will be able to tolerate walking at least 1 mile of outdoor walking with minimal/no back/RLE pain.   Baseline: has tolerated treadmill walking, is avoiding hills with initiating outdoor walking  Goal Status: NEW     PLAN:  PT FREQUENCY: 2x/week  PT DURATION: 6 weeks  PLANNED INTERVENTIONS: 97164- PT Re-evaluation, 97110-Therapeutic exercises, 97530- Therapeutic activity, [id number]- Neuromuscular re-education, 97535- Self Care, 16109- Manual therapy, [id number]- Gait training, [phone]- Aquatic Therapy, [phone]- Electrical stimulation (unattended), [id number]- Electrical stimulation (manual), [id number]- Ionotophoresis 4mg /ml Dexamethasone, Dry Needling, Cryotherapy, and Moist heat  PLAN FOR NEXT SESSION: progress glute med/HS strength as tolerated. Hip strengthening; single leg exercises/activities; Progress lifting working on hip hinge/squat mechanics. (Try incorporating more standing activity that patient can complete on upcoming trip)   Jaimy Kliethermes, PT,  DPT, ATC 11/23/23 9:39 AM

## 2023-11-23 NOTE — Telephone Encounter (Signed)
 Copied from CRM [id number]. Topic: Clinical - Prescription Issue >> Nov 22, 2023  4:27 PM Jayson Michael wrote: Reason for CRM: [facility] called on behalf of the patient regarding her prescriptions for Gabapentin (Neurontin) 300 mg and Celecoxib (Celebrex) 200 mg.  These were originally sent to:  [facility] [phone] [address] [place], [place],  [address] (at the [facility] [place]) Sent on: 11/20/2023  Cassandra from [place] stated that the patient would like these prescriptions to be sent to [facility] instead. [facility] attempted to request a transfer from [facility], but [facility] refused.  Please resend prescriptions to [facility] if possible.

## 2023-11-27 ENCOUNTER — Other Ambulatory Visit: Payer: Self-pay | Admitting: Sports Medicine

## 2023-11-27 DIAGNOSIS — M51362 Other intervertebral disc degeneration, lumbar region with discogenic back pain and lower extremity pain: Secondary | ICD-10-CM

## 2023-11-27 MED ORDER — GABAPENTIN 300 MG PO CAPS: 600.0000 mg | ORAL_CAPSULE | Freq: Three times a day (TID) | ORAL | 3 refills | Status: AC

## 2023-11-28 ENCOUNTER — Ambulatory Visit

## 2023-11-28 DIAGNOSIS — R2689 Other abnormalities of gait and mobility: Secondary | ICD-10-CM | POA: Diagnosis not present

## 2023-11-28 DIAGNOSIS — M6281 Muscle weakness (generalized): Secondary | ICD-10-CM

## 2023-11-28 DIAGNOSIS — M5459 Other low back pain: Secondary | ICD-10-CM

## 2023-11-28 DIAGNOSIS — M25551 Pain in right hip: Secondary | ICD-10-CM | POA: Diagnosis not present

## 2023-11-28 DIAGNOSIS — M25651 Stiffness of right hip, not elsewhere classified: Secondary | ICD-10-CM

## 2023-11-28 NOTE — Therapy (Signed)
 OUTPATIENT PHYSICAL THERAPY LOWER EXTREMITY TREATMENT   Patient Name: Becky Bowers MRN: [id number] DOB:1956-03-25, 68 y.o., female Today's Date: 11/28/2023  END OF SESSION:  PT End of Session - 11/28/23 0851     Visit Number 25    Number of Visits 31    Date for PT Re-Evaluation 12/16/23    Authorization Type [employer]    Authorization Time Period 4/3-5/10/25    Authorization - Visit Number 6    Authorization - Number of Visits 12    Progress Note Due on Visit 29    PT Start Time 0851    PT Stop Time 0931    PT Time Calculation (min) 40 min    Activity Tolerance Patient tolerated treatment well    Behavior During Therapy [facility] for tasks assessed/performed            Past Medical History:  Diagnosis Date   Arthritis    Concussion 09/12/2019   Past Surgical History:  Procedure Laterality Date   APPENDECTOMY  1968   DILATION AND CURETTAGE OF UTERUS  1990   TONSILLECTOMY  1980   Patient Active Problem List   Diagnosis Date Noted   Osteoporosis 10/11/2023   Right hip pain 08/17/2023   Greater trochanteric bursitis, right 05/31/2023   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 02/03/2023   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   Elevated LDL cholesterol level 04/14/2021   Polyarthralgia 03/17/2021   Primary osteoarthritis right first [facility] and right second MCP, right trigger thumb 04/24/2018   Numbness of right third and fourth toes 03/19/2018   Primary osteoarthritis of both hips 12/08/2015   Headache 08/12/2010   Lumbar degenerative disc disease 01/07/2008    PCP: Cydney Draft, MD   REFERRING PROVIDER: Gean Keels, MD   REFERRING DIAG: [phone] (ICD-10-CM) - Right hip pain   M51.362 (ICD-10-CM) - Degeneration of intervertebral disc of lumbar region with discogenic back pain and lower extremity pain   THERAPY DIAG:  Pain in right hip  Other abnormalities of gait and mobility  Muscle weakness (generalized)  Other  low back pain  Stiffness of right hip, not elsewhere classified  Rationale for Evaluation and Treatment: Rehabilitation  ONSET DATE: May 2024 with recent worsening January 2025   SUBJECTIVE:   SUBJECTIVE STATEMENT: Patient reports her hip is sore after pilates yesterday but overall is feeling better and stronger; states she got new sneakers.   EVAL: Patient reports she tore her hamstring in May 2024 and wasn't diagnosed until June. She had PT for this injury. The hamstring pain is still there, but not as bad. She is now having worsening pain along the side/front of hip that started about 2 weeks ago. She reports that it was swollen. The hip was so painful that she couldn't abduct the hip. She increased her walking speed right around the time that this pain began in the front/side of the hip, so is unsure if this was the cause of her new area of pain. She saw Dr. Elva Hamburger and had an injection, which seems to have helped with her pain. Rotational movement or transitioning from sit to stand still bothers the hip. Prior to this pain she was walking daily about 3 miles.   PERTINENT HISTORY: Rt hip injection 08/17/23 Osteoporosis  PAIN:  Are you having pain? Yes: NPRS scale: 1/10 Pain location: bilateral lateral thighs Pain description: dull Aggravating factors: rotational movement, sitting, transitioning from sitting  Relieving factors: injection,rest  PRECAUTIONS: None  RED  FLAGS: None   WEIGHT BEARING RESTRICTIONS: No  FALLS:  Has patient fallen in last 6 months? No  LIVING ENVIRONMENT: Lives with: lives with their spouse Lives in: House/apartment Stairs: Yes: Internal: 13 steps; on left going up Has following equipment at home: None  OCCUPATION: retired   PLOF: Independent  PATIENT GOALS: "I would love to have no pain and resume back to normal; make it more manageable."   NEXT MD VISIT: 11/21/2023  OBJECTIVE:  Note: Objective measures were completed at Evaluation unless  otherwise noted.  DIAGNOSTIC FINDINGS:  Rt Hip MRI: IMPRESSION: 1. Right hamstring tendinosis with a full-thickness minimally retracted tear of the biceps femoris tendon origin with peritendinous edema. 2. Moderate volume fluid within the right ischiogluteal bursa. 3. Mild tendinosis of the contralateral left hamstring origin. 4. Mild tendinosis of the bilateral gluteus medius and minimus tendons without tear. 5. Mild osteoarthritis of the right hip.  PATIENT SURVEYS:  FOTO 56% function; 64% predicted  09/26/23: 73% function   COGNITION: Overall cognitive status: Within functional limits for tasks assessed     SENSATION: Not tested  EDEMA:  Not inspected   MUSCLE LENGTH: Not tested   POSTURE: No Significant postural limitations  PALPATION: TTP Rt greater trochanter, lateral gluteals  11/01/23: gentle PAIVM with patient reporting pain isolated to the low back at L5-S1   LOWER EXTREMITY ROM:  Active ROM Right eval Left eval 09/06/23 Right  09/26/23 Right  11/01/23 Right    Hip flexion Full   Full  Full dull pain anterolateral hip with lowering  Full   Hip extension       Hip abduction Full pain   Full pain anterior hip/thigh Full    Hip adduction       Hip internal rotation 30 pain   28 pain 30 twinge at greater trochanter 30 mild pain at greater trochanter   Hip external rotation 28  30 35 sharp pain lateral thigh  35 lateral thigh pain  Knee flexion       Knee extension       Ankle dorsiflexion       Ankle plantarflexion       Ankle inversion       Ankle eversion        (Blank rows = not tested)  LOWER EXTREMITY MMT:  MMT Right eval Left eval 09/06/23 Right  09/26/23 Right  10/20/23 Right  11/01/23  Hip flexion 4+ 4  4  Rt: 5 lateral thigh pain; Lt: 4+  Hip extension 4 5  4   Lt: 5; Rt: 4  Hip abduction 3- pain anterior hip 5 3+ pain anterolateral hip 3+ pain greater trochanter 4-  Lt: 4+; Rt: 4- mild glute pain  Hip adduction 5       Hip internal  rotation 4+ "twinge" 4  4+ twinge greater trochanter  5 bilateral; mild pain Rt greater trochanter  Hip external rotation 5 5  5  5  bilateral   Knee flexion 5 5  In prone 4 biased to bicep femoris     Knee extension 5 5      Ankle dorsiflexion        Ankle plantarflexion        Ankle inversion        Ankle eversion         (Blank rows = not tested) LUMBAR ROM:   Active  A/PROM  11/01/23  Flexion Finger tips to floor; pain in low back on ascent   Extension  WNL low back pain  Right lateral flexion WNL lateral thigh pain  Left lateral flexion WNL low back pain  Right rotation WNL lateral hip pain  Left rotation WNL   (Blank rows = not tested)  SPECIAL TESTS:  11/01/23: (-) SLR   FUNCTIONAL TESTS:  Not tested   GAIT: Distance walked: 20 ft  Assistive device utilized: None Level of assistance: Complete Independence Comments: Trendelenburg Rt   [facility] Adult PT Treatment:                                                DATE: 11/28/2023 Therapeutic Exercise: Treadmill warm-up: 2.5 mph, 0-4% incline x 6 min Wide leg wipers Walking dynamic HS stetch Walking --> standing quad stretch Standing figure 4 stretch Neuromuscular re-ed: 90/90 alt toe taps --> alt heel taps --> double toe taps Prone press up progression (elbows --> full up) --> focus on relaxing abdominals SLS divers (chair target)    [facility] Adult PT Treatment:                                                DATE: 11/23/23 Therapeutic Exercise: Prone pressup x 10  HEP update  Neuromuscular re-ed: 90/90 toe tap 2 x 10  Figure 4 bridge x 10 each  Therapeutic Activity: Hip hinge with dowel x 10  Hip hinge to 6 inch step with 5 lbs x 10 Hip hinge with 2 lb dumbbells x 5  Self Care: Discussed lumbar anatomy as it relates to pathology, educating patient on effects of flexion and extension of the back Discussed proper/supportive footwear for trip with recommendations on where to purchase.  Recommended to begin small  inclined treadmill walking for 1 mile    [facility] Adult PT Treatment:                                                DATE: 11/21/2023 Therapeutic Exercise: Treadmill: 1.8-2.5 mph, 0% - 4% incline x 5 min Prone press up x10 Prone on elbows  3x10" Neuromuscular re-ed: Modified plank from quadruped --> rock forward + orange ball b/w thighs  Plank progression + orange ball b/w thighs --> knee taps Wall squat + orange b/w thighs Glute med press with orange ball in wall (B) R SLS toe tap on colored dots --> single leg squat variations   [facility] Adult PT Treatment:                                                DATE: 10/30/23 Therapeutic Exercise: Prone quad stretch x 1 minute  Manual Therapy: Skilled palpation of trigger points Trigger Point Dry Needling  Subsequent Treatment: Instructions reviewed, if requested by the patient, prior to subsequent dry needling treatment.   Patient Verbal Consent Given: Yes Education Handout Provided: Previously Provided Muscles Treated: Rt vastus lateralis  Electrical Stimulation Performed: No Treatment Response/Outcome: twitch response elicited    Neuromuscular re-ed: Sidelying hip abduction x 10, x 10 @  1 lb  Bird dog 2 x 10  Standing hip flexion on airex 2 x 10  Standing hip abduction on airex x 10 each   Self Care: Walking progression to begin outdoor walking     PATIENT EDUCATION:  Education details: HEP update; see treatment Person educated: Patient Education method: Explanation,demo, cues, handout Education comprehension: verbalized understanding, returned demo  HOME EXERCISE PROGRAM: Access Code: [email or web address] URL: [email or web address] Date: 11/28/2023 Prepared by: Sims Duck  Exercises - Prone Press Up  - 6 x daily - 7 x weekly - 1 sets - 10 reps - Standing Lumbar Extension  - 6 x daily - 7 x weekly - 1 sets - 10 reps - Quadruped Full Range Thoracic Rotation with Reach  - 1 x daily - 7 x weekly - 1 sets - 10 reps -  Supine Lower Trunk Rotation  - 1 x daily - 7 x weekly - 1 sets - 10 reps - Quadruped Alternating Leg Extensions  - 1 x daily - 3 x weekly - 2 sets - 10 reps - Sidelying Hip Abduction  - 1 x daily - 3 x weekly - 2 sets - 10 reps - Standing Hip Extension with Counter Support  - 1 x daily - 3 x weekly - 2 sets - 10 reps - Standing Hip Abduction with Counter Support  - 1 x daily - 3 x weekly - 2 sets - 10 reps - Kneeling Plank with Feet on Ground  - 1 x daily - 3 x weekly - 3 sets - 10 reps - Deadlift With Dumbbells  - 1 x daily - 3 x weekly - 2 sets - 10 reps - Figure 4 Bridge  - 1 x daily - 3 x weekly - 2 sets - 10 reps - Walking Hamstring Stretch  - 1 x daily - 7 x weekly - 3 sets - 10 reps - Standing Quadriceps Stretch  - 1 x daily - 7 x weekly - 3 sets - 10 reps - Standing Figure 4 Piriformis Stretch at Wall  - 1 x daily - 7 x weekly - 3 sets - 10 reps - Walking Knee to Chest  - 1 x daily - 7 x weekly - 3 sets - 10 reps  ASSESSMENT:  CLINICAL IMPRESSION: Session focused on standing stretches and exercises in preparation for upcoming travel. Improved stability noted with single leg stance exercises. HEP updated with exercises for travel.  RE-EVAL: Patient with new referral for degeneration of intervertebral disc of lumbar region with discogenic back pain and lower extremity pain, so re-evaluation was performed today for this new complaint as well as re-assessment of initial lateral hip pain. In regards to her lateral hip pain this continues to steadily improve with patient noted to improvements in hip strength and mild hip pain at the greater trochanter is elicited with hip IR AROM. In regards to her back she reports onset of low back pain a few weeks ago and prior to this in February was devoloping Rt lateral thigh pain. Overall she has good lumbar AROM, though pain is elicited in the low back with majority of ROM with concordant thigh pain elicited with Rt lateral flexion. She will benefit from  skilled PT to address ongoing hip weakness and spinal/hip mobility deficits in order to optimize her function and assist in overall pain reduction.   EVAL: Patient is a 68 y.o. female who was seen today for physical therapy evaluation and treatment for acute on  chronic Rt hip  pain. She reports worsening of anterolateral hip pain that began about 2 weeks ago when she increased her walking speed during her typical daily walking routine. Upon assessment she is noted to have normal ROM about the hip reporting pain with active abduction and IR. She has significant hip abductor weakness with trendelenburg gait noted on the RLE. She will benefit from skilled PT to address the above stated deficits in order to optimize her function and assist with overall pain reduction.   OBJECTIVE IMPAIRMENTS: Abnormal gait, decreased activity tolerance, decreased balance, decreased knowledge of condition, difficulty walking, decreased strength, improper body mechanics, and pain.   ACTIVITY LIMITATIONS: carrying, lifting, bending, sitting, stairs, transfers, and locomotion level  PARTICIPATION LIMITATIONS: meal prep, cleaning, laundry, shopping, community activity, yard work, and recreation   PERSONAL FACTORS: Age, Time since onset of injury/illness/exacerbation, and 1 comorbidity: osteoarthritis  are also affecting patient's functional outcome.   REHAB POTENTIAL: Good  CLINICAL DECISION MAKING: Stable/uncomplicated  EVALUATION COMPLEXITY: Low   GOALS: Goals reviewed with patient? Yes  SHORT TERM GOALS: Target date: 09/13/2023  Patient will be independent and compliant with initial HEP.  Baseline: issued at eval.  Goal status: MET  2.  Patient will demonstrate pain free Rt hip AROM to improve ability to complete reaching and bending activity.  Baseline: pain with IR and abduction AROM Goal status: partially met   3.  Patient will be able to maintain SLS on the RLE with no pelvic drop to improve gait  stability.  Baseline: Trendelenburg  09/06/23: unable to maintain level pelvis without UE support  09/26/23: negative Trendelenburg  Goal status: MET   LONG TERM GOALS: Target date: 12/16/23  Patient will score at least 64% on FOTO to signify clinically meaningful improvement in functional abilities.  Baseline: see above  Goal status: MET  2.  Patient will demonstrate at least 4/5 Rt hip abductor strength to improve stability with walking.  Baseline: see above Goal status: REVISED   3.  Patient will report pain at worst rated as </=2/10 to reduce current functional limitations.   Baseline: 5 Goal status: MET  4. Patient will have no onset of lateral thigh pain with lumbar AROM indicative of improvement in current condition.   Baseline: see ROM chart above  Goal status: NEW   5. Patient will be able to tolerate walking at least 1 mile of outdoor walking with minimal/no back/RLE pain.   Baseline: has tolerated treadmill walking, is avoiding hills with initiating outdoor walking  Goal Status: NEW     PLAN:  PT FREQUENCY: 2x/week  PT DURATION: 6 weeks  PLANNED INTERVENTIONS: 97164- PT Re-evaluation, 97110-Therapeutic exercises, 97530- Therapeutic activity, [id number]- Neuromuscular re-education, 97535- Self Care, [id number]- Manual therapy, [id number]- Gait training, [phone]- Aquatic Therapy, [phone]- Electrical stimulation (unattended), [id number]- Electrical stimulation (manual), [id number]- Ionotophoresis 4mg /ml Dexamethasone , Dry Needling, Cryotherapy, and Moist heat  PLAN FOR NEXT SESSION: progress glute med/HS strength as tolerated. Hip strengthening; single leg exercises/activities; Progress lifting working on hip hinge/squat mechanics. (Try incorporating more standing activity that patient can complete on upcoming trip)   Sims Duck, PTA 11/28/23 11:43 AM

## 2023-11-30 ENCOUNTER — Ambulatory Visit

## 2023-11-30 DIAGNOSIS — M6281 Muscle weakness (generalized): Secondary | ICD-10-CM

## 2023-11-30 DIAGNOSIS — R2689 Other abnormalities of gait and mobility: Secondary | ICD-10-CM | POA: Diagnosis not present

## 2023-11-30 DIAGNOSIS — M5459 Other low back pain: Secondary | ICD-10-CM

## 2023-11-30 DIAGNOSIS — M25551 Pain in right hip: Secondary | ICD-10-CM | POA: Diagnosis not present

## 2023-11-30 DIAGNOSIS — M25651 Stiffness of right hip, not elsewhere classified: Secondary | ICD-10-CM | POA: Diagnosis not present

## 2023-11-30 NOTE — Therapy (Signed)
 OUTPATIENT PHYSICAL THERAPY LOWER EXTREMITY TREATMENT   Patient Name: Becky Bowers MRN: [id number] DOB:1956/01/30, 68 y.o., female Today's Date: 11/30/2023  END OF SESSION:  PT End of Session - 11/30/23 0846     Visit Number 26    Number of Visits 31    Date for PT Re-Evaluation 12/16/23    Authorization Type [employer]    Authorization Time Period 4/3-5/10/25    Authorization - Visit Number 7    Authorization - Number of Visits 12    Progress Note Due on Visit 29    PT Start Time 0846    PT Stop Time 0928    PT Time Calculation (min) 42 min    Activity Tolerance Patient tolerated treatment well    Behavior During Therapy [facility] for tasks assessed/performed             Past Medical History:  Diagnosis Date   Arthritis    Concussion 09/12/2019   Past Surgical History:  Procedure Laterality Date   APPENDECTOMY  1968   DILATION AND CURETTAGE OF UTERUS  1990   TONSILLECTOMY  1980   Patient Active Problem List   Diagnosis Date Noted   Osteoporosis 10/11/2023   Right hip pain 08/17/2023   Greater trochanteric bursitis, right 05/31/2023   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 02/03/2023   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   Elevated LDL cholesterol level 04/14/2021   Polyarthralgia 03/17/2021   Primary osteoarthritis right first [facility] and right second MCP, right trigger thumb 04/24/2018   Numbness of right third and fourth toes 03/19/2018   Primary osteoarthritis of both hips 12/08/2015   Headache 08/12/2010   Lumbar degenerative disc disease 01/07/2008    PCP: Cydney Draft, MD   REFERRING PROVIDER: Gean Keels, MD   REFERRING DIAG: [id number] (ICD-10-CM) - Right hip pain   M51.362 (ICD-10-CM) - Degeneration of intervertebral disc of lumbar region with discogenic back pain and lower extremity pain   THERAPY DIAG:  Pain in right hip  Other abnormalities of gait and mobility  Muscle weakness  (generalized)  Other low back pain  Rationale for Evaluation and Treatment: Rehabilitation  ONSET DATE: May 2024 with recent worsening January 2025   SUBJECTIVE:   SUBJECTIVE STATEMENT: Patient reports overall she has had 2 good weeks. She went to [place] yesterday and felt her hamstring last night, but put ice on it and that was better. She leaves for her [place] vacation next week and will be gone for 3 weeks.   EVAL: Patient reports she tore her hamstring in May 2024 and wasn't diagnosed until June. She had PT for this injury. The hamstring pain is still there, but not as bad. She is now having worsening pain along the side/front of hip that started about 2 weeks ago. She reports that it was swollen. The hip was so painful that she couldn't abduct the hip. She increased her walking speed right around the time that this pain began in the front/side of the hip, so is unsure if this was the cause of her new area of pain. She saw Dr. Elva Hamburger and had an injection, which seems to have helped with her pain. Rotational movement or transitioning from sit to stand still bothers the hip. Prior to this pain she was walking daily about 3 miles.   PERTINENT HISTORY: Rt hip injection 08/17/23 Osteoporosis  PAIN:  Are you having pain? Yes: NPRS scale: 1/10 Pain location: bilateral lateral thighs Pain description: dull  Aggravating factors: rotational movement, sitting, transitioning from sitting  Relieving factors: injection,rest  PRECAUTIONS: None  RED FLAGS: None   WEIGHT BEARING RESTRICTIONS: No  FALLS:  Has patient fallen in last 6 months? No  LIVING ENVIRONMENT: Lives with: lives with their spouse Lives in: House/apartment Stairs: Yes: Internal: 13 steps; on left going up Has following equipment at home: None  OCCUPATION: retired   PLOF: Independent  PATIENT GOALS: "I would love to have no pain and resume back to normal; make it more manageable."   NEXT MD VISIT:  11/21/2023  OBJECTIVE:  Note: Objective measures were completed at Evaluation unless otherwise noted.  DIAGNOSTIC FINDINGS:  Rt Hip MRI: IMPRESSION: 1. Right hamstring tendinosis with a full-thickness minimally retracted tear of the biceps femoris tendon origin with peritendinous edema. 2. Moderate volume fluid within the right ischiogluteal bursa. 3. Mild tendinosis of the contralateral left hamstring origin. 4. Mild tendinosis of the bilateral gluteus medius and minimus tendons without tear. 5. Mild osteoarthritis of the right hip.  PATIENT SURVEYS:  FOTO 56% function; 64% predicted  09/26/23: 73% function   COGNITION: Overall cognitive status: Within functional limits for tasks assessed     SENSATION: Not tested  EDEMA:  Not inspected   MUSCLE LENGTH: Not tested   POSTURE: No Significant postural limitations  PALPATION: TTP Rt greater trochanter, lateral gluteals  11/01/23: gentle PAIVM with patient reporting pain isolated to the low back at L5-S1   LOWER EXTREMITY ROM:  Active ROM Right eval Left eval 09/06/23 Right  09/26/23 Right  11/01/23 Right    Hip flexion Full   Full  Full dull pain anterolateral hip with lowering  Full   Hip extension       Hip abduction Full pain   Full pain anterior hip/thigh Full    Hip adduction       Hip internal rotation 30 pain   28 pain 30 twinge at greater trochanter 30 mild pain at greater trochanter   Hip external rotation 28  30 35 sharp pain lateral thigh  35 lateral thigh pain  Knee flexion       Knee extension       Ankle dorsiflexion       Ankle plantarflexion       Ankle inversion       Ankle eversion        (Blank rows = not tested)  LOWER EXTREMITY MMT:  MMT Right eval Left eval 09/06/23 Right  09/26/23 Right  10/20/23 Right  11/01/23  Hip flexion 4+ 4  4  Rt: 5 lateral thigh pain; Lt: 4+  Hip extension 4 5  4   Lt: 5; Rt: 4  Hip abduction 3- pain anterior hip 5 3+ pain anterolateral hip 3+ pain greater  trochanter 4-  Lt: 4+; Rt: 4- mild glute pain  Hip adduction 5       Hip internal rotation 4+ "twinge" 4  4+ twinge greater trochanter  5 bilateral; mild pain Rt greater trochanter  Hip external rotation 5 5  5  5  bilateral   Knee flexion 5 5  In prone 4 biased to bicep femoris     Knee extension 5 5      Ankle dorsiflexion        Ankle plantarflexion        Ankle inversion        Ankle eversion         (Blank rows = not tested) LUMBAR ROM:   Active  A/PROM  11/01/23  Flexion Finger tips to floor; pain in low back on ascent   Extension WNL low back pain  Right lateral flexion WNL lateral thigh pain  Left lateral flexion WNL low back pain  Right rotation WNL lateral hip pain  Left rotation WNL   (Blank rows = not tested)  SPECIAL TESTS:  11/01/23: (-) SLR   FUNCTIONAL TESTS:  Not tested   GAIT: Distance walked: 20 ft  Assistive device utilized: None Level of assistance: Complete Independence Comments: Trendelenburg Rt  [facility] Adult PT Treatment:                                                DATE: 11/30/23 Therapeutic Exercise: Demonstrated,performed, and issued updated HEP- performing all seated and standing exercises that was recommended to perform while on her trip  Self Care: Review of proper lifting mechanics Review of modalities for pain control Posture education    [facility] Adult PT Treatment:                                                DATE: 11/28/2023 Therapeutic Exercise: Treadmill warm-up: 2.5 mph, 0-4% incline x 6 min Wide leg wipers Walking dynamic HS stetch Walking --> standing quad stretch Standing figure 4 stretch Neuromuscular re-ed: 90/90 alt toe taps --> alt heel taps --> double toe taps Prone press up progression (elbows --> full up) --> focus on relaxing abdominals SLS divers (chair target)    [facility] Adult PT Treatment:                                                DATE: 11/23/23 Therapeutic Exercise: Prone pressup x 10  HEP update   Neuromuscular re-ed: 90/90 toe tap 2 x 10  Figure 4 bridge x 10 each  Therapeutic Activity: Hip hinge with dowel x 10  Hip hinge to 6 inch step with 5 lbs x 10 Hip hinge with 2 lb dumbbells x 5  Self Care: Discussed lumbar anatomy as it relates to pathology, educating patient on effects of flexion and extension of the back Discussed proper/supportive footwear for trip with recommendations on where to purchase.  Recommended to begin small inclined treadmill walking for 1 mile      PATIENT EDUCATION:  Education details: HEP update; see treatment Person educated: Patient Education method: Explanation,demo, cues, handout Education comprehension: verbalized understanding, returned demo  HOME EXERCISE PROGRAM: Access Code: [email or web address] URL: [email or web address] Date: 11/30/2023 Prepared by: Forrestine Ike  Exercises - Prone Press Up  - 6 x daily - 7 x weekly - 1 sets - 10 reps - Standing Lumbar Extension  - 6 x daily - 7 x weekly - 1 sets - 10 reps - Quadruped Full Range Thoracic Rotation with Reach  - 1 x daily - 7 x weekly - 1 sets - 10 reps - Supine Lower Trunk Rotation  - 1 x daily - 7 x weekly - 1 sets - 10 reps - Quadruped Alternating Leg Extensions  - 1 x daily - 3 x weekly - 2 sets - 10 reps - Sidelying Hip Abduction  -  1 x daily - 3 x weekly - 2 sets - 10 reps - Standing Hip Extension with Counter Support  - 1 x daily - 3 x weekly - 2 sets - 10 reps - Standing Hip Abduction with Counter Support  - 1 x daily - 3 x weekly - 2 sets - 10 reps - Kneeling Plank with Feet on Ground  - 1 x daily - 3 x weekly - 3 sets - 10 reps - Deadlift With Dumbbells  - 1 x daily - 3 x weekly - 2 sets - 10 reps - Figure 4 Bridge  - 1 x daily - 3 x weekly - 2 sets - 10 reps - Walking Hamstring Stretch  - 1 x daily - 7 x weekly - 3 sets - 10 reps - Standing Quadriceps Stretch  - 1 x daily - 7 x weekly - 3 sets - 10 reps - Standing Figure 4 Piriformis Stretch at Wall  - 1 x daily - 7 x  weekly - 3 sets - 10 reps - Walking Knee to Chest  - 1 x daily - 7 x weekly - 3 sets - 10 reps - Seated Hamstring Stretch  - 1 x daily - 7 x weekly - 3 sets - 30 sec  hold - Seated Figure 4 Piriformis Stretch  - 1 x daily - 7 x weekly - 3 sets - 30 sec  hold - Seated March  - 1 x daily - 7 x weekly - 2 sets - 10 reps - Seated Anterior Pelvic Tilt  - 1 x daily - 7 x weekly - 2 sets - 10 reps - Seated Hip Abduction with Resistance  - 1 x daily - 7 x weekly - 2 sets - 10 reps - Squat with Chair Touch  - 1 x daily - 7 x weekly - 2 sets - 10 reps - Deadlift with Resistance  - 1 x daily - 7 x weekly - 2 sets - 10 reps  ASSESSMENT:  CLINICAL IMPRESSION: Today's session focused on updating and performing HEP that she can better complete while on her [place] vacation including various sitting and standing mobility and strengthening exercises. She tolerated all exercises well without exacerbation of pain. At this time PT will be placed on hold pending overall tolerance to vacation with patient in agreement with this plan.   RE-EVAL: Patient with new referral for degeneration of intervertebral disc of lumbar region with discogenic back pain and lower extremity pain, so re-evaluation was performed today for this new complaint as well as re-assessment of initial lateral hip pain. In regards to her lateral hip pain this continues to steadily improve with patient noted to improvements in hip strength and mild hip pain at the greater trochanter is elicited with hip IR AROM. In regards to her back she reports onset of low back pain a few weeks ago and prior to this in February was devoloping Rt lateral thigh pain. Overall she has good lumbar AROM, though pain is elicited in the low back with majority of ROM with concordant thigh pain elicited with Rt lateral flexion. She will benefit from skilled PT to address ongoing hip weakness and spinal/hip mobility deficits in order to optimize her function and assist in  overall pain reduction.   EVAL: Patient is a 68 y.o. female who was seen today for physical therapy evaluation and treatment for acute on  chronic Rt hip pain. She reports worsening of anterolateral hip pain that began about 2 weeks  ago when she increased her walking speed during her typical daily walking routine. Upon assessment she is noted to have normal ROM about the hip reporting pain with active abduction and IR. She has significant hip abductor weakness with trendelenburg gait noted on the RLE. She will benefit from skilled PT to address the above stated deficits in order to optimize her function and assist with overall pain reduction.   OBJECTIVE IMPAIRMENTS: Abnormal gait, decreased activity tolerance, decreased balance, decreased knowledge of condition, difficulty walking, decreased strength, improper body mechanics, and pain.   ACTIVITY LIMITATIONS: carrying, lifting, bending, sitting, stairs, transfers, and locomotion level  PARTICIPATION LIMITATIONS: meal prep, cleaning, laundry, shopping, community activity, yard work, and recreation   PERSONAL FACTORS: Age, Time since onset of injury/illness/exacerbation, and 1 comorbidity: osteoarthritis  are also affecting patient's functional outcome.   REHAB POTENTIAL: Good  CLINICAL DECISION MAKING: Stable/uncomplicated  EVALUATION COMPLEXITY: Low   GOALS: Goals reviewed with patient? Yes  SHORT TERM GOALS: Target date: 09/13/2023  Patient will be independent and compliant with initial HEP.  Baseline: issued at eval.  Goal status: MET  2.  Patient will demonstrate pain free Rt hip AROM to improve ability to complete reaching and bending activity.  Baseline: pain with IR and abduction AROM Goal status: partially met   3.  Patient will be able to maintain SLS on the RLE with no pelvic drop to improve gait stability.  Baseline: Trendelenburg  09/06/23: unable to maintain level pelvis without UE support  09/26/23: negative Trendelenburg   Goal status: MET   LONG TERM GOALS: Target date: 12/16/23  Patient will score at least 64% on FOTO to signify clinically meaningful improvement in functional abilities.  Baseline: see above  Goal status: MET  2.  Patient will demonstrate at least 4/5 Rt hip abductor strength to improve stability with walking.  Baseline: see above Goal status: REVISED   3.  Patient will report pain at worst rated as </=2/10 to reduce current functional limitations.   Baseline: 5 Goal status: MET  4. Patient will have no onset of lateral thigh pain with lumbar AROM indicative of improvement in current condition.   Baseline: see ROM chart above  Goal status: NEW   5. Patient will be able to tolerate walking at least 1 mile of outdoor walking with minimal/no back/RLE pain.   Baseline: has tolerated treadmill walking, is avoiding hills with initiating outdoor walking  Goal Status: NEW     PLAN:  PT FREQUENCY: 2x/week  PT DURATION: 6 weeks  PLANNED INTERVENTIONS: 97164- PT Re-evaluation, 97110-Therapeutic exercises, 97530- Therapeutic activity, [id number]- Neuromuscular re-education, 97535- Self Care, [id number]- Manual therapy, [id number]- Gait training, [phone]- Aquatic Therapy, [id number]- Electrical stimulation (unattended), [id number]- Electrical stimulation (manual), [id number]- Ionotophoresis 4mg /ml Dexamethasone , Dry Needling, Cryotherapy, and Moist heat  PLAN FOR NEXT SESSION: PT on hold until she returns from trip.   Reagen Haberman, PT, DPT, ATC 11/30/23 12:37 PM

## 2023-12-26 ENCOUNTER — Encounter: Payer: Self-pay | Admitting: Physical Therapy

## 2023-12-26 ENCOUNTER — Other Ambulatory Visit: Payer: Self-pay

## 2023-12-26 ENCOUNTER — Ambulatory Visit: Attending: Obstetrics & Gynecology | Admitting: Physical Therapy

## 2023-12-26 DIAGNOSIS — R279 Unspecified lack of coordination: Secondary | ICD-10-CM | POA: Insufficient documentation

## 2023-12-26 DIAGNOSIS — M6281 Muscle weakness (generalized): Secondary | ICD-10-CM | POA: Insufficient documentation

## 2023-12-26 DIAGNOSIS — R293 Abnormal posture: Secondary | ICD-10-CM | POA: Diagnosis not present

## 2023-12-26 DIAGNOSIS — R102 Pelvic and perineal pain: Secondary | ICD-10-CM | POA: Diagnosis not present

## 2023-12-26 NOTE — Therapy (Signed)
 OUTPATIENT PHYSICAL THERAPY FEMALE PELVIC EVALUATION   Patient Name: Becky Bowers MRN: [id number] DOB:05/23/1956, 68 y.o., female Today's Date: 12/26/2023  END OF SESSION:  PT End of Session - 12/26/23 1527     Visit Number 1    Number of Visits 10    Date for PT Re-Evaluation 06/27/24    Authorization Type [employer]    PT Start Time 0245    PT Stop Time 0330    PT Time Calculation (min) 45 min    Activity Tolerance Patient tolerated treatment well    Behavior During Therapy [facility] for tasks assessed/performed             Past Medical History:  Diagnosis Date   Arthritis    Concussion 09/12/2019   Past Surgical History:  Procedure Laterality Date   APPENDECTOMY  1968   DILATION AND CURETTAGE OF UTERUS  1990   TONSILLECTOMY  1980   Patient Active Problem List   Diagnosis Date Noted   Osteoporosis 10/11/2023   Right hip pain 08/17/2023   Greater trochanteric bursitis, right 05/31/2023   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 02/03/2023   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   Elevated LDL cholesterol level 04/14/2021   Polyarthralgia 03/17/2021   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, right trigger thumb 04/24/2018   Numbness of right third and fourth toes 03/19/2018   Primary osteoarthritis of both hips 12/08/2015   Headache 08/12/2010   Lumbar degenerative disc disease 01/07/2008    PCP: Cydney Draft, MD  REFERRING PROVIDER: Rik Chasten, MD  REFERRING DIAG: R10.2 (ICD-10-CM) - Pelvic pain  THERAPY DIAG:  Muscle weakness (generalized)  Unspecified lack of coordination  Abnormal posture  Rationale for Evaluation and Treatment: Rehabilitation  ONSET DATE: within the last few years   SUBJECTIVE:                                                                                                                                                                                            SUBJECTIVE STATEMENT: Patient reports that intercourse is painful and not enjoyable at all. This started a few years ago and she reports no use of lubrication at home. She started using estradiol  and this has helped some with the dryness.  Fluid intake: 64 oz water daily, 1 or 2 cups of coffee daily   PAIN:  Are you having pain? No NPRS scale: 0/10  PRECAUTIONS: None  RED FLAGS: None   WEIGHT BEARING RESTRICTIONS: No  FALLS:  Has patient fallen in last 6 months? No  OCCUPATION: retired   ACTIVITY LEVEL:  walks daily, takes pilates 1x/wk (1 hour)   PLOF: Independent  PATIENT GOALS: decrease pain with vaginal intercourse   BOWEL MOVEMENT: Pain with bowel movement: No Type of bowel movement:Type (Bristol Stool Scale) 3, Frequency 3-4x/wk, Strain yes, and Splinting yes Fully empty rectum: Yes:   Leakage: No Pads: No Fiber supplement/laxative No  URINATION: Pain with urination: No Fully empty bladder: Yes:   Stream: Strong Urgency: Yes  Frequency: within normal limits, 1x/night  Leakage: none Pads: No  INTERCOURSE:  Ability to have vaginal penetration Yes  Pain with intercourse: Initial Penetration, During Penetration, and After Intercourse Dryness: Yes  Marinoff Scale: 3/3  PREGNANCY: Vaginal deliveries 1   PROLAPSE: None  OBJECTIVE:  Note: Objective measures were completed at Evaluation unless otherwise noted.  PATIENT SURVEYS:   PFIQ-7: 88  COGNITION: Overall cognitive status: Within functional limits for tasks assessed     SENSATION: Light touch: Appears intact  LUMBAR SPECIAL TESTS:  Single leg stance test: Positive  FUNCTIONAL TESTS:  Squat: bilateral dynamic knee valgus with loading, no significant lumbopelvic stiffness present    GAIT: Assistive device utilized: None Comments: mild trendelenburg gait pattern with ambulation   POSTURE: rounded shoulders, forward head, and increased thoracic kyphosis   LUMBARAROM/PROM:  A/PROM  A/PROM  eval  Flexion Within normal limits   Extension 25% limited  Right lateral flexion Within normal limits   Left lateral flexion Within normal limits   Right rotation 25% limited   Left rotation 25% limited   (Blank rows = not tested)  LOWER EXTREMITY ROM: within functional limits   LOWER EXTREMITY MMT: 4/5 bilateral knees and hips grossly   PALPATION:   General: no significant tenderness to palpation of bilateral adductors or hip flexors   Pelvic Alignment: within normal limits   Abdominal: upper chest breathing and abdominal bracing at rest, decreased lower rib excursion with inhalation                 External Perineal Exam: mild dryness noted with sufficient clitoral hood mobility present                              Internal Pelvic Floor: patient demonstrates mild overactivity in left side of pelvic floor, both superficially and deep. She reports mild burning sensation with palpation of left levator ani group. Patient's pelvic floor is lacking active range of motion due to stiffness in the musculature. With inhalation, patient is able to actively lengthen her pelvic floor and shorten the musculature with exhalation. Following deep diaphragmatic breathing interventions, internal burning sensation decreased. No pain following today's examination.   Patient confirms identification and approves PT to assess internal pelvic floor and treatment Yes No emotional/communication barriers or cognitive limitation. Patient is motivated to learn. Patient understands and agrees with treatment goals and plan. PT explains patient will be examined in standing, sitting, and lying down to see how their muscles and joints work. When they are ready, they will be asked to remove their underwear so PT can examine their perineum. The patient is also given the option of providing their own chaperone as one is not provided in our facility. The patient also has the right and is explained the right to defer  or refuse any part of the evaluation or treatment including the internal exam. With the patient's consent, PT will use one gloved finger to gently assess the muscles of the pelvic floor, seeing how well it contracts and  relaxes and if there is muscle symmetry. After, the patient will get dressed and PT and patient will discuss exam findings and plan of care. PT and patient discuss plan of care, schedule, attendance policy and HEP activities.  PELVIC MMT:   MMT eval  Vaginal 5/5, 10 quick flicks, 10 second hold   Internal Anal Sphincter   External Anal Sphincter   Puborectalis   Diastasis Recti   (Blank rows = not tested)       TONE: Normal throughout superficial and deep pelvic floor   PROLAPSE: N/A  TODAY'S TREATMENT:                                                                                                                              DATE:   EVAL 12/26/23: Examination completed, findings reviewed, pt educated on POC, HEP, and self care. Pt motivated to participate in PT and agreeable to attempt recommendations.   Neuro re-ed: Hooklying diaphragmatic breathing + pelvic floor lengthening with inhalation and shortening with exhalation 3x10  Supine butterfly stretch + diaphragmatic breathing 2x24min  Childs pose + diaphragmatic breathing 2x72min  Self care:  Relative anatomy and the connection between the diaphragm and pelvic floor, how lubrication can affect pelvic pain during intercourse and vaginal moisture level education   PATIENT EDUCATION:  Education details: Relative anatomy and the connection between the diaphragm and pelvic floor, how lubrication can affect pelvic pain during intercourse and vaginal moisture level education  Person educated: Patient Education method: Explanation, Demonstration, Tactile cues, Verbal cues, and Handouts Education comprehension: verbalized understanding, returned demonstration, verbal cues required, tactile cues required, and needs further  education  HOME EXERCISE PROGRAM: Access Code: [email or web address] URL: [email or web address] Date: 12/26/2023 Prepared by: Robbin Chill  Exercises - Supine Pelvic Floor Contraction  - 1 x daily - 7 x weekly - 3 sets - 10 reps - Supine Butterfly Groin Stretch  - 1 x daily - 7 x weekly - 3 sets - hold - Child's Pose Stretch  - 1 x daily - 7 x weekly - 3 sets - hold  ASSESSMENT:  CLINICAL IMPRESSION: Patient is a 68 y.o. female  who was seen today for physical therapy evaluation and treatment for pelvic pain during intercourse. This pain has been present for a few years and patient reports no use of lubricant at home. Internal examination performed today and patient demonstrates mild overactivity in left side of pelvic floor, both superficially and deep. She reports mild burning sensation with palpation of left levator ani group. Patient's pelvic floor is lacking active range of motion due to stiffness in the musculature. With inhalation, patient is able to actively lengthen her pelvic floor and shorten the musculature with exhalation. Following deep diaphragmatic breathing interventions, internal burning sensation decreased. No pain following today's examination. Low-level pelvic floor active range of motion training introduced and gentle pelvic floor downtraining stretches provided to start relaxing the musculature around the pelvis. Overall, patient tolerated  session well and Pt would benefit from additional PT to further address deficits.    OBJECTIVE IMPAIRMENTS: decreased coordination, decreased endurance, decreased mobility, decreased ROM, decreased strength, and pain.   ACTIVITY LIMITATIONS: vaginal penetration during sexual intercourse  PARTICIPATION LIMITATIONS: pelvic examinations  PERSONAL FACTORS: Age, Past/current experiences, and Time since onset of injury/illness/exacerbation are also affecting patient's functional outcome.   REHAB POTENTIAL: Good  CLINICAL  DECISION MAKING: Stable/uncomplicated  EVALUATION COMPLEXITY: Low   GOALS: Goals reviewed with patient? Yes  SHORT TERM GOALS: Target date: 01/23/2024  Pt will be independent with HEP.  Baseline: Goal status: INITIAL  2.  Pt will be independent with diaphragmatic breathing and down training activities in order to improve pelvic floor relaxation. Baseline:  Goal status: INITIAL  3.  Pt will report 50% reduction of pain during vaginal penetration due to improvements in posture, strength, and muscle length to allow patient to participate in intercourse and gynecological visits to improve overall quality of life and intimate relationship with partner.  Baseline:  Goal status: INITIAL  LONG TERM GOALS: Target date: 06/27/2024   Pt will be independent with advanced HEP. Baseline:  Goal status: INITIAL  2.  Pt will report 0/10 pain with vaginal penetration in order to improve intimate relationship with partner and to allow patient to participate in medical examinations without pain being a limiting factor.  Baseline:  Goal status: INITIAL  3.  Pt to demonstrate improved coordination of pelvic floor and breathing mechanics with 10# squat with appropriate synergistic patterns to decrease pain at least 75% of the time for improved ability to complete a 30 minute workout with strain at pelvic floor and symptoms.    Baseline:  Goal status: INITIAL  4.  Pt will be independent with dilator program and progressions to decrease vaginal sensitization and return to pain free vaginal penetration to allow patient to participate in intercourse and gynecological examinations without pain. Baseline:  Goal status: INITIAL  PLAN:  PT FREQUENCY: 1-2x/week  PT DURATION: 6 months  PLANNED INTERVENTIONS: 97110-Therapeutic exercises, 97530- Therapeutic activity, 97112- Neuromuscular re-education, 97535- Self Care, [id number]- Manual therapy, Patient/Family education, Taping, Dry Needling, Joint  mobilization, Spinal mobilization, Scar mobilization, Cryotherapy, and Moist heat  PLAN FOR NEXT SESSION: internal vaginal treatment to decrease tension on left side of pelvic floor, introduce seated pelvic floor AROM and downtraining stretches for hips and glutes, trunk   Marni Sins, PT 12/26/2023, 4:29 PM

## 2023-12-27 ENCOUNTER — Encounter: Payer: Self-pay | Admitting: Sports Medicine

## 2023-12-27 ENCOUNTER — Ambulatory Visit (INDEPENDENT_AMBULATORY_CARE_PROVIDER_SITE_OTHER): Admitting: Sports Medicine

## 2023-12-27 DIAGNOSIS — M51362 Other intervertebral disc degeneration, lumbar region with discogenic back pain and lower extremity pain: Secondary | ICD-10-CM | POA: Diagnosis not present

## 2023-12-27 NOTE — Assessment & Plan Note (Signed)
 This is a very pleasant 68 year old female, long history of buttock and lateral thigh pain, mostly left-sided, worse with twisting, she has been treated extensively for ischiogluteal bursitis and proximal hamstring tendinosis, and MRI did show fairly severe spinal stenosis L4-L5, gabapentin  seem to help, she did well on her trip to [place]. She will go ahead increase her gabapentin  to 6 mg 3 times daily and return in 6 weeks, she can always message me for an epidural if she desires before the next appointment.

## 2023-12-27 NOTE — Progress Notes (Signed)
    Procedures performed today:    None.  Independent interpretation of notes and tests performed by another provider:   None.  Brief History, Exam, Impression, and Recommendations:    Lumbar degenerative disc disease This is a very pleasant 68 year old female, long history of buttock and lateral thigh pain, mostly left-sided, worse with twisting, she has been treated extensively for ischiogluteal bursitis and proximal hamstring tendinosis, and MRI did show fairly severe spinal stenosis L4-L5, gabapentin  seem to help, she did well on her trip to [place]. She will go ahead increase her gabapentin  to 6 mg 3 times daily and return in 6 weeks, she can always message me for an epidural if she desires before the next appointment.    ____________________________________________ Joselyn Nicely. Sandy Crumb, M.D., ABFM., CAQSM., AME. Primary Care and Sports Medicine [facility]  Adjunct Professor of [facility] Medicine  [employer]  Restaurant manager, fast food

## 2024-01-03 ENCOUNTER — Ambulatory Visit: Admitting: Physical Therapy

## 2024-01-03 DIAGNOSIS — R279 Unspecified lack of coordination: Secondary | ICD-10-CM | POA: Diagnosis not present

## 2024-01-03 DIAGNOSIS — R102 Pelvic and perineal pain: Secondary | ICD-10-CM | POA: Diagnosis not present

## 2024-01-03 DIAGNOSIS — M6281 Muscle weakness (generalized): Secondary | ICD-10-CM

## 2024-01-03 DIAGNOSIS — R293 Abnormal posture: Secondary | ICD-10-CM | POA: Diagnosis not present

## 2024-01-03 NOTE — Therapy (Signed)
 OUTPATIENT PHYSICAL THERAPY FEMALE PELVIC TREATMENT   Patient Name: Becky Bowers MRN: [id number] DOB:17-Mar-1956, 68 y.o., female Today's Date: 01/03/2024  END OF SESSION:  PT End of Session - 01/03/24 1127     Visit Number 2    Number of Visits 10    Date for PT Re-Evaluation 06/27/24    Authorization Type [employer]    PT Start Time 1100    PT Stop Time 1140    PT Time Calculation (min) 40 min    Activity Tolerance Patient tolerated treatment well    Behavior During Therapy [facility] for tasks assessed/performed              Past Medical History:  Diagnosis Date   Arthritis    Concussion 09/12/2019   Past Surgical History:  Procedure Laterality Date   APPENDECTOMY  1968   DILATION AND CURETTAGE OF UTERUS  1990   TONSILLECTOMY  1980   Patient Active Problem List   Diagnosis Date Noted   Osteoporosis 10/11/2023   Right hip pain 08/17/2023   Greater trochanteric bursitis, right 05/31/2023   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 02/03/2023   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   Elevated LDL cholesterol level 04/14/2021   Polyarthralgia 03/17/2021   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, right trigger thumb 04/24/2018   Numbness of right third and fourth toes 03/19/2018   Primary osteoarthritis of both hips 12/08/2015   Headache 08/12/2010   Lumbar degenerative disc disease 01/07/2008    PCP: Cydney Draft, MD  REFERRING PROVIDER: Rik Chasten, MD  REFERRING DIAG: R10.2 (ICD-10-CM) - Pelvic pain  THERAPY DIAG:  Muscle weakness (generalized)  Rationale for Evaluation and Treatment: Rehabilitation  ONSET DATE: within the last few years   SUBJECTIVE:                                                                                                                                                                                           SUBJECTIVE STATEMENT: Patient reports that she is doing  well today. She has done her homework exercises. She has not had intercourse since last visit. She is interested in a vaginal moisturizers.   From eval: Patient reports that intercourse is painful and not enjoyable at all. This started a few years ago and she reports no use of lubrication at home. She started using estradiol  and this has helped some with the dryness.  Fluid intake: 64 oz water daily, 1 or 2 cups of coffee daily   PAIN:  Are you having pain? No NPRS scale: 0/10  PRECAUTIONS: None  RED  FLAGS: None   WEIGHT BEARING RESTRICTIONS: No  FALLS:  Has patient fallen in last 6 months? No  OCCUPATION: retired   ACTIVITY LEVEL: walks daily, takes pilates 1x/wk (1 hour)   PLOF: Independent  PATIENT GOALS: decrease pain with vaginal intercourse   BOWEL MOVEMENT: Pain with bowel movement: No Type of bowel movement:Type (Bristol Stool Scale) 3, Frequency 3-4x/wk, Strain yes, and Splinting yes Fully empty rectum: Yes:   Leakage: No Pads: No Fiber supplement/laxative No  URINATION: Pain with urination: No Fully empty bladder: Yes:   Stream: Strong Urgency: Yes  Frequency: within normal limits, 1x/night  Leakage: none Pads: No  INTERCOURSE:  Ability to have vaginal penetration Yes  Pain with intercourse: Initial Penetration, During Penetration, and After Intercourse Dryness: Yes  Marinoff Scale: 3/3  PREGNANCY: Vaginal deliveries 1   PROLAPSE: None  OBJECTIVE:  Note: Objective measures were completed at Evaluation unless otherwise noted.  PATIENT SURVEYS:   PFIQ-7: 38  COGNITION: Overall cognitive status: Within functional limits for tasks assessed     SENSATION: Light touch: Appears intact  LUMBAR SPECIAL TESTS:  Single leg stance test: Positive  FUNCTIONAL TESTS:  Squat: bilateral dynamic knee valgus with loading, no significant lumbopelvic stiffness present    GAIT: Assistive device utilized: None Comments: mild trendelenburg gait pattern  with ambulation   POSTURE: rounded shoulders, forward head, and increased thoracic kyphosis   LUMBARAROM/PROM:  A/PROM A/PROM  eval  Flexion Within normal limits   Extension 25% limited  Right lateral flexion Within normal limits   Left lateral flexion Within normal limits   Right rotation 25% limited   Left rotation 25% limited   (Blank rows = not tested)  LOWER EXTREMITY ROM: within functional limits   LOWER EXTREMITY MMT: 4/5 bilateral knees and hips grossly   PALPATION:   General: no significant tenderness to palpation of bilateral adductors or hip flexors   Pelvic Alignment: within normal limits   Abdominal: upper chest breathing and abdominal bracing at rest, decreased lower rib excursion with inhalation                 External Perineal Exam: mild dryness noted with sufficient clitoral hood mobility present                              Internal Pelvic Floor: patient demonstrates mild overactivity in left side of pelvic floor, both superficially and deep. She reports mild burning sensation with palpation of left levator ani group. Patient's pelvic floor is lacking active range of motion due to stiffness in the musculature. With inhalation, patient is able to actively lengthen her pelvic floor and shorten the musculature with exhalation. Following deep diaphragmatic breathing interventions, internal burning sensation decreased. No pain following today's examination.   Patient confirms identification and approves PT to assess internal pelvic floor and treatment Yes No emotional/communication barriers or cognitive limitation. Patient is motivated to learn. Patient understands and agrees with treatment goals and plan. PT explains patient will be examined in standing, sitting, and lying down to see how their muscles and joints work. When they are ready, they will be asked to remove their underwear so PT can examine their perineum. The patient is also given the option of providing  their own chaperone as one is not provided in our facility. The patient also has the right and is explained the right to defer or refuse any part of the evaluation or treatment including the internal  exam. With the patient's consent, PT will use one gloved finger to gently assess the muscles of the pelvic floor, seeing how well it contracts and relaxes and if there is muscle symmetry. After, the patient will get dressed and PT and patient will discuss exam findings and plan of care. PT and patient discuss plan of care, schedule, attendance policy and HEP activities.  PELVIC MMT:   MMT eval  Vaginal 5/5, 10 quick flicks, 10 second hold   Internal Anal Sphincter   External Anal Sphincter   Puborectalis   Diastasis Recti   (Blank rows = not tested)       TONE: Normal throughout superficial and deep pelvic floor   PROLAPSE: N/A  TODAY'S TREATMENT:                                                                                                                              DATE:   EVAL 12/26/23: Examination completed, findings reviewed, pt educated on POC, HEP, and self care. Pt motivated to participate in PT and agreeable to attempt recommendations.   Neuro re-ed: Hooklying diaphragmatic breathing + pelvic floor lengthening with inhalation and shortening with exhalation 3x10  Supine butterfly stretch + diaphragmatic breathing 2x89min  Childs pose + diaphragmatic breathing 2x34min  Self care:  Relative anatomy and the connection between the diaphragm and pelvic floor, how lubrication can affect pelvic pain during intercourse and vaginal moisture level education   01/03/24: Neuro re-ed: Hooklying diaphragmatic breathing + pelvic floor lengthening with inhalation and shortening with exhalation 3x10  Supine butterfly stretch + diaphragmatic breathing 2x41min  Childs pose + diaphragmatic breathing 2x56min  Therapeutic exercise: Single knee to chest stretch + diaphragmatic breathing 2x48min  Pretzel  piriformis stretch + diaphragmatic breathing 2x36min  Adductor rock backs + diaphragmatic breathing 2x54min each side  Dynamic hamstring/hip flexor stretch + diaphragmatic breathing 2x2min  Self care:  Relative anatomy and the connection between the diaphragm and pelvic floor, how lubrication can affect pelvic pain during intercourse and vaginal moisture level education   PATIENT EDUCATION:  Education details: Relative anatomy and the connection between the diaphragm and pelvic floor, how lubrication can affect pelvic pain during intercourse and vaginal moisture level education  Person educated: Patient Education method: Explanation, Demonstration, Tactile cues, Verbal cues, and Handouts Education comprehension: verbalized understanding, returned demonstration, verbal cues required, tactile cues required, and needs further education  HOME EXERCISE PROGRAM: Access Code: [email or web address] URL: [email or web address] Date: 01/03/2024 Prepared by: Robbin Chill  Exercises - Supine Pelvic Floor Contraction  - 1 x daily - 7 x weekly - 2 sets - 10 reps - Supine Butterfly Groin Stretch  - 1 x daily - 7 x weekly - 2 sets - hold - Child's Pose Stretch  - 1 x daily - 7 x weekly - 2 sets - hold - Supine Single Knee to Chest Stretch  - 1 x daily - 7 x weekly - 2 sets - hold -  Supine Figure 4 Piriformis Stretch  - 1 x daily - 7 x weekly - 2 sets - hold - Kneeling Adductor Stretch with Hip External Rotation  - 1 x daily - 7 x weekly - 2 sets - hold - Half Kneel Dynamic Hamstrings and Contralateral Hip Flexor Stretch  - 1 x daily - 7 x weekly - 2 sets - hold  ASSESSMENT:  CLINICAL IMPRESSION: Patient is a 68 y.o. female  who was seen today for physical therapy treatment for pelvic pain during intercourse. Patient reports 0/10 pain today at rest and she hasn't had intercourse since last visit to test this. She has been consistent with HEP. She is curious about vaginal  moisturizers - she was provided with samples and education for this. Progressive downtraining stretches introduced today with emphasis on diaphragmatic breathing management for pelvic floor relaxation, which felt good to the patient. Overall, patient tolerated session well and Pt would benefit from additional PT to further address deficits.    OBJECTIVE IMPAIRMENTS: decreased coordination, decreased endurance, decreased mobility, decreased ROM, decreased strength, and pain.   ACTIVITY LIMITATIONS: vaginal penetration during sexual intercourse  PARTICIPATION LIMITATIONS: pelvic examinations  PERSONAL FACTORS: Age, Past/current experiences, and Time since onset of injury/illness/exacerbation are also affecting patient's functional outcome.   REHAB POTENTIAL: Good  CLINICAL DECISION MAKING: Stable/uncomplicated  EVALUATION COMPLEXITY: Low   GOALS: Goals reviewed with patient? Yes  SHORT TERM GOALS: Target date: 01/23/2024  Pt will be independent with HEP.  Baseline: Goal status: INITIAL  2.  Pt will be independent with diaphragmatic breathing and down training activities in order to improve pelvic floor relaxation. Baseline:  Goal status: INITIAL  3.  Pt will report 50% reduction of pain during vaginal penetration due to improvements in posture, strength, and muscle length to allow patient to participate in intercourse and gynecological visits to improve overall quality of life and intimate relationship with partner.  Baseline:  Goal status: INITIAL  LONG TERM GOALS: Target date: 06/27/2024   Pt will be independent with advanced HEP. Baseline:  Goal status: INITIAL  2.  Pt will report 0/10 pain with vaginal penetration in order to improve intimate relationship with partner and to allow patient to participate in medical examinations without pain being a limiting factor.  Baseline:  Goal status: INITIAL  3.  Pt to demonstrate improved coordination of pelvic floor and breathing  mechanics with 10# squat with appropriate synergistic patterns to decrease pain at least 75% of the time for improved ability to complete a 30 minute workout with strain at pelvic floor and symptoms.    Baseline:  Goal status: INITIAL  4.  Pt will be independent with dilator program and progressions to decrease vaginal sensitization and return to pain free vaginal penetration to allow patient to participate in intercourse and gynecological examinations without pain. Baseline:  Goal status: INITIAL  PLAN:  PT FREQUENCY: 1-2x/week  PT DURATION: 6 months  PLANNED INTERVENTIONS: 97110-Therapeutic exercises, 97530- Therapeutic activity, 97112- Neuromuscular re-education, 97535- Self Care, [id number]- Manual therapy, Patient/Family education, Taping, Dry Needling, Joint mobilization, Spinal mobilization, Scar mobilization, Cryotherapy, and Moist heat  PLAN FOR NEXT SESSION: internal vaginal treatment to decrease tension on left side of pelvic floor, introduce seated pelvic floor AROM and downtraining stretches for hips and glutes, trunk   Marni Sins, PT 01/03/2024, 11:28 AM

## 2024-01-10 ENCOUNTER — Ambulatory Visit: Payer: Self-pay | Attending: Obstetrics & Gynecology | Admitting: Physical Therapy

## 2024-01-10 DIAGNOSIS — R293 Abnormal posture: Secondary | ICD-10-CM | POA: Insufficient documentation

## 2024-01-10 DIAGNOSIS — R279 Unspecified lack of coordination: Secondary | ICD-10-CM | POA: Diagnosis not present

## 2024-01-10 DIAGNOSIS — M6281 Muscle weakness (generalized): Secondary | ICD-10-CM | POA: Insufficient documentation

## 2024-01-10 NOTE — Therapy (Signed)
 OUTPATIENT PHYSICAL THERAPY FEMALE PELVIC TREATMENT   Patient Name: MACLOVIA UHER MRN: [id number] DOB:08-16-1955, 68 y.o., female Today's Date: 01/10/2024  END OF SESSION:  PT End of Session - 01/10/24 1526     Visit Number 3    Number of Visits 10    Date for PT Re-Evaluation 06/27/24    Authorization Type [employer]    PT Start Time 0245    PT Stop Time 0330    PT Time Calculation (min) 45 min    Activity Tolerance Patient tolerated treatment well    Behavior During Therapy [facility] for tasks assessed/performed               Past Medical History:  Diagnosis Date   Arthritis    Concussion 09/12/2019   Past Surgical History:  Procedure Laterality Date   APPENDECTOMY  1968   DILATION AND CURETTAGE OF UTERUS  1990   TONSILLECTOMY  1980   Patient Active Problem List   Diagnosis Date Noted   Osteoporosis 10/11/2023   Right hip pain 08/17/2023   Greater trochanteric bursitis, right 05/31/2023   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 02/03/2023   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   Elevated LDL cholesterol level 04/14/2021   Polyarthralgia 03/17/2021   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, right trigger thumb 04/24/2018   Numbness of right third and fourth toes 03/19/2018   Primary osteoarthritis of both hips 12/08/2015   Headache 08/12/2010   Lumbar degenerative disc disease 01/07/2008    PCP: Cydney Draft, MD  REFERRING PROVIDER: Rik Chasten, MD  REFERRING DIAG: R10.2 (ICD-10-CM) - Pelvic pain  THERAPY DIAG:  Muscle weakness (generalized)  Rationale for Evaluation and Treatment: Rehabilitation  ONSET DATE: within the last few years   SUBJECTIVE:                                                                                                                                                                                           SUBJECTIVE STATEMENT: Patient reports that she has  been doing some yard work redoing her [employer]eronautical [employer]. She is feeling sore from all of this in her wrists and hamstring. No other pain to report. She had intercourse after last session and it wasn't painful but it wasn't great. Even the next day, she had no pain.  From eval: Patient reports that intercourse is painful and not enjoyable at all. This started a few years ago and she reports no use of lubrication at home. She started using estradiol  and this has helped some with the dryness.  Fluid intake: 64 oz water daily,  1 or 2 cups of coffee daily   PAIN:  Are you having pain? No NPRS scale: 0/10  PRECAUTIONS: None  RED FLAGS: None   WEIGHT BEARING RESTRICTIONS: No  FALLS:  Has patient fallen in last 6 months? No  OCCUPATION: retired   ACTIVITY LEVEL: walks daily, takes pilates 1x/wk (1 hour)   PLOF: Independent  PATIENT GOALS: decrease pain with vaginal intercourse   BOWEL MOVEMENT: Pain with bowel movement: No Type of bowel movement:Type (Bristol Stool Scale) 3, Frequency 3-4x/wk, Strain yes, and Splinting yes Fully empty rectum: Yes:   Leakage: No Pads: No Fiber supplement/laxative No  URINATION: Pain with urination: No Fully empty bladder: Yes:   Stream: Strong Urgency: Yes  Frequency: within normal limits, 1x/night  Leakage: none Pads: No  INTERCOURSE:  Ability to have vaginal penetration Yes  Pain with intercourse: Initial Penetration, During Penetration, and After Intercourse Dryness: Yes  Marinoff Scale: 3/3  PREGNANCY: Vaginal deliveries 1   PROLAPSE: None  OBJECTIVE:  Note: Objective measures were completed at Evaluation unless otherwise noted.  PATIENT SURVEYS:   PFIQ-7: 31  COGNITION: Overall cognitive status: Within functional limits for tasks assessed     SENSATION: Light touch: Appears intact  LUMBAR SPECIAL TESTS:  Single leg stance test: Positive  FUNCTIONAL TESTS:  Squat: bilateral dynamic knee valgus with loading, no significant  lumbopelvic stiffness present    GAIT: Assistive device utilized: None Comments: mild trendelenburg gait pattern with ambulation   POSTURE: rounded shoulders, forward head, and increased thoracic kyphosis   LUMBARAROM/PROM:  A/PROM A/PROM  eval  Flexion Within normal limits   Extension 25% limited  Right lateral flexion Within normal limits   Left lateral flexion Within normal limits   Right rotation 25% limited   Left rotation 25% limited   (Blank rows = not tested)  LOWER EXTREMITY ROM: within functional limits   LOWER EXTREMITY MMT: 4/5 bilateral knees and hips grossly   PALPATION:   General: no significant tenderness to palpation of bilateral adductors or hip flexors   Pelvic Alignment: within normal limits   Abdominal: upper chest breathing and abdominal bracing at rest, decreased lower rib excursion with inhalation                 External Perineal Exam: mild dryness noted with sufficient clitoral hood mobility present                              Internal Pelvic Floor: patient demonstrates mild overactivity in left side of pelvic floor, both superficially and deep. She reports mild burning sensation with palpation of left levator ani group. Patient's pelvic floor is lacking active range of motion due to stiffness in the musculature. With inhalation, patient is able to actively lengthen her pelvic floor and shorten the musculature with exhalation. Following deep diaphragmatic breathing interventions, internal burning sensation decreased. No pain following today's examination.   Patient confirms identification and approves PT to assess internal pelvic floor and treatment Yes No emotional/communication barriers or cognitive limitation. Patient is motivated to learn. Patient understands and agrees with treatment goals and plan. PT explains patient will be examined in standing, sitting, and lying down to see how their muscles and joints work. When they are ready, they will be  asked to remove their underwear so PT can examine their perineum. The patient is also given the option of providing their own chaperone as one is not provided in our facility. The  patient also has the right and is explained the right to defer or refuse any part of the evaluation or treatment including the internal exam. With the patient's consent, PT will use one gloved finger to gently assess the muscles of the pelvic floor, seeing how well it contracts and relaxes and if there is muscle symmetry. After, the patient will get dressed and PT and patient will discuss exam findings and plan of care. PT and patient discuss plan of care, schedule, attendance policy and HEP activities.  PELVIC MMT:   MMT eval  Vaginal 5/5, 10 quick flicks, 10 second hold   Internal Anal Sphincter   External Anal Sphincter   Puborectalis   Diastasis Recti   (Blank rows = not tested)       TONE: Normal throughout superficial and deep pelvic floor   PROLAPSE: N/A  TODAY'S TREATMENT:                                                                                                                              DATE:   EVAL 12/26/23: Examination completed, findings reviewed, pt educated on POC, HEP, and self care. Pt motivated to participate in PT and agreeable to attempt recommendations.   Neuro re-ed: Hooklying diaphragmatic breathing + pelvic floor lengthening with inhalation and shortening with exhalation 3x10  Supine butterfly stretch + diaphragmatic breathing 2x25min  Childs pose + diaphragmatic breathing 2x74min  Self care:  Relative anatomy and the connection between the diaphragm and pelvic floor, how lubrication can affect pelvic pain during intercourse and vaginal moisture level education   01/03/24: Neuro re-ed: Hooklying diaphragmatic breathing + pelvic floor lengthening with inhalation and shortening with exhalation 3x10  Supine butterfly stretch + diaphragmatic breathing 2x66min  Childs pose +  diaphragmatic breathing 2x16min  Therapeutic exercise: Single knee to chest stretch + diaphragmatic breathing 2x69min  Pretzel piriformis stretch + diaphragmatic breathing 2x31min  Adductor rock backs + diaphragmatic breathing 2x63min each side  Dynamic hamstring/hip flexor stretch + diaphragmatic breathing 2x39min  Self care:  Relative anatomy and the connection between the diaphragm and pelvic floor, how lubrication can affect pelvic pain during intercourse and vaginal moisture level education   01/10/24: Neuro re-ed: Hooklying diaphragmatic breathing + pelvic floor lengthening with inhalation and shortening with exhalation 3x10  Sit to stand + ball squeeze + diaphragmatic breathing 2x10  Sidelying clamshell + reverse clamshell + diaphragmatic breathing 2x10 each side  Bridge + adductor ball squeeze + diaphragmatic breathing 2x10  Therapeutic exercise: Single knee to chest stretch + diaphragmatic breathing 2x52min  Pretzel piriformis stretch + diaphragmatic breathing 2x62min  Adductor rock backs + diaphragmatic breathing 2x43min each side  Dynamic hamstring/hip flexor stretch + diaphragmatic breathing 2x3min  Supine butterfly stretch + diaphragmatic breathing 2x6min  Childs pose + diaphragmatic breathing 2x64min  Self care:  Relative anatomy and the connection between the diaphragm and pelvic floor, how lubrication can affect pelvic pain during intercourse and vaginal moisture level education  Manual therapy: Attaday use on right hamstring and use of massage ball/lacrosse ball for STM of proximal right hamstring   PATIENT EDUCATION:  Education details: Relative anatomy and the connection between the diaphragm and pelvic floor, how lubrication can affect pelvic pain during intercourse and vaginal moisture level education  Person educated: Patient Education method: Explanation, Demonstration, Tactile cues, Verbal cues, and Handouts Education comprehension: verbalized understanding, returned  demonstration, verbal cues required, tactile cues required, and needs further education  HOME EXERCISE PROGRAM: Access Code: [email or web address] URL: [email or web address] Date: 01/10/2024 Prepared by: Robbin Chill  Exercises - Seated Pelvic Floor Contraction  - 1 x daily - 7 x weekly - 2 sets - 10 reps - Sit to Stand with Celeste Cola Between Knees  - 1 x daily - 7 x weekly - 2 sets - 10 reps - Clamshell  - 1 x daily - 7 x weekly - 2 sets - 10 reps - Sidelying Reverse Clamshell  - 1 x daily - 7 x weekly - 2 sets - 10 reps - Supine Bridge with Mini Swiss Ball Between Knees  - 1 x daily - 7 x weekly - 2 sets - 10 reps - Supine Butterfly Groin Stretch  - 1 x daily - 7 x weekly - 2 sets - hold - Child's Pose Stretch  - 1 x daily - 7 x weekly - 2 sets - hold - Supine Figure 4 Piriformis Stretch  - 1 x daily - 7 x weekly - 2 sets - hold - Kneeling Adductor Stretch with Hip External Rotation  - 1 x daily - 7 x weekly - 2 sets - hold - Half Kneel Dynamic Hamstrings and Contralateral Hip Flexor Stretch  - 1 x daily - 7 x weekly - 2 sets - hold  ASSESSMENT:  CLINICAL IMPRESSION: Patient is a 68 y.o. female  who was seen today for physical therapy treatment for pelvic pain during intercourse. Patient reports 0/10 pain today at rest and she had intercourse since last visit with no pain to report, which she was pleased with. She has been consistent with HEP. Progressive downtraining stretches reviewed today with emphasis on diaphragmatic breathing management for pelvic floor relaxation, which felt good to the patient. Introduction to hip and core strengthening went very well and patient had no increase in pelvic pain or hamstring pain with these. Overall, patient tolerated session well and Pt would benefit from additional PT to further address deficits.    OBJECTIVE IMPAIRMENTS: decreased coordination, decreased endurance, decreased mobility, decreased ROM, decreased strength, and  pain.   ACTIVITY LIMITATIONS: vaginal penetration during sexual intercourse  PARTICIPATION LIMITATIONS: pelvic examinations  PERSONAL FACTORS: Age, Past/current experiences, and Time since onset of injury/illness/exacerbation are also affecting patient's functional outcome.   REHAB POTENTIAL: Good  CLINICAL DECISION MAKING: Stable/uncomplicated  EVALUATION COMPLEXITY: Low   GOALS: Goals reviewed with patient? Yes  SHORT TERM GOALS: Target date: 01/23/2024  Pt will be independent with HEP.  Baseline: Goal status: INITIAL  2.  Pt will be independent with diaphragmatic breathing and down training activities in order to improve pelvic floor relaxation. Baseline:  Goal status: INITIAL  3.  Pt will report 50% reduction of pain during vaginal penetration due to improvements in posture, strength, and muscle length to allow patient to participate in intercourse and gynecological visits to improve overall quality of life and intimate relationship with partner.  Baseline:  Goal status: INITIAL  LONG TERM GOALS: Target date: 06/27/2024   Pt will  be independent with advanced HEP. Baseline:  Goal status: INITIAL  2.  Pt will report 0/10 pain with vaginal penetration in order to improve intimate relationship with partner and to allow patient to participate in medical examinations without pain being a limiting factor.  Baseline:  Goal status: INITIAL  3.  Pt to demonstrate improved coordination of pelvic floor and breathing mechanics with 10# squat with appropriate synergistic patterns to decrease pain at least 75% of the time for improved ability to complete a 30 minute workout with strain at pelvic floor and symptoms.    Baseline:  Goal status: INITIAL  4.  Pt will be independent with dilator program and progressions to decrease vaginal sensitization and return to pain free vaginal penetration to allow patient to participate in intercourse and gynecological examinations without  pain. Baseline:  Goal status: INITIAL  PLAN:  PT FREQUENCY: 1-2x/week  PT DURATION: 6 months  PLANNED INTERVENTIONS: 97110-Therapeutic exercises, 97530- Therapeutic activity, 97112- Neuromuscular re-education, 97535- Self Care, [id number]- Manual therapy, Patient/Family education, Taping, Dry Needling, Joint mobilization, Spinal mobilization, Scar mobilization, Cryotherapy, and Moist heat  PLAN FOR NEXT SESSION: internal vaginal treatment to decrease tension on left side of pelvic floor, introduce seated pelvic floor AROM and downtraining stretches for hips and glutes, trunk   Marni Sins, PT 01/10/2024, 3:27 PM

## 2024-01-15 ENCOUNTER — Ambulatory Visit: Admitting: Obstetrics & Gynecology

## 2024-01-15 VITALS — BP 116/72 | HR 60 | Ht 63.0 in | Wt 129.0 lb

## 2024-01-15 DIAGNOSIS — N94819 Vulvodynia, unspecified: Secondary | ICD-10-CM | POA: Diagnosis not present

## 2024-01-15 MED ORDER — LIDOCAINE 5 % EX OINT: 1.0000 | TOPICAL_OINTMENT | CUTANEOUS | 1 refills | Status: DC | PRN

## 2024-01-15 NOTE — Progress Notes (Signed)
 Follow up appt with Dr. Marce Sensing. Found vaginal infection last visit. Improved. Referred to pelvic floor PT. Improved as well.

## 2024-01-15 NOTE — Progress Notes (Signed)
   Subjective:    Patient ID: Becky Bowers, female    DOB: 12-29-1955, 68 y.o.   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  Patient has been going to physical therapy and has had 3 visits.  Patient had intercourse recently with no pain during intercourse nor the day after.  Patient has been using lubricants at intercourse and will continue to do so.  Patient also uses vaginal estrogen twice a week.  She still exhibits some fear of dyspareunia which keeps her from enjoying intercourse.  Her and her husband have not had couples counseling or individual counseling to address the complex psychological symptoms that can occur with dyspareunia.    Review of Systems  Constitutional: Negative.   Respiratory: Negative.    Cardiovascular: Negative.   Gastrointestinal: Negative.   Genitourinary:        Some pain when vaginal estrogen is inserted.       Objective:   Physical Exam Vitals reviewed.  Constitutional:      General: She is not in acute distress.    Appearance: She is well-developed.  HENT:     Head: Normocephalic and atraumatic.  Eyes:     Conjunctiva/sclera: Conjunctivae normal.  Cardiovascular:     Rate and Rhythm: Normal rate.  Pulmonary:     Effort: Pulmonary effort is normal.  Genitourinary:   Skin:    General: Skin is warm and dry.  Neurological:     Mental Status: She is alert and oriented to person, place, and time.  Psychiatric:        Mood and Affect: Mood normal.    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  Patient was given a sample of Premarin cream to see if this helped.

## 2024-01-16 ENCOUNTER — Ambulatory Visit: Payer: Self-pay | Admitting: Physical Therapy

## 2024-01-16 DIAGNOSIS — M6281 Muscle weakness (generalized): Secondary | ICD-10-CM

## 2024-01-16 DIAGNOSIS — R293 Abnormal posture: Secondary | ICD-10-CM | POA: Diagnosis not present

## 2024-01-16 DIAGNOSIS — R279 Unspecified lack of coordination: Secondary | ICD-10-CM

## 2024-01-16 NOTE — Therapy (Signed)
 OUTPATIENT PHYSICAL THERAPY FEMALE PELVIC TREATMENT   Patient Name: Becky Bowers MRN: [id number] DOB:Jun 09, 1956, 68 y.o., female Today's Date: 01/16/2024  END OF SESSION:  PT End of Session - 01/16/24 0844     Visit Number 4    Number of Visits 10    Date for PT Re-Evaluation 06/27/24    Authorization Type [employer]    PT Start Time 0800    PT Stop Time 0845    PT Time Calculation (min) 45 min    Activity Tolerance Patient tolerated treatment well    Behavior During Therapy [facility] for tasks assessed/performed                Past Medical History:  Diagnosis Date   Arthritis    Concussion 09/12/2019   Past Surgical History:  Procedure Laterality Date   APPENDECTOMY  1968   DILATION AND CURETTAGE OF UTERUS  1990   TONSILLECTOMY  1980   Patient Active Problem List   Diagnosis Date Noted   Osteoporosis 10/11/2023   Right hip pain 08/17/2023   Greater trochanteric bursitis, right 05/31/2023   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 02/03/2023   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   Elevated LDL cholesterol level 04/14/2021   Polyarthralgia 03/17/2021   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, right trigger thumb 04/24/2018   Numbness of right third and fourth toes 03/19/2018   Primary osteoarthritis of both hips 12/08/2015   Headache 08/12/2010   Lumbar degenerative disc disease 01/07/2008    PCP: Cydney Draft, MD  REFERRING PROVIDER: Rik Chasten, MD  REFERRING DIAG: R10.2 (ICD-10-CM) - Pelvic pain  THERAPY DIAG:  Muscle weakness (generalized)  Unspecified lack of coordination  Abnormal posture  Rationale for Evaluation and Treatment: Rehabilitation  ONSET DATE: within the last few years   SUBJECTIVE:                                                                                                                                                                                            SUBJECTIVE STATEMENT: Patient reports that she saw her OBGYN yesterday and she was given topical lidocaine  for before and after intercourse. She was also given a topical estrogen sample to try. No intercourse since last visit. No pelvic pain outside of intercourse in the pelvis in the past week. Pelvic pain exam was painful yesterday.   From eval: Patient reports that intercourse is painful and not enjoyable at all. This started a few years ago and she reports no use of lubrication at home. She started using estradiol  and this has helped some  with the dryness.  Fluid intake: 64 oz water daily, 1 or 2 cups of coffee daily   PAIN:  Are you having pain? No NPRS scale: 0/10  PRECAUTIONS: None  RED FLAGS: None   WEIGHT BEARING RESTRICTIONS: No  FALLS:  Has patient fallen in last 6 months? No  OCCUPATION: retired   ACTIVITY LEVEL: walks daily, takes pilates 1x/wk (1 hour)   PLOF: Independent  PATIENT GOALS: decrease pain with vaginal intercourse   BOWEL MOVEMENT: Pain with bowel movement: No Type of bowel movement:Type (Bristol Stool Scale) 3, Frequency 3-4x/wk, Strain yes, and Splinting yes Fully empty rectum: Yes:   Leakage: No Pads: No Fiber supplement/laxative No  URINATION: Pain with urination: No Fully empty bladder: Yes:   Stream: Strong Urgency: Yes  Frequency: within normal limits, 1x/night  Leakage: none Pads: No  INTERCOURSE:  Ability to have vaginal penetration Yes  Pain with intercourse: Initial Penetration, During Penetration, and After Intercourse Dryness: Yes  Marinoff Scale: 3/3  PREGNANCY: Vaginal deliveries 1   PROLAPSE: None  OBJECTIVE:  Note: Objective measures were completed at Evaluation unless otherwise noted.  PATIENT SURVEYS:   PFIQ-7: 21  COGNITION: Overall cognitive status: Within functional limits for tasks assessed     SENSATION: Light touch: Appears intact  LUMBAR SPECIAL TESTS:  Single leg stance test:  Positive  FUNCTIONAL TESTS:  Squat: bilateral dynamic knee valgus with loading, no significant lumbopelvic stiffness present    GAIT: Assistive device utilized: None Comments: mild trendelenburg gait pattern with ambulation   POSTURE: rounded shoulders, forward head, and increased thoracic kyphosis   LUMBARAROM/PROM:  A/PROM A/PROM  eval  Flexion Within normal limits   Extension 25% limited  Right lateral flexion Within normal limits   Left lateral flexion Within normal limits   Right rotation 25% limited   Left rotation 25% limited   (Blank rows = not tested)  LOWER EXTREMITY ROM: within functional limits   LOWER EXTREMITY MMT: 4/5 bilateral knees and hips grossly   PALPATION:   General: no significant tenderness to palpation of bilateral adductors or hip flexors   Pelvic Alignment: within normal limits   Abdominal: upper chest breathing and abdominal bracing at rest, decreased lower rib excursion with inhalation                 External Perineal Exam: mild dryness noted with sufficient clitoral hood mobility present                              Internal Pelvic Floor: patient demonstrates mild overactivity in left side of pelvic floor, both superficially and deep. She reports mild burning sensation with palpation of left levator ani group. Patient's pelvic floor is lacking active range of motion due to stiffness in the musculature. With inhalation, patient is able to actively lengthen her pelvic floor and shorten the musculature with exhalation. Following deep diaphragmatic breathing interventions, internal burning sensation decreased. No pain following today's examination.   Patient confirms identification and approves PT to assess internal pelvic floor and treatment Yes No emotional/communication barriers or cognitive limitation. Patient is motivated to learn. Patient understands and agrees with treatment goals and plan. PT explains patient will be examined in standing,  sitting, and lying down to see how their muscles and joints work. When they are ready, they will be asked to remove their underwear so PT can examine their perineum. The patient is also given the option of providing their own  chaperone as one is not provided in our facility. The patient also has the right and is explained the right to defer or refuse any part of the evaluation or treatment including the internal exam. With the patient's consent, PT will use one gloved finger to gently assess the muscles of the pelvic floor, seeing how well it contracts and relaxes and if there is muscle symmetry. After, the patient will get dressed and PT and patient will discuss exam findings and plan of care. PT and patient discuss plan of care, schedule, attendance policy and HEP activities.  PELVIC MMT:   MMT eval  Vaginal 5/5, 10 quick flicks, 10 second hold   Internal Anal Sphincter   External Anal Sphincter   Puborectalis   Diastasis Recti   (Blank rows = not tested)       TONE: Normal throughout superficial and deep pelvic floor   PROLAPSE: N/A  TODAY'S TREATMENT:                                                                                                                              DATE:   01/03/24: Neuro re-ed: Hooklying diaphragmatic breathing + pelvic floor lengthening with inhalation and shortening with exhalation 3x10  Supine butterfly stretch + diaphragmatic breathing 2x42min  Childs pose + diaphragmatic breathing 2x70min  Therapeutic exercise: Single knee to chest stretch + diaphragmatic breathing 2x23min  Pretzel piriformis stretch + diaphragmatic breathing 2x63min  Adductor rock backs + diaphragmatic breathing 2x66min each side  Dynamic hamstring/hip flexor stretch + diaphragmatic breathing 2x34min  Self care:  Relative anatomy and the connection between the diaphragm and pelvic floor, how lubrication can affect pelvic pain during intercourse and vaginal moisture level education    01/10/24: Neuro re-ed: Hooklying diaphragmatic breathing + pelvic floor lengthening with inhalation and shortening with exhalation 3x10  Sit to stand + ball squeeze + diaphragmatic breathing 2x10  Sidelying clamshell + reverse clamshell + diaphragmatic breathing 2x10 each side  Bridge + adductor ball squeeze + diaphragmatic breathing 2x10  Therapeutic exercise: Single knee to chest stretch + diaphragmatic breathing 2x78min  Pretzel piriformis stretch + diaphragmatic breathing 2x30min  Adductor rock backs + diaphragmatic breathing 2x62min each side  Dynamic hamstring/hip flexor stretch + diaphragmatic breathing 2x76min  Supine butterfly stretch + diaphragmatic breathing 2x80min  Childs pose + diaphragmatic breathing 2x10min  Self care:  Relative anatomy and the connection between the diaphragm and pelvic floor, how lubrication can affect pelvic pain during intercourse and vaginal moisture level education  Manual therapy: Attaday use on right hamstring and use of massage ball/lacrosse ball for STM of proximal right hamstring   01/16/24:  Neuro re-ed: Vibration plate: standing 2x34min, hamstring stretch each  Hooklying diaphragmatic breathing + pelvic floor lengthening with inhalation and shortening with exhalation 3x10  Therapeutic exercise: Single knee to chest stretch + diaphragmatic breathing 2x71min  Pretzel piriformis stretch + diaphragmatic breathing 2x7min  Adductor rock backs + diaphragmatic breathing 2x25min each side  Dynamic hamstring/hip flexor stretch + diaphragmatic breathing 2x74min  Supine butterfly stretch + diaphragmatic breathing 2x35min  Childs pose + diaphragmatic breathing 2x27min  Manual therapy: Internal introital stretching at pelvic floor - vaginally Superficial pelvic floor downtraining and manual therapy to decrease tension at introitus   PATIENT EDUCATION:  Education details: Relative anatomy and the connection between the diaphragm and pelvic floor, how  lubrication can affect pelvic pain during intercourse and vaginal moisture level education  Person educated: Patient Education method: Explanation, Demonstration, Tactile cues, Verbal cues, and Handouts Education comprehension: verbalized understanding, returned demonstration, verbal cues required, tactile cues required, and needs further education  HOME EXERCISE PROGRAM: Access Code: [email or web address] URL: [email or web address] Date: 01/10/2024 Prepared by: Robbin Chill  Exercises - Seated Pelvic Floor Contraction  - 1 x daily - 7 x weekly - 2 sets - 10 reps - Sit to Stand with Celeste Cola Between Knees  - 1 x daily - 7 x weekly - 2 sets - 10 reps - Clamshell  - 1 x daily - 7 x weekly - 2 sets - 10 reps - Sidelying Reverse Clamshell  - 1 x daily - 7 x weekly - 2 sets - 10 reps - Supine Bridge with Mini Swiss Ball Between Knees  - 1 x daily - 7 x weekly - 2 sets - 10 reps - Supine Butterfly Groin Stretch  - 1 x daily - 7 x weekly - 2 sets - hold - Child's Pose Stretch  - 1 x daily - 7 x weekly - 2 sets - hold - Supine Figure 4 Piriformis Stretch  - 1 x daily - 7 x weekly - 2 sets - hold - Kneeling Adductor Stretch with Hip External Rotation  - 1 x daily - 7 x weekly - 2 sets - hold - Half Kneel Dynamic Hamstrings and Contralateral Hip Flexor Stretch  - 1 x daily - 7 x weekly - 2 sets - hold  ASSESSMENT:  CLINICAL IMPRESSION: Patient is a 68 y.o. female  who was seen today for physical therapy treatment for pelvic pain during intercourse. Patient reports 0/10 pain today at rest but she went to her OBGYN yesterday for a check up and she had pain with examination. Patient was provided with topical estrogen sample and topical lidocaine  for before and after intercourse. Internal treatment provided today at superficial pelvic floor musculature to decrease introital pain/tension at rest. Patient was tender to this palpation, but with internal stretching and deep diaphragmatic  breathing from patient, this pain decreased and she had none at end of session. Dilator discussion was had today and patient is open to using these in the future. For now, patient plans to try the combination of estrogen, lidocaine , and pelvic PT interventions to see if she can manage pain during intercourse. Overall, patient tolerated session well and Pt would benefit from additional PT to further address deficits.    OBJECTIVE IMPAIRMENTS: decreased coordination, decreased endurance, decreased mobility, decreased ROM, decreased strength, and pain.   ACTIVITY LIMITATIONS: vaginal penetration during sexual intercourse  PARTICIPATION LIMITATIONS: pelvic examinations  PERSONAL FACTORS: Age, Past/current experiences, and Time since onset of injury/illness/exacerbation are also affecting patient's functional outcome.   REHAB POTENTIAL: Good  CLINICAL DECISION MAKING: Stable/uncomplicated  EVALUATION COMPLEXITY: Low   GOALS: Goals reviewed with patient? Yes  SHORT TERM GOALS: Target date: 01/23/2024  Pt will be independent with HEP.  Baseline: Goal status: INITIAL  2.  Pt will be independent with diaphragmatic breathing and down  training activities in order to improve pelvic floor relaxation. Baseline:  Goal status: INITIAL  3.  Pt will report 50% reduction of pain during vaginal penetration due to improvements in posture, strength, and muscle length to allow patient to participate in intercourse and gynecological visits to improve overall quality of life and intimate relationship with partner.  Baseline:  Goal status: INITIAL  LONG TERM GOALS: Target date: 06/27/2024   Pt will be independent with advanced HEP. Baseline:  Goal status: INITIAL  2.  Pt will report 0/10 pain with vaginal penetration in order to improve intimate relationship with partner and to allow patient to participate in medical examinations without pain being a limiting factor.  Baseline:  Goal status:  INITIAL  3.  Pt to demonstrate improved coordination of pelvic floor and breathing mechanics with 10# squat with appropriate synergistic patterns to decrease pain at least 75% of the time for improved ability to complete a 30 minute workout with strain at pelvic floor and symptoms.    Baseline:  Goal status: INITIAL  4.  Pt will be independent with dilator program and progressions to decrease vaginal sensitization and return to pain free vaginal penetration to allow patient to participate in intercourse and gynecological examinations without pain. Baseline:  Goal status: INITIAL  PLAN:  PT FREQUENCY: 1-2x/week  PT DURATION: 6 months  PLANNED INTERVENTIONS: 97110-Therapeutic exercises, 97530- Therapeutic activity, 97112- Neuromuscular re-education, 97535- Self Care, [id number]- Manual therapy, Patient/Family education, Taping, Dry Needling, Joint mobilization, Spinal mobilization, Scar mobilization, Cryotherapy, and Moist heat  PLAN FOR NEXT SESSION: internal vaginal treatment to decrease tension on left side of pelvic floor, introduce seated pelvic floor AROM and downtraining stretches for hips and glutes, trunk   Marni Sins, PT 01/16/2024, 8:44 AM

## 2024-01-25 ENCOUNTER — Encounter: Payer: Self-pay | Admitting: Obstetrics & Gynecology

## 2024-01-25 ENCOUNTER — Ambulatory Visit: Payer: Self-pay | Admitting: Physical Therapy

## 2024-01-25 DIAGNOSIS — R293 Abnormal posture: Secondary | ICD-10-CM

## 2024-01-25 DIAGNOSIS — M6281 Muscle weakness (generalized): Secondary | ICD-10-CM | POA: Diagnosis not present

## 2024-01-25 DIAGNOSIS — R279 Unspecified lack of coordination: Secondary | ICD-10-CM

## 2024-01-25 NOTE — Therapy (Signed)
 OUTPATIENT PHYSICAL THERAPY FEMALE PELVIC TREATMENT   Patient Name: Becky Bowers MRN: [id number] DOB:10/11/1955, 68 y.o., female Today's Date: 01/25/2024  END OF SESSION:  PT End of Session - 01/25/24 1322     Visit Number 5    Number of Visits 10    Date for PT Re-Evaluation 06/27/24    Authorization Type [employer]    PT Start Time 1230    PT Stop Time 1315    PT Time Calculation (min) 45 min    Activity Tolerance Patient tolerated treatment well    Behavior During Therapy [facility] for tasks assessed/performed              Past Medical History:  Diagnosis Date   Arthritis    Concussion 09/12/2019   Past Surgical History:  Procedure Laterality Date   APPENDECTOMY  1968   DILATION AND CURETTAGE OF UTERUS  1990   TONSILLECTOMY  1980   Patient Active Problem List   Diagnosis Date Noted   Osteoporosis 10/11/2023   Right hip pain 08/17/2023   Greater trochanteric bursitis, right 05/31/2023   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 02/03/2023   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   Elevated LDL cholesterol level 04/14/2021   Polyarthralgia 03/17/2021   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, right trigger thumb 04/24/2018   Numbness of right third and fourth toes 03/19/2018   Primary osteoarthritis of both hips 12/08/2015   Headache 08/12/2010   Lumbar degenerative disc disease 01/07/2008    PCP: Cydney Draft, MD  REFERRING PROVIDER: Rik Chasten, MD  REFERRING DIAG: R10.2 (ICD-10-CM) - Pelvic pain  THERAPY DIAG:  Muscle weakness (generalized)  Abnormal posture  Unspecified lack of coordination  Rationale for Evaluation and Treatment: Rehabilitation  ONSET DATE: within the last few years   SUBJECTIVE:                                                                                                                                                                                            SUBJECTIVE STATEMENT: She has been using her estrogen cream nightly and she cannot tell if it is helping anything. She plans to continue using this. She had intercourse two times since last visit. She did not have any pain with this. No pelvic pain outside of intercourse in the pelvis in the past week. No urinary or bowel related concerns to report since last visit.   From eval: Patient reports that intercourse is painful and not enjoyable at all. This started a few years ago and she reports no use of lubrication at home. She  started using estradiol  and this has helped some with the dryness.  Fluid intake: 64 oz water daily, 1 or 2 cups of coffee daily   PAIN:  Are you having pain? No NPRS scale: 0/10  PRECAUTIONS: None  RED FLAGS: None   WEIGHT BEARING RESTRICTIONS: No  FALLS:  Has patient fallen in last 6 months? No  OCCUPATION: retired   ACTIVITY LEVEL: walks daily, takes pilates 1x/wk (1 hour)   PLOF: Independent  PATIENT GOALS: decrease pain with vaginal intercourse   BOWEL MOVEMENT: Pain with bowel movement: No Type of bowel movement:Type (Bristol Stool Scale) 3, Frequency 3-4x/wk, Strain yes, and Splinting yes Fully empty rectum: Yes:   Leakage: No Pads: No Fiber supplement/laxative No  URINATION: Pain with urination: No Fully empty bladder: Yes:   Stream: Strong Urgency: Yes  Frequency: within normal limits, 1x/night  Leakage: none Pads: No  INTERCOURSE:  Ability to have vaginal penetration Yes  Pain with intercourse: Initial Penetration, During Penetration, and After Intercourse Dryness: Yes  Marinoff Scale: 3/3  PREGNANCY: Vaginal deliveries 1   PROLAPSE: None  OBJECTIVE:  Note: Objective measures were completed at Evaluation unless otherwise noted.  PATIENT SURVEYS:   PFIQ-7: 54  COGNITION: Overall cognitive status: Within functional limits for tasks assessed     SENSATION: Light touch: Appears intact  LUMBAR SPECIAL TESTS:  Single  leg stance test: Positive  FUNCTIONAL TESTS:  Squat: bilateral dynamic knee valgus with loading, no significant lumbopelvic stiffness present    GAIT: Assistive device utilized: None Comments: mild trendelenburg gait pattern with ambulation   POSTURE: rounded shoulders, forward head, and increased thoracic kyphosis   LUMBARAROM/PROM:  A/PROM A/PROM  eval  Flexion Within normal limits   Extension 25% limited  Right lateral flexion Within normal limits   Left lateral flexion Within normal limits   Right rotation 25% limited   Left rotation 25% limited   (Blank rows = not tested)  LOWER EXTREMITY ROM: within functional limits   LOWER EXTREMITY MMT: 4/5 bilateral knees and hips grossly   PALPATION:   General: no significant tenderness to palpation of bilateral adductors or hip flexors   Pelvic Alignment: within normal limits   Abdominal: upper chest breathing and abdominal bracing at rest, decreased lower rib excursion with inhalation                 External Perineal Exam: mild dryness noted with sufficient clitoral hood mobility present                              Internal Pelvic Floor: patient demonstrates mild overactivity in left side of pelvic floor, both superficially and deep. She reports mild burning sensation with palpation of left levator ani group. Patient's pelvic floor is lacking active range of motion due to stiffness in the musculature. With inhalation, patient is able to actively lengthen her pelvic floor and shorten the musculature with exhalation. Following deep diaphragmatic breathing interventions, internal burning sensation decreased. No pain following today's examination.   Patient confirms identification and approves PT to assess internal pelvic floor and treatment Yes No emotional/communication barriers or cognitive limitation. Patient is motivated to learn. Patient understands and agrees with treatment goals and plan. PT explains patient will be examined  in standing, sitting, and lying down to see how their muscles and joints work. When they are ready, they will be asked to remove their underwear so PT can examine their perineum. The patient is  also given the option of providing their own chaperone as one is not provided in our facility. The patient also has the right and is explained the right to defer or refuse any part of the evaluation or treatment including the internal exam. With the patient's consent, PT will use one gloved finger to gently assess the muscles of the pelvic floor, seeing how well it contracts and relaxes and if there is muscle symmetry. After, the patient will get dressed and PT and patient will discuss exam findings and plan of care. PT and patient discuss plan of care, schedule, attendance policy and HEP activities.  PELVIC MMT:   MMT eval  Vaginal 5/5, 10 quick flicks, 10 second hold   Internal Anal Sphincter   External Anal Sphincter   Puborectalis   Diastasis Recti   (Blank rows = not tested)       TONE: Normal throughout superficial and deep pelvic floor   PROLAPSE: N/A  TODAY'S TREATMENT:                                                                                                                              DATE:   01/10/24: Neuro re-ed: Hooklying diaphragmatic breathing + pelvic floor lengthening with inhalation and shortening with exhalation 3x10  Sit to stand + ball squeeze + diaphragmatic breathing 2x10  Sidelying clamshell + reverse clamshell + diaphragmatic breathing 2x10 each side  Bridge + adductor ball squeeze + diaphragmatic breathing 2x10  Therapeutic exercise: Single knee to chest stretch + diaphragmatic breathing 2x73min  Pretzel piriformis stretch + diaphragmatic breathing 2x51min  Adductor rock backs + diaphragmatic breathing 2x49min each side  Dynamic hamstring/hip flexor stretch + diaphragmatic breathing 2x28min  Supine butterfly stretch + diaphragmatic breathing 2x28min  Childs pose +  diaphragmatic breathing 2x52min  Self care:  Relative anatomy and the connection between the diaphragm and pelvic floor, how lubrication can affect pelvic pain during intercourse and vaginal moisture level education  Manual therapy: Attaday use on right hamstring and use of massage ball/lacrosse ball for STM of proximal right hamstring   01/16/24:  Neuro re-ed: Vibration plate: standing 2x60min, hamstring stretch each  Hooklying diaphragmatic breathing + pelvic floor lengthening with inhalation and shortening with exhalation 3x10  Therapeutic exercise: Single knee to chest stretch + diaphragmatic breathing 2x45min  Pretzel piriformis stretch + diaphragmatic breathing 2x43min  Adductor rock backs + diaphragmatic breathing 2x54min each side  Dynamic hamstring/hip flexor stretch + diaphragmatic breathing 2x66min  Supine butterfly stretch + diaphragmatic breathing 2x69min  Childs pose + diaphragmatic breathing 2x29min  Manual therapy: Internal introital stretching at pelvic floor - vaginally Superficial pelvic floor downtraining and manual therapy to decrease tension at introitus   01/25/24:  Neuro re-ed: STANDING diaphragmatic breathing + pelvic floor lengthening with inhalation and shortening with exhalation 3x10  Seated pelvic tilt + diaphragmatic breathing 2x10  Squat + GTB around knees + chair touch + diaphragmatic breathing 2x10  Seated adductor ball squeeze +  internal rotation (RTB) + diaphragmatic breathing 2x20 alternating sides  Single leg deadlift + 10# KB + diaphragmatic breathing 2x10  Self care: Education surrounding topical estrogen cream and its benefits: clitoral hood mobility, muscle bulking   PATIENT EDUCATION:  Education details: Relative anatomy and the connection between the diaphragm and pelvic floor, how lubrication can affect pelvic pain during intercourse and vaginal moisture level education  Person educated: Patient Education method: Explanation, Demonstration,  Tactile cues, Verbal cues, and Handouts Education comprehension: verbalized understanding, returned demonstration, verbal cues required, tactile cues required, and needs further education  HOME EXERCISE PROGRAM: Access Code: [email or web address] URL: [email or web address] Date: 01/25/2024 Prepared by: Robbin Chill  Exercises - Standing Pelvic Floor Contraction  - 1 x daily - 7 x weekly - 2 sets - 10 reps - Seated Pelvic Tilt  - 1 x daily - 7 x weekly - 2 sets - 10 reps - Squat with Chair Touch and Resistance Loop  - 1 x daily - 7 x weekly - 2 sets - 10 reps - Seated Hip Internal Rotation with Ball and Resistance  - 1 x daily - 7 x weekly - 2 sets - 10 reps - Single Leg Deadlift with Kettlebell  - 1 x daily - 7 x weekly - 2 sets - 10 reps - Supine Butterfly Groin Stretch  - 1 x daily - 7 x weekly - 2 sets - hold - Child's Pose Stretch  - 1 x daily - 7 x weekly - 2 sets - hold - Supine Figure 4 Piriformis Stretch  - 1 x daily - 7 x weekly - 2 sets - hold - Kneeling Adductor Stretch with Hip External Rotation  - 1 x daily - 7 x weekly - 2 sets - hold - Half Kneel Dynamic Hamstrings and Contralateral Hip Flexor Stretch  - 1 x daily - 7 x weekly - 2 sets - hold  ASSESSMENT:  CLINICAL IMPRESSION: Patient is a 68 y.o. female  who was seen today for physical therapy treatment for pelvic pain during intercourse. Patient reports 0/10 pain today and has been using her topical estrogen cream, unsure if she wants to continue it. She did have successful intercourse without pain, which could be due to estrogen cream, so PT encouraged her to continue use of cream to see if it can improve clitoral hood mobility and pelvic floor muscle bulking to make intercourse more enjoyable. Exercises progressed in intensity today and patient had no pain with these. Overall, patient tolerated session well and Pt would benefit from additional PT to further address deficits.    OBJECTIVE  IMPAIRMENTS: decreased coordination, decreased endurance, decreased mobility, decreased ROM, decreased strength, and pain.   ACTIVITY LIMITATIONS: vaginal penetration during sexual intercourse  PARTICIPATION LIMITATIONS: pelvic examinations  PERSONAL FACTORS: Age, Past/current experiences, and Time since onset of injury/illness/exacerbation are also affecting patient's functional outcome.   REHAB POTENTIAL: Good  CLINICAL DECISION MAKING: Stable/uncomplicated  EVALUATION COMPLEXITY: Low   GOALS: Goals reviewed with patient? Yes  SHORT TERM GOALS: Target date: 01/23/2024  Pt will be independent with HEP.  Baseline: Goal status: INITIAL  2.  Pt will be independent with diaphragmatic breathing and down training activities in order to improve pelvic floor relaxation. Baseline:  Goal status: INITIAL  3.  Pt will report 50% reduction of pain during vaginal penetration due to improvements in posture, strength, and muscle length to allow patient to participate in intercourse and gynecological visits to improve overall quality of life  and intimate relationship with partner.  Baseline:  Goal status: INITIAL  LONG TERM GOALS: Target date: 06/27/2024   Pt will be independent with advanced HEP. Baseline:  Goal status: INITIAL  2.  Pt will report 0/10 pain with vaginal penetration in order to improve intimate relationship with partner and to allow patient to participate in medical examinations without pain being a limiting factor.  Baseline:  Goal status: INITIAL  3.  Pt to demonstrate improved coordination of pelvic floor and breathing mechanics with 10# squat with appropriate synergistic patterns to decrease pain at least 75% of the time for improved ability to complete a 30 minute workout with strain at pelvic floor and symptoms.    Baseline:  Goal status: INITIAL  4.  Pt will be independent with dilator program and progressions to decrease vaginal sensitization and return to pain  free vaginal penetration to allow patient to participate in intercourse and gynecological examinations without pain. Baseline:  Goal status: INITIAL  PLAN:  PT FREQUENCY: 1-2x/week  PT DURATION: 6 months  PLANNED INTERVENTIONS: 97110-Therapeutic exercises, 97530- Therapeutic activity, 97112- Neuromuscular re-education, 97535- Self Care, 16109- Manual therapy, Patient/Family education, Taping, Dry Needling, Joint mobilization, Spinal mobilization, Scar mobilization, Cryotherapy, and Moist heat  PLAN FOR NEXT SESSION: internal vaginal treatment to decrease tension on left side of pelvic floor, introduce seated pelvic floor AROM and downtraining stretches for hips and glutes, trunk   Marni Sins, PT 01/25/2024, 1:23 PM

## 2024-01-30 ENCOUNTER — Other Ambulatory Visit: Payer: Self-pay | Admitting: Obstetrics & Gynecology

## 2024-01-30 MED ORDER — ESTROGENS CONJUGATED 0.625 MG/GM VA CREA: TOPICAL_CREAM | VAGINAL | 4 refills | Status: DC

## 2024-01-30 NOTE — Progress Notes (Signed)
 Premarin Cream for vestibule sent to pharmacy.  Pt had good response from sample.

## 2024-02-01 ENCOUNTER — Ambulatory Visit: Payer: Self-pay | Admitting: Physical Therapy

## 2024-02-01 DIAGNOSIS — M6281 Muscle weakness (generalized): Secondary | ICD-10-CM

## 2024-02-01 DIAGNOSIS — R293 Abnormal posture: Secondary | ICD-10-CM | POA: Diagnosis not present

## 2024-02-01 DIAGNOSIS — R279 Unspecified lack of coordination: Secondary | ICD-10-CM | POA: Diagnosis not present

## 2024-02-01 NOTE — Therapy (Signed)
 OUTPATIENT PHYSICAL THERAPY FEMALE PELVIC TREATMENT   Patient Name: Becky Bowers MRN: [id number] DOB:January 30, 1956, 68 y.o., female Today's Date: 02/01/2024  END OF SESSION:  PT End of Session - 02/01/24 1056     Visit Number 6    Number of Visits 10    Date for PT Re-Evaluation 06/27/24    Authorization Type [employer]    PT Start Time 1015    PT Stop Time 1055    PT Time Calculation (min) 40 min    Activity Tolerance Patient tolerated treatment well    Behavior During Therapy [facility] for tasks assessed/performed               Past Medical History:  Diagnosis Date   Arthritis    Concussion 09/12/2019   Past Surgical History:  Procedure Laterality Date   APPENDECTOMY  1968   DILATION AND CURETTAGE OF UTERUS  1990   TONSILLECTOMY  1980   Patient Active Problem List   Diagnosis Date Noted   Osteoporosis 10/11/2023   Right hip pain 08/17/2023   Greater trochanteric bursitis, right 05/31/2023   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 02/03/2023   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   Elevated LDL cholesterol level 04/14/2021   Polyarthralgia 03/17/2021   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, right trigger thumb 04/24/2018   Numbness of right third and fourth toes 03/19/2018   Primary osteoarthritis of both hips 12/08/2015   Headache 08/12/2010   Lumbar degenerative disc disease 01/07/2008    PCP: Alvan Dorothyann BIRCH, MD  REFERRING PROVIDER: Cris Burnard DEL, MD  REFERRING DIAG: R10.2 (ICD-10-CM) - Pelvic pain  THERAPY DIAG:  Muscle weakness (generalized)  Rationale for Evaluation and Treatment: Rehabilitation  ONSET DATE: within the last few years   SUBJECTIVE:                                                                                                                                                                                           SUBJECTIVE STATEMENT: She reports that she did not  do her exercises consistently since she was at the beach. She had intercourse last night and did not use lubricant - is feeling is this morning. She is sore and it feels like a burning scratchy sensation.   From eval: Patient reports that intercourse is painful and not enjoyable at all. This started a few years ago and she reports no use of lubrication at home. She started using estradiol  and this has helped some with the dryness.  Fluid intake: 64 oz water daily, 1 or 2 cups of coffee daily  PAIN:  Are you having pain? No NPRS scale: 0/10  PRECAUTIONS: None  RED FLAGS: None   WEIGHT BEARING RESTRICTIONS: No  FALLS:  Has patient fallen in last 6 months? No  OCCUPATION: retired   ACTIVITY LEVEL: walks daily, takes pilates 1x/wk (1 hour)   PLOF: Independent  PATIENT GOALS: decrease pain with vaginal intercourse   BOWEL MOVEMENT: Pain with bowel movement: No Type of bowel movement:Type (Bristol Stool Scale) 3, Frequency 3-4x/wk, Strain yes, and Splinting yes Fully empty rectum: Yes:   Leakage: No Pads: No Fiber supplement/laxative No  URINATION: Pain with urination: No Fully empty bladder: Yes:   Stream: Strong Urgency: Yes  Frequency: within normal limits, 1x/night  Leakage: none Pads: No  INTERCOURSE:  Ability to have vaginal penetration Yes  Pain with intercourse: Initial Penetration, During Penetration, and After Intercourse Dryness: Yes  Marinoff Scale: 3/3  PREGNANCY: Vaginal deliveries 1   PROLAPSE: None  OBJECTIVE:  Note: Objective measures were completed at Evaluation unless otherwise noted.  PATIENT SURVEYS:   PFIQ-7: 88  COGNITION: Overall cognitive status: Within functional limits for tasks assessed     SENSATION: Light touch: Appears intact  LUMBAR SPECIAL TESTS:  Single leg stance test: Positive  FUNCTIONAL TESTS:  Squat: bilateral dynamic knee valgus with loading, no significant lumbopelvic stiffness present    GAIT: Assistive  device utilized: None Comments: mild trendelenburg gait pattern with ambulation   POSTURE: rounded shoulders, forward head, and increased thoracic kyphosis   LUMBARAROM/PROM:  A/PROM A/PROM  eval  Flexion Within normal limits   Extension 25% limited  Right lateral flexion Within normal limits   Left lateral flexion Within normal limits   Right rotation 25% limited   Left rotation 25% limited   (Blank rows = not tested)  LOWER EXTREMITY ROM: within functional limits   LOWER EXTREMITY MMT: 4/5 bilateral knees and hips grossly   PALPATION:   General: no significant tenderness to palpation of bilateral adductors or hip flexors   Pelvic Alignment: within normal limits   Abdominal: upper chest breathing and abdominal bracing at rest, decreased lower rib excursion with inhalation                 External Perineal Exam: mild dryness noted with sufficient clitoral hood mobility present                              Internal Pelvic Floor: patient demonstrates mild overactivity in left side of pelvic floor, both superficially and deep. She reports mild burning sensation with palpation of left levator ani group. Patient's pelvic floor is lacking active range of motion due to stiffness in the musculature. With inhalation, patient is able to actively lengthen her pelvic floor and shorten the musculature with exhalation. Following deep diaphragmatic breathing interventions, internal burning sensation decreased. No pain following today's examination.   Patient confirms identification and approves PT to assess internal pelvic floor and treatment Yes No emotional/communication barriers or cognitive limitation. Patient is motivated to learn. Patient understands and agrees with treatment goals and plan. PT explains patient will be examined in standing, sitting, and lying down to see how their muscles and joints work. When they are ready, they will be asked to remove their underwear so PT can examine  their perineum. The patient is also given the option of providing their own chaperone as one is not provided in our facility. The patient also has the right and is explained the  right to defer or refuse any part of the evaluation or treatment including the internal exam. With the patient's consent, PT will use one gloved finger to gently assess the muscles of the pelvic floor, seeing how well it contracts and relaxes and if there is muscle symmetry. After, the patient will get dressed and PT and patient will discuss exam findings and plan of care. PT and patient discuss plan of care, schedule, attendance policy and HEP activities.  PELVIC MMT:   MMT eval  Vaginal 5/5, 10 quick flicks, 10 second hold   Internal Anal Sphincter   External Anal Sphincter   Puborectalis   Diastasis Recti   (Blank rows = not tested)       TONE: Normal throughout superficial and deep pelvic floor   PROLAPSE: N/A  TODAY'S TREATMENT:                                                                                                                              DATE:   01/16/24:  Neuro re-ed: Vibration plate: standing 2x61min, hamstring stretch each  Hooklying diaphragmatic breathing + pelvic floor lengthening with inhalation and shortening with exhalation 3x10  Therapeutic exercise: Single knee to chest stretch + diaphragmatic breathing 2x78min  Pretzel piriformis stretch + diaphragmatic breathing 2x56min  Adductor rock backs + diaphragmatic breathing 2x88min each side  Dynamic hamstring/hip flexor stretch + diaphragmatic breathing 2x57min  Supine butterfly stretch + diaphragmatic breathing 2x39min  Childs pose + diaphragmatic breathing 2x49min  Manual therapy: Internal introital stretching at pelvic floor - vaginally Superficial pelvic floor downtraining and manual therapy to decrease tension at introitus   01/25/24:  Neuro re-ed: STANDING diaphragmatic breathing + pelvic floor lengthening with inhalation and  shortening with exhalation 3x10  Seated pelvic tilt + diaphragmatic breathing 2x10  Squat + GTB around knees + chair touch + diaphragmatic breathing 2x10  Seated adductor ball squeeze + internal rotation (RTB) + diaphragmatic breathing 2x20 alternating sides  Single leg deadlift + 10# KB + diaphragmatic breathing 2x10  Self care: Education surrounding topical estrogen cream and its benefits: clitoral hood mobility, muscle bulking   02/01/24:  Neuro re-ed: STANDING diaphragmatic breathing + pelvic floor lengthening with inhalation and shortening with exhalation 3x10  Seated pelvic tilt + diaphragmatic breathing 2x76min  Bird dog + diaphragmatic breathing 2x10 alternating sides  Seated adductor ball squeeze + internal rotation (RTB) + diaphragmatic breathing 2x20 alternating sides  Therapeutic exercise:  Squat + GTB around knees + chair touch + diaphragmatic breathing 2x10  Squat + GTB around knees + 10# KB + diaphragmatic breathing 2x10  Single leg deadlift + 10# KB + diaphragmatic breathing 2x10  Self care: Education surrounding topical estrogen cream and its benefits: clitoral hood mobility, muscle bulking   PATIENT EDUCATION:  Education details: Relative anatomy and the connection between the diaphragm and pelvic floor, how lubrication can affect pelvic pain during intercourse and vaginal moisture level education  Person educated: Patient Education  method: Explanation, Demonstration, Tactile cues, Verbal cues, and Handouts Education comprehension: verbalized understanding, returned demonstration, verbal cues required, tactile cues required, and needs further education  HOME EXERCISE PROGRAM: Access Code: [email or web address] URL: [email or web address] Date: 02/01/2024 Prepared by: Celena Domino  Exercises - Standing Pelvic Floor Contraction  - 1 x daily - 7 x weekly - 1 sets - 10 reps - Seated Pelvic Tilt  - 1 x daily - 7 x weekly - 2 sets - 10 reps - Squat with Chair Touch and  Resistance Loop  - 1 x daily - 7 x weekly - 2 sets - 10 reps - Seated Hip Internal Rotation with Ball and Resistance  - 1 x daily - 7 x weekly - 2 sets - 10 reps - Single Leg Deadlift with Kettlebell  - 1 x daily - 7 x weekly - 2 sets - 10 reps - Bird Dog  - 1 x daily - 7 x weekly - 2 sets - 10 reps - Supine Butterfly Groin Stretch  - 1 x daily - 7 x weekly - 2 sets - hold - Child's Pose Stretch  - 1 x daily - 7 x weekly - 2 sets - hold - Supine Figure 4 Piriformis Stretch  - 1 x daily - 7 x weekly - 2 sets - hold - Kneeling Adductor Stretch with Hip External Rotation  - 1 x daily - 7 x weekly - 2 sets - hold - Half Kneel Dynamic Hamstrings and Contralateral Hip Flexor Stretch  - 1 x daily - 7 x weekly - 2 sets - hold  ASSESSMENT:  CLINICAL IMPRESSION: Patient is a 68 y.o. female  who was seen today for physical therapy treatment for pelvic pain during intercourse. She had intercourse last night and did not use lubricant - is feeling is this morning. She is sore and it feels like a burning scratchy sensation. She thinks this is from lack of lubricant and plans to resume use. Exercises progressed in intensity today with max cueing from PT to coordinate movement and breath control, and patient had no pain with these. Overall, patient tolerated session well and Pt would benefit from additional PT to further address deficits.    OBJECTIVE IMPAIRMENTS: decreased coordination, decreased endurance, decreased mobility, decreased ROM, decreased strength, and pain.   ACTIVITY LIMITATIONS: vaginal penetration during sexual intercourse  PARTICIPATION LIMITATIONS: pelvic examinations  PERSONAL FACTORS: Age, Past/current experiences, and Time since onset of injury/illness/exacerbation are also affecting patient's functional outcome.   REHAB POTENTIAL: Good  CLINICAL DECISION MAKING: Stable/uncomplicated  EVALUATION COMPLEXITY: Low   GOALS: Goals reviewed with patient?  Yes  SHORT TERM GOALS: Target date: 01/23/2024  Pt will be independent with HEP.  Baseline: Goal status: INITIAL  2.  Pt will be independent with diaphragmatic breathing and down training activities in order to improve pelvic floor relaxation. Baseline:  Goal status: INITIAL  3.  Pt will report 50% reduction of pain during vaginal penetration due to improvements in posture, strength, and muscle length to allow patient to participate in intercourse and gynecological visits to improve overall quality of life and intimate relationship with partner.  Baseline:  Goal status: INITIAL  LONG TERM GOALS: Target date: 06/27/2024   Pt will be independent with advanced HEP. Baseline:  Goal status: INITIAL  2.  Pt will report 0/10 pain with vaginal penetration in order to improve intimate relationship with partner and to allow patient to participate in medical examinations without pain being a limiting  factor.  Baseline:  Goal status: INITIAL  3.  Pt to demonstrate improved coordination of pelvic floor and breathing mechanics with 10# squat with appropriate synergistic patterns to decrease pain at least 75% of the time for improved ability to complete a 30 minute workout with strain at pelvic floor and symptoms.    Baseline:  Goal status: INITIAL  4.  Pt will be independent with dilator program and progressions to decrease vaginal sensitization and return to pain free vaginal penetration to allow patient to participate in intercourse and gynecological examinations without pain. Baseline:  Goal status: INITIAL  PLAN:  PT FREQUENCY: 1-2x/week  PT DURATION: 6 months  PLANNED INTERVENTIONS: 97110-Therapeutic exercises, 97530- Therapeutic activity, 97112- Neuromuscular re-education, 97535- Self Care, [id number]- Manual therapy, Patient/Family education, Taping, Dry Needling, Joint mobilization, Spinal mobilization, Scar mobilization, Cryotherapy, and Moist heat  PLAN FOR NEXT SESSION: internal  vaginal treatment to decrease tension on left side of pelvic floor, introduce seated pelvic floor AROM and downtraining stretches for hips and glutes, trunk   Celena JAYSON Domino, PT 02/01/2024, 10:57 AM

## 2024-02-06 ENCOUNTER — Ambulatory Visit: Payer: Self-pay | Attending: Obstetrics & Gynecology | Admitting: Physical Therapy

## 2024-02-06 DIAGNOSIS — M6281 Muscle weakness (generalized): Secondary | ICD-10-CM | POA: Insufficient documentation

## 2024-02-06 DIAGNOSIS — R293 Abnormal posture: Secondary | ICD-10-CM | POA: Insufficient documentation

## 2024-02-08 ENCOUNTER — Ambulatory Visit (INDEPENDENT_AMBULATORY_CARE_PROVIDER_SITE_OTHER): Admitting: Sports Medicine

## 2024-02-08 ENCOUNTER — Ambulatory Visit: Admitting: Physical Therapy

## 2024-02-08 DIAGNOSIS — R200 Anesthesia of skin: Secondary | ICD-10-CM | POA: Diagnosis not present

## 2024-02-08 DIAGNOSIS — M6281 Muscle weakness (generalized): Secondary | ICD-10-CM

## 2024-02-08 DIAGNOSIS — R202 Paresthesia of skin: Secondary | ICD-10-CM

## 2024-02-08 DIAGNOSIS — R293 Abnormal posture: Secondary | ICD-10-CM | POA: Diagnosis not present

## 2024-02-08 NOTE — Therapy (Signed)
 OUTPATIENT PHYSICAL THERAPY FEMALE PELVIC TREATMENT   Patient Name: Becky Bowers MRN: [id number] DOB:1956/05/25, 68 y.o., female Today's Date: 02/08/2024  END OF SESSION:  PT End of Session - 02/08/24 0930     Visit Number 7    Number of Visits 10    Date for PT Re-Evaluation 06/27/24    Authorization Type [employer]    PT Start Time 0845    PT Stop Time 0930    PT Time Calculation (min) 45 min    Activity Tolerance Patient tolerated treatment well    Behavior During Therapy [facility] for tasks assessed/performed                Past Medical History:  Diagnosis Date   Arthritis    Concussion 09/12/2019   Past Surgical History:  Procedure Laterality Date   APPENDECTOMY  1968   DILATION AND CURETTAGE OF UTERUS  1990   TONSILLECTOMY  1980   Patient Active Problem List   Diagnosis Date Noted   Osteoporosis 10/11/2023   Right hip pain 08/17/2023   Greater trochanteric bursitis, right 05/31/2023   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 02/03/2023   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   Elevated LDL cholesterol level 04/14/2021   Polyarthralgia 03/17/2021   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, right trigger thumb 04/24/2018   Numbness of right third and fourth toes 03/19/2018   Primary osteoarthritis of both hips 12/08/2015   Headache 08/12/2010   Lumbar degenerative disc disease 01/07/2008    PCP: Alvan Dorothyann BIRCH, MD  REFERRING PROVIDER: Cris Burnard DEL, MD  REFERRING DIAG: R10.2 (ICD-10-CM) - Pelvic pain  THERAPY DIAG:  Muscle weakness (generalized)  Rationale for Evaluation and Treatment: Rehabilitation  ONSET DATE: within the last few years   SUBJECTIVE:                                                                                                                                                                                           SUBJECTIVE STATEMENT: Patient reports that she is  doing well today. She has not had intercourse since last visit, but in the last two-3 days she has had an itchy/scratchy feeling in the vaginal region. She is not sure why this is happening - but she has not been home and she has been using soaps that she doesn't usually use. No issues with bowel movements this week, no urinary related concerns.   From eval: Patient reports that intercourse is painful and not enjoyable at all. This started a few years ago and she reports no use of lubrication at home. She  started using estradiol  and this has helped some with the dryness.  Fluid intake: 64 oz water daily, 1 or 2 cups of coffee daily   PAIN:  Are you having pain? No NPRS scale: 0/10  PRECAUTIONS: None  RED FLAGS: None   WEIGHT BEARING RESTRICTIONS: No  FALLS:  Has patient fallen in last 6 months? No  OCCUPATION: retired   ACTIVITY LEVEL: walks daily, takes pilates 1x/wk (1 hour)   PLOF: Independent  PATIENT GOALS: decrease pain with vaginal intercourse   BOWEL MOVEMENT: Pain with bowel movement: No Type of bowel movement:Type (Bristol Stool Scale) 3, Frequency 3-4x/wk, Strain yes, and Splinting yes Fully empty rectum: Yes:   Leakage: No Pads: No Fiber supplement/laxative No  URINATION: Pain with urination: No Fully empty bladder: Yes:   Stream: Strong Urgency: Yes  Frequency: within normal limits, 1x/night  Leakage: none Pads: No  INTERCOURSE:  Ability to have vaginal penetration Yes  Pain with intercourse: Initial Penetration, During Penetration, and After Intercourse Dryness: Yes  Marinoff Scale: 3/3  PREGNANCY: Vaginal deliveries 1   PROLAPSE: None  OBJECTIVE:  Note: Objective measures were completed at Evaluation unless otherwise noted.  PATIENT SURVEYS:   PFIQ-7: 64  COGNITION: Overall cognitive status: Within functional limits for tasks assessed     SENSATION: Light touch: Appears intact  LUMBAR SPECIAL TESTS:  Single leg stance test:  Positive  FUNCTIONAL TESTS:  Squat: bilateral dynamic knee valgus with loading, no significant lumbopelvic stiffness present    GAIT: Assistive device utilized: None Comments: mild trendelenburg gait pattern with ambulation   POSTURE: rounded shoulders, forward head, and increased thoracic kyphosis   LUMBARAROM/PROM:  A/PROM A/PROM  eval  Flexion Within normal limits   Extension 25% limited  Right lateral flexion Within normal limits   Left lateral flexion Within normal limits   Right rotation 25% limited   Left rotation 25% limited   (Blank rows = not tested)  LOWER EXTREMITY ROM: within functional limits   LOWER EXTREMITY MMT: 4/5 bilateral knees and hips grossly   PALPATION:   General: no significant tenderness to palpation of bilateral adductors or hip flexors   Pelvic Alignment: within normal limits   Abdominal: upper chest breathing and abdominal bracing at rest, decreased lower rib excursion with inhalation                 External Perineal Exam: mild dryness noted with sufficient clitoral hood mobility present                              Internal Pelvic Floor: patient demonstrates mild overactivity in left side of pelvic floor, both superficially and deep. She reports mild burning sensation with palpation of left levator ani group. Patient's pelvic floor is lacking active range of motion due to stiffness in the musculature. With inhalation, patient is able to actively lengthen her pelvic floor and shorten the musculature with exhalation. Following deep diaphragmatic breathing interventions, internal burning sensation decreased. No pain following today's examination.   Patient confirms identification and approves PT to assess internal pelvic floor and treatment Yes No emotional/communication barriers or cognitive limitation. Patient is motivated to learn. Patient understands and agrees with treatment goals and plan. PT explains patient will be examined in standing,  sitting, and lying down to see how their muscles and joints work. When they are ready, they will be asked to remove their underwear so PT can examine their perineum. The patient is  also given the option of providing their own chaperone as one is not provided in our facility. The patient also has the right and is explained the right to defer or refuse any part of the evaluation or treatment including the internal exam. With the patient's consent, PT will use one gloved finger to gently assess the muscles of the pelvic floor, seeing how well it contracts and relaxes and if there is muscle symmetry. After, the patient will get dressed and PT and patient will discuss exam findings and plan of care. PT and patient discuss plan of care, schedule, attendance policy and HEP activities.  PELVIC MMT:   MMT eval  Vaginal 5/5, 10 quick flicks, 10 second hold   Internal Anal Sphincter   External Anal Sphincter   Puborectalis   Diastasis Recti   (Blank rows = not tested)       TONE: Normal throughout superficial and deep pelvic floor   PROLAPSE: N/A  TODAY'S TREATMENT:                                                                                                                              DATE:   01/25/24:  Neuro re-ed: STANDING diaphragmatic breathing + pelvic floor lengthening with inhalation and shortening with exhalation 3x10  Seated pelvic tilt + diaphragmatic breathing 2x10  Squat + GTB around knees + chair touch + diaphragmatic breathing 2x10  Seated adductor ball squeeze + internal rotation (RTB) + diaphragmatic breathing 2x20 alternating sides  Single leg deadlift + 10# KB + diaphragmatic breathing 2x10  Self care: Education surrounding topical estrogen cream and its benefits: clitoral hood mobility, muscle bulking   02/01/24:  Neuro re-ed: STANDING diaphragmatic breathing + pelvic floor lengthening with inhalation and shortening with exhalation 3x10  Seated pelvic tilt + diaphragmatic  breathing 2x77min  Bird dog + diaphragmatic breathing 2x10 alternating sides  Seated adductor ball squeeze + internal rotation (RTB) + diaphragmatic breathing 2x20 alternating sides  Therapeutic exercise:  Squat + GTB around knees + chair touch + diaphragmatic breathing 2x10  Squat + GTB around knees + 10# KB + diaphragmatic breathing 2x10  Single leg deadlift + 10# KB + diaphragmatic breathing 2x10  Self care: Education surrounding topical estrogen cream and its benefits: clitoral hood mobility, muscle bulking   02/08/24:  Manual therapy  Internal vaginal recheck of pelvic floor and surrounding tissues  Hooklying diaphragmatic breathing + pelvic floor lengthening and shortening for AROM assessment  Neuro re-ed: STANDING diaphragmatic breathing + pelvic floor lengthening with inhalation and shortening with exhalation 3x10  Seated pelvic tilt + diaphragmatic breathing 2x36min  Bird dog knee taps  + diaphragmatic breathing 2x10 alternating sides  Standing half clam with foot on wall + RTB + diaphragmatic breathing 2x10  Therapeutic exercise: Single leg RDL holding 10# KB + diaphragmatic breathing 2x10  Double leg deadlift holding 10# KB + diaphragmatic breathing 2x10  Self care: Education surrounding topical estrogen cream and its benefits:  clitoral hood mobility, muscle bulking   PATIENT EDUCATION:  Education details: Relative anatomy and the connection between the diaphragm and pelvic floor, how lubrication can affect pelvic pain during intercourse and vaginal moisture level education  Person educated: Patient Education method: Explanation, Demonstration, Tactile cues, Verbal cues, and Handouts Education comprehension: verbalized understanding, returned demonstration, verbal cues required, tactile cues required, and needs further education  HOME EXERCISE PROGRAM: Access Code: [email or web address] URL: [email or web address] Date: 02/08/2024 Prepared by: Celena Domino  Exercises -  Standing Pelvic Floor Contraction  - 1 x daily - 7 x weekly - 1 sets - 10 reps - Seated Pelvic Tilt  - 1 x daily - 7 x weekly - 2 sets - 10 reps - Squat with Chair Touch and Resistance Loop  - 1 x daily - 7 x weekly - 2 sets - 10 reps - Standing Clam with Resistance Loop  - 1 x daily - 7 x weekly - 2 sets - 10 reps - Single Leg Deadlift with Kettlebell  - 1 x daily - 7 x weekly - 2 sets - 10 reps - Half Deadlift with Kettlebell  - 1 x daily - 7 x weekly - 2 sets - 10 reps - Bird Dog with Knee Taps  - 1 x daily - 7 x weekly - 2 sets - 10 reps - Supine Butterfly Groin Stretch  - 1 x daily - 7 x weekly - 2 sets - hold - Child's Pose Stretch  - 1 x daily - 7 x weekly - 2 sets - hold - Supine Figure 4 Piriformis Stretch  - 1 x daily - 7 x weekly - 2 sets - hold - Kneeling Adductor Stretch with Hip External Rotation  - 1 x daily - 7 x weekly - 2 sets - hold - Half Kneel Dynamic Hamstrings and Contralateral Hip Flexor Stretch  - 1 x daily - 7 x weekly - 2 sets - hold  ASSESSMENT:  CLINICAL IMPRESSION: Patient is a 68 y.o. female  who was seen today for physical therapy treatment for pelvic pain during intercourse. Patient has had some vaginal discomfort today and she isn't sure why. She describes this as an itchy sensation. Internal treatment performed to assess current status and check on range of motion coordination/control. She had no visible redness or yeast production externally. Her pelvic floor AROM control and coordination has improved significantly. Exercises progressed in intensity today with max cueing from PT to coordinate movement and breath control, and patient had no pain with these. Overall, patient tolerated session well and Pt would benefit from additional PT to further address deficits.    OBJECTIVE IMPAIRMENTS: decreased coordination, decreased endurance, decreased mobility, decreased ROM, decreased strength, and pain.   ACTIVITY LIMITATIONS: vaginal  penetration during sexual intercourse  PARTICIPATION LIMITATIONS: pelvic examinations  PERSONAL FACTORS: Age, Past/current experiences, and Time since onset of injury/illness/exacerbation are also affecting patient's functional outcome.   REHAB POTENTIAL: Good  CLINICAL DECISION MAKING: Stable/uncomplicated  EVALUATION COMPLEXITY: Low   GOALS: Goals reviewed with patient? Yes  SHORT TERM GOALS: Target date: 01/23/2024  Pt will be independent with HEP.  Baseline: Goal status: INITIAL  2.  Pt will be independent with diaphragmatic breathing and down training activities in order to improve pelvic floor relaxation. Baseline:  Goal status: INITIAL  3.  Pt will report 50% reduction of pain during vaginal penetration due to improvements in posture, strength, and muscle length to allow patient to participate in intercourse and  gynecological visits to improve overall quality of life and intimate relationship with partner.  Baseline:  Goal status: INITIAL  LONG TERM GOALS: Target date: 06/27/2024   Pt will be independent with advanced HEP. Baseline:  Goal status: INITIAL  2.  Pt will report 0/10 pain with vaginal penetration in order to improve intimate relationship with partner and to allow patient to participate in medical examinations without pain being a limiting factor.  Baseline:  Goal status: INITIAL  3.  Pt to demonstrate improved coordination of pelvic floor and breathing mechanics with 10# squat with appropriate synergistic patterns to decrease pain at least 75% of the time for improved ability to complete a 30 minute workout with strain at pelvic floor and symptoms.    Baseline:  Goal status: INITIAL  4.  Pt will be independent with dilator program and progressions to decrease vaginal sensitization and return to pain free vaginal penetration to allow patient to participate in intercourse and gynecological examinations without pain. Baseline:  Goal status:  INITIAL  PLAN:  PT FREQUENCY: 1-2x/week  PT DURATION: 6 months  PLANNED INTERVENTIONS: 97110-Therapeutic exercises, 97530- Therapeutic activity, 97112- Neuromuscular re-education, 97535- Self Care, [id number]- Manual therapy, Patient/Family education, Taping, Dry Needling, Joint mobilization, Spinal mobilization, Scar mobilization, Cryotherapy, and Moist heat  PLAN FOR NEXT SESSION: internal vaginal treatment to decrease tension on left side of pelvic floor, introduce seated pelvic floor AROM and downtraining stretches for hips and glutes, trunk   Celena JAYSON Domino, PT 02/08/2024, 9:31 AM

## 2024-02-08 NOTE — Patient Instructions (Signed)
 SABRA

## 2024-02-08 NOTE — Assessment & Plan Note (Signed)
 Very pleasant 67 year old female, having increasing numbness and tingling of the left hand, she describes it as the entire hand, occasional up the forearm, worse at night and worse when gripping the steering wheel. Not much pain in the neck. On exam she does have some thenar atrophy, she has otherwise a negative Tinel's and negative Phalen sign, she has a negative Spurling sign. As symptoms are worse at night and with gripping I do suspect more of a carpal tunnel process, adding nocturnal splinting, home PT. If not better in 6 weeks we will consider nerve conduction EMG.

## 2024-02-08 NOTE — Progress Notes (Signed)
    Procedures performed today:    None.  Independent interpretation of notes and tests performed by another provider:   None.  Brief History, Exam, Impression, and Recommendations:    Numbness and tingling in left hand Very pleasant 68 year old female, having increasing numbness and tingling of the left hand, she describes it as the entire hand, occasional up the forearm, worse at night and worse when gripping the steering wheel. Not much pain in the neck. On exam she does have some thenar atrophy, she has otherwise a negative Tinel's and negative Phalen sign, she has a negative Spurling sign. As symptoms are worse at night and with gripping I do suspect more of a carpal tunnel process, adding nocturnal splinting, home PT. If not better in 6 weeks we will consider nerve conduction EMG.    ____________________________________________ Debby PARAS. Curtis, M.D., ABFM., CAQSM., AME. Primary Care and Sports Medicine [facility]  Adjunct Professor of [facility] Medicine  [employer]  Restaurant manager, fast food

## 2024-02-13 ENCOUNTER — Encounter: Admitting: Physical Therapy

## 2024-02-28 ENCOUNTER — Ambulatory Visit: Admitting: Physical Therapy

## 2024-02-28 DIAGNOSIS — R293 Abnormal posture: Secondary | ICD-10-CM | POA: Diagnosis not present

## 2024-02-28 DIAGNOSIS — M6281 Muscle weakness (generalized): Secondary | ICD-10-CM | POA: Diagnosis not present

## 2024-02-28 NOTE — Therapy (Signed)
 OUTPATIENT PHYSICAL THERAPY FEMALE PELVIC TREATMENT   Patient Name: Becky Bowers MRN: [id number] DOB:1956-05-16, 68 y.o., female Today's Date: 02/28/2024  END OF SESSION:  PT End of Session - 02/28/24 1104     Visit Number 8    Number of Visits 10    Date for PT Re-Evaluation 06/27/24    Authorization Type [employer]    PT Start Time 1015    PT Stop Time 1100    PT Time Calculation (min) 45 min    Activity Tolerance Patient tolerated treatment well    Behavior During Therapy [facility] for tasks assessed/performed                 Past Medical History:  Diagnosis Date   Arthritis    Concussion 09/12/2019   Past Surgical History:  Procedure Laterality Date   APPENDECTOMY  1968   DILATION AND CURETTAGE OF UTERUS  1990   TONSILLECTOMY  1980   Patient Active Problem List   Diagnosis Date Noted   Numbness and tingling in left hand 02/08/2024   Osteoporosis 10/11/2023   Right hip pain 08/17/2023   Greater trochanteric bursitis, right 05/31/2023   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 02/03/2023   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   Elevated LDL cholesterol level 04/14/2021   Polyarthralgia 03/17/2021   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, right trigger thumb 04/24/2018   Numbness of right third and fourth toes 03/19/2018   Primary osteoarthritis of both hips 12/08/2015   Headache 08/12/2010   Lumbar degenerative disc disease 01/07/2008    PCP: Alvan Dorothyann BIRCH, MD  REFERRING PROVIDER: Cris Burnard DEL, MD  REFERRING DIAG: R10.2 (ICD-10-CM) - Pelvic pain  THERAPY DIAG:  Muscle weakness (generalized)  Rationale for Evaluation and Treatment: Rehabilitation  ONSET DATE: within the last few years   SUBJECTIVE:                                                                                                                                                                                            SUBJECTIVE STATEMENT: Patient reports that her pelvis has been okay. She states that her pelvis doesn't feel right - intercourse is not painful. She ordered some good clean love moisturizing gel from the website. Her vaginal region has been feeling irritated recently - she thinks this could be from the Premarin , so she is considering discontinuing this. She has a virtual therapy session in August for discussing pain with intercourse. She used to get up 1x/night to void religiously, but recently she has not been getting up.   From eval: Patient  reports that intercourse is painful and not enjoyable at all. This started a few years ago and she reports no use of lubrication at home. She started using estradiol  and this has helped some with the dryness.  Fluid intake: 64 oz water daily, 1 or 2 cups of coffee daily   PAIN:  Are you having pain? No NPRS scale: 0/10  PRECAUTIONS: None  RED FLAGS: None   WEIGHT BEARING RESTRICTIONS: No  FALLS:  Has patient fallen in last 6 months? No  OCCUPATION: retired   ACTIVITY LEVEL: walks daily, takes pilates 1x/wk (1 hour)   PLOF: Independent  PATIENT GOALS: decrease pain with vaginal intercourse   BOWEL MOVEMENT: Pain with bowel movement: No Type of bowel movement:Type (Bristol Stool Scale) 3, Frequency 3-4x/wk, Strain yes, and Splinting yes Fully empty rectum: Yes:   Leakage: No Pads: No Fiber supplement/laxative No  URINATION: Pain with urination: No Fully empty bladder: Yes:   Stream: Strong Urgency: Yes  Frequency: within normal limits, 1x/night  Leakage: none Pads: No  INTERCOURSE:  Ability to have vaginal penetration Yes  Pain with intercourse: Initial Penetration, During Penetration, and After Intercourse Dryness: Yes  Marinoff Scale: 3/3  PREGNANCY: Vaginal deliveries 1   PROLAPSE: None  OBJECTIVE:  Note: Objective measures were completed at Evaluation unless otherwise noted.  PATIENT SURVEYS:   PFIQ-7:  25  COGNITION: Overall cognitive status: Within functional limits for tasks assessed     SENSATION: Light touch: Appears intact  LUMBAR SPECIAL TESTS:  Single leg stance test: Positive  FUNCTIONAL TESTS:  Squat: bilateral dynamic knee valgus with loading, no significant lumbopelvic stiffness present    GAIT: Assistive device utilized: None Comments: mild trendelenburg gait pattern with ambulation   POSTURE: rounded shoulders, forward head, and increased thoracic kyphosis   LUMBARAROM/PROM:  A/PROM A/PROM  eval  Flexion Within normal limits   Extension 25% limited  Right lateral flexion Within normal limits   Left lateral flexion Within normal limits   Right rotation 25% limited   Left rotation 25% limited   (Blank rows = not tested)  LOWER EXTREMITY ROM: within functional limits   LOWER EXTREMITY MMT: 4/5 bilateral knees and hips grossly   PALPATION:   General: no significant tenderness to palpation of bilateral adductors or hip flexors   Pelvic Alignment: within normal limits   Abdominal: upper chest breathing and abdominal bracing at rest, decreased lower rib excursion with inhalation                 External Perineal Exam: mild dryness noted with sufficient clitoral hood mobility present                              Internal Pelvic Floor: patient demonstrates mild overactivity in left side of pelvic floor, both superficially and deep. She reports mild burning sensation with palpation of left levator ani group. Patient's pelvic floor is lacking active range of motion due to stiffness in the musculature. With inhalation, patient is able to actively lengthen her pelvic floor and shorten the musculature with exhalation. Following deep diaphragmatic breathing interventions, internal burning sensation decreased. No pain following today's examination.   Patient confirms identification and approves PT to assess internal pelvic floor and treatment Yes No  emotional/communication barriers or cognitive limitation. Patient is motivated to learn. Patient understands and agrees with treatment goals and plan. PT explains patient will be examined in standing, sitting, and lying down to see how  their muscles and joints work. When they are ready, they will be asked to remove their underwear so PT can examine their perineum. The patient is also given the option of providing their own chaperone as one is not provided in our facility. The patient also has the right and is explained the right to defer or refuse any part of the evaluation or treatment including the internal exam. With the patient's consent, PT will use one gloved finger to gently assess the muscles of the pelvic floor, seeing how well it contracts and relaxes and if there is muscle symmetry. After, the patient will get dressed and PT and patient will discuss exam findings and plan of care. PT and patient discuss plan of care, schedule, attendance policy and HEP activities.  PELVIC MMT:   MMT eval  Vaginal 5/5, 10 quick flicks, 10 second hold   Internal Anal Sphincter   External Anal Sphincter   Puborectalis   Diastasis Recti   (Blank rows = not tested)       TONE: Normal throughout superficial and deep pelvic floor   PROLAPSE: N/A  TODAY'S TREATMENT:                                                                                                                              DATE:   02/01/24:  Neuro re-ed: STANDING diaphragmatic breathing + pelvic floor lengthening with inhalation and shortening with exhalation 3x10  Seated pelvic tilt + diaphragmatic breathing 2x24min  Bird dog + diaphragmatic breathing 2x10 alternating sides  Seated adductor ball squeeze + internal rotation (RTB) + diaphragmatic breathing 2x20 alternating sides  Therapeutic exercise:  Squat + GTB around knees + chair touch + diaphragmatic breathing 2x10  Squat + GTB around knees + 10# KB + diaphragmatic breathing 2x10   Single leg deadlift + 10# KB + diaphragmatic breathing 2x10  Self care: Education surrounding topical estrogen cream and its benefits: clitoral hood mobility, muscle bulking   02/08/24:  Manual therapy  Internal vaginal recheck of pelvic floor and surrounding tissues  Hooklying diaphragmatic breathing + pelvic floor lengthening and shortening for AROM assessment  Neuro re-ed: STANDING diaphragmatic breathing + pelvic floor lengthening with inhalation and shortening with exhalation 3x10  Seated pelvic tilt + diaphragmatic breathing 2x30min  Bird dog knee taps  + diaphragmatic breathing 2x10 alternating sides  Standing half clam with foot on wall + RTB + diaphragmatic breathing 2x10  Therapeutic exercise: Single leg RDL holding 10# KB + diaphragmatic breathing 2x10  Double leg deadlift holding 10# KB + diaphragmatic breathing 2x10  Self care: Education surrounding topical estrogen cream and its benefits: clitoral hood mobility, muscle bulking   02/28/24:  Manual therapy  Internal vaginal recheck of pelvic floor and surrounding tissues  Hooklying diaphragmatic breathing + pelvic floor lengthening and shortening for AROM assessment  Neuro re-ed: Diaphragmatic breathing + pelvic floor lengthening 2x5 breaths internally each side  Standing half clam with foot on wall +  RTB + diaphragmatic breathing 2x10  Self care: Education surrounding topical estrogen cream and its benefits: clitoral hood mobility, muscle bulking  Discussion of vibrator during intercourse to promote relaxation, blood flow, and pleasure   PATIENT EDUCATION:  Education details: Relative anatomy and the connection between the diaphragm and pelvic floor, how lubrication can affect pelvic pain during intercourse and vaginal moisture level education  Person educated: Patient Education method: Explanation, Demonstration, Tactile cues, Verbal cues, and Handouts Education comprehension: verbalized understanding, returned  demonstration, verbal cues required, tactile cues required, and needs further education  HOME EXERCISE PROGRAM: Access Code: [email or web address] URL: [email or web address] Date: 02/08/2024 Prepared by: Celena Domino  Exercises - Standing Pelvic Floor Contraction  - 1 x daily - 7 x weekly - 1 sets - 10 reps - Seated Pelvic Tilt  - 1 x daily - 7 x weekly - 2 sets - 10 reps - Squat with Chair Touch and Resistance Loop  - 1 x daily - 7 x weekly - 2 sets - 10 reps - Standing Clam with Resistance Loop  - 1 x daily - 7 x weekly - 2 sets - 10 reps - Single Leg Deadlift with Kettlebell  - 1 x daily - 7 x weekly - 2 sets - 10 reps - Half Deadlift with Kettlebell  - 1 x daily - 7 x weekly - 2 sets - 10 reps - Bird Dog with Knee Taps  - 1 x daily - 7 x weekly - 2 sets - 10 reps - Supine Butterfly Groin Stretch  - 1 x daily - 7 x weekly - 2 sets - hold - Child's Pose Stretch  - 1 x daily - 7 x weekly - 2 sets - hold - Supine Figure 4 Piriformis Stretch  - 1 x daily - 7 x weekly - 2 sets - hold - Kneeling Adductor Stretch with Hip External Rotation  - 1 x daily - 7 x weekly - 2 sets - hold - Half Kneel Dynamic Hamstrings and Contralateral Hip Flexor Stretch  - 1 x daily - 7 x weekly - 2 sets - hold  ASSESSMENT:  CLINICAL IMPRESSION: Patient is a 68 y.o. female  who was seen today for physical therapy treatment for pelvic pain during intercourse. Patient has been having some vaginal discomfort that she thinks is from using Premarin , so she plans to stop this. We discussed the use of a vibrator during intercourse to promote pelvic floor relaxation, to increase blood flow, and to make intercourse more enjoyable overall for the patient. Internal treatment performed to assess current status and check on range of motion coordination/control. Her pelvic floor AROM control and coordination has improved significantly. With internal sustained pressure release, patient's left side of pelvic  floor was less uncomfortable with palpation. She had no trigger points in the right side of her pelvic floor today. 0/10 pain upon end of session. Overall, patient tolerated session well and Pt would benefit from additional PT to further address deficits.    OBJECTIVE IMPAIRMENTS: decreased coordination, decreased endurance, decreased mobility, decreased ROM, decreased strength, and pain.   ACTIVITY LIMITATIONS: vaginal penetration during sexual intercourse  PARTICIPATION LIMITATIONS: pelvic examinations  PERSONAL FACTORS: Age, Past/current experiences, and Time since onset of injury/illness/exacerbation are also affecting patient's functional outcome.   REHAB POTENTIAL: Good  CLINICAL DECISION MAKING: Stable/uncomplicated  EVALUATION COMPLEXITY: Low   GOALS: Goals reviewed with patient? Yes  SHORT TERM GOALS: Target date: 01/23/2024  Pt will be independent with  HEP.  Baseline: Goal status: INITIAL  2.  Pt will be independent with diaphragmatic breathing and down training activities in order to improve pelvic floor relaxation. Baseline:  Goal status: INITIAL  3.  Pt will report 50% reduction of pain during vaginal penetration due to improvements in posture, strength, and muscle length to allow patient to participate in intercourse and gynecological visits to improve overall quality of life and intimate relationship with partner.  Baseline:  Goal status: INITIAL  LONG TERM GOALS: Target date: 06/27/2024   Pt will be independent with advanced HEP. Baseline:  Goal status: INITIAL  2.  Pt will report 0/10 pain with vaginal penetration in order to improve intimate relationship with partner and to allow patient to participate in medical examinations without pain being a limiting factor.  Baseline:  Goal status: INITIAL  3.  Pt to demonstrate improved coordination of pelvic floor and breathing mechanics with 10# squat with appropriate synergistic patterns to decrease pain at least  75% of the time for improved ability to complete a 30 minute workout with strain at pelvic floor and symptoms.    Baseline:  Goal status: INITIAL  4.  Pt will be independent with dilator program and progressions to decrease vaginal sensitization and return to pain free vaginal penetration to allow patient to participate in intercourse and gynecological examinations without pain. Baseline:  Goal status: INITIAL  PLAN:  PT FREQUENCY: 1-2x/week  PT DURATION: 6 months  PLANNED INTERVENTIONS: 97110-Therapeutic exercises, 97530- Therapeutic activity, 97112- Neuromuscular re-education, 97535- Self Care, [id number]- Manual therapy, Patient/Family education, Taping, Dry Needling, Joint mobilization, Spinal mobilization, Scar mobilization, Cryotherapy, and Moist heat  PLAN FOR NEXT SESSION: internal vaginal treatment to decrease tension on left side of pelvic floor, introduce seated pelvic floor AROM and downtraining stretches for hips and glutes, trunk   Celena JAYSON Domino, PT 02/28/2024, 11:05 AM

## 2024-03-06 ENCOUNTER — Ambulatory Visit: Admitting: Physical Therapy

## 2024-03-06 DIAGNOSIS — M6281 Muscle weakness (generalized): Secondary | ICD-10-CM

## 2024-03-06 DIAGNOSIS — R293 Abnormal posture: Secondary | ICD-10-CM | POA: Diagnosis not present

## 2024-03-06 NOTE — Therapy (Signed)
 OUTPATIENT PHYSICAL THERAPY FEMALE PELVIC TREATMENT   Patient Name: Becky Bowers MRN: [id number] DOB:[birth year], 68 y.o., female Today's Date: 03/06/2024  END OF SESSION:  PT End of Session - 03/06/24 1058     Visit Number 9    Number of Visits 10    Date for PT Re-Evaluation 06/27/24    Authorization Type [employer]    PT Start Time 1015    PT Stop Time 1100    PT Time Calculation (min) 45 min    Activity Tolerance Patient tolerated treatment well    Behavior During Therapy [facility] for tasks assessed/performed                  Past Medical History:  Diagnosis Date   Arthritis    Concussion 09/12/2019   Past Surgical History:  Procedure Laterality Date   APPENDECTOMY  1968   DILATION AND CURETTAGE OF UTERUS  1990   TONSILLECTOMY  1980   Patient Active Problem List   Diagnosis Date Noted   Numbness and tingling in left hand 02/08/2024   Osteoporosis 10/11/2023   Right hip pain 08/17/2023   Greater trochanteric bursitis, right 05/31/2023   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 02/03/2023   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   Elevated LDL cholesterol level 04/14/2021   Polyarthralgia 03/17/2021   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, right trigger thumb 04/24/2018   Numbness of right third and fourth toes 03/19/2018   Primary osteoarthritis of both hips 12/08/2015   Headache 08/12/2010   Lumbar degenerative disc disease 01/07/2008    PCP: Alvan Dorothyann BIRCH, MD  REFERRING PROVIDER: Cris Burnard DEL, MD  REFERRING DIAG: R10.2 (ICD-10-CM) - Pelvic pain  THERAPY DIAG:  Muscle weakness (generalized)  Abnormal posture  Rationale for Evaluation and Treatment: Rehabilitation  ONSET DATE: within the last few years   SUBJECTIVE:                                                                                                                                                                                            SUBJECTIVE STATEMENT: Patient reports that she is doing well today. Her pelvic pain today is nonexistent. She tried to order good clean love vaginal moisturizer and they were out of stock, but she plans to try again. She has stopped the Premarin  completely. She purchased a dilator from [employer] and found pleasure from this. No urinary or bowel concerns to report.   From eval: Patient reports that intercourse is painful and not enjoyable at all. This started a few years ago and she reports no use of  lubrication at home. She started using estradiol  and this has helped some with the dryness.  Fluid intake: 64 oz water daily, 1 or 2 cups of coffee daily   PAIN:  Are you having pain? No NPRS scale: 0/10  PRECAUTIONS: None  RED FLAGS: None   WEIGHT BEARING RESTRICTIONS: No  FALLS:  Has patient fallen in last 6 months? No  OCCUPATION: retired   ACTIVITY LEVEL: walks daily, takes pilates 1x/wk (1 hour)   PLOF: Independent  PATIENT GOALS: decrease pain with vaginal intercourse   BOWEL MOVEMENT: Pain with bowel movement: No Type of bowel movement:Type (Bristol Stool Scale) 3, Frequency 3-4x/wk, Strain yes, and Splinting yes Fully empty rectum: Yes:   Leakage: No Pads: No Fiber supplement/laxative No  URINATION: Pain with urination: No Fully empty bladder: Yes:   Stream: Strong Urgency: Yes  Frequency: within normal limits, 1x/night  Leakage: none Pads: No  INTERCOURSE:  Ability to have vaginal penetration Yes  Pain with intercourse: Initial Penetration, During Penetration, and After Intercourse Dryness: Yes  Marinoff Scale: 3/3  PREGNANCY: Vaginal deliveries 1   PROLAPSE: None  OBJECTIVE:  Note: Objective measures were completed at Evaluation unless otherwise noted.  PATIENT SURVEYS:   PFIQ-7: 35  COGNITION: Overall cognitive status: Within functional limits for tasks assessed     SENSATION: Light touch: Appears intact  LUMBAR SPECIAL  TESTS:  Single leg stance test: Positive  FUNCTIONAL TESTS:  Squat: bilateral dynamic knee valgus with loading, no significant lumbopelvic stiffness present    GAIT: Assistive device utilized: None Comments: mild trendelenburg gait pattern with ambulation   POSTURE: rounded shoulders, forward head, and increased thoracic kyphosis   LUMBARAROM/PROM:  A/PROM A/PROM  eval  Flexion Within normal limits   Extension 25% limited  Right lateral flexion Within normal limits   Left lateral flexion Within normal limits   Right rotation 25% limited   Left rotation 25% limited   (Blank rows = not tested)  LOWER EXTREMITY ROM: within functional limits   LOWER EXTREMITY MMT: 4/5 bilateral knees and hips grossly   PALPATION:   General: no significant tenderness to palpation of bilateral adductors or hip flexors   Pelvic Alignment: within normal limits   Abdominal: upper chest breathing and abdominal bracing at rest, decreased lower rib excursion with inhalation                 External Perineal Exam: mild dryness noted with sufficient clitoral hood mobility present                              Internal Pelvic Floor: patient demonstrates mild overactivity in left side of pelvic floor, both superficially and deep. She reports mild burning sensation with palpation of left levator ani group. Patient's pelvic floor is lacking active range of motion due to stiffness in the musculature. With inhalation, patient is able to actively lengthen her pelvic floor and shorten the musculature with exhalation. Following deep diaphragmatic breathing interventions, internal burning sensation decreased. No pain following today's examination.   Patient confirms identification and approves PT to assess internal pelvic floor and treatment Yes No emotional/communication barriers or cognitive limitation. Patient is motivated to learn. Patient understands and agrees with treatment goals and plan. PT explains patient  will be examined in standing, sitting, and lying down to see how their muscles and joints work. When they are ready, they will be asked to remove their underwear so PT can examine their  perineum. The patient is also given the option of providing their own chaperone as one is not provided in our facility. The patient also has the right and is explained the right to defer or refuse any part of the evaluation or treatment including the internal exam. With the patient's consent, PT will use one gloved finger to gently assess the muscles of the pelvic floor, seeing how well it contracts and relaxes and if there is muscle symmetry. After, the patient will get dressed and PT and patient will discuss exam findings and plan of care. PT and patient discuss plan of care, schedule, attendance policy and HEP activities.  PELVIC MMT:   MMT eval  Vaginal 5/5, 10 quick flicks, 10 second hold   Internal Anal Sphincter   External Anal Sphincter   Puborectalis   Diastasis Recti   (Blank rows = not tested)       TONE: Normal throughout superficial and deep pelvic floor   PROLAPSE: N/A  TODAY'S TREATMENT:                                                                                                                              DATE:   02/08/24:  Manual therapy  Internal vaginal recheck of pelvic floor and surrounding tissues  Hooklying diaphragmatic breathing + pelvic floor lengthening and shortening for AROM assessment  Neuro re-ed: STANDING diaphragmatic breathing + pelvic floor lengthening with inhalation and shortening with exhalation 3x10  Seated pelvic tilt + diaphragmatic breathing 2x43min  Bird dog knee taps  + diaphragmatic breathing 2x10 alternating sides  Standing half clam with foot on wall + RTB + diaphragmatic breathing 2x10  Therapeutic exercise: Single leg RDL holding 10# KB + diaphragmatic breathing 2x10  Double leg deadlift holding 10# KB + diaphragmatic breathing 2x10  Self  care: Education surrounding topical estrogen cream and its benefits: clitoral hood mobility, muscle bulking   02/28/24:  Manual therapy  Internal vaginal recheck of pelvic floor and surrounding tissues  Hooklying diaphragmatic breathing + pelvic floor lengthening and shortening for AROM assessment  Neuro re-ed: Diaphragmatic breathing + pelvic floor lengthening 2x5 breaths internally each side  Standing half clam with foot on wall + RTB + diaphragmatic breathing 2x10  Self care: Education surrounding topical estrogen cream and its benefits: clitoral hood mobility, muscle bulking  Discussion of vibrator during intercourse to promote relaxation, blood flow, and pleasure   03/06/24:  Neuro re-ed: Bird dog knee taps holding 2# dumbbell + diaphragmatic breathing 2x10 each side  Primal push up + diaphragmatic breathing 2x10  Side plank + clamshell + diaphragmatic breathing 2x10 each side  Self care: Education surrounding topical estrogen cream and its benefits: clitoral hood mobility, muscle bulking  Discussion of vibrator during intercourse to promote relaxation, blood flow, and pleasure How to use a vibrational tool to promote pelvic floor musculature relaxation during intercourse and where to place it   PATIENT EDUCATION:  Education details: Relative anatomy and  the connection between the diaphragm and pelvic floor, how lubrication can affect pelvic pain during intercourse and vaginal moisture level education  Person educated: Patient Education method: Explanation, Demonstration, Tactile cues, Verbal cues, and Handouts Education comprehension: verbalized understanding, returned demonstration, verbal cues required, tactile cues required, and needs further education  HOME EXERCISE PROGRAM: Access Code: [email or web address] URL: [email or web address] Date: 03/06/2024 Prepared by: Celena Domino  Exercises - Standing Pelvic Floor Contraction  - 1 x daily - 7 x weekly - 1 sets - 10 reps -  Seated Pelvic Tilt  - 1 x daily - 7 x weekly - 2 sets - 10 reps - Squat with Chair Touch and Resistance Loop  - 1 x daily - 7 x weekly - 2 sets - 10 reps - Standing Clam with Resistance Loop  - 1 x daily - 7 x weekly - 2 sets - 10 reps - Single Leg Deadlift with Kettlebell  - 1 x daily - 7 x weekly - 2 sets - 10 reps - Half Deadlift with Kettlebell  - 1 x daily - 7 x weekly - 2 sets - 10 reps - Bird Dog with Knee Taps  - 1 x daily - 7 x weekly - 2 sets - 10 reps - Primal Push Up  - 1 x daily - 7 x weekly - 2 sets - 10 reps - Side Plank with Clam  - 1 x daily - 7 x weekly - 2 sets - 10 reps - Supine Butterfly Groin Stretch  - 1 x daily - 7 x weekly - 2 sets - hold - Child's Pose Stretch  - 1 x daily - 7 x weekly - 2 sets - hold - Supine Figure 4 Piriformis Stretch  - 1 x daily - 7 x weekly - 2 sets - hold - Kneeling Adductor Stretch with Hip External Rotation  - 1 x daily - 7 x weekly - 2 sets - hold - Half Kneel Dynamic Hamstrings and Contralateral Hip Flexor Stretch  - 1 x daily - 7 x weekly - 2 sets - hold  ASSESSMENT:  CLINICAL IMPRESSION: Patient is a 68 y.o. female  who was seen today for physical therapy treatment for pelvic pain during intercourse. Today we discussed use of a vibrational tool for pelvic floor musculature relaxation during intercourse and she plans to continue using this. Core training progressed in quadruped and side lying position today with no difficulty. Overall, patient tolerated session well and Pt would benefit from additional PT to further address deficits.    OBJECTIVE IMPAIRMENTS: decreased coordination, decreased endurance, decreased mobility, decreased ROM, decreased strength, and pain.   ACTIVITY LIMITATIONS: vaginal penetration during sexual intercourse  PARTICIPATION LIMITATIONS: pelvic examinations  PERSONAL FACTORS: Age, Past/current experiences, and Time since onset of injury/illness/exacerbation are also affecting patient's  functional outcome.   REHAB POTENTIAL: Good  CLINICAL DECISION MAKING: Stable/uncomplicated  EVALUATION COMPLEXITY: Low   GOALS: Goals reviewed with patient? Yes  SHORT TERM GOALS: Target date: 01/23/2024  Pt will be independent with HEP.  Baseline: Goal status: INITIAL  2.  Pt will be independent with diaphragmatic breathing and down training activities in order to improve pelvic floor relaxation. Baseline:  Goal status: INITIAL  3.  Pt will report 50% reduction of pain during vaginal penetration due to improvements in posture, strength, and muscle length to allow patient to participate in intercourse and gynecological visits to improve overall quality of life and intimate relationship with partner.  Baseline:  Goal status: INITIAL  LONG TERM GOALS: Target date: 06/27/2024   Pt will be independent with advanced HEP. Baseline:  Goal status: INITIAL  2.  Pt will report 0/10 pain with vaginal penetration in order to improve intimate relationship with partner and to allow patient to participate in medical examinations without pain being a limiting factor.  Baseline:  Goal status: INITIAL  3.  Pt to demonstrate improved coordination of pelvic floor and breathing mechanics with 10# squat with appropriate synergistic patterns to decrease pain at least 75% of the time for improved ability to complete a 30 minute workout with strain at pelvic floor and symptoms.    Baseline:  Goal status: INITIAL  4.  Pt will be independent with dilator program and progressions to decrease vaginal sensitization and return to pain free vaginal penetration to allow patient to participate in intercourse and gynecological examinations without pain. Baseline:  Goal status: INITIAL  PLAN:  PT FREQUENCY: 1-2x/week  PT DURATION: 6 months  PLANNED INTERVENTIONS: 97110-Therapeutic exercises, 97530- Therapeutic activity, 97112- Neuromuscular re-education, 97535- Self Care, [id number]- Manual therapy,  Patient/Family education, Taping, Dry Needling, Joint mobilization, Spinal mobilization, Scar mobilization, Cryotherapy, and Moist heat  PLAN FOR NEXT SESSION: internal vaginal treatment to decrease tension on left side of pelvic floor, introduce seated pelvic floor AROM and downtraining stretches for hips and glutes, trunk   Celena JAYSON Domino, PT 03/06/2024, 10:59 AM

## 2024-03-07 DIAGNOSIS — H25813 Combined forms of age-related cataract, bilateral: Secondary | ICD-10-CM | POA: Diagnosis not present

## 2024-03-07 DIAGNOSIS — H52223 Regular astigmatism, bilateral: Secondary | ICD-10-CM | POA: Diagnosis not present

## 2024-03-07 DIAGNOSIS — H524 Presbyopia: Secondary | ICD-10-CM | POA: Diagnosis not present

## 2024-03-07 DIAGNOSIS — H04123 Dry eye syndrome of bilateral lacrimal glands: Secondary | ICD-10-CM | POA: Diagnosis not present

## 2024-03-07 DIAGNOSIS — Z135 Encounter for screening for eye and ear disorders: Secondary | ICD-10-CM | POA: Diagnosis not present

## 2024-03-13 ENCOUNTER — Ambulatory Visit: Attending: Obstetrics & Gynecology | Admitting: Physical Therapy

## 2024-03-13 DIAGNOSIS — M6281 Muscle weakness (generalized): Secondary | ICD-10-CM | POA: Insufficient documentation

## 2024-03-13 DIAGNOSIS — R293 Abnormal posture: Secondary | ICD-10-CM | POA: Diagnosis not present

## 2024-03-13 NOTE — Therapy (Signed)
 OUTPATIENT PHYSICAL THERAPY FEMALE PELVIC DISCHARGE   Patient Name: Becky Bowers MRN: [id number] DOB:May 20, 1956, 68 y.o., female Today's Date: 03/13/2024  END OF SESSION:  PT End of Session - 03/13/24 1052     Visit Number 10    Number of Visits 10    Date for PT Re-Evaluation 06/27/24    Authorization Type [employer]    PT Start Time 1015    PT Stop Time 1055    PT Time Calculation (min) 40 min    Activity Tolerance Patient tolerated treatment well    Behavior During Therapy [facility] for tasks assessed/performed                   Past Medical History:  Diagnosis Date   Arthritis    Concussion 09/12/2019   Past Surgical History:  Procedure Laterality Date   APPENDECTOMY  1968   DILATION AND CURETTAGE OF UTERUS  1990   TONSILLECTOMY  1980   Patient Active Problem List   Diagnosis Date Noted   Numbness and tingling in left hand 02/08/2024   Osteoporosis 10/11/2023   Right hip pain 08/17/2023   Greater trochanteric bursitis, right 05/31/2023   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 02/03/2023   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   Elevated LDL cholesterol level 04/14/2021   Polyarthralgia 03/17/2021   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, right trigger thumb 04/24/2018   Numbness of right third and fourth toes 03/19/2018   Primary osteoarthritis of both hips 12/08/2015   Headache 08/12/2010   Lumbar degenerative disc disease 01/07/2008    PCP: Alvan Dorothyann BIRCH, MD  REFERRING PROVIDER: Cris Burnard DEL, MD  REFERRING DIAG: R10.2 (ICD-10-CM) - Pelvic pain  THERAPY DIAG:  Muscle weakness (generalized)  Abnormal posture  Rationale for Evaluation and Treatment: Rehabilitation  ONSET DATE: within the last few years   SUBJECTIVE:                                                                                                                                                                                            SUBJECTIVE STATEMENT: Patient reports that she is doing well and she is amenable to discharge today. Felt good after last visit, no soreness or questions. She ordered vaginal moisturizer from [employer] and is waiting on that in the mail. 0/10 pain today. No urinary or bowel concerns to report.   From eval: Patient reports that intercourse is painful and not enjoyable at all. This started a few years ago and she reports no use of lubrication at home. She started using estradiol  and this  has helped some with the dryness.  Fluid intake: 64 oz water daily, 1 or 2 cups of coffee daily   PAIN:  Are you having pain? No NPRS scale: 0/10  PRECAUTIONS: None  RED FLAGS: None   WEIGHT BEARING RESTRICTIONS: No  FALLS:  Has patient fallen in last 6 months? No  OCCUPATION: retired   ACTIVITY LEVEL: walks daily, takes pilates 1x/wk (1 hour)   PLOF: Independent  PATIENT GOALS: decrease pain with vaginal intercourse   BOWEL MOVEMENT: Pain with bowel movement: No Type of bowel movement:Type (Bristol Stool Scale) 3, Frequency 3-4x/wk, Strain yes, and Splinting yes Fully empty rectum: Yes:   Leakage: No Pads: No Fiber supplement/laxative No  URINATION: Pain with urination: No Fully empty bladder: Yes:   Stream: Strong Urgency: Yes  Frequency: within normal limits, 1x/night  Leakage: none Pads: No  INTERCOURSE:  Ability to have vaginal penetration Yes  Pain with intercourse: Initial Penetration, During Penetration, and After Intercourse Dryness: Yes  Marinoff Scale: 3/3  PREGNANCY: Vaginal deliveries 1   PROLAPSE: None  OBJECTIVE:  Note: Objective measures were completed at Evaluation unless otherwise noted.  PATIENT SURVEYS:   PFIQ-7: 54 PFIQ-7: 0  COGNITION: Overall cognitive status: Within functional limits for tasks assessed     SENSATION: Light touch: Appears intact  LUMBAR SPECIAL TESTS:  Single leg stance test:  Positive  FUNCTIONAL TESTS:  Squat: bilateral dynamic knee valgus with loading, no significant lumbopelvic stiffness present    GAIT: Assistive device utilized: None Comments: mild trendelenburg gait pattern with ambulation   POSTURE: rounded shoulders, forward head, and increased thoracic kyphosis   LUMBARAROM/PROM:  A/PROM A/PROM  eval  Flexion Within normal limits   Extension 25% limited  Right lateral flexion Within normal limits   Left lateral flexion Within normal limits   Right rotation 25% limited   Left rotation 25% limited   (Blank rows = not tested)  LOWER EXTREMITY ROM: within functional limits   LOWER EXTREMITY MMT: 4/5 bilateral knees and hips grossly   PALPATION:   General: no significant tenderness to palpation of bilateral adductors or hip flexors   Pelvic Alignment: within normal limits   Abdominal: upper chest breathing and abdominal bracing at rest, decreased lower rib excursion with inhalation                 External Perineal Exam: mild dryness noted with sufficient clitoral hood mobility present                              Internal Pelvic Floor: patient demonstrates mild overactivity in left side of pelvic floor, both superficially and deep. She reports mild burning sensation with palpation of left levator ani group. Patient's pelvic floor is lacking active range of motion due to stiffness in the musculature. With inhalation, patient is able to actively lengthen her pelvic floor and shorten the musculature with exhalation. Following deep diaphragmatic breathing interventions, internal burning sensation decreased. No pain following today's examination.   Patient confirms identification and approves PT to assess internal pelvic floor and treatment Yes No emotional/communication barriers or cognitive limitation. Patient is motivated to learn. Patient understands and agrees with treatment goals and plan. PT explains patient will be examined in standing,  sitting, and lying down to see how their muscles and joints work. When they are ready, they will be asked to remove their underwear so PT can examine their perineum. The patient is also given the  option of providing their own chaperone as one is not provided in our facility. The patient also has the right and is explained the right to defer or refuse any part of the evaluation or treatment including the internal exam. With the patient's consent, PT will use one gloved finger to gently assess the muscles of the pelvic floor, seeing how well it contracts and relaxes and if there is muscle symmetry. After, the patient will get dressed and PT and patient will discuss exam findings and plan of care. PT and patient discuss plan of care, schedule, attendance policy and HEP activities.  PELVIC MMT:   MMT eval  Vaginal 5/5, 10 quick flicks, 10 second hold   Internal Anal Sphincter   External Anal Sphincter   Puborectalis   Diastasis Recti   (Blank rows = not tested)       TONE: Normal throughout superficial and deep pelvic floor   PROLAPSE: N/A  TODAY'S TREATMENT:                                                                                                                              DATE:   02/08/24:  Manual therapy  Internal vaginal recheck of pelvic floor and surrounding tissues  Hooklying diaphragmatic breathing + pelvic floor lengthening and shortening for AROM assessment  Neuro re-ed: STANDING diaphragmatic breathing + pelvic floor lengthening with inhalation and shortening with exhalation 3x10  Seated pelvic tilt + diaphragmatic breathing 2x68min  Bird dog knee taps  + diaphragmatic breathing 2x10 alternating sides  Standing half clam with foot on wall + RTB + diaphragmatic breathing 2x10  Therapeutic exercise: Single leg RDL holding 10# KB + diaphragmatic breathing 2x10  Double leg deadlift holding 10# KB + diaphragmatic breathing 2x10  Self care: Education surrounding topical  estrogen cream and its benefits: clitoral hood mobility, muscle bulking   02/28/24:  Manual therapy  Internal vaginal recheck of pelvic floor and surrounding tissues  Hooklying diaphragmatic breathing + pelvic floor lengthening and shortening for AROM assessment  Neuro re-ed: Diaphragmatic breathing + pelvic floor lengthening 2x5 breaths internally each side  Standing half clam with foot on wall + RTB + diaphragmatic breathing 2x10  Self care: Education surrounding topical estrogen cream and its benefits: clitoral hood mobility, muscle bulking  Discussion of vibrator during intercourse to promote relaxation, blood flow, and pleasure   03/06/24:  Neuro re-ed: Bird dog knee taps holding 2# dumbbell + diaphragmatic breathing 2x10 each side  Primal push up + diaphragmatic breathing 2x10  Side plank + clamshell + diaphragmatic breathing 2x10 each side  Self care: Education surrounding topical estrogen cream and its benefits: clitoral hood mobility, muscle bulking  Discussion of vibrator during intercourse to promote relaxation, blood flow, and pleasure How to use a vibrational tool to promote pelvic floor musculature relaxation during intercourse and where to place it   03/13/24: discharge Self care/neuro reed: Education surrounding topical estrogen cream and its benefits: clitoral hood  mobility, muscle bulking  Discussion of vibrator during intercourse to promote relaxation, blood flow, and pleasure How to use a vibrational tool to promote pelvic floor musculature relaxation during intercourse and where to place it   How to maintain progress after pelvic PT ends  Toileting mechanics and techniques for complete emptying   PATIENT EDUCATION:  Education details: Relative anatomy and the connection between the diaphragm and pelvic floor, how lubrication can affect pelvic pain during intercourse and vaginal moisture level education  Person educated: Patient Education method: Explanation,  Demonstration, Tactile cues, Verbal cues, and Handouts Education comprehension: verbalized understanding, returned demonstration, verbal cues required, tactile cues required, and needs further education  HOME EXERCISE PROGRAM: Access Code: [email or web address] URL: [email or web address] Date: 03/06/2024 Prepared by: Celena Domino  Exercises - Standing Pelvic Floor Contraction  - 1 x daily - 7 x weekly - 1 sets - 10 reps - Seated Pelvic Tilt  - 1 x daily - 7 x weekly - 2 sets - 10 reps - Squat with Chair Touch and Resistance Loop  - 1 x daily - 7 x weekly - 2 sets - 10 reps - Standing Clam with Resistance Loop  - 1 x daily - 7 x weekly - 2 sets - 10 reps - Single Leg Deadlift with Kettlebell  - 1 x daily - 7 x weekly - 2 sets - 10 reps - Half Deadlift with Kettlebell  - 1 x daily - 7 x weekly - 2 sets - 10 reps - Bird Dog with Knee Taps  - 1 x daily - 7 x weekly - 2 sets - 10 reps - Primal Push Up  - 1 x daily - 7 x weekly - 2 sets - 10 reps - Side Plank with Clam  - 1 x daily - 7 x weekly - 2 sets - 10 reps - Supine Butterfly Groin Stretch  - 1 x daily - 7 x weekly - 2 sets - hold - Child's Pose Stretch  - 1 x daily - 7 x weekly - 2 sets - hold - Supine Figure 4 Piriformis Stretch  - 1 x daily - 7 x weekly - 2 sets - hold - Kneeling Adductor Stretch with Hip External Rotation  - 1 x daily - 7 x weekly - 2 sets - hold - Half Kneel Dynamic Hamstrings and Contralateral Hip Flexor Stretch  - 1 x daily - 7 x weekly - 2 sets - hold  ASSESSMENT:  CLINICAL IMPRESSION: Patient is a 68 y.o. female  who was seen today for physical therapy discharge for pelvic pain during intercourse. She has met all goals and is appropriate for discharge at this time. She no longer experiences constipation, pelvic pain, or pain during intercourse. Her nighttime urinary frequency has also improved. Patient is appropriate for discharge.  OBJECTIVE IMPAIRMENTS: decreased coordination,  decreased endurance, decreased mobility, decreased ROM, decreased strength, and pain.   ACTIVITY LIMITATIONS: vaginal penetration during sexual intercourse  PARTICIPATION LIMITATIONS: pelvic examinations  PERSONAL FACTORS: Age, Past/current experiences, and Time since onset of injury/illness/exacerbation are also affecting patient's functional outcome.   REHAB POTENTIAL: Good  CLINICAL DECISION MAKING: Stable/uncomplicated  EVALUATION COMPLEXITY: Low   GOALS: Goals reviewed with patient? Yes  SHORT TERM GOALS: Target date: 01/23/2024  Pt will be independent with HEP.  Baseline: Goal status: GOAL MET 03/13/24  2.  Pt will be independent with diaphragmatic breathing and down training activities in order to improve pelvic floor relaxation. Baseline:  Goal  status: GOAL MET 03/13/24  3.  Pt will report 50% reduction of pain during vaginal penetration due to improvements in posture, strength, and muscle length to allow patient to participate in intercourse and gynecological visits to improve overall quality of life and intimate relationship with partner.  Baseline:  Goal status: GOAL MET 03/13/24  LONG TERM GOALS: Target date: 06/27/2024   Pt will be independent with advanced HEP. Baseline:  Goal status: GOAL MET 03/13/24  2.  Pt will report 0/10 pain with vaginal penetration in order to improve intimate relationship with partner and to allow patient to participate in medical examinations without pain being a limiting factor.  Baseline:  Goal status: GOAL MET 03/13/24  3.  Pt to demonstrate improved coordination of pelvic floor and breathing mechanics with 10# squat with appropriate synergistic patterns to decrease pain at least 75% of the time for improved ability to complete a 30 minute workout with strain at pelvic floor and symptoms.    Baseline:  Goal status: GOAL MET 03/13/24  4.  Pt will be independent with dilator program and progressions to decrease vaginal sensitization and  return to pain free vaginal penetration to allow patient to participate in intercourse and gynecological examinations without pain. Baseline:  Goal status: GOAL MET 03/13/24   PHYSICAL THERAPY DISCHARGE SUMMARY  Visits from Start of Care: 10  Current functional level related to goals / functional outcomes: See above   Remaining deficits: See above   Education / Equipment: See above   Patient agrees to discharge. Patient goals were met. Patient is being discharged due to meeting the stated rehab goals.   Celena JAYSON Domino, PT 03/13/2024, 10:52 AM

## 2024-03-13 NOTE — Patient Instructions (Signed)
 End of pelvic PT summary:  Intercourse: Foreplay  Lubricant  Vibration  Breathing to relax your pelvic floor  Exercises: To maintain the progress you've made in pelvic PT, try to dedicate 10-15 minutes 1-2x/week to paying attention to your pelvic floor  You can pick and choose from the many exercises we've done in the past - if you need a go-to exercise, revisit the breathing one that we have kept consistent for weeks  Toileting Techniques for Bowel Movements An Evacuation/Defecation Plan   Here are the 4 basic points: Lean forward enough for your elbows to rest on your knees Support your feet on the floor or use a low stool if your feet don't touch the floor  Push out your belly as if you have swallowed a beach ball--you should feel a widening of your waist. Belly Big, Belly Hard Open and relax your pelvic floor muscles, rather than tightening around the anus While you are sitting on the toilet pay attention to the following areas: Jaw and mouth position- relaxed not clenched Angle of your hips - leaning slightly forward Whether your feet touch the ground or not - should be flat and supported Arm placement - rest against your thighs Spine position - flat back Waist Breathing - exhale as you push (like blowing up a balloon or try using other sounds such as ahhhh, shhhhh, ohhhh or grrrrrrr) Russella - hard and tight as you push Anus (opening of the anal canal) - relaxed and open as you push Anus - Tighten and lift pulling the muscle back in after you are done or if taking a break If you are not successful after 10-15 minutes, try again later.  Avoid negative self-talk about your toileting experience. Read this for more details and ask your PT if you need suggestions for adjustments or limitations: Sitting on the toilet  a) Make sure your feet are supported - flat on the floor or step stool b) Many people find it effective to lean forward or raise their knees.  Propping your feet on a step  stool (Squatty Potty is a brand name) can help the muscles around the anus to relax  c) When you lean forward, place your forearms on your thighs for support Relaxing Breathe deeply and slowly in through your nose and out through your mouth. To become aware of how to relax your muscles, contracting and releasing muscles can be helpful.  Pull your pelvic floor muscles in tightly by using the image of holding back gas, or closing around the anus (visualize making a circle smaller) and lifting the anus up and in.  Then release the muscles and your anus should drop down and feel open. Repeat 5 times ending with the feeling of relaxation. Keep your pelvic floor muscles relaxed; let your belly bulge out. The digestive tract starts at the mouth and ends at the anal opening, so be sure to relax both ends of the tube.  Place your tongue on the roof of your mouth with your teeth separated.  This helps relax your mouth and will help to relax the anus at the same time. Emptying (defecation) a) Keep your pelvic floor and sphincter relaxed, then bulge your anal muscles.  Make the anal opening wide.  b) Stick your belly out as if you have swallowed a beach ball. c) Make your belly wall hard using your belly muscles while continuing to breathe. Doing this makes it easier to open your anus. d) Breath out and give a grunt (  or try using other sounds such as ahhhh, shhhhh, ohhhh or grrrrrrr). e)  Can also try to act as if you are blowing up a balloon as you push 4) Finishing a) As you finish your bowel movement, pull the pelvic floor muscles up and in.  This will leave your anus in the proper place rather than remaining pushed out and down. If you leave your anus pushed out and down, it will start to feel as though that is normal and give you incorrect signals about needing to have a bowel movement.

## 2024-03-16 ENCOUNTER — Encounter: Payer: Self-pay | Admitting: Sports Medicine

## 2024-03-21 ENCOUNTER — Ambulatory Visit (INDEPENDENT_AMBULATORY_CARE_PROVIDER_SITE_OTHER): Admitting: Sports Medicine

## 2024-03-21 ENCOUNTER — Other Ambulatory Visit (INDEPENDENT_AMBULATORY_CARE_PROVIDER_SITE_OTHER)

## 2024-03-21 DIAGNOSIS — M1811 Unilateral primary osteoarthritis of first carpometacarpal joint, right hand: Secondary | ICD-10-CM | POA: Diagnosis not present

## 2024-03-21 DIAGNOSIS — M48061 Spinal stenosis, lumbar region without neurogenic claudication: Secondary | ICD-10-CM

## 2024-03-21 MED ORDER — TRIAMCINOLONE ACETONIDE 40 MG/ML IJ SUSP
20.0000 mg | Freq: Once | INTRAMUSCULAR | Status: AC
Start: 2024-03-21 — End: 2024-03-21
  Administered 2024-03-21: 20 mg via INTRA_ARTICULAR

## 2024-03-21 NOTE — Assessment & Plan Note (Signed)
 Pain right thumb basal joint, her last first [facility] injection was last done October 2024. Repeated today, return as needed.

## 2024-03-21 NOTE — Progress Notes (Signed)
    Procedures performed today:    Procedure: Real-time Ultrasound Guided injection of the right first [facility] Device: Samsung HS60  Verbal informed consent obtained.  Time-out conducted.  Noted no overlying erythema, induration, or other signs of local infection.  Skin prepped in a sterile fashion.  Local anesthesia: Topical Ethyl chloride.  With sterile technique and under real time ultrasound guidance: Arthritic joint noted, 0.5 cc lidocaine , 0.5 cc kenalog  40 injected easily. Advised to call if fevers/chills, erythema, induration, drainage, or persistent bleeding.  Images permanently stored and available for review in PACS.  Impression: Technically successful ultrasound guided injection.  Independent interpretation of notes and tests performed by another provider:   None.  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 Repeated today, return as needed.  Lumbar spinal stenosis Pleasant 68 year old female, long history of buttock and lateral thigh pain mostly left-sided, worse with twisting, walking. She has been treated extensively for ischiogluteal bursitis and proximal hamstring tendinosis, all confirmed with imaging. Ultimately L-spine MRI did show fairly severe spinal stenosis L4-L5 with spondylolisthesis. Gabapentin  is not sufficiently efficacious, she has done physical therapy. We will proceed with an L4-L5 interlaminar epidural on the left, then I would like consultation with Dr. Beuford regarding potential future decompression. With her spondylolisthesis I did inform her that it may necessitate a fusion.    ____________________________________________ Debby PARAS. Curtis, M.D., ABFM., CAQSM., AME. Primary Care and Sports Medicine [facility]  Adjunct Professor of  [facility]  [employer]  Restaurant manager, fast food

## 2024-03-21 NOTE — Addendum Note (Signed)
 Addended by: OLEY CHIQUITA CROME on: 03/21/2024 04:09 PM   Modules accepted: Orders

## 2024-03-21 NOTE — Assessment & Plan Note (Signed)
 Pleasant 68 year old female, long history of buttock and lateral thigh pain mostly left-sided, worse with twisting, walking. She has been treated extensively for ischiogluteal bursitis and proximal hamstring tendinosis, all confirmed with imaging. Ultimately L-spine MRI did show fairly severe spinal stenosis L4-L5 with spondylolisthesis. Gabapentin is not sufficiently efficacious, she has done physical therapy. We will proceed with an L4-L5 interlaminar epidural on the left, then I would like consultation with Dr. Beuford regarding potential future decompression. With her spondylolisthesis I did inform her that it may necessitate a fusion.

## 2024-03-26 ENCOUNTER — Encounter: Payer: Self-pay | Admitting: Sports Medicine

## 2024-03-29 NOTE — Discharge Instructions (Signed)

## 2024-04-01 ENCOUNTER — Ambulatory Visit
Admission: RE | Admit: 2024-04-01 | Discharge: 2024-04-01 | Disposition: A | Discharge status: Home / Self Care | Source: Ambulatory Visit | Attending: Sports Medicine | Admitting: Sports Medicine

## 2024-04-01 DIAGNOSIS — M48061 Spinal stenosis, lumbar region without neurogenic claudication: Secondary | ICD-10-CM

## 2024-04-01 DIAGNOSIS — M47817 Spondylosis without myelopathy or radiculopathy, lumbosacral region: Secondary | ICD-10-CM | POA: Diagnosis not present

## 2024-04-01 MED ORDER — IOPAMIDOL (ISOVUE-M 200) INJECTION 41%
1.0000 mL | Freq: Once | INTRAMUSCULAR | Status: AC
  Administered 2024-04-01: 1 mL via EPIDURAL

## 2024-04-01 MED ORDER — METHYLPREDNISOLONE ACETATE 40 MG/ML INJ SUSP (RADIOLOG
80.0000 mg | Freq: Once | INTRAMUSCULAR | Status: AC
  Administered 2024-04-01: 80 mg via EPIDURAL

## 2024-04-09 ENCOUNTER — Encounter: Payer: Self-pay | Admitting: Sports Medicine

## 2024-04-16 DIAGNOSIS — L821 Other seborrheic keratosis: Secondary | ICD-10-CM | POA: Diagnosis not present

## 2024-04-16 DIAGNOSIS — L579 Skin changes due to chronic exposure to nonionizing radiation, unspecified: Secondary | ICD-10-CM | POA: Diagnosis not present

## 2024-04-16 DIAGNOSIS — D235 Other benign neoplasm of skin of trunk: Secondary | ICD-10-CM | POA: Diagnosis not present

## 2024-04-16 DIAGNOSIS — D225 Melanocytic nevi of trunk: Secondary | ICD-10-CM | POA: Diagnosis not present

## 2024-04-16 DIAGNOSIS — L814 Other melanin hyperpigmentation: Secondary | ICD-10-CM | POA: Diagnosis not present

## 2024-05-10 ENCOUNTER — Ambulatory Visit: Admitting: Sports Medicine

## 2024-05-13 ENCOUNTER — Ambulatory Visit

## 2024-05-13 VITALS — BP 110/60 | Ht 63.0 in | Wt 125.0 lb

## 2024-05-13 DIAGNOSIS — M48061 Spinal stenosis, lumbar region without neurogenic claudication: Secondary | ICD-10-CM

## 2024-05-13 DIAGNOSIS — M76899 Other specified enthesopathies of unspecified lower limb, excluding foot: Secondary | ICD-10-CM

## 2024-05-13 DIAGNOSIS — S76311A Strain of muscle, fascia and tendon of the posterior muscle group at thigh level, right thigh, initial encounter: Secondary | ICD-10-CM

## 2024-05-13 NOTE — Progress Notes (Signed)
   Subjective:    Patient ID: Becky Bowers, female    DOB: 68 y.o., 1955-10-07   MRN: [id number]  HPI  Chief Complaint: Low back pain  Discussed the use of AI scribe software for clinical note transcription with the patient, who gave verbal consent to proceed.  History of Present Illness Becky Bowers is a 68 year old female who presents for follow-up after an epidural steroid injection for back pain.  Lumbar radicular pain - Significant back pain due to bulging discs at L3-L4 and L4-L5, more pronounced on the right side - MRI confirmed bulging discs at L3-L4 and L4-L5 - Epidural steroid injection performed six weeks ago - Initial response to injection: 90% improvement in pain for three weeks - Gradual return of pain over the past three weeks - No current shooting pain down the legs, which was present prior to the injection - No electric shock type pain  Neuropathic pain management - Gabapentin  used for pain control - Initially required six tablets daily - Reduced to three tablets daily following epidural injection - Increased back to six tablets daily as pain recurred  Hamstring injury and musculoskeletal discomfort - Hamstring tear occurred approximately 18 months ago - Completed physical therapy for hamstring injury - Continues to experience some discomfort despite ongoing exercises  Functional status and activity modification - Very active, participating in kayaking, Pilates, and various fitness classes - Adjusted exercise routine due to back pain and hamstring discomfort - Reduced walking regimen and modified participation in certain fitness classes       Objective:   Physical Exam Vitals:   05/13/24 1354  BP: 110/60   Const: appears well, non-toxic, well groomed Psych: affect bright, interactive, smiling EENT: EOMI intact, conjunctiva appear normal Neck: no obvious masses, appears symmetric Resp: non-labored, appears symmetric Neuro: muscle bulk  appears normal Skin: no obvious rashes noted Lumbar Spine -Inspection: no swelling or skin changes -Palpation: TTP - midline, - paraspinals -AROM/PROM: FROM in all planes of the low back -Special tests: - Straight Leg Raise, - Stork, - Slump test     Assessment & Plan:   Assessment & Plan 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 A previous epidural steroid injection provided significant relief for three weeks, but symptoms have gradually returned. She currently experiences no radicular symptoms or significant pain with movement, though a dull ache persists. She is active and prefers to avoid surgery. Up to six epidural steroid injections can be administered, but surgery may be considered if relief is not achieved after three. Order an epidural steroid injection at the same level as the previous injection, likely on the right side. Encourage continuation of physical activity, including walking and Pilates, as tolerated. Allow an increase in gabapentin  if it provides symptom relief, ensuring it does not cause excessive drowsiness.  Chronic right hamstring pain post-tear   Chronic right hamstring pain persists following a tear approximately 1.5 years ago, despite physical therapy and ongoing exercises. She remains active, engaging in various physical activities, including Pilates and fitness classes. Emphasize the importance of continuing exercises and modifications to avoid exacerbating pain. Encourage continuation of the current exercise regimen, including Pilates and fitness classes, with modifications as needed. Consider a home exercise program targeting the back and hamstring if needed.

## 2024-05-15 ENCOUNTER — Encounter: Payer: Self-pay | Admitting: Family Medicine

## 2024-05-22 NOTE — Discharge Instructions (Signed)

## 2024-05-23 ENCOUNTER — Ambulatory Visit
Admission: RE | Admit: 2024-05-23 | Discharge: 2024-05-23 | Disposition: A | Discharge status: Home / Self Care | Source: Ambulatory Visit

## 2024-05-23 DIAGNOSIS — M48061 Spinal stenosis, lumbar region without neurogenic claudication: Secondary | ICD-10-CM

## 2024-05-23 MED ORDER — METHYLPREDNISOLONE ACETATE 40 MG/ML INJ SUSP (RADIOLOG
80.0000 mg | Freq: Once | INTRAMUSCULAR | Status: AC
  Administered 2024-05-23: 80 mg via EPIDURAL

## 2024-05-23 MED ORDER — IOPAMIDOL (ISOVUE-M 200) INJECTION 41%
1.0000 mL | Freq: Once | INTRAMUSCULAR | Status: AC
  Administered 2024-05-23: 1 mL via EPIDURAL

## 2024-06-04 ENCOUNTER — Encounter: Payer: Self-pay | Admitting: Family Medicine

## 2024-06-20 ENCOUNTER — Encounter: Payer: Self-pay | Admitting: Family Medicine

## 2024-06-20 DIAGNOSIS — B009 Herpesviral infection, unspecified: Secondary | ICD-10-CM

## 2024-06-20 MED ORDER — VALACYCLOVIR HCL 1 G PO TABS: 1000.0000 mg | ORAL_TABLET | ORAL | 3 refills | Status: AC | PRN

## 2024-06-24 ENCOUNTER — Ambulatory Visit (HOSPITAL_BASED_OUTPATIENT_CLINIC_OR_DEPARTMENT_OTHER)
Admission: RE | Admit: 2024-06-24 | Discharge: 2024-06-24 | Disposition: A | Discharge status: Home / Self Care | Source: Ambulatory Visit

## 2024-06-24 ENCOUNTER — Ambulatory Visit

## 2024-06-24 ENCOUNTER — Other Ambulatory Visit: Payer: Self-pay

## 2024-06-24 VITALS — BP 110/60 | Ht 63.0 in | Wt 125.0 lb

## 2024-06-24 DIAGNOSIS — M76899 Other specified enthesopathies of unspecified lower limb, excluding foot: Secondary | ICD-10-CM

## 2024-06-24 DIAGNOSIS — S76311A Strain of muscle, fascia and tendon of the posterior muscle group at thigh level, right thigh, initial encounter: Secondary | ICD-10-CM

## 2024-06-24 DIAGNOSIS — M48061 Spinal stenosis, lumbar region without neurogenic claudication: Secondary | ICD-10-CM

## 2024-06-24 DIAGNOSIS — G8929 Other chronic pain: Secondary | ICD-10-CM | POA: Diagnosis not present

## 2024-06-24 DIAGNOSIS — M25511 Pain in right shoulder: Secondary | ICD-10-CM | POA: Diagnosis not present

## 2024-06-24 DIAGNOSIS — M19011 Primary osteoarthritis, right shoulder: Secondary | ICD-10-CM | POA: Diagnosis not present

## 2024-06-24 NOTE — Progress Notes (Unsigned)
   Subjective:    Patient ID: Becky Bowers, female    DOB: 69 y.o., March 21, 1956   MRN: [id number]  Chief Complaint: Right hip pain, back pain, right shoulder pain  Discussed the use of AI scribe software for clinical note transcription with the patient, who gave verbal consent to proceed.  History of Present Illness  Right shoulder pain Patient reporting pain in her right shoulder which worsens significantly when weather got much cooler last week Was present for several days but dissipated spontaneously. Has not experienced something similar to this in the past with weather change only Has had some on and off pain in right shoulder. 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  No recurrence since. No numbness or tingling radiating down the arm  Right hip pain Patient complaining of 2 foci of pain in her right hip. First is present in the lateral hip Next is present in the posterior hip. Hamstring injury s/p prior tear with ischiogluteal bursitis seen on MRI earlier 2025.  Has been engaged with PT for close to 2 years Still quite bothersome when ascending stairs or bending forward to pick up things up off the ground.  Low back pain Severe L4-L5 spinal stenosis visualized well on previous MRI imaging. Feels that she is approximately 50% improved in her back from how she was prior to epidural steroid injections      Objective:   Vitals:   06/24/24 1351  BP: 110/60   Const: appears well, non-toxic, well groomed Psych: affect bright, interactive, smiling EENT: EOMI intact, conjunctiva appear normal Neck: no obvious masses, appears symmetric Resp: non-labored, appears symmetric Neuro: muscle bulk appears normal Skin: no obvious rashes noted   Limited ultrasound evaluation of the posterior right hip: There is calcification present in the proximal hamstring tendons near their insertion onto the ischial tuberosity. There is also  mild thickening of the hamstring tendons in this region with mild increased Doppler uptake. Tenderness to sono palpation in this area. Interpretation: Proximal right hamstring tendinopathy     Assessment & Plan:   Assessment & Plan Becky Bowers is a pleasant 68 year old female presenting with 3 distinct areas of pain.  After discussion with her I do recommend proceeding with the prior plan she had under the management of Dr. Curtis to discuss with a spinal surgeon if her relatively focal spinal stenosis would make her a good candidate for surgical intervention.  She agrees to this.  With regard to her shoulder, I believe that obtaining x-rays which have not been previously obtained could help elucidate if severe arthritis is a primary contributor for her.  She agrees to this as well.  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  I recommend initiating prolotherapy for her proximal hamstring on the right, and we can consider shockwave or prolotherapy for her greater trochanter.  She is assiduous with home exercises and I encouraged her to continue these.  Follow-up for prolotherapy of right hamstring insertion.  Greater than 40 minutes were spent in face-to-face interaction with the patient discussing her condition, exploring potential treatment options, conducting physical exam, reviewing prior imaging, and discussing her ultrasound findings.

## 2024-06-25 ENCOUNTER — Ambulatory Visit

## 2024-06-25 ENCOUNTER — Other Ambulatory Visit: Payer: Self-pay

## 2024-06-25 ENCOUNTER — Ambulatory Visit: Payer: Self-pay

## 2024-06-25 VITALS — BP 102/74 | Ht 63.0 in | Wt 125.0 lb

## 2024-06-25 DIAGNOSIS — M76899 Other specified enthesopathies of unspecified lower limb, excluding foot: Secondary | ICD-10-CM

## 2024-06-25 DIAGNOSIS — S76311A Strain of muscle, fascia and tendon of the posterior muscle group at thigh level, right thigh, initial encounter: Secondary | ICD-10-CM | POA: Diagnosis not present

## 2024-06-25 MED ORDER — METHYLPREDNISOLONE ACETATE 40 MG/ML IJ SUSP
40.0000 mg | Freq: Once | INTRAMUSCULAR | Status: AC
  Administered 2024-06-25: 10 mg via INTRA_ARTICULAR

## 2024-06-25 NOTE — Progress Notes (Signed)
   Subjective:    Patient ID: Becky Bowers, female    DOB: 68 y.o., 1955-11-07   MRN: [id number]  Chief Complaint: Right hamstring tendinopathy (prolotherapy injection #1)  Discussed the use of AI scribe software for clinical note transcription with the patient, who gave verbal consent to proceed.  History of Present Illness  Patient presents for prolotherapy injection #1 Reporting pain worse with ascending stairs or bending forward to pick things up.     Objective:   Vitals:   06/25/24 1001  BP: 102/74    Right hamstring origin prolotherapy Injection with Ultrasound Guidance Procedure Note STEPHANYE FINNICUM 1956/04/04 Indications: Pain Procedure Details Following the description of risks including infection, bleeding, and damage to surrounding structures patient provided written consent for Right hamstring   injection with ultrasound guidance. Ultrasound was used to identify the right hamstring origin. The patient was then prepped in the usual fashion with chlorhexidine. Following topical anesthetization with ethyl chloride, the patient was injected into the right hamstring origin peppered into several areas including 1 where a visualized calcification was present.  This was done with a solution of Depo-Medrol , bupivacaine, hypertonic dextrose. This was well visualized under ultrasound, please see associated photographic documentation. Patient tolerated well without complication. Precautions provided. Cleaned and dressing applied.      Assessment & Plan:   Assessment & Plan  Patient tolerated procedure well.  Repeat injection in 2 weeks for prolotherapy treatment #2.  Consider injection of ischial bursa given enlargement visualized on ultrasound today.

## 2024-07-09 ENCOUNTER — Ambulatory Visit

## 2024-07-09 ENCOUNTER — Other Ambulatory Visit: Payer: Self-pay

## 2024-07-09 VITALS — BP 102/68 | Ht 63.0 in | Wt 125.0 lb

## 2024-07-09 DIAGNOSIS — M67951 Unspecified disorder of synovium and tendon, right thigh: Secondary | ICD-10-CM

## 2024-07-09 DIAGNOSIS — M76899 Other specified enthesopathies of unspecified lower limb, excluding foot: Secondary | ICD-10-CM

## 2024-07-09 DIAGNOSIS — S76311A Strain of muscle, fascia and tendon of the posterior muscle group at thigh level, right thigh, initial encounter: Secondary | ICD-10-CM

## 2024-07-09 MED ORDER — METHYLPREDNISOLONE ACETATE 40 MG/ML IJ SUSP
40.0000 mg | Freq: Once | INTRAMUSCULAR | Status: AC
  Administered 2024-07-09: 10 mg via INTRA_ARTICULAR

## 2024-07-09 NOTE — Progress Notes (Signed)
   Subjective:    Patient ID: Becky Bowers, female    DOB: 68 y.o., 11-May-1956   MRN: [id number]  Chief Complaint: Right hamstring tendinopathy (prolotherapy injection #2)  History of Present Illness  Patient presents for prolotherapy injection #2 Reporting sensation that she is sitting on top of something has a diminished since last visit     Objective:    Right gluteus medius tendon prolotherapy Injection with Ultrasound Guidance Procedure Note Becky Bowers 08/29/1955 Indications: Pain Procedure Details Following the description of risks including infection, bleeding, and damage to surrounding structures patient provided written consent for Right gluteus medius injection with ultrasound guidance. Ultrasound was used to identify the right gluteus medius. The patient was then prepped in the usual fashion with chlorhexidine. Following topical anesthetization with ethyl chloride, the patient was injected into the right gluteus medius peppered into several areas including 1 where a visualized calcification was present.  This was done with a solution of Depo-Medrol , bupivacaine, hypertonic dextrose. This was well visualized under ultrasound, please see associated photographic documentation. Patient tolerated well without complication. Precautions provided. Cleaned and dressing applied.     Assessment & Plan:   Assessment & Plan  Reports to me today that she has some pain actually migrating further laterally toward the gluteus medius tendon.  While ultrasounding this area there actually isa focal calcification deep to the gluteus medius tendon, and some mild evidence of tendinosis in the gluteus medius tendon.  We changed plan and provided her with a gluteus medius tendon prolotherapy injection instead of a hamstring insertion tendon due to her reported location of pain.  Given that there is some evidence of true bursitis around her greater trochanter on ultrasound today, I also feel that  proceeding with a corticosteroid injection in her greater trochanter will be helpful for her. Follow-up in 2 weeks for repeat prolotherapy injection in either hamstring insertion or gluteal tendon (probably hamstring insertion), and corticosteroid injection in greater trochanteric bursa.

## 2024-07-12 DIAGNOSIS — M5416 Radiculopathy, lumbar region: Secondary | ICD-10-CM | POA: Diagnosis not present

## 2024-07-21 ENCOUNTER — Encounter: Payer: Self-pay | Admitting: Obstetrics & Gynecology

## 2024-07-21 ENCOUNTER — Other Ambulatory Visit: Payer: Self-pay | Admitting: Obstetrics & Gynecology

## 2024-07-22 ENCOUNTER — Other Ambulatory Visit: Payer: Self-pay

## 2024-07-22 DIAGNOSIS — R232 Flushing: Secondary | ICD-10-CM

## 2024-07-22 MED ORDER — ESTRADIOL 10 MCG VA TABS: ORAL_TABLET | VAGINAL | 6 refills | Status: AC

## 2024-07-22 MED ORDER — ESTRADIOL 10 MCG VA TABS: ORAL_TABLET | VAGINAL | 6 refills | Status: DC

## 2024-07-22 NOTE — Addendum Note (Signed)
 Addended by: ORLINDA SILVANO ORN on: 07/22/2024 04:54 PM   Modules accepted: Orders

## 2024-07-23 ENCOUNTER — Ambulatory Visit

## 2024-07-23 ENCOUNTER — Other Ambulatory Visit: Payer: Self-pay

## 2024-07-23 VITALS — Ht 63.0 in | Wt 125.0 lb

## 2024-07-23 DIAGNOSIS — M7071 Other bursitis of hip, right hip: Secondary | ICD-10-CM | POA: Diagnosis not present

## 2024-07-23 DIAGNOSIS — M67951 Unspecified disorder of synovium and tendon, right thigh: Secondary | ICD-10-CM | POA: Diagnosis not present

## 2024-07-23 DIAGNOSIS — S76311A Strain of muscle, fascia and tendon of the posterior muscle group at thigh level, right thigh, initial encounter: Secondary | ICD-10-CM | POA: Diagnosis not present

## 2024-07-23 MED ORDER — METHYLPREDNISOLONE ACETATE 40 MG/ML IJ SUSP
40.0000 mg | Freq: Once | INTRAMUSCULAR | Status: AC
  Administered 2024-07-23: 15:00:00 40 mg via INTRA_ARTICULAR

## 2024-07-23 NOTE — Addendum Note (Signed)
 Addended by: GEORGINA GIOVANNA BROCKS on: 07/23/2024 03:06 PM   Modules accepted: Orders

## 2024-07-23 NOTE — Progress Notes (Signed)
 Subjective:    Patient ID: Becky Bowers, female    DOB: 68 y.o., 03/07/1956   MRN: [id number]  Chief Complaint: Right hip pain  Discussed the use of AI scribe software for clinical note transcription with the patient, who gave verbal consent to proceed.  History of Present Illness Becky Bowers is a 68 year old female with chronic right-sided hamstring tendinopathy, gluteal tendinosis, and hip bursitis who presents with persistent right hip, gluteal, and proximal hamstring pain.  Right gluteal and lateral hip pain - Severe, sharp, and shooting pain radiating from the lateral hip into the gluteal region and wrapping around the side - Pain worsened over the past weekend with increased tenderness and sensation of swelling - Pain exacerbated by laughing and coughing - Brief relief with slouching or walking hunched over - Deep headache sensation in the area - Unable to twist or perform certain movements due to pain - No significant midline low back pain or radiation down the leg  Right proximal hamstring pain - Chronic pain localized to the right proximal hamstring region - MRI demonstrated chronic partial tear of the right hamstring tendon - Pain and tenderness have prevented exercise for nearly a week  Functional limitations - Difficulty getting in and out of bed - Concerned about ability to participate in planned holiday family activities at the beach due to pain and functional limitations  Prior treatments and response - Completed physical therapy and strengthening without surgery or targeted hamstring injections - Received prolotherapy injections for gluteal tendon; considering additional injections for symptom control - Uses topical diclofenac and heat with limited benefit - Muscle relaxant has improved sleep only - Stopped celecoxib  several weeks ago when starting prolotherapy; has noticed more generalized pain since discontinuation - Not using gabapentin  or  other pain medications; celecoxib  available at home  Upcoming surgical intervention - Scheduled for lumbar fusion at L4-L5 and L5-S1 on January 21 - Concerned that ongoing gluteal and hamstring pain will limit postoperative recovery   01/2023 Right hip MRI 1. Right hamstring tendinosis with a full-thickness minimally retracted tear of the biceps femoris tendon origin with peritendinous edema. 2. Moderate volume fluid within the right ischiogluteal bursa. 3. Mild tendinosis of the contralateral left hamstring origin. 4. Mild tendinosis of the bilateral gluteus medius and minimus tendons without tear. 5. Mild osteoarthritis of the right hip.  10/2023 Lumbar spine MRI 1. Severe right and moderate left foraminal stenosis at L3-L4 secondary to disc bulging facet arthropathy. Mild canal stenosis at this level. 2. Moderate right and mild left foraminal stenosis at L4-L5 secondary to disc bulging.    Objective:   There were no vitals filed for this visit.  Ischiogluteal Bursa Injection with Ultrasound Guidance Procedure Note Becky Bowers 1956-04-01 Indications: Pain Procedure Details Following the description of risks including infection bleeding, damage to surrounding structures, patient provided written consent for right ischiogluteal bursa injection procedure. US  was used to identify the right ischiogluteal bursa and associated tendinous insertions. Patient was sterilely prepped in the usual fashion with chlorhexidine.  Following topical anesthetization with ethyl chloride, patient was injected with a solution of 30mg  Depo-medrol , 3ml mepivacaine, 1.25ml sodium bicarbonate 4.2%.  This was well visualized under ultrasound, please see associated photographic documentation. Patient tolerated well without complication. Precautions provided. Cleaned and dressing applied.  Right gluteus medius tendon prolotherapy Injection with Ultrasound Guidance Procedure Note Becky Bowers [birth year] Indications: Pain Procedure Details Following the description of risks including infection, bleeding, and damage to  surrounding structures patient provided written consent for Right gluteus medius injection with ultrasound guidance. Ultrasound was used to identify the right gluteus medius. The patient was then prepped in the usual fashion with chlorhexidine. Following topical anesthetization with ethyl chloride, the patient was injected into the right gluteus medius peppered into several areas including 1 where a visualized calcification was present.  This was done with a solution of Depo-Medrol , bupivacaine, hypertonic dextrose. This was well visualized under ultrasound, please see associated photographic documentation. Patient tolerated well without complication. Precautions provided. Cleaned and dressing applied.      Assessment & Plan:   Assessment & Plan Right greater trochanteric bursitis Administered a steroid injection to the ischial gluteal bursa for focal inflammation and pain control. Recommended resumption of Celecoxib  for its anti-inflammatory effect, considering her upcoming lumbar fusion and the need for improved function prior to rehabilitation. A follow-up is planned in two weeks to reassess response and consider further interventions.  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 It remains tender and symptomatic without radicular symptoms or nerve involvement. A prolotherapy injection at the hamstring origin is planned at the next visit in two weeks to complete the series and promote healing. Surgical intervention is avoided, focusing on conservative and regenerative therapies. Advised topical diclofenac gel and heat for symptomatic relief, acknowledging limited efficacy for deep muscle pain.  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

## 2024-07-30 ENCOUNTER — Other Ambulatory Visit: Payer: Self-pay | Admitting: Orthopedic Surgery

## 2024-08-06 NOTE — Progress Notes (Unsigned)
 "  Subjective:    Patient ID: Becky Bowers, female    DOB: 68 y.o., August 18, 1955   MRN: [id number]  Chief Complaint: Right ischiogluteal bursitis (2-week follow-up)  History of Present Illness Becky Bowers is a 68 year old female with chronic right hamstring tendinopathy, gluteal tendinosis, ischiogluteal bursitis, and new right adductor tendinopathy who presents for follow-up on the aforementioned.  Review of recent injectable therapies: Ischiogluteal bursa - steroid injection x1 (2wks ago) Right gluteus medius - prolotherapy x 2 Right hamstring origin - prolotherapy x 1  Right adductor region paresthesia - Intermittent, non-painful tingling in the right inner thigh/adductor region for the past two weeks - Most noticeable while sitting and occasionally with getting in and out of a car - Each episode lasts a few minutes - No clear triggers - No radiation distally - No treatments attempted - No numbness, tingling, or abnormal sensations radiating down the right leg  Right ischial tuberosity, gluteal, and hamstring pain - Persistent discomfort at the right ischial tuberosity, described as sitting on a little lump whenever seated - Ongoing pain and tightness in the posterior buttock and proximal hamstring - No meaningful improvement since anesthetic and steroid injection two weeks ago - No numbness, tingling, or abnormal sensations radiating down the right leg  Left lower extremity neuropathy - Numbness and tingling in the left leg attributed to lumbar disc disease  Analgesic use - Daily celecoxib  provides good relief of pain and stiffness - Briefly discontinued for one week in preparation for upcoming lumbar surgery, resulting in increased pain - Currently avoiding celecoxib  before surgery per instructions and has not taken it today  Therapeutic exercise and activity - Rehabilitation exercises have been inconsistent over the past week due to travel and family  obligations, including carrying her 25-pound grandson - Only a Pilates session completed this morning; usual exercise routine not maintained - Therapeutic exercises decrease pain and stiffness - Increased musculoskeletal discomfort when exercise routine is inconsistent     Objective:   Vitals:   08/07/24 1321  BP: 110/68   Right hip: Tenderness to palpation of the ischial tuberosity and along the hamstring musculature. Tenderness palpation in the region of the adductor magnus along its length. Minimal to no pain with resisted hamstring contraction both with hip neutral and slightly extended. Single-leg squat yields mild-moderate medial movement of the knee.  Extracorporeal Shockwave Therapy Procedure - #1 Following the description of risks including pain, bruising, local skin irritation, damage to surrounding structures, patient provided verbal/written consent for ESWT procedure. Palpation was used to identify the right hamstring tendon. Patient and probe was sterilely prepped in the usual fashion with alcohol.  Total strikes: 2000 Intensity: 100 Frequency: 12  Patient tolerated well without complication. Precautions provided.     Assessment & Plan:   Assessment & Plan Right ischiogluteal bursitis Chronic right ischiogluteal bursitis presents with persistent focal tenderness and discomfort, especially when sitting. Previous steroid injection did not significantly improve symptoms, and prolotherapy was discontinued due to ineffectiveness. Shockwave therapy is recommended as a more suitable treatment, with improvement expected around three weeks after starting. Risks are minimal, and it does not interfere with upcoming lumbar surgery or require stopping anti-inflammatory medications. Prolotherapy was discontinued, and the first session of shockwave therapy was performed during the visit. She is advised to continue therapeutic exercises as tolerated until lumbar surgery.  Right  hamstring tendinopathy with partial tear Chronic right hamstring tendinopathy with a partial tear causes ongoing posterior buttock and proximal hamstring  pain. Symptoms have not resolved with prior conservative measures or prolotherapy. Shockwave therapy is the next step, expected to be beneficial when combined with continued therapeutic exercise. Risks are minimal, and therapy will not interfere with planned lumbar surgery. The first session of shockwave therapy targeting the hamstring origin was performed during the visit. She is advised to continue therapeutic exercises as tolerated until lumbar surgery.  Right adductor tendinopathy New onset right adductor tendinopathy presents as intermittent, mild, tingly discomfort along the inner thigh, with focal tenderness on examination. The etiology is unclear but distinct from prior injection sites. Symptoms are mild and not consistently present. It is included in the shockwave therapy protocol for early intervention, initiated during the visit.   "

## 2024-08-07 ENCOUNTER — Ambulatory Visit

## 2024-08-07 VITALS — BP 110/68 | Ht 63.0 in | Wt 125.0 lb

## 2024-08-07 DIAGNOSIS — M7071 Other bursitis of hip, right hip: Secondary | ICD-10-CM | POA: Diagnosis not present

## 2024-08-07 DIAGNOSIS — S76311A Strain of muscle, fascia and tendon of the posterior muscle group at thigh level, right thigh, initial encounter: Secondary | ICD-10-CM

## 2024-08-07 DIAGNOSIS — M67951 Unspecified disorder of synovium and tendon, right thigh: Secondary | ICD-10-CM

## 2024-08-07 DIAGNOSIS — M76899 Other specified enthesopathies of unspecified lower limb, excluding foot: Secondary | ICD-10-CM

## 2024-08-14 ENCOUNTER — Ambulatory Visit: Payer: Self-pay

## 2024-08-14 DIAGNOSIS — M7071 Other bursitis of hip, right hip: Secondary | ICD-10-CM

## 2024-08-14 DIAGNOSIS — M76899 Other specified enthesopathies of unspecified lower limb, excluding foot: Secondary | ICD-10-CM

## 2024-08-14 DIAGNOSIS — S76311A Strain of muscle, fascia and tendon of the posterior muscle group at thigh level, right thigh, initial encounter: Secondary | ICD-10-CM

## 2024-08-14 NOTE — Progress Notes (Signed)
" ° °  Subjective:    Patient ID: Becky Bowers, female    DOB: 69 y.o., 07/09/1956   MRN: [id number]  Chief Complaint: Right insertional hamstring tendinopathy - shockwave therapy #2  History of Present Illness Becky Bowers presents for shockwave therapy session #2 Did feel significant benefit for eval 5 days following initial shockwave treatment on 12/31, particularly in the sensation that she was sitting on something on that side. Improvement in pain has regressed somewhat since day 5.     Objective:   Extracorporeal Shockwave Therapy Procedure - #2 Following the description of risks including pain, bruising, local skin irritation, damage to surrounding structures, patient provided verbal/written consent for ESWT procedure. Palpation was used to identify the right hamstring tendon. Patient and probe was sterilely prepped in the usual fashion with alcohol.   Total strikes: 2000 Intensity: 185 Frequency: 10   Patient tolerated well without complication. Precautions provided.     Assessment & Plan:   Assessment & Plan Becky Bowers is a pleasant 69 year old here for shockwave therapy session #2.  She tolerated this well.  I counseled her specifically on holding off on stretching of the tendon until it is no longer weak.  Continue assiduous home exercise program.  Follow-up in 1 week for shockwave session #3.   "

## 2024-08-19 ENCOUNTER — Encounter: Payer: Self-pay | Admitting: Family Medicine

## 2024-08-19 DIAGNOSIS — J302 Other seasonal allergic rhinitis: Secondary | ICD-10-CM

## 2024-08-20 ENCOUNTER — Ambulatory Visit: Payer: Self-pay

## 2024-08-20 DIAGNOSIS — S76311A Strain of muscle, fascia and tendon of the posterior muscle group at thigh level, right thigh, initial encounter: Secondary | ICD-10-CM

## 2024-08-20 DIAGNOSIS — M76899 Other specified enthesopathies of unspecified lower limb, excluding foot: Secondary | ICD-10-CM

## 2024-08-20 MED ORDER — AZELASTINE-FLUTICASONE 137-50 MCG/ACT NA SUSP: 1.0000 | Freq: Two times a day (BID) | NASAL | 5 refills | Status: DC

## 2024-08-20 NOTE — Progress Notes (Signed)
" ° °  Subjective:    Patient ID: Becky Bowers, female    DOB: 69 y.o., [birth year]   MRN: [id number]  Chief Complaint: Right insertional hamstring tendinopathy - shockwave therapy #3  History of Present Illness Becky Bowers presents for shockwave therapy session #3 Felt beat up slightly after last shockwave treatment Does feel some improvement from baseline prior to shockwave, potentially around 10%    Objective:   Extracorporeal Shockwave Therapy Procedure - #3 Following the description of risks including pain, bruising, local skin irritation, damage to surrounding structures, patient provided verbal/written consent for ESWT procedure. Palpation was used to identify the right hamstring tendon. Patient and probe was sterilely prepped in the usual fashion with alcohol.   Total strikes: 2000 Intensity: 150 Frequency: 10   Patient tolerated well without complication. Precautions provided.     Assessment & Plan:   Assessment & Plan Becky Bowers is a pleasant 69 year old here for shockwave therapy session #3.  She tolerated this well.  Continue assiduous home exercise program.  Follow-up in 1 week for shockwave session #4 which will be last session prior to her planned spine surgery. "

## 2024-08-21 ENCOUNTER — Other Ambulatory Visit: Payer: Self-pay | Admitting: *Deleted

## 2024-08-21 NOTE — Progress Notes (Addendum)
 Surgical Instructions   Your procedure is scheduled on January 21. Report to [facility] Main Entrance A at 6:30 A.M., then check in with the Admitting office. Any questions or running late day of surgery: call [phone]  Questions prior to your surgery date: call [phone], Monday-Friday, 8am-4pm. If you experience any cold or flu symptoms such as cough, fever, chills, shortness of breath, etc. between now and your scheduled surgery, please notify us  at the above number.     Remember:  Do not eat after midnight the night before your surgery  You may drink clear liquids until 5:30 am the morning of your surgery.   Clear liquids allowed are: Water, Non-Citrus Juices (without pulp), Carbonated Beverages, Clear Tea (no milk, honey, etc.), Black Coffee Only (NO MILK, CREAM OR POWDERED CREAMER of any kind), and Gatorade. Patient Instructions  The night before surgery:  No food after midnight. ONLY clear liquids after midnight  The day of surgery (if you do NOT have diabetes):  Drink ONE (1) Pre-Surgery Clear Ensure by 5:30am the morning of surgery. Drink in one sitting. Do not sip.  This drink was given to you during your hospital  pre-op appointment visit.  Nothing else to drink after completing the  Pre-Surgery Clear Ensure.         If you have questions, please contact your surgeons office.    Take these medicines the morning of surgery with A SIP OF WATER  gabapentin  (NEURONTIN )   May take these medicines IF NEEDED: acetaminophen  (TYLENOL )  valACYclovir  (VALTREX )   One week prior to surgery, STOP taking any Aspirin (unless otherwise instructed by your surgeon) Aleve, Naproxen, Ibuprofen, Motrin, Advil, Goody's, BC's, all herbal medications, fish oil, and non-prescription vitamins.                     Do NOT Smoke (Tobacco/Vaping) for 24 hours prior to your procedure.  If you use a CPAP at night, you may bring your mask/headgear for your overnight stay.   You will  be asked to remove any contacts, glasses, piercing's, hearing aid's, dentures/partials prior to surgery. Please bring cases for these items if needed.    Your surgeon will determine if you are to be admitted or discharged the same day.  Patients discharged the day of surgery will not be allowed to drive home, and someone needs to stay with them for 24 hours.  SURGICAL WAITING ROOM VISITATION Patients may have no more than 2 support people in the waiting area - these visitors may rotate.   Pre-op nurse will coordinate an appropriate time for 2 ADULT support persons, who may not rotate, to accompany patient in pre-op.  Children under the age of 76 must have an adult with them who is not the patient and must remain in the main waiting area with an adult.  If the patient needs to stay at the hospital during part of their recovery, the visitor guidelines for inpatient rooms apply.  Please refer to the [facility] website for the visitor guidelines for any additional information.   If you received a COVID test during your pre-op visit  it is requested that you wear a mask when out in public, stay away from anyone that may not be feeling well and notify your surgeon if you develop symptoms. If you have been in contact with anyone that has tested positive in the last 10 days please notify you surgeon.      Pre-operative 4 CHG Bathing Instructions  You can play a key role in reducing the risk of infection after surgery. Your skin needs to be as free of germs as possible. You can reduce the number of germs on your skin by washing with CHG (chlorhexidine gluconate) soap before surgery. CHG is an antiseptic soap that kills germs and continues to kill germs even after washing.   DO NOT use if you have an allergy to chlorhexidine/CHG or antibacterial soaps. If your skin becomes reddened or irritated, stop using the CHG and notify one of our RNs at [phone].   Please shower with the CHG soap starting  4 days before surgery using the following schedule:     Please keep in mind the following:  DO NOT shave, including legs and underarms, starting the day of your first shower.   You may shave your face at any point before/day of surgery.  Place clean sheets on your bed the day you start using CHG soap. Use a clean washcloth (not used since being washed) for each shower. DO NOT sleep with pets once you start using the CHG.   CHG Shower Instructions:  Wash your face and private area with normal soap. If you choose to wash your hair, wash first with your normal shampoo.  After you use shampoo/soap, rinse your hair and body thoroughly to remove shampoo/soap residue.  Turn the water OFF and apply  bottle of CHG soap to a CLEAN washcloth.  Apply CHG soap ONLY FROM YOUR NECK DOWN TO YOUR TOES (washing for 3-5 minutes)  DO NOT use CHG soap on face, private areas, open wounds, or sores.  Pay special attention to the area where your surgery is being performed.  If you are having back surgery, having someone wash your back for you may be helpful. Wait 2 minutes after CHG soap is applied, then you may rinse off the CHG soap.  Pat dry with a clean towel  Put on clean clothes/pajamas   If you choose to wear lotion, please use ONLY the CHG-compatible lotions that are listed below.  Additional instructions for the day of surgery:  If you choose, you may shower the morning of surgery with an antibacterial soap.  DO NOT APPLY any lotions, deodorants, cologne, or perfumes.   Do not bring valuables to the hospital. [facility] is not responsible for any belongings/valuables. Do not wear nail polish, gel polish, artificial nails, or any other type of covering on natural nails (fingers and toes) Do not wear jewelry or makeup Put on clean/comfortable clothes.  Please brush your teeth.  Ask your nurse before applying any prescription medications to the skin.     CHG Compatible Lotions   Aveeno  Moisturizing lotion  Cetaphil Moisturizing Cream  Cetaphil Moisturizing Lotion  Clairol Herbal Essence Moisturizing Lotion, Dry Skin  Clairol Herbal Essence Moisturizing Lotion, Extra Dry Skin  Clairol Herbal Essence Moisturizing Lotion, Normal Skin  Curel Age Defying Therapeutic Moisturizing Lotion with Alpha Hydroxy  Curel Extreme Care Body Lotion  Curel Soothing Hands Moisturizing Hand Lotion  Curel Therapeutic Moisturizing Cream, Fragrance-Free  Curel Therapeutic Moisturizing Lotion, Fragrance-Free  Curel Therapeutic Moisturizing Lotion, Original Formula  Eucerin Daily Replenishing Lotion  Eucerin Dry Skin Therapy Plus Alpha Hydroxy Crme  Eucerin Dry Skin Therapy Plus Alpha Hydroxy Lotion  Eucerin Original Crme  Eucerin Original Lotion  Eucerin Plus Crme Eucerin Plus Lotion  Eucerin TriLipid Replenishing Lotion  Keri Anti-Bacterial Hand Lotion  Keri Deep Conditioning Original Lotion Dry Skin Formula Softly Scented  Keri Deep  Conditioning Original Lotion, Fragrance Free Sensitive Skin Formula  Keri Lotion Fast Absorbing Fragrance Free Sensitive Skin Formula  Keri Lotion Fast Absorbing Softly Scented Dry Skin Formula  Keri Original Lotion  Keri Skin Renewal Lotion Keri Silky Smooth Lotion  Keri Silky Smooth Sensitive Skin Lotion  Nivea Body Creamy Conditioning Oil  Nivea Body Extra Enriched Lotion  Nivea Body Original Lotion  Nivea Body Sheer Moisturizing Lotion Nivea Crme  Nivea Skin Firming Lotion  NutraDerm 30 Skin Lotion  NutraDerm Skin Lotion  NutraDerm Therapeutic Skin Cream  NutraDerm Therapeutic Skin Lotion  ProShield Protective Hand Cream  Provon moisturizing lotion  Please read over the following fact sheets that you were given.

## 2024-08-22 ENCOUNTER — Encounter (HOSPITAL_COMMUNITY): Payer: Self-pay

## 2024-08-22 ENCOUNTER — Encounter (HOSPITAL_COMMUNITY)
Admission: RE | Admit: 2024-08-22 | Discharge: 2024-08-22 | Disposition: A | Discharge status: Home / Self Care | Source: Ambulatory Visit | Attending: Orthopedic Surgery | Admitting: Orthopedic Surgery

## 2024-08-22 ENCOUNTER — Other Ambulatory Visit: Payer: Self-pay

## 2024-08-22 VITALS — BP 123/71 | HR 66 | Temp 98.2°F | Resp 18 | Ht 63.0 in | Wt 123.0 lb

## 2024-08-22 DIAGNOSIS — Z01818 Encounter for other preprocedural examination: Secondary | ICD-10-CM | POA: Diagnosis present

## 2024-08-22 DIAGNOSIS — Z01812 Encounter for preprocedural laboratory examination: Secondary | ICD-10-CM | POA: Diagnosis not present

## 2024-08-22 LAB — CBC
HCT: 39 % (ref 36.0–46.0)
Hemoglobin: 12.6 g/dL (ref 12.0–15.0)
MCH: 30.5 pg (ref 26.0–34.0)
MCHC: 32.3 g/dL (ref 30.0–36.0)
MCV: 94.4 fL (ref 80.0–100.0)
Platelets: 226 K/uL (ref 150–400)
RBC: 4.13 MIL/uL (ref 3.87–5.11)
RDW: 13.3 % (ref 11.5–15.5)
WBC: 6.2 K/uL (ref 4.0–10.5)
nRBC: 0 % (ref 0.0–0.2)

## 2024-08-22 LAB — TYPE AND SCREEN
ABO/RH(D): O POS
Antibody Screen: NEGATIVE

## 2024-08-22 LAB — SURGICAL PCR SCREEN
MRSA, PCR: NEGATIVE
Staphylococcus aureus: POSITIVE — AB

## 2024-08-22 NOTE — Progress Notes (Signed)
 PCP - Dorothyann Byars Cardiologist - denies  PPM/ICD - denies   Chest x-ray - NA EKG - NA Stress Test - NA ECHO - NA Cardiac Cath - NA   Sleep Study - NA  Fasting Blood Sugar - NA  Last dose of GLP1 agonist- denies   Blood Thinner Instructions: denies Aspirin Instructions: NA  ERAS Protcol - NPO + Ensure  COVID TEST- NA   Anesthesia review: no  Patient denies shortness of breath, fever, cough and chest pain at PAT appointment   All instructions explained to the patient, with a verbal understanding of the material. Patient agrees to go over the instructions while at home for a better understanding. The opportunity to ask questions was provided.

## 2024-08-27 ENCOUNTER — Ambulatory Visit: Payer: Self-pay

## 2024-08-27 DIAGNOSIS — M76899 Other specified enthesopathies of unspecified lower limb, excluding foot: Secondary | ICD-10-CM

## 2024-08-27 NOTE — Progress Notes (Signed)
" ° °  Subjective:    Patient ID: Becky Bowers, female    DOB: 69 y.o., 1955/09/20   MRN: [id number]  Chief Complaint: Right insertional hamstring tendinopathy - shockwave therapy #4  History of Present Illness Becky Bowers presents for shockwave therapy session #4 Felt much improved for majority of last week until the weekend when pain seemed to return Has surgery with Dr. Beuford scheduled for tomorrow. Feels ready to get it over with.     Objective:   Extracorporeal Shockwave Therapy Procedure - #4 Following the description of risks including pain, bruising, local skin irritation, damage to surrounding structures, patient provided verbal/written consent for ESWT procedure. Palpation was used to identify the right hamstring tendon. Patient and probe was sterilely prepped in the usual fashion with alcohol.   Total strikes: 2000 Intensity: 120 Frequency: 12   Patient tolerated well without complication. Precautions provided.     Assessment & Plan:   Assessment & Plan Becky Bowers is a pleasant 69 year old here for shockwave therapy session #4.  She tolerated this well.  Continue assiduous home exercise program and discussed askling exercises.  We will leave follow up open ended for her at this time given her impending spinal surgery. She may wish to discuss her right thumb which has become more swollen and painful recently. "

## 2024-08-28 ENCOUNTER — Ambulatory Visit (HOSPITAL_COMMUNITY)

## 2024-08-28 ENCOUNTER — Encounter (HOSPITAL_COMMUNITY): Payer: Self-pay | Admitting: Orthopedic Surgery

## 2024-08-28 ENCOUNTER — Ambulatory Visit (HOSPITAL_BASED_OUTPATIENT_CLINIC_OR_DEPARTMENT_OTHER): Payer: Self-pay

## 2024-08-28 ENCOUNTER — Ambulatory Visit (HOSPITAL_COMMUNITY): Admission: RE | Disposition: A | Payer: Self-pay | Source: Home / Self Care | Attending: Orthopedic Surgery

## 2024-08-28 ENCOUNTER — Encounter (HOSPITAL_COMMUNITY): Payer: Self-pay

## 2024-08-28 ENCOUNTER — Observation Stay (HOSPITAL_COMMUNITY)
Admission: RE | Admit: 2024-08-28 | Discharge: 2024-08-29 | Disposition: A | Discharge status: Home / Self Care | Attending: Orthopedic Surgery | Admitting: Orthopedic Surgery

## 2024-08-28 DIAGNOSIS — M4317 Spondylolisthesis, lumbosacral region: Secondary | ICD-10-CM | POA: Diagnosis not present

## 2024-08-28 DIAGNOSIS — M48061 Spinal stenosis, lumbar region without neurogenic claudication: Secondary | ICD-10-CM

## 2024-08-28 DIAGNOSIS — M5117 Intervertebral disc disorders with radiculopathy, lumbosacral region: Secondary | ICD-10-CM | POA: Diagnosis not present

## 2024-08-28 DIAGNOSIS — M79605 Pain in left leg: Secondary | ICD-10-CM | POA: Diagnosis present

## 2024-08-28 DIAGNOSIS — M48062 Spinal stenosis, lumbar region with neurogenic claudication: Secondary | ICD-10-CM | POA: Insufficient documentation

## 2024-08-28 DIAGNOSIS — M5416 Radiculopathy, lumbar region: Principal | ICD-10-CM | POA: Diagnosis present

## 2024-08-28 HISTORY — PX: TRANSFORAMINAL LUMBAR INTERBODY FUSION (TLIF) WITH PEDICLE SCREW FIXATION 2 LEVEL: SHX6142

## 2024-08-28 LAB — ABO/RH: ABO/RH(D): O POS

## 2024-08-28 MED ORDER — FENTANYL CITRATE (PF) 100 MCG/2ML IJ SOLN
INTRAMUSCULAR | Status: AC
  Filled 2024-08-28: qty 2

## 2024-08-28 MED ORDER — SODIUM CHLORIDE 0.9 % IV SOLN
250.0000 mL | INTRAVENOUS | Status: DC
  Administered 2024-08-28: 250 mL via INTRAVENOUS

## 2024-08-28 MED ORDER — ONDANSETRON HCL 4 MG PO TABS: 4.0000 mg | ORAL_TABLET | Freq: Four times a day (QID) | ORAL | Status: DC | PRN

## 2024-08-28 MED ORDER — METHOCARBAMOL 1000 MG/10ML IJ SOLN: 500.0000 mg | Freq: Four times a day (QID) | INTRAMUSCULAR | Status: DC | PRN

## 2024-08-28 MED ORDER — PROPOFOL 500 MG/50ML IV EMUL
INTRAVENOUS | Status: DC | PRN
  Administered 2024-08-28: 30 ug/kg/min via INTRAVENOUS

## 2024-08-28 MED ORDER — VITAMIN C 500 MG PO TABS: 1000.0000 mg | ORAL_TABLET | Freq: Every day | ORAL | Status: DC

## 2024-08-28 MED ORDER — CEFAZOLIN SODIUM-DEXTROSE 2-4 GM/100ML-% IV SOLN
2.0000 g | Freq: Three times a day (TID) | INTRAVENOUS | Status: AC
  Administered 2024-08-28 (×2): 2 g via INTRAVENOUS
  Filled 2024-08-28 (×2): qty 100

## 2024-08-28 MED ORDER — POTASSIUM CHLORIDE IN NACL 20-0.9 MEQ/L-% IV SOLN
INTRAVENOUS | Status: DC
  Filled 2024-08-28: qty 1000

## 2024-08-28 MED ORDER — BUPIVACAINE-EPINEPHRINE 0.25% -1:200000 IJ SOLN
INTRAMUSCULAR | Status: DC | PRN
  Administered 2024-08-28: 8 mL
  Administered 2024-08-28: 10 mL

## 2024-08-28 MED ORDER — MORPHINE SULFATE (PF) 2 MG/ML IV SOLN: 2.0000 mg | INTRAVENOUS | Status: DC | PRN

## 2024-08-28 MED ORDER — THROMBIN 20000 UNITS EX SOLR
CUTANEOUS | Status: AC
  Filled 2024-08-28: qty 20000

## 2024-08-28 MED ORDER — LIDOCAINE 2% (20 MG/ML) 5 ML SYRINGE
INTRAMUSCULAR | Status: AC
  Filled 2024-08-28: qty 5

## 2024-08-28 MED ORDER — SODIUM CHLORIDE 0.9% FLUSH: 3.0000 mL | INTRAVENOUS | Status: DC | PRN

## 2024-08-28 MED ORDER — PROPOFOL 1000 MG/100ML IV EMUL
INTRAVENOUS | Status: AC
  Filled 2024-08-28: qty 300

## 2024-08-28 MED ORDER — PHENOL 1.4 % MT LIQD: 1.0000 | OROMUCOSAL | Status: DC | PRN

## 2024-08-28 MED ORDER — HYDROCODONE-ACETAMINOPHEN 5-325 MG PO TABS
1.0000 | ORAL_TABLET | ORAL | Status: DC | PRN
  Administered 2024-08-28 – 2024-08-29 (×5): 2 via ORAL
  Filled 2024-08-28 (×5): qty 2

## 2024-08-28 MED ORDER — BISACODYL 5 MG PO TBEC: 5.0000 mg | DELAYED_RELEASE_TABLET | Freq: Every day | ORAL | Status: DC | PRN

## 2024-08-28 MED ORDER — CEFAZOLIN SODIUM-DEXTROSE 2-4 GM/100ML-% IV SOLN
2.0000 g | INTRAVENOUS | Status: AC
  Administered 2024-08-28: 2 g via INTRAVENOUS
  Filled 2024-08-28: qty 100

## 2024-08-28 MED ORDER — OXYCODONE HCL 5 MG/5ML PO SOLN: 5.0000 mg | Freq: Once | ORAL | Status: DC | PRN

## 2024-08-28 MED ORDER — PROPOFOL 10 MG/ML IV BOLUS
INTRAVENOUS | Status: DC | PRN
  Administered 2024-08-28: 90 mg via INTRAVENOUS

## 2024-08-28 MED ORDER — ACETAMINOPHEN 325 MG PO TABS: 650.0000 mg | ORAL_TABLET | ORAL | Status: DC | PRN

## 2024-08-28 MED ORDER — OXYCODONE HCL 5 MG PO TABS: 5.0000 mg | ORAL_TABLET | Freq: Once | ORAL | Status: DC | PRN

## 2024-08-28 MED ORDER — VALACYCLOVIR HCL 500 MG PO TABS: 1000.0000 mg | ORAL_TABLET | ORAL | Status: DC | PRN

## 2024-08-28 MED ORDER — ADULT MULTIVITAMIN W/MINERALS CH: 1.0000 | ORAL_TABLET | Freq: Every day | ORAL | Status: DC

## 2024-08-28 MED ORDER — ONE-A-DAY WOMENS 50+ ADVANTAGE PO TABS: ORAL_TABLET | Freq: Every day | ORAL | Status: DC

## 2024-08-28 MED ORDER — THROMBIN 5000 UNITS EX KIT
PACK | CUTANEOUS | Status: AC
  Filled 2024-08-28: qty 1

## 2024-08-28 MED ORDER — LACTATED RINGERS IV SOLN: INTRAVENOUS | Status: DC

## 2024-08-28 MED ORDER — ROCURONIUM BROMIDE 10 MG/ML (PF) SYRINGE
PREFILLED_SYRINGE | INTRAVENOUS | Status: DC | PRN
  Administered 2024-08-28: 20 mg via INTRAVENOUS
  Administered 2024-08-28: 10 mg via INTRAVENOUS
  Administered 2024-08-28: 60 mg via INTRAVENOUS
  Administered 2024-08-28 (×3): 20 mg via INTRAVENOUS

## 2024-08-28 MED ORDER — ACETAMINOPHEN 650 MG RE SUPP: 650.0000 mg | RECTAL | Status: DC | PRN

## 2024-08-28 MED ORDER — ROCURONIUM BROMIDE 10 MG/ML (PF) SYRINGE
PREFILLED_SYRINGE | INTRAVENOUS | Status: AC
  Filled 2024-08-28: qty 10

## 2024-08-28 MED ORDER — 0.9 % SODIUM CHLORIDE (POUR BTL) OPTIME
TOPICAL | Status: DC | PRN
  Administered 2024-08-28 (×2): 1000 mL

## 2024-08-28 MED ORDER — ONDANSETRON HCL 4 MG/2ML IJ SOLN
INTRAMUSCULAR | Status: AC
  Filled 2024-08-28: qty 2

## 2024-08-28 MED ORDER — ACETAMINOPHEN ER 650 MG PO TBCR: 650.0000 mg | EXTENDED_RELEASE_TABLET | Freq: Three times a day (TID) | ORAL | Status: DC | PRN

## 2024-08-28 MED ORDER — SUGAMMADEX SODIUM 200 MG/2ML IV SOLN: INTRAVENOUS | Status: DC | PRN

## 2024-08-28 MED ORDER — BUPIVACAINE-EPINEPHRINE (PF) 0.25% -1:200000 IJ SOLN
INTRAMUSCULAR | Status: AC
  Filled 2024-08-28: qty 30

## 2024-08-28 MED ORDER — DEXAMETHASONE SOD PHOSPHATE PF 10 MG/ML IJ SOLN
INTRAMUSCULAR | Status: AC
  Filled 2024-08-28: qty 1

## 2024-08-28 MED ORDER — FENTANYL CITRATE (PF) 100 MCG/2ML IJ SOLN
25.0000 ug | INTRAMUSCULAR | Status: DC | PRN
  Administered 2024-08-28: 25 ug via INTRAVENOUS

## 2024-08-28 MED ORDER — METHOCARBAMOL 1000 MG/10ML IJ SOLN
INTRAMUSCULAR | Status: AC
  Filled 2024-08-28: qty 10

## 2024-08-28 MED ORDER — SODIUM CHLORIDE 0.9% FLUSH
3.0000 mL | Freq: Two times a day (BID) | INTRAVENOUS | Status: DC
  Administered 2024-08-28 (×2): 3 mL via INTRAVENOUS

## 2024-08-28 MED ORDER — PHENYLEPHRINE HCL-NACL 20-0.9 MG/250ML-% IV SOLN
INTRAVENOUS | Status: AC
  Filled 2024-08-28: qty 500

## 2024-08-28 MED ORDER — THROMBIN 5000 UNITS EX SOLR
OROMUCOSAL | Status: DC | PRN
  Administered 2024-08-28: 5 mL via TOPICAL

## 2024-08-28 MED ORDER — CHLORHEXIDINE GLUCONATE 0.12 % MT SOLN
15.0000 mL | Freq: Once | OROMUCOSAL | Status: AC
  Administered 2024-08-28: 15 mL via OROMUCOSAL
  Filled 2024-08-28: qty 15

## 2024-08-28 MED ORDER — MIDAZOLAM HCL (PF) 2 MG/2ML IJ SOLN
INTRAMUSCULAR | Status: DC | PRN
  Administered 2024-08-28: 2 mg via INTRAVENOUS

## 2024-08-28 MED ORDER — OYSTER SHELL CALCIUM/D3 500-5 MG-MCG PO TABS
1.0000 | ORAL_TABLET | Freq: Two times a day (BID) | ORAL | Status: DC
  Administered 2024-08-28: 1 via ORAL
  Filled 2024-08-28: qty 1

## 2024-08-28 MED ORDER — FLEET ENEMA RE ENEM: 1.0000 | ENEMA | Freq: Once | RECTAL | Status: DC | PRN

## 2024-08-28 MED ORDER — ONDANSETRON HCL 4 MG/2ML IJ SOLN
4.0000 mg | Freq: Once | INTRAMUSCULAR | Status: AC | PRN
  Administered 2024-08-28: 4 mg via INTRAVENOUS

## 2024-08-28 MED ORDER — GABAPENTIN 300 MG PO CAPS
300.0000 mg | ORAL_CAPSULE | Freq: Three times a day (TID) | ORAL | Status: DC
  Administered 2024-08-28 (×2): 300 mg via ORAL
  Filled 2024-08-28 (×2): qty 1

## 2024-08-28 MED ORDER — DOCUSATE SODIUM 100 MG PO CAPS
100.0000 mg | ORAL_CAPSULE | Freq: Two times a day (BID) | ORAL | Status: DC
  Administered 2024-08-28 – 2024-08-29 (×3): 100 mg via ORAL
  Filled 2024-08-28 (×3): qty 1

## 2024-08-28 MED ORDER — ONDANSETRON HCL 4 MG/2ML IJ SOLN
INTRAMUSCULAR | Status: DC | PRN
  Administered 2024-08-28: 4 mg via INTRAVENOUS

## 2024-08-28 MED ORDER — ORAL CARE MOUTH RINSE: 15.0000 mL | Freq: Once | OROMUCOSAL | Status: AC

## 2024-08-28 MED ORDER — LIDOCAINE 2% (20 MG/ML) 5 ML SYRINGE
INTRAMUSCULAR | Status: DC | PRN
  Administered 2024-08-28: 50 mg via INTRAVENOUS

## 2024-08-28 MED ORDER — ZOLPIDEM TARTRATE 5 MG PO TABS: 5.0000 mg | ORAL_TABLET | Freq: Every evening | ORAL | Status: DC | PRN

## 2024-08-28 MED ORDER — ONDANSETRON HCL 4 MG/2ML IJ SOLN: 4.0000 mg | Freq: Four times a day (QID) | INTRAMUSCULAR | Status: DC | PRN

## 2024-08-28 MED ORDER — BUPIVACAINE LIPOSOME 1.3 % IJ SUSP
INTRAMUSCULAR | Status: AC
  Filled 2024-08-28: qty 20

## 2024-08-28 MED ORDER — CALCIUM CARBONATE-VITAMIN D 600-200 MG-UNIT PO TABS: 1.0000 | ORAL_TABLET | Freq: Two times a day (BID) | ORAL | Status: DC

## 2024-08-28 MED ORDER — BUPIVACAINE LIPOSOME 1.3 % IJ SUSP
INTRAMUSCULAR | Status: DC | PRN
  Administered 2024-08-28: 20 mL

## 2024-08-28 MED ORDER — KETAMINE HCL 50 MG/5ML IJ SOSY
PREFILLED_SYRINGE | INTRAMUSCULAR | Status: AC
  Filled 2024-08-28: qty 5

## 2024-08-28 MED ORDER — EPHEDRINE SULFATE-NACL 50-0.9 MG/10ML-% IV SOSY
PREFILLED_SYRINGE | INTRAVENOUS | Status: DC | PRN
  Administered 2024-08-28 (×4): 5 mg via INTRAVENOUS

## 2024-08-28 MED ORDER — EPHEDRINE 5 MG/ML INJ
INTRAVENOUS | Status: AC
  Filled 2024-08-28: qty 5

## 2024-08-28 MED ORDER — SENNOSIDES-DOCUSATE SODIUM 8.6-50 MG PO TABS: 1.0000 | ORAL_TABLET | Freq: Every evening | ORAL | Status: DC | PRN

## 2024-08-28 MED ORDER — MIDAZOLAM HCL 2 MG/2ML IJ SOLN
INTRAMUSCULAR | Status: AC
  Filled 2024-08-28: qty 2

## 2024-08-28 MED ORDER — ALUM & MAG HYDROXIDE-SIMETH 200-200-20 MG/5ML PO SUSP: 30.0000 mL | Freq: Four times a day (QID) | ORAL | Status: DC | PRN

## 2024-08-28 MED ORDER — MENTHOL 3 MG MT LOZG: 1.0000 | LOZENGE | OROMUCOSAL | Status: DC | PRN

## 2024-08-28 MED ORDER — OXYCODONE-ACETAMINOPHEN 5-325 MG PO TABS
1.0000 | ORAL_TABLET | ORAL | Status: DC | PRN
  Filled 2024-08-28: qty 2

## 2024-08-28 MED ORDER — ACETAMINOPHEN 10 MG/ML IV SOLN
1000.0000 mg | Freq: Once | INTRAVENOUS | Status: DC | PRN
  Administered 2024-08-28: 1000 mg via INTRAVENOUS

## 2024-08-28 MED ORDER — LACTATED RINGERS IV SOLN: INTRAVENOUS | Status: DC | PRN

## 2024-08-28 MED ORDER — METHOCARBAMOL 500 MG PO TABS
500.0000 mg | ORAL_TABLET | Freq: Four times a day (QID) | ORAL | Status: DC | PRN
  Administered 2024-08-28 – 2024-08-29 (×3): 500 mg via ORAL
  Filled 2024-08-28 (×3): qty 1

## 2024-08-28 MED ORDER — THROMBIN 20000 UNITS EX KIT
PACK | CUTANEOUS | Status: DC | PRN
  Administered 2024-08-28: 20 mL via TOPICAL

## 2024-08-28 MED ORDER — FENTANYL CITRATE (PF) 250 MCG/5ML IJ SOLN
INTRAMUSCULAR | Status: DC | PRN
  Administered 2024-08-28: 100 ug via INTRAVENOUS
  Administered 2024-08-28: 50 ug via INTRAVENOUS

## 2024-08-28 MED ORDER — METHOCARBAMOL 1000 MG/10ML IJ SOLN
INTRAMUSCULAR | Status: DC | PRN
  Administered 2024-08-28: 500 mg via INTRAVENOUS

## 2024-08-28 MED ORDER — PROPOFOL 10 MG/ML IV BOLUS
INTRAVENOUS | Status: AC
  Filled 2024-08-28: qty 20

## 2024-08-28 MED ORDER — SUGAMMADEX SODIUM 200 MG/2ML IV SOLN
INTRAVENOUS | Status: DC | PRN
  Administered 2024-08-28: 200 mg via INTRAVENOUS

## 2024-08-28 MED ORDER — ACETAMINOPHEN 10 MG/ML IV SOLN
INTRAVENOUS | Status: AC
  Filled 2024-08-28: qty 100

## 2024-08-28 MED ORDER — PHENYLEPHRINE HCL-NACL 20-0.9 MG/250ML-% IV SOLN
INTRAVENOUS | Status: DC | PRN
  Administered 2024-08-28: 30 ug/min via INTRAVENOUS

## 2024-08-28 MED ORDER — DEXAMETHASONE SOD PHOSPHATE PF 10 MG/ML IJ SOLN
INTRAMUSCULAR | Status: DC | PRN
  Administered 2024-08-28: 10 mg via INTRAVENOUS

## 2024-08-28 MED ORDER — KETAMINE HCL 10 MG/ML IJ SOLN
INTRAMUSCULAR | Status: DC | PRN
  Administered 2024-08-28: 25 mg via INTRAVENOUS

## 2024-08-28 MED ORDER — POVIDONE-IODINE 7.5 % EX SOLN
Freq: Once | CUTANEOUS | Status: DC
  Filled 2024-08-28: qty 118

## 2024-08-28 NOTE — Anesthesia Postprocedure Evaluation (Signed)
"   Anesthesia Post Note  Patient: Becky Bowers  Procedure(s) Performed: TRANSFORAMINAL LUMBAR INTERBODY FUSION (TLIF) WITH PEDICLE SCREW FIXATION 2 LEVEL (Left)     Patient location during evaluation: PACU Anesthesia Type: General Level of consciousness: awake and alert Pain management: pain level controlled Vital Signs Assessment: post-procedure vital signs reviewed and stable Respiratory status: spontaneous breathing, nonlabored ventilation, respiratory function stable and patient connected to nasal cannula oxygen Cardiovascular status: blood pressure returned to baseline and stable Postop Assessment: no apparent nausea or vomiting Anesthetic complications: no   There were no known notable events for this encounter.  Last Vitals:  Vitals:   08/28/24 1430 08/28/24 1507  BP: 100/71 (!) 96/55  Pulse: 74 66  Resp: 11 17  Temp:  36.6 C  SpO2: 91% 97%    Last Pain:  Vitals:   08/28/24 1430  TempSrc:   PainSc: 3                  Lynwood MARLA Cornea      "

## 2024-08-28 NOTE — Plan of Care (Signed)

## 2024-08-28 NOTE — H&P (Signed)
 "    PREOPERATIVE H&P  Chief Complaint: Left leg pain  HPI: Becky Bowers is a 69 y.o. female who presents with ongoing pain in the left leg  MRI reveals spinal stenosis and instability at L4/5 and L5/S1  Patient has failed multiple forms of conservative care and continues to have pain (see office notes for additional details regarding the patient's full course of treatment)  Past Medical History:  Diagnosis Date   Arthritis    Concussion 09/12/2019   Past Surgical History:  Procedure Laterality Date   APPENDECTOMY  1968   DILATION AND CURETTAGE OF UTERUS  1990   TONSILLECTOMY  1980   Social History   Socioeconomic History   Marital status: Married    Spouse name: Dallas.    Number of children: 1   Years of education: Not on file   Highest education level: Bachelor's degree (e.g., BA, AB, BS)  Occupational History   Occupation: Retired Designer, Television/film Set: [employer]  Tobacco Use   Smoking status: Never   Smokeless tobacco: Never  Vaping Use   Vaping status: Never Used  Substance and Sexual Activity   Alcohol use: Yes    Alcohol/week: 2.0 - 4.0 standard drinks of alcohol    Types: 2 - 4 Glasses of wine per week    Comment: 4-6 glasses of wine a week   Drug use: No   Sexual activity: Yes    Birth control/protection: Post-menopausal    Comment: retired runner, 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  Other Topics Concern   Not on file  Social History Narrative   1 Caffeine drink per day. 5 days per week exercising/walk 1 day yoga. Retired.     Social Drivers of Health   Tobacco Use: Low Risk (08/28/2024)   Patient History    Smoking Tobacco Use: Never    Smokeless Tobacco Use: Never    Passive Exposure: Not on file  Financial Resource Strain: Low Risk (02/05/2024)   Overall Financial Resource Strain (CARDIA)    Difficulty of Paying Living Expenses: Not hard at all  Food  Insecurity: No Food Insecurity (02/05/2024)   Epic    Worried About Programme Researcher, Broadcasting/film/video in the Last Year: Never true    Ran Out of Food in the Last Year: Never true  Transportation Needs: No Transportation Needs (02/05/2024)   Epic    Lack of Transportation (Medical): No    Lack of Transportation (Non-Medical): No  Physical Activity: Sufficiently Active (02/05/2024)   Exercise Vital Sign    Days of Exercise per Week: 4 days    Minutes of Exercise per Session: 40 min  Stress: No Stress Concern Present (02/05/2024)   [employer] of Occupational Health - Occupational Stress Questionnaire    Feeling of Stress: Not at all  Social Connections: Moderately Isolated (02/05/2024)   Social Connection and Isolation Panel    Frequency of Communication with Friends and Family: More than three times a week    Frequency of Social Gatherings with Friends and Family: Once a week    Attends Religious Services: Never    Database Administrator or Organizations: No    Attends Banker Meetings: Not on file    Marital Status: Married  Depression (PHQ2-9): Low Risk (05/23/2023)   Depression (PHQ2-9)    PHQ-2 Score: 0  Alcohol Screen: Low Risk (02/05/2024)   Alcohol Screen    Last Alcohol  Screening Score (AUDIT): 4  Housing: Low Risk (02/05/2024)   Epic    Unable to Pay for Housing in the Last Year: No    Number of Times Moved in the Last Year: 0    Homeless in the Last Year: No  Utilities: Not At Risk (05/23/2023)   [facility] Utilities    Threatened with loss of utilities: No  Health Literacy: Adequate Health Literacy (05/23/2023)   B1300 Health Literacy    Frequency of need for help with medical instructions: Never   Family History  Problem Relation Age of Onset   Heart attack Mother    Diabetes Father    Hypertension Father    Squamous cell carcinoma Father        skin    Atrial fibrillation Father    Hearing loss Father    Dementia Father    Diabetes Brother    Hypertension  Brother    Colon cancer Paternal Grandmother    Alzheimer's disease Paternal Grandmother    Alzheimer's disease Paternal Uncle    Allergies[1] Prior to Admission medications  Medication Sig Start Date End Date Taking? Authorizing Provider  acetaminophen  (TYLENOL ) 650 MG CR tablet Take 650 mg by mouth every 8 (eight) hours as needed for pain.   Yes [provider]  ascorbic acid (VITAMIN C ) 1000 MG tablet Take 1,000 mg by mouth daily.   Yes [provider]  Calcium  Carbonate-Vitamin D  600-200 MG-UNIT TABS Take 1 tablet by mouth 2 (two) times daily.   Yes [provider]  Estradiol  10 MCG TABS vaginal tablet 1 tablet per vagina daily Patient taking differently: Place 1 tablet vaginally 2 (two) times a week. 07/22/24  Yes Cris Burnard DEL, MD  gabapentin  (NEURONTIN ) 300 MG capsule Take 2 capsules (600 mg total) by mouth 3 (three) times daily. Patient taking differently: Take 300 mg by mouth 3 (three) times daily. 11/27/23  Yes Curtis Debby PARAS, MD  Multiple Vitamins-Minerals (ONE-A-DAY WOMENS 50+ ADVANTAGE PO) Take 1 capsule by mouth daily.   Yes [provider]  Pediatric Multivit-Minerals (FLINTSTONES + EXTRA IRON) CHEW Chew 1 each by mouth daily.   Yes [provider]  triamcinolone  ointment (KENALOG ) 0.1 % Apply 1 Application topically daily as needed (rash). 01/17/23  Yes [provider]  valACYclovir  (VALTREX ) 1000 MG tablet Take 1 tablet (1,000 mg total) by mouth as needed. Patient taking differently: Take 1,000 mg by mouth as needed (fever blisters). 06/20/24   Alvan Dorothyann BIRCH, MD     All other systems have been reviewed and were otherwise negative with the exception of those mentioned in the HPI and as above.  Physical Exam: Vitals:   08/28/24 0644  BP: (P) 122/70  Pulse: (P) 76  Resp: (P) 17  Temp: (P) 97.9 F (36.6 C)  SpO2: (P) 100%    Body mass index is 21.79 kg/m (pended).  General: Alert, no acute  distress Cardiovascular: No pedal edema Respiratory: No cyanosis, no use of accessory musculature Skin: No lesions in the area of chief complaint Neurologic: Sensation intact distally Psychiatric: Patient is competent for consent with normal mood and affect Lymphatic: No axillary or cervical lymphadenopathy   Assessment/Plan: LUMBAR RADICULOPATHY, STENOSIS AND INSTABILITY Plan for Procedures: TRANSFORAMINAL LUMBAR INTERBODY FUSION (TLIF) WITH PEDICLE SCREW FIXATION 2 LEVEL - L4/5 AND L5/S1   Oneil LITTIE Priestly, MD 08/28/2024 8:12 AM    [1] No Known Allergies  "

## 2024-08-28 NOTE — Anesthesia Preprocedure Evaluation (Signed)
"                                    Anesthesia Evaluation  Patient identified by MRN, date of birth, ID band Patient awake    Reviewed: Allergy & Precautions, NPO status , Patient's Chart, lab work & pertinent test results, reviewed documented beta blocker date and time   History of Anesthesia Complications (+) PONV and history of anesthetic complications  Airway Mallampati: III  TM Distance: >3 FB     Dental no notable dental hx.    Pulmonary neg COPD   breath sounds clear to auscultation       Cardiovascular (-) CAD and (-) Past MI  Rhythm:Regular Rate:Normal     Neuro/Psych  Headaches, neg Seizures  Neuromuscular disease    GI/Hepatic ,,,(+) neg Cirrhosis        Endo/Other    Renal/GU Renal disease     Musculoskeletal  (+) Arthritis , Osteoarthritis,    Abdominal   Peds  Hematology   Anesthesia Other Findings   Reproductive/Obstetrics                              Anesthesia Physical Anesthesia Plan  ASA: 2  Anesthesia Plan: General   Post-op Pain Management:    Induction: Intravenous  PONV Risk Score and Plan: 3 and Ondansetron , Dexamethasone  and Propofol  infusion  Airway Management Planned: Oral ETT  Additional Equipment:   Intra-op Plan:   Post-operative Plan: Extubation in OR  Informed Consent: I have reviewed the patients History and Physical, chart, labs and discussed the procedure including the risks, benefits and alternatives for the proposed anesthesia with the patient or authorized representative who has indicated his/her understanding and acceptance.     Dental advisory given  Plan Discussed with: CRNA  Anesthesia Plan Comments:          Anesthesia Quick Evaluation  "

## 2024-08-28 NOTE — Transfer of Care (Signed)
 Immediate Anesthesia Transfer of Care Note  Patient: Becky Bowers  Procedure(s) Performed: TRANSFORAMINAL LUMBAR INTERBODY FUSION (TLIF) WITH PEDICLE SCREW FIXATION 2 LEVEL (Left)  Patient Location: PACU  Anesthesia Type:General  Level of Consciousness: awake, alert , and oriented  Airway & Oxygen Therapy: Patient Spontanous Breathing and Patient connected to face mask oxygen  Post-op Assessment: Report given to RN, Post -op Vital signs reviewed and stable, and Patient moving all extremities  Post vital signs: Reviewed and stable  Last Vitals:  Vitals Value Taken Time  BP 125/73 08/28/24 12:30  Temp    Pulse 100 08/28/24 12:33  Resp 16 08/28/24 12:33  SpO2 94 % 08/28/24 12:33  Vitals shown include unfiled device data.  Last Pain:  Vitals:   08/28/24 0702  TempSrc:   PainSc: 0-No pain         Complications: There were no known notable events for this encounter.

## 2024-08-28 NOTE — Op Note (Signed)
 PATIENT NAME: Becky Bowers   MEDICAL RECORD NO.:   [id number]   DATE OF BIRTH: [birth year]   DATE OF PROCEDURE: 08/28/2024                                                            OPERATIVE REPORT     PREOPERATIVE DIAGNOSES: 1. Left-sided lumbar radiculopathy 2. L4/5, L5/S1 spinal stenosis 3. L4/5, L5/S1 spondylolisthesis 4. Severe degenerative disc disease, L4/5, L5/S1   POSTOPERATIVE DIAGNOSES: 1. Left-sided lumbar radiculopathy 2. L4/5, L5/S1 spinal stenosis 3. L4/5, L5/S1 spondylolisthesis 4. Severe degenerative disc disease, L4/5, L5/S1   PROCEDURES: 1. Decompression L4-5, L5-S1, including a bilateral partial facetectomy 2. Left-sided L4/5, L5/S1 transforaminal lumbar interbody fusion 3. Right-sided L4-5, L5/S1 posterolateral fusion 4. Insertion of interbody device x2 (Globus expandable intervertebral spacers) 5. Placement of posterior instrumentation L4, L5, S1 bilaterally 6. Use of local autograft 7. Use of morselized allograft - Vivigen 8. Intraoperative use of fluoroscopy   SURGEON:  Oneil Priestly, MD.   ASSISTANTBETHA Ileana Clara, PA-C.   ANESTHESIA:  General endotracheal anesthesia.   COMPLICATIONS:  None.   DISPOSITION:  Stable.   ESTIMATED BLOOD LOSS:  200cc   INDICATIONS FOR SURGERY:  Briefly,  Ms. Becky Bowers a pleasant 69 year-old female who did present to me with severe and ongoing pain in her left leg.  She gained no improvement with appropriate conservative treatment, and I did feel that her symptoms were secondary to the findings noted above. She did wish to proceed with the procedure noted above.   OPERATIVE DETAILS:  On  08/28/2024, the patient was brought to surgery and general endotracheal anesthesia was administered.  The patient was placed prone on a well-padded flat Jackson bed with a spinal frame.  Antibiotics were given and a time-out procedure was performed. The back was prepped and draped in the usual fashion.  A midline incision was  made overlying the L4-5 and L5-S1 intervertebral spaces.  The fascia was incised at the midline.  The paraspinal musculature was bluntly swept laterally.  Anatomic landmarks for the pedicles were exposed. Using fluoroscopy, I did cannulate the L4, L5, and S1 pedicles bilaterally, using a medial to lateral cortical trajectory technique at L4 and L5, and using a straight/medial trajectory technique at S1 bilaterally.  On the right side, the posterolateral gutter and right facet joint at L4-5 and L5-S1 was decorticated using a high-speed bur, in anticipation of the fusion, and 6 mm screws of the appropriate length were placed at L4 and L5, and a 6 mm screw was placed at S1 and a 60-mm rod was placed and distraction was applied across the rod at each intervertebral level.  On the left side, the cannulated pedicle holes were filled with bone wax.  I then proceeded with the decompressive aspect of the procedure.     Starting at L5-S1, I did perform a right sided partial facetectomy and a full facetectomy on the left.  The ligamentum flavum was also resected bilaterally.  This did decompress the lateral recess on the right and left sides. Once this was done, with an assistant holding medial retraction of the traversing left S1 nerve, I did perform a thorough and complete L5-S1 intervertebral discectomy.  The intervertebral space was then liberally packed with autograft from the decompression, as  well as allograft in the form of Vivigen, as was the appropriately sized intervertebral spacer.  The spacer was then tamped into position and expanded to 7.4 mm in height. Distraction was then released on the contralateral right side.  I then turned my attention to the L4-5 level.  Once again a partial facetectomy was performed on the right, and a full facetectomy was performed on the left.  This did decompress the right and left lateral recess, and left neuroforamen. With an assistant holding medial retraction of the  traversing left L5 nerve, I did perform an annulotomy at the posterolateral aspect of the L4-5 intervertebral space.  I then used a series of curettes and pituitary rongeurs to perform a thorough and complete intervertebral diskectomy.  The intervertebral space was then liberally packed with autograft as well as allograft in the form of Vivigen.  At this point, the intervertebral spacer was tamped into the disc space, and expanded to 7.8 mm in height. I was very pleased with the press-fit of the spacer.  I then placed 6 mm screws on the left at L4 and L5, and a 6 mm screw at S1 on the left.  A 55-mm rod was then placed and caps were placed. The distraction was then released on the contralateral side.  All 6 caps were then locked.  The wound was copiously irrigated with a total of approximately 3 L prior to placing the bone graft.  Additional autograft and allograft was then packed into the posterolateral gutter on the right side to help aid in the success of the posterolateral fusion. The wound was explored for any undue bleeding and there was no substantial bleeding encountered.  Gel-Foam was placed over the laminectomy site.  The wound was then closed in layers using #1 Vicryl followed by 2-0 Vicryl, followed by 4-0 Monocryl.  Benzoin and Steri-Strips were applied followed by sterile dressing.       Of note, Ileana Clara was my assistant throughout surgery, and did aid in retraction, placement of the hardware, the decompression, suctioning, and closure.    Oneil Priestly, MD

## 2024-08-28 NOTE — Anesthesia Procedure Notes (Signed)
 Procedure Name: Intubation Date/Time: 08/28/2024 8:48 AM  Performed by: Hedy Jarred, CRNAPre-anesthesia Checklist: Patient identified, Emergency Drugs available, Suction available and Patient being monitored Patient Re-evaluated:Patient Re-evaluated prior to induction Oxygen Delivery Method: Circle System Utilized Preoxygenation: Pre-oxygenation with 100% oxygen Induction Type: IV induction Ventilation: Mask ventilation without difficulty Laryngoscope Size: Mac and 3 Grade View: Grade I Tube type: Oral Tube size: 7.0 mm Number of attempts: 1 Airway Equipment and Method: Stylet Placement Confirmation: ETT inserted through vocal cords under direct vision, positive ETCO2 and breath sounds checked- equal and bilateral Secured at: 21 cm Tube secured with: Tape Dental Injury: Teeth and Oropharynx as per pre-operative assessment

## 2024-08-29 ENCOUNTER — Encounter (HOSPITAL_COMMUNITY): Payer: Self-pay | Admitting: Orthopedic Surgery

## 2024-08-29 ENCOUNTER — Telehealth: Payer: Self-pay | Admitting: Family Medicine

## 2024-08-29 DIAGNOSIS — J302 Other seasonal allergic rhinitis: Secondary | ICD-10-CM

## 2024-08-29 DIAGNOSIS — M5117 Intervertebral disc disorders with radiculopathy, lumbosacral region: Secondary | ICD-10-CM | POA: Diagnosis not present

## 2024-08-29 MED ORDER — MUPIROCIN 2 % EX OINT
1.0000 | TOPICAL_OINTMENT | Freq: Two times a day (BID) | CUTANEOUS | Status: DC
  Administered 2024-08-29: 1 via NASAL
  Filled 2024-08-29: qty 22

## 2024-08-29 MED ORDER — HYDROCODONE-ACETAMINOPHEN 5-325 MG PO TABS: 1.0000 | ORAL_TABLET | Freq: Four times a day (QID) | ORAL | 0 refills | Status: AC | PRN

## 2024-08-29 MED ORDER — METHOCARBAMOL 500 MG PO TABS: 500.0000 mg | ORAL_TABLET | Freq: Four times a day (QID) | ORAL | 2 refills | Status: AC | PRN

## 2024-08-29 MED ORDER — FLUTICASONE PROPIONATE 50 MCG/ACT NA SUSP: 1.0000 | Freq: Every day | NASAL | 6 refills | Status: AC

## 2024-08-29 MED ORDER — AZELASTINE-FLUTICASONE 137-50 MCG/ACT NA SUSP: 1.0000 | Freq: Two times a day (BID) | NASAL | 5 refills | Status: AC

## 2024-08-29 MED ORDER — OXYCODONE-ACETAMINOPHEN 5-325 MG PO TABS: 1.0000 | ORAL_TABLET | ORAL | 0 refills | Status: DC | PRN

## 2024-08-29 MED FILL — Thrombin For Soln 20000 Unit: CUTANEOUS | Qty: 1 | Status: AC

## 2024-08-29 NOTE — Progress Notes (Signed)
 PT Cancellation Note and Discharge  Patient Details Name: Becky Bowers MRN: [id number] DOB: [birth year]   Cancelled Treatment:    Reason Eval/Treat Not Completed: PT screened, no needs identified, will sign off. Discussed pt case with OT who reports pt is currently mobilizing without assistance and does not require a formal PT evaluation at this time. PT signing off. If needs change, please reconsult.     [name] 08/29/2024, 9:10 AM  [name], PT, DPT Acute Rehabilitation Services Secure Chat Preferred Office: [phone]

## 2024-08-29 NOTE — Progress Notes (Signed)
" ° ° °  Patient doing well  Has been ambulating Denies leg pain   Physical Exam: Vitals:   08/28/24 2248 08/29/24 0327  BP: (!) 102/56 (!) 97/50  Pulse: 64 68  Resp: 18 18  Temp: 98.2 F (36.8 C) 98.7 F (37.1 C)  SpO2: 100% 98%    Dressing in place NVI  POD #1 s/p L4/5 decompression and fusion, doing well  - up with PT/OT, encourage ambulation - Percocet for pain, Robaxin  for muscle spasms - d/c home today with f/u in 2 weeks "

## 2024-08-29 NOTE — Care Management Obs Status (Signed)
 MEDICARE OBSERVATION STATUS NOTIFICATION   Patient Details  Name: Becky Bowers MRN: [id number] Date of Birth: 23-Feb-1956   Medicare Observation Status Notification Given:  Yes    [name] 08/29/2024, 9:14 AM

## 2024-08-29 NOTE — Telephone Encounter (Signed)
 Patient is aware. Voiced her understanding

## 2024-08-29 NOTE — Evaluation (Signed)
 Occupational Therapy Evaluation Patient Details Name: Becky Bowers MRN: [id number] DOB: Oct 19, 1955 Today's Date: 08/29/2024   History of Present Illness   Pt is a 69 y/o female presenting on 08/28/24 for same day TLIF L4/5, L5/S1. PMH includes: arthritis, concussion.     Clinical Impressions Patient admitted for above and presents with problem list below.  PTA pt was independent. Patient was educated on brace mgmt and wear schedule, back precautions, ADL compensatory techniques, AE/DME, mobility progression, safety and recommendations.  Today, pt demonstrated ability to complete transfers  with modified independence and no AD, functional mobility with independence, and ADLs with independence.  At discharge, pt will have support from spouse as needed.  Based on performance today, no further OT needs identified.  OT will sign off. Thank you for this referral!        If plan is discharge home, recommend the following:   Assist for transportation;Assistance with cooking/housework     Functional Status Assessment         Equipment Recommendations   None recommended by OT     Recommendations for Other Services         Precautions/Restrictions   Precautions Precautions: Back Precaution Booklet Issued: Yes (comment) Recall of Precautions/Restrictions: Intact Precaution/Restrictions Comments: reviewed with pt Required Braces or Orthoses: Spinal Brace Spinal Brace: Thoracolumbosacral orthotic;Applied in sitting position Restrictions Weight Bearing Restrictions Per Provider Order: No     Mobility Bed Mobility               General bed mobility comments: OOB upon entry, reviewed technique with pt    Transfers Overall transfer level: Independent                        Balance Overall balance assessment: No apparent balance deficits (not formally assessed)                                         ADL either performed or assessed  with clinical judgement   ADL Overall ADL's : Modified independent                                       General ADL Comments: educated on back precautions and compensatory techniques.  able to complete figure 4 and manage ADls with modified independence.     Vision   Vision Assessment?: No apparent visual deficits     Perception         Praxis         Pertinent Vitals/Pain Pain Assessment Pain Assessment: Faces Faces Pain Scale: Hurts a little bit Pain Location: back- incisional Pain Descriptors / Indicators: Discomfort, Operative site guarding Pain Intervention(s): Limited activity within patient's tolerance, Monitored during session, Repositioned     Extremity/Trunk Assessment Upper Extremity Assessment Upper Extremity Assessment: Overall WFL for tasks assessed   Lower Extremity Assessment Lower Extremity Assessment: Defer to PT evaluation   Cervical / Trunk Assessment Cervical / Trunk Assessment: Back Surgery   Communication Communication Communication: No apparent difficulties   Cognition Arousal: Alert Behavior During Therapy: WFL for tasks assessed/performed Cognition: No apparent impairments                               Following commands:  Intact       Cueing  General Comments   Cueing Techniques: Verbal cues  spouse at side and supportive   Exercises     Shoulder Instructions      Home Living Family/patient expects to be discharged to:: Private residence Living Arrangements: Spouse/significant other Available Help at Discharge: Family;Available 24 hours/day Type of Home: House Home Access: Stairs to enter [employer] of Steps: 2 Entrance Stairs-Rails: None Home Layout: Two level;Full bath on main level;Able to live on main level with bedroom/bathroom Alternate Level Stairs-Number of Steps: flight with rails   Bathroom Shower/Tub: Walk-in shower   Bathroom Toilet: Handicapped height     Home  Equipment: Pharmacist, Hospital (2 wheels)          Prior Functioning/Environment Prior Level of Function : Independent/Modified Independent                    OT Problem List: Decreased activity tolerance;Pain;Decreased knowledge of precautions   OT Treatment/Interventions:        OT Goals(Current goals can be found in the care plan section)   Acute Rehab OT Goals Patient Stated Goal: home OT Goal Formulation: With patient   OT Frequency:       Co-evaluation              AM-PAC OT 6 Clicks Daily Activity     Outcome Measure Help from another person eating meals?: None Help from another person taking care of personal grooming?: None Help from another person toileting, which includes using toliet, bedpan, or urinal?: None Help from another person bathing (including washing, rinsing, drying)?: None Help from another person to put on and taking off regular upper body clothing?: None Help from another person to put on and taking off regular lower body clothing?: None 6 Click Score: 24   End of Session Equipment Utilized During Treatment: Back brace Nurse Communication: Mobility status  Activity Tolerance: Patient tolerated treatment well Patient left: in chair;with call bell/phone within reach;with family/visitor present  OT Visit Diagnosis: Pain Pain - part of body:  (back)                Time: 9166-9141 OT Time Calculation (min): 25 min Charges:  OT General Charges $OT Visit: 1 Visit OT Evaluation $OT Eval Low Complexity: 1 Low  Becky Bowers, OT Acute Rehabilitation Services Office [phone] Secure Chat Preferred   Becky Bowers [place] 08/29/2024, 9:36 AM

## 2024-08-29 NOTE — Progress Notes (Signed)
 Patient alert and oriented, ambulated, voided. Surgical site clean and dry no sign of infection. D/c instructions explain and given all questions answered.

## 2024-08-29 NOTE — Telephone Encounter (Signed)
 Pls call pt and let her know her insurance would not cover the combination nasal spray so the pharmacy gave us  the notifications that we sent over the nasal spray as 2 separate prescriptions.

## 2024-09-04 MED FILL — Sodium Chloride IV Soln 0.9%: INTRAVENOUS | Qty: 2000 | Status: AC

## 2024-09-04 MED FILL — Heparin Sodium (Porcine) Inj 1000 Unit/ML: INTRAMUSCULAR | Qty: 30 | Status: AC

## 2024-09-04 NOTE — Discharge Summary (Signed)
 "   Patient ID: Becky Bowers MRN: [id number] DOB/AGE: May 14, 1956 69 y.o.  Admit date: 08/28/2024 Discharge date: 08/29/2024  Admission Diagnoses:  Principal Problem:   Radiculopathy of lumbar region   Discharge Diagnoses:  Same  Past Medical History:  Diagnosis Date   Arthritis    Concussion 09/12/2019    Surgeries: Procedures: TRANSFORAMINAL LUMBAR INTERBODY FUSION (TLIF) WITH PEDICLE SCREW FIXATION 2 LEVEL on 08/28/2024   Consultants: None  Discharged Condition: Improved  Hospital Course: Becky Bowers is an 69 y.o. female who was admitted 08/28/2024 for operative treatment of Radiculopathy of lumbar region. Patient has severe unremitting pain that affects sleep, daily activities, and work/hobbies. After pre-op clearance the patient was taken to the operating room on 08/28/2024 and underwent  Procedures: TRANSFORAMINAL LUMBAR INTERBODY FUSION (TLIF) WITH PEDICLE SCREW FIXATION 2 LEVEL.    Patient was given perioperative antibiotics:  Anti-infectives (From admission, onward)    Start     Dose/Rate Route Frequency Ordered Stop   08/28/24 1430  ceFAZolin  (ANCEF ) IVPB 2g/100 mL premix        2 g 200 mL/hr over 30 Minutes Intravenous Every 8 hours 08/28/24 1421 08/29/24 0746   08/28/24 1421  valACYclovir  (VALTREX ) tablet 1,000 mg  Status:  Discontinued        1,000 mg Oral As needed 08/28/24 1421 08/29/24 1911   08/28/24 0700  ceFAZolin  (ANCEF ) IVPB 2g/100 mL premix        2 g 200 mL/hr over 30 Minutes Intravenous On call to O.R. 08/28/24 9346 08/28/24 0910        Patient was given sequential compression devices, early ambulation to prevent DVT.  Patient benefited maximally from hospital stay and there were no complications.    Recent vital signs: BP (!) 103/52 (BP Location: Left Arm)   Pulse 70   Temp 97.7 F (36.5 C) (Oral)   Resp 16   Ht (P) 5' 3 (1.6 m)   Wt (P) 55.8 kg   SpO2 100%   BMI (P) 21.79 kg/m    Discharge Medications:   Allergies as of  08/29/2024   No Known Allergies      Medication List     TAKE these medications    acetaminophen  650 MG CR tablet Commonly known as: TYLENOL  Take 650 mg by mouth every 8 (eight) hours as needed for pain.   ascorbic acid 1000 MG tablet Commonly known as: VITAMIN C  Take 1,000 mg by mouth daily.   Azelastine -Fluticasone  137-50 MCG/ACT Susp Place 1 spray into the nose every 12 (twelve) hours.   Calcium  Carbonate-Vitamin D  600-200 MG-UNIT Tabs Take 1 tablet by mouth 2 (two) times daily.   Estradiol  10 MCG Tabs vaginal tablet 1 tablet per vagina daily What changed:  how much to take how to take this when to take this additional instructions   Flintstones + Extra Iron Chew Chew 1 each by mouth daily.   fluticasone  50 MCG/ACT nasal spray Commonly known as: FLONASE  Place 1 spray into both nostrils daily.   gabapentin  300 MG capsule Commonly known as: NEURONTIN  Take 2 capsules (600 mg total) by mouth 3 (three) times daily. What changed: how much to take   HYDROcodone -acetaminophen  5-325 MG tablet Commonly known as: NORCO/VICODIN Take 1-2 tablets by mouth every 6 (six) hours as needed for moderate pain (pain score 4-6) ((score 4 to 6)).   methocarbamol  500 MG tablet Commonly known as: ROBAXIN  Take 1 tablet (500 mg total) by mouth every 6 (six) hours as needed  for muscle spasms.   ONE-A-DAY WOMENS 50+ ADVANTAGE PO Take 1 capsule by mouth daily.   triamcinolone  ointment 0.1 % Commonly known as: KENALOG  Apply 1 Application topically daily as needed (rash).   valACYclovir  1000 MG tablet Commonly known as: VALTREX  Take 1 tablet (1,000 mg total) by mouth as needed. What changed: reasons to take this        Diagnostic Studies: DG Lumbar Spine 2-3 Views Result Date: 08/28/2024 CLINICAL DATA:  Elective surgery. EXAM: LUMBAR SPINE - 2-3 VIEW COMPARISON:  Preoperative imaging. FINDINGS: Two: Fluoroscopic spot views of the lumbar spine submitted from the operating room.  Posterior rod and pedicle screw fixation 3 contiguous levels with interbody spacers, tentatively L4 through S1, although S1 appears to be transitional, levels are not confidently delineated on these coned views. Fluoroscopy time 55 seconds. Dose 26.74 mGy. IMPRESSION: Intraoperative fluoroscopy during lumbar fusion. Electronically Signed   By: Andrea Gasman M.D.   On: 08/28/2024 13:29   DG Lumbar Spine 1 View Result Date: 08/28/2024 CLINICAL DATA:  Intraoperative imaging for localization. EXAM: LUMBAR SPINE - 1 VIEW COMPARISON:  Lumbar spine radiograph dated 07/22/2022. FINDINGS: Two localizer probes noted. The upper pole projects at the level of L2 and the lower pole at L4. IMPRESSION: Localizer probes at L2 and L4. Electronically Signed   By: Vanetta Chou M.D.   On: 08/28/2024 12:13   DG C-Arm 1-60 Min-No Report Result Date: 08/28/2024 Fluoroscopy was utilized by the requesting physician.  No radiographic interpretation.   DG C-Arm 1-60 Min-No Report Result Date: 08/28/2024 Fluoroscopy was utilized by the requesting physician.  No radiographic interpretation.   DG C-Arm 1-60 Min-No Report Result Date: 08/28/2024 Fluoroscopy was utilized by the requesting physician.  No radiographic interpretation.    Disposition: Discharge disposition: 01-Home or Self Care        POD #1 s/p L4/5 decompression and fusion, doing well   - up with PT/OT, encourage ambulation - Percocet for pain, Robaxin  for muscle spasms -Scripts for pain sent to pharmacy electronically  -D/C instructions sheet printed and in chart -D/C today  -F/U in office 2 weeks   Signed: Ileana PARAS Deshon Hsiao 09/04/2024, 10:19 AM       "

## 2024-09-13 ENCOUNTER — Ambulatory Visit

## 2024-09-13 ENCOUNTER — Other Ambulatory Visit: Payer: Self-pay

## 2024-09-13 ENCOUNTER — Other Ambulatory Visit: Payer: Self-pay | Admitting: Family Medicine

## 2024-09-13 VITALS — BP 102/64 | Ht 63.0 in | Wt 123.0 lb

## 2024-09-13 DIAGNOSIS — Z1231 Encounter for screening mammogram for malignant neoplasm of breast: Secondary | ICD-10-CM

## 2024-09-13 DIAGNOSIS — M1811 Unilateral primary osteoarthritis of first carpometacarpal joint, right hand: Secondary | ICD-10-CM

## 2024-09-13 MED ORDER — METHYLPREDNISOLONE ACETATE 40 MG/ML IJ SUSP
40.0000 mg | Freq: Once | INTRAMUSCULAR | Status: AC
  Administered 2024-09-13: 20 mg via INTRA_ARTICULAR

## 2024-09-13 NOTE — Progress Notes (Unsigned)
" ° °  Subjective:    Patient ID: Becky Bowers, female    DOB: 69 y.o., [birth year]   MRN: [id number]  Chief Complaint: Right CMC joint arthritis  History of Present Illness 69 year old female who just recently underwent back surgery presenting for right thumb pain.  She has well-documented CMC joint arthritis.  She has received several injections of corticosteroid into her [facility] joint under the care of Dr. Curtis.  Last of this was done on 03/21/2024.  Pain has returned since then.     Objective:   There were no vitals filed for this visit.  Right thumb: + CMC grind. TTP at [facility].  Right [facility] Joint Injection with Ultrasound Guidance [name] 22-Jun-1956 Indications: Pain Procedure Details Following the description of risks including infection bleeding, damage to surrounding structures, patient provided verbal/written consent for [facility] Joint injection procedure with ultrasound guidance. Ultrasound was used to identify the [facility] joint and was used to guide this procedure. Patient was sterilely prepped in the usual fashion with chlorhexidine . Sterile ultrasound gel was used for the procedure. Following topical anesthetization with ethyl chloride, the patient was injected into the joint with a solution of 20mg  Depo-Medrol  and 1cc 2% Mepivicaine. This was well visualized under ultrasound, please see associated photographic documentation. Patient tolerated well without complication. Precautions provided. Cleaned and dressing applied.   Assessment & Plan:   Assessment & Plan    "

## 2024-10-14 ENCOUNTER — Encounter: Admitting: Family Medicine

## 2024-10-24 ENCOUNTER — Ambulatory Visit
